# Patient Record
Sex: Male | Born: 1975 | Race: White | Hispanic: No | Smoking: Never smoker
Health system: Southern US, Community
[De-identification: ages and names within clinical notes are randomized; demographics above are authoritative.]

## PROBLEM LIST (undated history)

## (undated) DIAGNOSIS — K760 Fatty (change of) liver, not elsewhere classified: Secondary | ICD-10-CM

## (undated) DIAGNOSIS — G4730 Sleep apnea, unspecified: Secondary | ICD-10-CM

## (undated) DIAGNOSIS — F419 Anxiety disorder, unspecified: Secondary | ICD-10-CM

## (undated) DIAGNOSIS — G43909 Migraine, unspecified, not intractable, without status migrainosus: Secondary | ICD-10-CM

## (undated) DIAGNOSIS — J329 Chronic sinusitis, unspecified: Secondary | ICD-10-CM

## (undated) DIAGNOSIS — K219 Gastro-esophageal reflux disease without esophagitis: Secondary | ICD-10-CM

## (undated) DIAGNOSIS — M19019 Primary osteoarthritis, unspecified shoulder: Secondary | ICD-10-CM

## (undated) DIAGNOSIS — F329 Major depressive disorder, single episode, unspecified: Secondary | ICD-10-CM

## (undated) DIAGNOSIS — I10 Essential (primary) hypertension: Secondary | ICD-10-CM

## (undated) DIAGNOSIS — M7542 Impingement syndrome of left shoulder: Secondary | ICD-10-CM

## (undated) DIAGNOSIS — B019 Varicella without complication: Secondary | ICD-10-CM

## (undated) DIAGNOSIS — F32A Depression, unspecified: Secondary | ICD-10-CM

## (undated) HISTORY — DX: Varicella without complication: B01.9

## (undated) HISTORY — PX: APPENDECTOMY: SHX54

## (undated) HISTORY — PX: HYPOSPADIAS CORRECTION: SHX483

## (undated) HISTORY — DX: Gastro-esophageal reflux disease without esophagitis: K21.9

## (undated) HISTORY — DX: Migraine, unspecified, not intractable, without status migrainosus: G43.909

## (undated) HISTORY — PX: KNEE SURGERY: SHX244

## (undated) HISTORY — DX: Fatty (change of) liver, not elsewhere classified: K76.0

## (undated) HISTORY — PX: CERVICAL LAMINECTOMY: SHX94

## (undated) HISTORY — PX: CLAVICLE EXCISION: SHX597

---

## 2002-01-06 ENCOUNTER — Encounter: Payer: Self-pay | Admitting: Geriatric Medicine

## 2002-01-06 ENCOUNTER — Encounter: Admission: RE | Admit: 2002-01-06 | Discharge: 2002-01-06 | Payer: Self-pay | Admitting: Geriatric Medicine

## 2003-07-19 ENCOUNTER — Observation Stay (HOSPITAL_COMMUNITY): Admission: EM | Admit: 2003-07-19 | Discharge: 2003-07-20 | Payer: Self-pay | Admitting: Emergency Medicine

## 2003-07-19 ENCOUNTER — Encounter (INDEPENDENT_AMBULATORY_CARE_PROVIDER_SITE_OTHER): Payer: Self-pay | Admitting: Specialist

## 2005-10-16 ENCOUNTER — Emergency Department (HOSPITAL_COMMUNITY): Admission: EM | Admit: 2005-10-16 | Discharge: 2005-10-17 | Payer: Self-pay | Admitting: Emergency Medicine

## 2007-01-05 ENCOUNTER — Ambulatory Visit (HOSPITAL_BASED_OUTPATIENT_CLINIC_OR_DEPARTMENT_OTHER): Admission: RE | Admit: 2007-01-05 | Discharge: 2007-01-05 | Payer: Self-pay | Admitting: Orthopedic Surgery

## 2007-03-02 ENCOUNTER — Encounter: Admission: RE | Admit: 2007-03-02 | Discharge: 2007-03-02 | Payer: Self-pay | Admitting: Internal Medicine

## 2008-03-26 ENCOUNTER — Emergency Department (HOSPITAL_COMMUNITY): Admission: EM | Admit: 2008-03-26 | Discharge: 2008-03-26 | Payer: Self-pay | Admitting: Emergency Medicine

## 2009-08-21 ENCOUNTER — Inpatient Hospital Stay (HOSPITAL_COMMUNITY): Admission: EM | Admit: 2009-08-21 | Discharge: 2009-08-23 | Payer: Self-pay | Admitting: Emergency Medicine

## 2010-12-02 LAB — URINE MICROSCOPIC-ADD ON

## 2010-12-02 LAB — URINALYSIS, ROUTINE W REFLEX MICROSCOPIC
Glucose, UA: NEGATIVE mg/dL
Nitrite: NEGATIVE
Specific Gravity, Urine: 1.007 (ref 1.005–1.030)
pH: 6.5 (ref 5.0–8.0)

## 2010-12-02 LAB — CBC
HCT: 35.2 % — ABNORMAL LOW (ref 39.0–52.0)
Hemoglobin: 11.4 g/dL — ABNORMAL LOW (ref 13.0–17.0)
Hemoglobin: 12.1 g/dL — ABNORMAL LOW (ref 13.0–17.0)
Hemoglobin: 13.2 g/dL (ref 13.0–17.0)
MCHC: 33.8 g/dL (ref 30.0–36.0)
MCHC: 34 g/dL (ref 30.0–36.0)
MCHC: 34.8 g/dL (ref 30.0–36.0)
MCV: 88 fL (ref 78.0–100.0)
MCV: 89.1 fL (ref 78.0–100.0)
RBC: 3.77 MIL/uL — ABNORMAL LOW (ref 4.22–5.81)
RBC: 3.99 MIL/uL — ABNORMAL LOW (ref 4.22–5.81)
RBC: 4.43 MIL/uL (ref 4.22–5.81)
RDW: 12.9 % (ref 11.5–15.5)
WBC: 15.7 10*3/uL — ABNORMAL HIGH (ref 4.0–10.5)
WBC: 19.2 10*3/uL — ABNORMAL HIGH (ref 4.0–10.5)

## 2010-12-02 LAB — URINE CULTURE

## 2010-12-02 LAB — COMPREHENSIVE METABOLIC PANEL
ALT: 48 U/L (ref 0–53)
ALT: 55 U/L — ABNORMAL HIGH (ref 0–53)
AST: 26 U/L (ref 0–37)
Alkaline Phosphatase: 55 U/L (ref 39–117)
CO2: 23 mEq/L (ref 19–32)
CO2: 26 mEq/L (ref 19–32)
Calcium: 8.9 mg/dL (ref 8.4–10.5)
Chloride: 101 mEq/L (ref 96–112)
Chloride: 101 mEq/L (ref 96–112)
Creatinine, Ser: 0.78 mg/dL (ref 0.4–1.5)
GFR calc Af Amer: >60 mL/min (ref >60)
GFR calc non Af Amer: >60 mL/min (ref >60)
GFR calc non Af Amer: >60 mL/min (ref >60)
Glucose, Bld: 104 mg/dL — ABNORMAL HIGH (ref 70–99)
Glucose, Bld: 107 mg/dL — ABNORMAL HIGH (ref 70–99)
Potassium: 4 mEq/L (ref 3.5–5.1)
Sodium: 133 mEq/L — ABNORMAL LOW (ref 135–145)
Sodium: 136 mEq/L (ref 135–145)
Total Bilirubin: 0.9 mg/dL (ref 0.3–1.2)
Total Bilirubin: 0.9 mg/dL (ref 0.3–1.2)
Total Protein: 6.5 g/dL (ref 6.0–8.3)

## 2010-12-02 LAB — BASIC METABOLIC PANEL
CO2: 26 mEq/L (ref 19–32)
CO2: 27 mEq/L (ref 19–32)
Calcium: 8.7 mg/dL (ref 8.4–10.5)
Chloride: 102 mEq/L (ref 96–112)
Creatinine, Ser: 0.77 mg/dL (ref 0.4–1.5)
Creatinine, Ser: 0.86 mg/dL (ref 0.4–1.5)
GFR calc Af Amer: >60 mL/min (ref >60)
GFR calc Af Amer: >60 mL/min (ref >60)
Glucose, Bld: 114 mg/dL — ABNORMAL HIGH (ref 70–99)
Glucose, Bld: 97 mg/dL (ref 70–99)

## 2010-12-02 LAB — DIFFERENTIAL
Basophils Absolute: 0 10*3/uL (ref 0.0–0.1)
Basophils Absolute: 0 10*3/uL (ref 0.0–0.1)
Basophils Relative: 0 % (ref 0–1)
Eosinophils Absolute: 0 10*3/uL (ref 0.0–0.7)
Lymphs Abs: 1.7 10*3/uL (ref 0.7–4.0)
Neutro Abs: 16.7 10*3/uL — ABNORMAL HIGH (ref 1.7–7.7)
Neutro Abs: 3.6 10*3/uL (ref 1.7–7.7)
Neutrophils Relative %: 59 % (ref 43–77)

## 2010-12-02 LAB — CULTURE, BLOOD (ROUTINE X 2): Culture: NO GROWTH

## 2010-12-02 LAB — HEMOGLOBIN A1C: Hgb A1c MFr Bld: 5.5 % (ref 4.6–6.1)

## 2011-01-17 NOTE — Op Note (Signed)
NAME:  Joshua Parrish, Joshua Parrish                              ACCOUNT NO.:  [id number]   MEDICAL RECORD NO.:  [id number]                   PATIENT TYPE:  INP   LOCATION:  0105                                 FACILITY:  [facility]   PHYSICIAN:  Currie Paris, M.D.           DATE OF BIRTH:  1975-09-16   DATE OF PROCEDURE:  07/19/2003  DATE OF DISCHARGE:                                 OPERATIVE REPORT   PREOPERATIVE DIAGNOSIS:  Acute appendicitis.   POSTOPERATIVE DIAGNOSIS:  Acute appendicitis.   OPERATION:  Laparoscopic appendectomy.   SURGEON:  Currie Paris, M.D.   ANESTHESIA:  General.   CLINICAL HISTORY:  This patient is Parrish 35 year old with Parrish fairly typical  history for acute appendicitis as well as typical physical findings.   DESCRIPTION OF PROCEDURE:  The patient was brought to the operating room and  after satisfactory general anesthesia had been obtained, the abdomen was  prepped and draped.  An initial attempt at passing Parrish Foley catheter was  unsuccessful because of Parrish little bit of stricture in the distal urethra and  since he had had hypospadias repair, this issue was not pressed any further  and we did not further instrument the urinary tract.  I then, after prepping  and draping, the incisions were anesthetized with 0.25% plain Marcaine.  The  skin was opened at the umbilicus, the fascia identified and opened, and the  peritoneal cavity entered unde direct vision.  Parrish purse-string was placed,  the Hasson introduced, and the abdomen was insufflated to 15.  Parrish 5 mm trocar  was placed in the right upper quadrant and Parrish 10/11 in the left lower  quadrant, both under direct vision.   With the camera in the left lower quadrant, some instruments were used and I  was able to identify the appendix  which was inflamed and there was Parrish little  bit of, perhaps 5-10 mL, of purulent fluid just adjacent to it which was  suctioned out.  The appendix was opened up so the mesentery was on Parrish bit  of  Parrish stretch and the mesentery was divided with the Harmonic scalpel down to  the base of the appendix.  The base of the appendix was divided with the  endo-GIA and placed in Parrish bag.  The appendix was brought out through the  umbilical port.  The umbilical port was placed.  Final irrigation, check for  hemostasis was made and, again, everything appeared to be dry.  The 5 mm  port was removed first and there was no bleeding, the 10/11 in the left  lower quadrant next, and again, no bleeding.  The abdomen was desufflated  through the umbilical port and the purse-string was tied down.  The skin was  closed with 4-0 Monocryl subcuticular and Dermabond.  The patient tolerated  the procedure well.  There were no operative complications.  All counts were  correct.                                               Currie Paris, M.D.   CJS/MEDQ  D:  07/19/2003  T:  07/20/2003  Job:  [id number]   cc:   Jodene Nam C. Charma IgoP.

## 2011-01-17 NOTE — Op Note (Signed)
NAMETAVEON, ENYEART                    ACCOUNT NO.:  [id number]   MEDICAL RECORD NO.:  [id number]          PATIENT TYPE:  AMB   LOCATION:  DSC                          FACILITY:  [facility]   PHYSICIAN:  Katy Fitch. Sypher, M.D. DATE OF BIRTH:  December 28, 1975   DATE OF PROCEDURE:  01/05/2007  DATE OF DISCHARGE:                               OPERATIVE REPORT   PREOPERATIVE DIAGNOSIS:  Chronic pain, right shoulder, with a history of  overhead the lifting and prior history of weight training, with evidence  of advanced acromioclavicular joint arthropathy, with hypertrophic  osteophyte formation on plain x-ray, signs of stage II impingement  clinically, and an MRI documenting inflammation of the acromioclavicular  joint, unfavorable acromioclavicular joint anatomy and reactive edema in  the humeral head, all consistent with stage II impingement.   POSTOPERATIVE DIAGNOSIS:  Chronic pain, right shoulder, with a history  of overhead the lifting and prior history of weight training, with  evidence of advanced acromioclavicular joint arthropathy, with  hypertrophic osteophyte formation on plain x-ray, signs of stage II  impingement clinically, and an MRI documenting inflammation of the  acromioclavicular joint, unfavorable acromioclavicular joint anatomy and  reactive edema in the humeral head, all consistent with stage II  impingement.  Identification of significant arthropathy of the right  acromioclavicular joint with hypertrophic osteophyte formation.   OPERATION:  1. Examination of right shoulder under anesthesia.  2. Diagnostic arthroscopy, right glenohumeral joint, confirming an      intact labrum, capsule ligamentous structures, biceps and articular      surface rotator cuff.  3. Arthroscopic subacromial decompression with bursectomy,      coracoacromial ligament relaxation, anterior and medial      acromioplasty with extensive bursectomy.  4. Open resection of distal clavicle and  acromioclavicular joint      degenerative meniscus.   OPERATING SURGEON:  Katy Fitch. Sypher, MD.  No assistant.   ANESTHESIA:  General endotracheal supplemented by a right interscalene  block, supervising anesthesiologist is Dr. Sampson Goon.   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   He had a history of extensive weight training in the past as well as  other vigorous contact sports.  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   On Parrish examination in the office he was noted to have signs of AC  arthropathy and impingement.  He had weakness of abduction that may have  been pain-induced.  Plain x-rays of the shoulder documented very  unfavorable hypertrophic osteoarthritis of the [facility] joint with inferior  projecting osteophytes at the medial acromion and distal clavicle.  An  MRI of the shoulder was obtained, which revealed tendinosis of the  supraspinatus rotator cuff tendon, no sign of significant labral  pathology, and AC edema.  There was also some edema in the humeral head  consistent with an impingement stress phenomenon.  Due to a failure to respond to rest, activity modification and therapy,  Joshua Parrish was advised to proceed with diagnostic arthroscopy at this time  in order to fully assess his glenohumeral joint, rotator cuff, and to  proceed with subacromial decompression.   Given his overhead work and size, I recommended open distal clavicle  resection so that we may properly repair the trapezius and deltoid  muscles.   After informed consent, he is brought to the operating room at this  time.   Preoperatively he was advised of the potential risks and benefits of  surgery.  He understands the risks include infection, failure to  relieve  all his pain, and a failure to restore his ability to handle highly  physical contact sports.  His benefits anticipated would include pain  relief at the [facility] joint, relief of impingement and improved sleep.   Questions were invited and answered both preoperatively in the office  and in the holding area with his wife and mother present.   PROCEDURE:  Joshua Parrish was brought to the operating room and placed in  supine position on the operating table.   Following an anesthesia consult with Dr. Sampson Goon, an interscalene  block was recommended and placed without complication.  Excellent  anesthesia of the forequarter was obtained.   Joshua Parrish was transferred to room #1, placed in supine position on the  operating table, and under Dr. Jarrett Ables direct supervision, general  endotracheal anesthesia induced.   He was carefully positioned in the beach-chair position with the aid of  a torso and holder with passive compression devices on his calves.   Examination of the shoulder under anesthesia revealed that he had no  sign of instability of the glenohumeral joint to anterior, posterior,  inferior or traction stress.  He had crepitation in the sub-AC region  consistent with chronic bursitis and impingement.  The right arm and  forequarter were then prepped with DuraPrep and he was draped with  impervious arthroscopy drapes.  The shoulder was instrumented through a  standard posterior viewing portal with a blunt trocar.  Diagnostic  arthroscopy revealed intact hyaline articular cartilage surfaces on the  glenoid and humeral head.  The anterior capsule had mild adhesive  capsulitis granulations.  The subscapularis, supraspinatus,  infraspinatus and teres minor tendons had a normal articular side  insertion.  The labrum was intact.  The biceps origin was intact and the  biceps tendon was normal through the rotator interval.  The scope was removed from the glenohumeral joint and  placed in the subacromial space.  There was a florid bursitis noted.  After bursectomy, the anatomy of the  coracoacromial arch was studied.  The coracoacromial ligament was quite  thickened and there was a hypertrophic bursitis noted both on the tendon  and acromial side.  After thorough bursectomy, the coracoacromial  ligament was relaxed with cutting cautery followed by leveling the  acromion to a type 1 morphology with a suction bur.  Hemostasis was  achieved with the bipolar electrocautery device.  The medial acromion  was quite prominent, particularly posteriorly.  This was leveled to a  type 1 morphology with a suction bur.  The capsule of the [facility] joint was  released with cutting cautery posteriorly, inferiorly and anteriorly.  Thereafter, we proceeded to an open distal clavicle resection.  A 2.5 cm  incision was fashioned directly over the distal clavicle.  The  subcutaneous tissues were carefully divided taking care to identify the  capsule of the [facility] joint.  The distal 2 cm of clavicle was exposed  subperiosteally, followed by use of an oscillating saw to remove the  distal clavicle.  There was a substantial posterior inferior osteophyte  that remained that was removed with the oscillating saw, a curette and  rongeur.   After completion of the [facility] resection, the meniscus remnants at the [facility]  joint were removed as well.   The dead space created by the distal clavicle resection was then closed  with a pair of figure-of-eight mattress sutures of #2 FiberWire with  knots buried deep.  The skin wound was then repaired with subdermal  sutures of 0 Vicryl and intradermal 3-0 Prolene with Steri-Strips.  The  scope was replaced in the subacromial space and clot and debris removed.  Care was taken to assure that all capsular remnants were debrided and  hemostasis achieved.  A photographic image of the final decompression  was obtained.   Mr. Burress's wounds were then repaired with mattress  sutures of 3-0  Prolene, followed by Steri-Strips.  A voluminous gauze dressing with ABD  pads and paper tape was applied.   Mr. Chandley was awakened from general anesthesia and transferred to the  recovery room with stable vital signs.   He will be discharged home care of his family with intention to return  to our office for follow-up in 24 hours for x-ray, dressing change, and  initiation of his active assisted range of motion exercise program.    Note:  For aftercare he is provided a prescription for Dilaudid 2 mg one  or two tablets p.o. a.4-6h. p.r.n. pain, 40 tablets without refill, also  Motrin 600 mg one p.o. q.6h. p.r.n. pain, 40 tablets with two refills,  and Keflex 500 mg one p.o. q.8h. x3 days as a prophylactic antibiotic.      Katy Fitch Sypher, M.D.  Electronically Signed     RVS/MEDQ  D:  01/05/2007  T:  01/05/2007  Job:  [id number]

## 2011-01-17 NOTE — H&P (Signed)
NAME:  Joshua Parrish, Joshua Parrish                              ACCOUNT NO.:  [id number]   MEDICAL RECORD NO.:  [id number]                   PATIENT TYPE:  INP   LOCATION:  0451                                 FACILITY:  [facility]   PHYSICIAN:  Currie Paris, M.D.           DATE OF BIRTH:  May 06, 1976   DATE OF ADMISSION:  07/19/2003  DATE OF DISCHARGE:                                HISTORY & PHYSICAL   CHIEF COMPLAINT:  Abdomina pain.   HISTORY OF PRESENT ILLNESS:  Joshua Parrish is a 35 year old, previously healthy  gentleman who presents today with a one day history of abdominal pain.  He  had a little diarrhea last night which he did not think much of, and then  this morning woke up about six with mid abdominal pain which he felt was  indigestion, but it got significantly worse over the day and moved into his  right lower quadrant and has persisted there.  Pain is increased by moving  around, made better by laying still.  He has been nauseated, vomited a  couple times today, but not nauseated currently, but is not hungry either.  He is fairly uncomfortable from the pain.  He has never had any pain similar  to this before.  He has had no other recent GI complaints, nor has he had  any other GI or GU symptoms currently.  He does note that he apparently had  some inflammation of his pancreas about a year ago but did not require  hospitalization.   MEDICATIONS:  None.   ALLERGIES:  1. NEMBUTAL and CHLORAL HYDRATE cause him to act crazy.  2. CODEINE gives him nausea.   PAST SURGICAL HISTORY:  1. He has had knee surgery as a child.  2. Hypospadias repair.   SOCIAL HISTORY:  He does not smoke but does dip, drinks rare alcohol.  He  works as a Contractor.   FAMILY HISTORY:  He has sporadic incidents of cancer and heart disease in  the family.   REVIEW OF SYSTEMS:  HEENT:  Negative.  CHEST:  No cough, shortness of  breath.  HEART:  No history of murmurs or hypertension.  ABDOMEN:   Negative  except for as per HPI.  GENITOURINARY:  Negative except for HPI.  He has no  voiding symptoms following his hypospadias repair.  EXTREMITIES:  Has had  surgeries there but otherwise doing well.   PHYSICAL EXAMINATION:  GENERAL:  The patient is generally alert,  uncomfortable, but not toxic-appearing.  He is overweight.  VITAL SIGNS:  Blood pressure 145/91, pulse 71, respirations 18, temperature  98.7 here.  It was 99.5 in Dr. Newell Coral office.  HEENT:  Head is normocephalic.  Eyes nonicteric.  Pupils equal, round, and  regular. EOMs intact.  Pharynx:  A little bit dry mucous membranes.  NECK:  Supple.  There are no masses or thyromegaly.  LUNGS:  Normal respirations.  Clear to auscultation.  HEART:  Regular rhythm.  No murmurs, rubs, or gallops are heard.  Intact  carotid femoral pulses.  ABDOMEN:  A little distended, markedly tender with guarding in the right  lower quadrant.  Some referred rebound from the left side to the right side.  Bowel sounds are present but hypoactive.  No masses are noted.  No  tenderness or organomegaly are noted.  EXTREMITIES:  Show no cyanosis or edema.  He has good range of motion.  No  significant varicosities are noted.   LABORATORY DATA:  His white count is 10,600 with a marked left shift of 90%  neutrophils.  CMET is basically negative.  Amylase, lipase are normal.   IMPRESSION:  Acute appendicitis.   PLAN:  Discussed the plans with the patient and his family.  I would like to  go ahead with laparoscopy and planned laparoscopic appendectomy this  afternoon or early evening when the operating room is available.  I have  gone over risks and complications and have also talked about the fact that  the diagnosis could be something else and that we may need to do him as an  open procedure.  I think all questions have been answered and will go ahead  and start him on some antibiotics and some pain medications, but he has  already been getting  intravenous fluids.                                               Currie Paris, M.D.    CJS/MEDQ  D:  07/19/2003  T:  07/19/2003  Job:  [id number]

## 2013-04-01 ENCOUNTER — Emergency Department (HOSPITAL_COMMUNITY): Payer: No Typology Code available for payment source

## 2013-04-01 ENCOUNTER — Encounter (HOSPITAL_COMMUNITY): Payer: Self-pay | Admitting: Emergency Medicine

## 2013-04-01 ENCOUNTER — Emergency Department (HOSPITAL_COMMUNITY)
Admission: EM | Admit: 2013-04-01 | Discharge: 2013-04-01 | Disposition: A | Discharge status: Home / Self Care | Payer: No Typology Code available for payment source | Attending: Emergency Medicine | Admitting: Emergency Medicine

## 2013-04-01 DIAGNOSIS — R110 Nausea: Secondary | ICD-10-CM | POA: Insufficient documentation

## 2013-04-01 DIAGNOSIS — T148XXA Other injury of unspecified body region, initial encounter: Secondary | ICD-10-CM

## 2013-04-01 DIAGNOSIS — S139XXA Sprain of joints and ligaments of unspecified parts of neck, initial encounter: Secondary | ICD-10-CM | POA: Insufficient documentation

## 2013-04-01 DIAGNOSIS — S060X0A Concussion without loss of consciousness, initial encounter: Secondary | ICD-10-CM | POA: Insufficient documentation

## 2013-04-01 DIAGNOSIS — Y92410 Unspecified street and highway as the place of occurrence of the external cause: Secondary | ICD-10-CM | POA: Insufficient documentation

## 2013-04-01 DIAGNOSIS — Y9389 Activity, other specified: Secondary | ICD-10-CM | POA: Insufficient documentation

## 2013-04-01 MED ORDER — HYDROCODONE-ACETAMINOPHEN 5-325 MG PO TABS: 1.0000 | ORAL_TABLET | Freq: Four times a day (QID) | ORAL | Status: DC | PRN

## 2013-04-01 MED ORDER — ONDANSETRON 4 MG PO TBDP
4.0000 mg | ORAL_TABLET | Freq: Once | ORAL | Status: AC
  Administered 2013-04-01: 4 mg via ORAL
  Filled 2013-04-01: qty 1

## 2013-04-01 MED ORDER — CYCLOBENZAPRINE HCL 10 MG PO TABS: 10.0000 mg | ORAL_TABLET | Freq: Two times a day (BID) | ORAL | Status: DC | PRN
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
## 2013-04-01 NOTE — ED Notes (Signed)
MD at bedside. 

## 2013-04-01 NOTE — Progress Notes (Signed)
Patient cofirms he does not have a pcp.  Patient also reports he has Advertising copywriter for [employer]  Instructed patient to call the number of the back of his insurance card or go under insurance company website to find a pcp who is within network.  Patient verbalized understanding.

## 2013-04-01 NOTE — ED Notes (Signed)
Patient transported to CT 
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
## 2013-08-18 ENCOUNTER — Other Ambulatory Visit: Payer: Self-pay | Admitting: Orthopedic Surgery

## 2013-08-18 DIAGNOSIS — M25512 Pain in left shoulder: Secondary | ICD-10-CM

## 2013-08-31 ENCOUNTER — Other Ambulatory Visit: Payer: Self-pay

## 2014-12-18 ENCOUNTER — Emergency Department (HOSPITAL_COMMUNITY)
Admission: EM | Admit: 2014-12-18 | Discharge: 2014-12-18 | Disposition: A | Discharge status: Home / Self Care | Payer: 59 | Attending: Emergency Medicine | Admitting: Emergency Medicine

## 2014-12-18 ENCOUNTER — Encounter (HOSPITAL_COMMUNITY): Payer: Self-pay | Admitting: Emergency Medicine

## 2014-12-18 DIAGNOSIS — M5412 Radiculopathy, cervical region: Secondary | ICD-10-CM | POA: Diagnosis not present

## 2014-12-18 DIAGNOSIS — M62838 Other muscle spasm: Secondary | ICD-10-CM | POA: Insufficient documentation

## 2014-12-18 DIAGNOSIS — M436 Torticollis: Secondary | ICD-10-CM | POA: Diagnosis not present

## 2014-12-18 DIAGNOSIS — M79602 Pain in left arm: Secondary | ICD-10-CM | POA: Diagnosis present

## 2014-12-18 DIAGNOSIS — Z79899 Other long term (current) drug therapy: Secondary | ICD-10-CM | POA: Insufficient documentation

## 2014-12-18 MED ORDER — HYDROMORPHONE HCL 1 MG/ML IJ SOLN
1.0000 mg | Freq: Once | INTRAMUSCULAR | Status: AC
  Administered 2014-12-18: 1 mg via INTRAMUSCULAR
  Filled 2014-12-18: qty 1

## 2014-12-18 MED ORDER — KETOROLAC TROMETHAMINE 60 MG/2ML IM SOLN
30.0000 mg | Freq: Once | INTRAMUSCULAR | Status: AC
  Administered 2014-12-18: 30 mg via INTRAMUSCULAR
  Filled 2014-12-18: qty 2

## 2014-12-18 MED ORDER — DIAZEPAM 5 MG PO TABS: 5.0000 mg | ORAL_TABLET | Freq: Four times a day (QID) | ORAL | Status: DC | PRN

## 2014-12-18 NOTE — ED Provider Notes (Addendum)
CSN: [id number]     Arrival date & time 12/18/14  0048 History   First MD Initiated Contact with Patient 12/18/14 0100     Chief Complaint  Patient presents with  . Arm Pain     (Consider location/radiation/quality/duration/timing/severity/associated sxs/prior Treatment) Patient is a 39 y.o. male presenting with neck injury.  Neck Injury This is a new problem. The current episode started less than 1 hour ago. The problem occurs constantly. The problem has been gradually worsening. Pertinent negatives include no chest pain, no abdominal pain, no headaches and no shortness of breath. Exacerbated by: movement of neck and palpation. Nothing relieves the symptoms. He has tried nothing for the symptoms.    History reviewed. No pertinent past medical history. Past Surgical History  Procedure Laterality Date  . Clavicle excision    . Appendectomy    . Knee surgery     Family History  Problem Relation Age of Onset  . Cancer Other   . Hypertension Other   . Diabetes Other    History  Substance Use Topics  . Smoking status: Never Smoker   . Smokeless tobacco: Current User    Types: Chew  . Alcohol Use: Yes     Comment: occasional    Review of Systems  Respiratory: Negative for shortness of breath.   Cardiovascular: Negative for chest pain.  Gastrointestinal: Negative for abdominal pain.  Neurological: Negative for headaches.  All other systems reviewed and are negative.     Allergies  Review of patient's allergies indicates no known allergies.  Home Medications   Prior to Admission medications   Medication Sig Start Date End Date Taking? Authorizing Provider  ibuprofen (ADVIL,MOTRIN) 200 MG tablet Take 800 mg by mouth every 6 (six) hours as needed for moderate pain.   Yes Historical Provider, MD  cyclobenzaprine (FLEXERIL) 10 MG tablet Take 1 tablet (10 mg total) by mouth 2 (two) times daily as needed for muscle spasms. Patient not taking: Reported on 12/18/2014 04/01/13    Blanchie Dessert, MD  diazepam (VALIUM) 5 MG tablet Take 1 tablet (5 mg total) by mouth every 6 (six) hours as needed (spasms). 12/18/14   Debby Freiberg, MD  HYDROcodone-acetaminophen (NORCO/VICODIN) 5-325 MG per tablet Take 1 tablet by mouth every 6 (six) hours as needed for pain. Patient not taking: Reported on 12/18/2014 04/01/13   Blanchie Dessert, MD   BP 162/94 mmHg  Pulse 90  Temp(Src) 97.8 F (36.6 C) (Oral)  Resp 18  SpO2 99% Physical Exam  Constitutional: He is oriented to person, place, and time. He appears well-developed and well-nourished.  HENT:  Head: Normocephalic and atraumatic.  Eyes: Conjunctivae and EOM are normal.  Neck: Normal range of motion. Neck supple.  Cardiovascular: Normal rate, regular rhythm and normal heart sounds.   Pulmonary/Chest: Effort normal and breath sounds normal. No respiratory distress.  Abdominal: He exhibits no distension. There is no tenderness. There is no rebound and no guarding.  Musculoskeletal:       Cervical back: He exhibits decreased range of motion (2/2 pain), tenderness and spasm (L). He exhibits no bony tenderness.  Neurological: He is alert and oriented to person, place, and time.  Strength 5/5 bil ue, sensation subj diminished, objectively intact  Skin: Skin is warm and dry.  Vitals reviewed.   ED Course  Procedures (including critical care time) Labs Review Labs Reviewed - No data to display  Imaging Review No results found.   EKG Interpretation None      MDM  Final diagnoses:  Cervical radiculopathy  Torticollis    39 y.o. male with pertinent PMH of 2 months of L sided neck pain and progressive weakness on that side presents with acute on chronic L neck pain radiating to L arm after lifting boxes.  No objective neuro deficit, symptoms and exam consistent with torticollis and cervical radiculopathy.  No indication for imaging.  DC home in stable condition to fu with previously scheduled orthopedist appointment  (also does spine).    I have reviewed all laboratory and imaging studies if ordered as above  1. Cervical radiculopathy   2. Torticollis          Debby Freiberg, MD 12/18/14 [phone]

## 2014-12-18 NOTE — Discharge Instructions (Signed)

## 2014-12-18 NOTE — ED Notes (Signed)
Pt is c/o left arm pain  Pt states the pain is in the left side of his neck, shoulder, and arm  Pt states it hurts to raise his arm

## 2015-04-02 ENCOUNTER — Other Ambulatory Visit: Payer: Self-pay | Admitting: Orthopedic Surgery

## 2015-04-02 DIAGNOSIS — M25511 Pain in right shoulder: Secondary | ICD-10-CM

## 2015-04-18 ENCOUNTER — Ambulatory Visit
Admission: RE | Admit: 2015-04-18 | Discharge: 2015-04-18 | Disposition: A | Discharge status: Home / Self Care | Payer: 59 | Source: Ambulatory Visit | Attending: Orthopedic Surgery | Admitting: Orthopedic Surgery

## 2015-04-18 ENCOUNTER — Other Ambulatory Visit: Payer: Self-pay | Admitting: Orthopedic Surgery

## 2015-04-18 DIAGNOSIS — M25512 Pain in left shoulder: Secondary | ICD-10-CM

## 2015-04-18 DIAGNOSIS — M25511 Pain in right shoulder: Secondary | ICD-10-CM

## 2015-04-18 MED ORDER — IOHEXOL 180 MG/ML  SOLN
13.0000 mL | Freq: Once | INTRAMUSCULAR | Status: DC | PRN
  Administered 2015-04-18: 13 mL via INTRAVENOUS

## 2015-06-19 ENCOUNTER — Encounter (HOSPITAL_BASED_OUTPATIENT_CLINIC_OR_DEPARTMENT_OTHER): Payer: Self-pay | Admitting: Physician Assistant

## 2015-06-19 DIAGNOSIS — M19019 Primary osteoarthritis, unspecified shoulder: Secondary | ICD-10-CM | POA: Diagnosis present

## 2015-06-19 DIAGNOSIS — M7542 Impingement syndrome of left shoulder: Secondary | ICD-10-CM

## 2015-06-19 HISTORY — DX: Impingement syndrome of left shoulder: M75.42

## 2015-06-19 NOTE — H&P (Signed)
Joshua Parrish is an 39 y.o. male.   Chief Complaint: LEFT SHOULDER PAIN HPI: Joshua Parrish is a 39 year old who is seen for a follow-up evaluation for his persistent left shoulder pain. This has been bothering him for the past year. He underwent an MRI arthrogram for the left shoulder that showed rotator cuff tendinitis with impingement. He has significant AC joint spurring noted on X-rays.  We injected his shoulder with minimal relief and placed him on a Sterapred Dosepak with minimal relief. He has been to physical therapy with minimal relief.   No past medical history on file.  Past Surgical History  Procedure Laterality Date  . Clavicle excision    . Appendectomy    . Knee surgery      Family History  Problem Relation Age of Onset  . Cancer Other   . Hypertension Other   . Diabetes Other    Social History:  reports that he has never smoked. His smokeless tobacco use includes Chew. He reports that he drinks alcohol. He reports that he does not use illicit drugs.  Allergies: No Known Allergies  No current facility-administered medications for this encounter.  Current outpatient prescriptions:  .  cyclobenzaprine (FLEXERIL) 10 MG tablet, Take 1 tablet (10 mg total) by mouth 2 (two) times daily as needed for muscle spasms. (Patient not taking: Reported on 12/18/2014), Disp: 20 tablet, Rfl: 0 .  diazepam (VALIUM) 5 MG tablet, Take 1 tablet (5 mg total) by mouth every 6 (six) hours as needed (spasms)., Disp: 20 tablet, Rfl: 0 .  HYDROcodone-acetaminophen (NORCO/VICODIN) 5-325 MG per tablet, Take 1 tablet by mouth every 6 (six) hours as needed for pain. (Patient not taking: Reported on 12/18/2014), Disp: 10 tablet, Rfl: 0 .  ibuprofen (ADVIL,MOTRIN) 200 MG tablet, Take 800 mg by mouth every 6 (six) hours as needed for moderate pain., Disp: , Rfl:    Review of Systems  Constitutional: Negative.   HENT: Negative.   Eyes: Negative.   Respiratory: Negative.   Cardiovascular: Negative.    Gastrointestinal: Negative.   Genitourinary: Negative.   Musculoskeletal: Positive for joint pain.       LEFT SHOULDER PAIN  Skin: Negative.   Neurological: Negative.   Endo/Heme/Allergies: Negative.   Psychiatric/Behavioral: Negative.     There were no vitals taken for this visit. Physical Exam  Constitutional: He is oriented to person, place, and time. He appears well-developed and well-nourished.  HENT:  Head: Normocephalic and atraumatic.  Mouth/Throat: Oropharynx is clear and moist.  Eyes: Conjunctivae are normal. Pupils are equal, round, and reactive to light.  Neck: Neck supple.  Cardiovascular: Normal rate.   Respiratory: Effort normal.  GI: Soft.  Genitourinary:  Not pertinent to current symptomatology therefore not examined.  Musculoskeletal:  Examination of his left shoulder reveals a full range of motion with pain. There is a positive impingement. There is pain on rotator cuff stressing with minimal weakness. Positive O'Brien's test.  Examination of his right shoulder reveals a full range of motion without pain, swelling, weakness or instability. Vascular exam: pulses   Neurological: He is alert and oriented to person, place, and time.  Skin: Skin is warm and dry.  Psychiatric: He has a normal mood and affect. His behavior is normal.     Assessment Principal Problem:   Rotator cuff impingement syndrome of left shoulder Active Problems:   Acromioclavicular joint arthritis   Plan At this point due to his failed conservative care, we would recommend we proceed  with a left shoulder arthroscopy with a subacromial decompression and distal clavicle excision. The risks, complications and benefits of the surgery have been described to him in detail and he understands this completely.   Sully Manzi J 06/19/2015, 9:33 AM

## 2015-06-20 ENCOUNTER — Encounter (HOSPITAL_BASED_OUTPATIENT_CLINIC_OR_DEPARTMENT_OTHER): Payer: Self-pay | Admitting: *Deleted

## 2015-06-26 ENCOUNTER — Ambulatory Visit (HOSPITAL_BASED_OUTPATIENT_CLINIC_OR_DEPARTMENT_OTHER): Payer: 59 | Admitting: Certified Registered"

## 2015-06-26 ENCOUNTER — Encounter (HOSPITAL_BASED_OUTPATIENT_CLINIC_OR_DEPARTMENT_OTHER)
Admission: RE | Disposition: A | Discharge status: Home / Self Care | Payer: Self-pay | Source: Ambulatory Visit | Attending: Orthopedic Surgery

## 2015-06-26 ENCOUNTER — Ambulatory Visit (HOSPITAL_BASED_OUTPATIENT_CLINIC_OR_DEPARTMENT_OTHER)
Admission: RE | Admit: 2015-06-26 | Discharge: 2015-06-26 | Disposition: A | Discharge status: Home / Self Care | Payer: 59 | Source: Ambulatory Visit | Attending: Orthopedic Surgery | Admitting: Orthopedic Surgery

## 2015-06-26 ENCOUNTER — Encounter (HOSPITAL_BASED_OUTPATIENT_CLINIC_OR_DEPARTMENT_OTHER): Payer: Self-pay

## 2015-06-26 DIAGNOSIS — M75112 Incomplete rotator cuff tear or rupture of left shoulder, not specified as traumatic: Secondary | ICD-10-CM | POA: Diagnosis not present

## 2015-06-26 DIAGNOSIS — M19012 Primary osteoarthritis, left shoulder: Secondary | ICD-10-CM | POA: Diagnosis not present

## 2015-06-26 DIAGNOSIS — M19019 Primary osteoarthritis, unspecified shoulder: Secondary | ICD-10-CM | POA: Diagnosis present

## 2015-06-26 DIAGNOSIS — Z720 Tobacco use: Secondary | ICD-10-CM | POA: Insufficient documentation

## 2015-06-26 DIAGNOSIS — M7542 Impingement syndrome of left shoulder: Secondary | ICD-10-CM | POA: Diagnosis present

## 2015-06-26 HISTORY — DX: Depression, unspecified: F32.A

## 2015-06-26 HISTORY — DX: Major depressive disorder, single episode, unspecified: F32.9

## 2015-06-26 HISTORY — DX: Primary osteoarthritis, unspecified shoulder: M19.019

## 2015-06-26 HISTORY — DX: Impingement syndrome of left shoulder: M75.42

## 2015-06-26 HISTORY — PX: SHOULDER ARTHROSCOPY WITH ROTATOR CUFF REPAIR: SHX5685

## 2015-06-26 SURGERY — SHOULDER ARTHROSCOPY WITH SUBACROMIAL DECOMPRESSION AND DISTAL CLAVICLE EXCISION
Anesthesia: General | Site: Shoulder | Laterality: Left

## 2015-06-26 MED ORDER — OXYCODONE HCL 5 MG PO TABS: ORAL_TABLET | ORAL | Status: DC

## 2015-06-26 MED ORDER — EPINEPHRINE HCL 1 MG/ML IJ SOLN
INTRAMUSCULAR | Status: AC
  Filled 2015-06-26: qty 1

## 2015-06-26 MED ORDER — BUPIVACAINE-EPINEPHRINE (PF) 0.25% -1:200000 IJ SOLN
INTRAMUSCULAR | Status: AC
  Filled 2015-06-26: qty 30

## 2015-06-26 MED ORDER — MIDAZOLAM HCL 2 MG/2ML IJ SOLN
INTRAMUSCULAR | Status: AC
  Filled 2015-06-26: qty 2

## 2015-06-26 MED ORDER — SCOPOLAMINE 1 MG/3DAYS TD PT72: 1.0000 | MEDICATED_PATCH | Freq: Once | TRANSDERMAL | Status: DC | PRN

## 2015-06-26 MED ORDER — LIDOCAINE HCL (CARDIAC) 20 MG/ML IV SOLN
INTRAVENOUS | Status: AC
  Filled 2015-06-26: qty 5

## 2015-06-26 MED ORDER — SODIUM CHLORIDE 0.9 % IR SOLN
Status: DC | PRN
  Administered 2015-06-26: 11000 mL

## 2015-06-26 MED ORDER — ARTIFICIAL TEARS OP OINT
TOPICAL_OINTMENT | OPHTHALMIC | Status: AC
  Filled 2015-06-26: qty 3.5

## 2015-06-26 MED ORDER — PROPOFOL 500 MG/50ML IV EMUL
INTRAVENOUS | Status: AC
  Filled 2015-06-26: qty 50

## 2015-06-26 MED ORDER — SUCCINYLCHOLINE CHLORIDE 20 MG/ML IJ SOLN
INTRAMUSCULAR | Status: AC
  Filled 2015-06-26: qty 1

## 2015-06-26 MED ORDER — ONDANSETRON HCL 4 MG/2ML IJ SOLN
INTRAMUSCULAR | Status: DC | PRN
  Administered 2015-06-26: 4 mg via INTRAVENOUS

## 2015-06-26 MED ORDER — SUCCINYLCHOLINE CHLORIDE 20 MG/ML IJ SOLN
INTRAMUSCULAR | Status: DC | PRN
  Administered 2015-06-26: 140 mg via INTRAVENOUS

## 2015-06-26 MED ORDER — ONDANSETRON HCL 4 MG/2ML IJ SOLN: 4.0000 mg | Freq: Once | INTRAMUSCULAR | Status: DC | PRN

## 2015-06-26 MED ORDER — LACTATED RINGERS IV SOLN
INTRAVENOUS | Status: DC
  Administered 2015-06-26 (×2): via INTRAVENOUS

## 2015-06-26 MED ORDER — MEPERIDINE HCL 25 MG/ML IJ SOLN: 6.2500 mg | INTRAMUSCULAR | Status: DC | PRN

## 2015-06-26 MED ORDER — MIDAZOLAM HCL 2 MG/2ML IJ SOLN
1.0000 mg | INTRAMUSCULAR | Status: DC | PRN
  Administered 2015-06-26: 2 mg via INTRAVENOUS

## 2015-06-26 MED ORDER — GLYCOPYRROLATE 0.2 MG/ML IJ SOLN: 0.2000 mg | Freq: Once | INTRAMUSCULAR | Status: DC | PRN

## 2015-06-26 MED ORDER — LIDOCAINE HCL 4 % MT SOLN
OROMUCOSAL | Status: DC | PRN
  Administered 2015-06-26: 2 mL via TOPICAL

## 2015-06-26 MED ORDER — FENTANYL CITRATE (PF) 100 MCG/2ML IJ SOLN
50.0000 ug | INTRAMUSCULAR | Status: DC | PRN
  Administered 2015-06-26: 100 ug via INTRAVENOUS

## 2015-06-26 MED ORDER — BUPIVACAINE-EPINEPHRINE (PF) 0.5% -1:200000 IJ SOLN
INTRAMUSCULAR | Status: DC | PRN
  Administered 2015-06-26: 30 mL via PERINEURAL

## 2015-06-26 MED ORDER — HYDROMORPHONE HCL 1 MG/ML IJ SOLN: 0.2500 mg | INTRAMUSCULAR | Status: DC | PRN

## 2015-06-26 MED ORDER — FENTANYL CITRATE (PF) 100 MCG/2ML IJ SOLN
INTRAMUSCULAR | Status: AC
  Filled 2015-06-26: qty 2

## 2015-06-26 MED ORDER — PROPOFOL 10 MG/ML IV BOLUS
INTRAVENOUS | Status: DC | PRN
  Administered 2015-06-26: 200 mg via INTRAVENOUS

## 2015-06-26 MED ORDER — DEXAMETHASONE SODIUM PHOSPHATE 4 MG/ML IJ SOLN
INTRAMUSCULAR | Status: DC | PRN
  Administered 2015-06-26: 10 mg via INTRAVENOUS

## 2015-06-26 MED ORDER — DIAZEPAM 5 MG PO TABS: 5.0000 mg | ORAL_TABLET | Freq: Four times a day (QID) | ORAL | Status: DC | PRN

## 2015-06-26 MED ORDER — LIDOCAINE HCL (CARDIAC) 20 MG/ML IV SOLN
INTRAVENOUS | Status: DC | PRN
  Administered 2015-06-26: 100 mg via INTRAVENOUS

## 2015-06-26 MED ORDER — CHLORHEXIDINE GLUCONATE 4 % EX LIQD: 60.0000 mL | Freq: Once | CUTANEOUS | Status: DC

## 2015-06-26 MED ORDER — DEXTROSE 5 % IV SOLN
3.0000 g | INTRAVENOUS | Status: AC
  Administered 2015-06-26: 2 g via INTRAVENOUS

## 2015-06-26 MED ORDER — ONDANSETRON HCL 4 MG/2ML IJ SOLN
INTRAMUSCULAR | Status: AC
  Filled 2015-06-26: qty 2

## 2015-06-26 SURGICAL SUPPLY — 77 items
ANCH SUT SWLK 19.1X4.75 (Anchor) ×1 IMPLANT
ANCHOR SUT BIO SW 4.75X19.1 (Anchor) ×1 IMPLANT
APL SKNCLS STERI-STRIP NONHPOA (GAUZE/BANDAGES/DRESSINGS)
BENZOIN TINCTURE PRP APPL 2/3 (GAUZE/BANDAGES/DRESSINGS) IMPLANT
BLADE CUDA 5.5 (BLADE) IMPLANT
BLADE CUTTER GATOR 3.5 (BLADE) ×2 IMPLANT
BLADE GREAT WHITE 4.2 (BLADE) IMPLANT
BLADE SURG 11 STRL SS (BLADE) ×1 IMPLANT
BNDG COHESIVE 4X5 TAN STRL (GAUZE/BANDAGES/DRESSINGS) ×1 IMPLANT
BNDG COHESIVE 6X5 TAN STRL LF (GAUZE/BANDAGES/DRESSINGS) ×1 IMPLANT
BUR OVAL 6.0 (BURR) ×2 IMPLANT
CANNULA TWIST IN 8.25X7CM (CANNULA) ×2 IMPLANT
DECANTER SPIKE VIAL GLASS SM (MISCELLANEOUS) IMPLANT
DRAPE SHOULDER BEACH CHAIR (DRAPES) ×2 IMPLANT
DRAPE SURG 17X23 STRL (DRAPES) ×1 IMPLANT
DRAPE U-SHAPE 47X51 STRL (DRAPES) ×4 IMPLANT
DRSG PAD ABDOMINAL 8X10 ST (GAUZE/BANDAGES/DRESSINGS) ×2 IMPLANT
DURAPREP 26ML APPLICATOR (WOUND CARE) ×2 IMPLANT
ELECT REM PT RETURN 9FT ADLT (ELECTROSURGICAL)
ELECTRODE REM PT RTRN 9FT ADLT (ELECTROSURGICAL) ×1 IMPLANT
GAUZE SPONGE 4X4 12PLY STRL (GAUZE/BANDAGES/DRESSINGS) ×2 IMPLANT
GAUZE XEROFORM 1X8 LF (GAUZE/BANDAGES/DRESSINGS) ×2 IMPLANT
GLOVE BIO SURGEON STRL SZ7 (GLOVE) ×1 IMPLANT
GLOVE BIOGEL PI IND STRL 7.0 (GLOVE) IMPLANT
GLOVE BIOGEL PI IND STRL 7.5 (GLOVE) ×1 IMPLANT
GLOVE BIOGEL PI INDICATOR 7.0 (GLOVE) ×3
GLOVE BIOGEL PI INDICATOR 7.5 (GLOVE) ×1
GLOVE ECLIPSE 6.5 STRL STRAW (GLOVE) ×1 IMPLANT
GLOVE SS BIOGEL STRL SZ 7.5 (GLOVE) ×1 IMPLANT
GLOVE SUPERSENSE BIOGEL SZ 7.5 (GLOVE) ×1
GOWN STRL REUS W/ TWL LRG LVL3 (GOWN DISPOSABLE) ×3 IMPLANT
GOWN STRL REUS W/TWL LRG LVL3 (GOWN DISPOSABLE) ×6
IV NS IRRIG 3000ML ARTHROMATIC (IV SOLUTION) ×4 IMPLANT
LOOP 2 FIBERLINK CLOSED (SUTURE) ×1 IMPLANT
MANIFOLD NEPTUNE II (INSTRUMENTS) ×2 IMPLANT
NDL 1/2 CIR CATGUT .05X1.09 (NEEDLE) IMPLANT
NDL SAFETY ECLIPSE 18X1.5 (NEEDLE) ×1 IMPLANT
NDL SCORPION MULTI FIRE (NEEDLE) IMPLANT
NDL SUT 6 .5 CRC .975X.05 MAYO (NEEDLE) IMPLANT
NEEDLE 1/2 CIR CATGUT .05X1.09 (NEEDLE) IMPLANT
NEEDLE HYPO 18GX1.5 SHARP (NEEDLE) ×2
NEEDLE MAYO TAPER (NEEDLE)
NEEDLE SCORPION MULTI FIRE (NEEDLE) ×2 IMPLANT
PACK ARTHROSCOPY DSU (CUSTOM PROCEDURE TRAY) ×2 IMPLANT
PACK BASIN DAY SURGERY FS (CUSTOM PROCEDURE TRAY) ×2 IMPLANT
PAD ALCOHOL SWAB (MISCELLANEOUS) ×3 IMPLANT
PENCIL BUTTON HOLSTER BLD 10FT (ELECTRODE) IMPLANT
SET ARTHROSCOPY TUBING (MISCELLANEOUS) ×2
SET ARTHROSCOPY TUBING LN (MISCELLANEOUS) ×1 IMPLANT
SHEET MEDIUM DRAPE 40X70 STRL (DRAPES) IMPLANT
SLEEVE SCD COMPRESS KNEE MED (MISCELLANEOUS) ×1 IMPLANT
SLING ARM FOAM STRAP LRG (SOFTGOODS) IMPLANT
SLING ARM IMMOBILIZER MED (SOFTGOODS) IMPLANT
SLING ARM MED ADULT FOAM STRAP (SOFTGOODS) IMPLANT
SLING ARM XL FOAM STRAP (SOFTGOODS) ×1 IMPLANT
SLING ULTRA III MED (ORTHOPEDIC SUPPLIES) IMPLANT
SPONGE LAP 4X18 X RAY DECT (DISPOSABLE) IMPLANT
STRIP CLOSURE SKIN 1/2X4 (GAUZE/BANDAGES/DRESSINGS) IMPLANT
SUT ETHILON 3 0 PS 1 (SUTURE) ×2 IMPLANT
SUT FIBERWIRE #2 38 T-5 BLUE (SUTURE)
SUT PDS AB 2-0 CT2 27 (SUTURE) IMPLANT
SUT PROLENE 3 0 PS 2 (SUTURE) IMPLANT
SUT TIGER TAPE 7 IN WHITE (SUTURE) ×1 IMPLANT
SUT VIC AB 0 SH 27 (SUTURE) IMPLANT
SUT VIC AB 2-0 PS2 27 (SUTURE) IMPLANT
SUT VIC AB 2-0 SH 27 (SUTURE)
SUT VIC AB 2-0 SH 27XBRD (SUTURE) IMPLANT
SUTURE FIBERWR #2 38 T-5 BLUE (SUTURE) IMPLANT
SYR 5ML LL (SYRINGE) ×2 IMPLANT
SYR BULB 3OZ (MISCELLANEOUS) IMPLANT
TAPE FIBER 2MM 7IN #2 BLUE (SUTURE) IMPLANT
TAPE HYPAFIX 6X30 (GAUZE/BANDAGES/DRESSINGS) IMPLANT
TAPE STRIPS DRAPE STRL (GAUZE/BANDAGES/DRESSINGS) ×2 IMPLANT
TOWEL OR 17X24 6PK STRL BLUE (TOWEL DISPOSABLE) ×2 IMPLANT
TUBE CONNECTING 20X1/4 (TUBING) ×1 IMPLANT
WAND STAR VAC 90 (SURGICAL WAND) ×2 IMPLANT
WATER STERILE IRR 1000ML POUR (IV SOLUTION) ×2 IMPLANT

## 2015-06-26 NOTE — Discharge Instructions (Signed)
°  Post Anesthesia Home Care Instructions ° °Activity: °Get plenty of rest for the remainder of the day. A responsible adult should stay with you for 24 hours following the procedure.  °For the next 24 hours, DO NOT: °-Drive a car °-Operate machinery °-Drink alcoholic beverages °-Take any medication unless instructed by your physician °-Make any legal decisions or sign important papers. ° °Meals: °Start with liquid foods such as gelatin or soup. Progress to regular foods as tolerated. Avoid greasy, spicy, heavy foods. If nausea and/or vomiting occur, drink only clear liquids until the nausea and/or vomiting subsides. Call your physician if vomiting continues. ° °Special Instructions/Symptoms: °Your throat may feel dry or sore from the anesthesia or the breathing tube placed in your throat during surgery. If this causes discomfort, gargle with warm salt water. The discomfort should disappear within 24 hours. ° °If you had a scopolamine patch placed behind your ear for the management of post- operative nausea and/or vomiting: ° °1. The medication in the patch is effective for 72 hours, after which it should be removed.  Wrap patch in a tissue and discard in the trash. Wash hands thoroughly with soap and water. °2. You may remove the patch earlier than 72 hours if you experience unpleasant side effects which may include dry mouth, dizziness or visual disturbances. °3. Avoid touching the patch. Wash your hands with soap and water after contact with the patch. °  °Regional Anesthesia Blocks ° °1. Numbness or the inability to move the "blocked" extremity may last from 3-48 hours after placement. The length of time depends on the medication injected and your individual response to the medication. If the numbness is not going away after 48 hours, call your surgeon. ° °2. The extremity that is blocked will need to be protected until the numbness is gone and the  Strength has returned. Because you cannot feel it, you will need  to take extra care to avoid injury. Because it may be weak, you may have difficulty moving it or using it. You may not know what position it is in without looking at it while the block is in effect. ° °3. For blocks in the legs and feet, returning to weight bearing and walking needs to be done carefully. You will need to wait until the numbness is entirely gone and the strength has returned. You should be able to move your leg and foot normally before you try and bear weight or walk. You will need someone to be with you when you first try to ensure you do not fall and possibly risk injury. ° °4. Bruising and tenderness at the needle site are common side effects and will resolve in a few days. ° °5. Persistent numbness or new problems with movement should be communicated to the surgeon or the [facility] ([phone])/ [facility] ([phone]). °

## 2015-06-26 NOTE — Interval H&P Note (Signed)
History and Physical Interval Note:  06/26/2015 7:55 AM  Joshua Parrish  has presented today for surgery, with the diagnosis of LEFT SHOULDER,PRIMARY OSTEOARTHRITIS,LEFT SHOULDER,BURSITIS OF RIGHT SHOULDER  The various methods of treatment have been discussed with the patient and family. After consideration of risks, benefits and other options for treatment, the patient has consented to  Procedure(s): LEFT SHOULDER ARTHROSCOPY DEBRIDEMENT, ACROMIOPLASY, DISTAL CLAVICLE EXCISION (Left) as a surgical intervention .  The patient's history has been reviewed, patient examined, no change in status, stable for surgery.  I have reviewed the patient's chart and labs.  Questions were answered to the patient's satisfaction.     Elsie Saas A

## 2015-06-26 NOTE — Transfer of Care (Signed)
Immediate Anesthesia Transfer of Care Note  Patient: Joshua Parrish  Procedure(s) Performed: Procedure(s): LEFT SHOULDER ARTHROSCOPY DEBRIDEMENT, ACROMIOPLASY, DISTAL CLAVICLE EXCISION, ARTHROSCOPIC ROTATOR CUFF REPAIR (Left) SHOULDER ARTHROSCOPY WITH ROTATOR CUFF REPAIR (Left)  Patient Location: PACU  Anesthesia Type:GA combined with regional for post-op pain  Level of Consciousness: awake, alert  and oriented  Airway & Oxygen Therapy: Patient Spontanous Breathing and Patient connected to face mask oxygen  Post-op Assessment: Report given to RN, Post -op Vital signs reviewed and stable and Patient moving all extremities  Post vital signs: Reviewed and stable  Last Vitals:  Filed Vitals:   06/26/15 1330  BP:   Pulse:   Temp: 36.6 C  Resp:     Complications: No apparent anesthesia complications

## 2015-06-26 NOTE — Anesthesia Postprocedure Evaluation (Signed)
Anesthesia Post Note  Patient: Joshua Parrish  Procedure(s) Performed: Procedure(s) (LRB): LEFT SHOULDER ARTHROSCOPY DEBRIDEMENT, ACROMIOPLASY, DISTAL CLAVICLE EXCISION, ARTHROSCOPIC ROTATOR CUFF REPAIR (Left) SHOULDER ARTHROSCOPY WITH ROTATOR CUFF REPAIR (Left)  Anesthesia type: general  Patient location: PACU  Post pain: Pain level controlled  Post assessment: Patient's Cardiovascular Status Stable  Last Vitals:  Filed Vitals:   06/26/15 1400  BP: 127/81  Pulse: 76  Temp:   Resp: 15    Post vital signs: Reviewed and stable  Level of consciousness: sedated  Complications: No apparent anesthesia complications

## 2015-06-26 NOTE — Progress Notes (Signed)
Assisted Dr. Ossey with left, ultrasound guided, supraclavicular block. Side rails up, monitors on throughout procedure. See vital signs in flow sheet. Tolerated Procedure well. 

## 2015-06-26 NOTE — Anesthesia Procedure Notes (Addendum)
Anesthesia Regional Block:  Interscalene brachial plexus block  Pre-Anesthetic Checklist: ,, timeout performed, Correct Patient, Correct Site, Correct Laterality, Correct Procedure, Correct Position, site marked, Risks and benefits discussed,  Surgical consent,  Pre-op evaluation,  At surgeon's request and post-op pain management  Laterality: Left  Prep: chloraprep       Needles:  Injection technique: Single-shot  Needle Type: Echogenic Stimulator Needle     Needle Length: 9cm 9 cm Needle Gauge: 21 and 21 G    Additional Needles:  Procedures: ultrasound guided (picture in chart) and nerve stimulator Interscalene brachial plexus block  Nerve Stimulator or Paresthesia:  Response: 0.4 mA,   Additional Responses:   Narrative:  Start time: 06/26/2015 11:05 AM End time: 06/26/2015 11:15 AM Anesthesiologist: Lillia Abed  Additional Notes: Monitors applied. Patient sedated. Sterile prep and drape,hand hygiene and sterile gloves were used. Relevant anatomy identified.Needle position confirmed.Local anesthetic injected incrementally after negative aspiration. Local anesthetic spread visualized around nerve(s). Vascular puncture avoided. No complications. Image printed for medical record.The patient tolerated the procedure well.        Procedure Name: Intubation Date/Time: 06/26/2015 12:04 PM Performed by: Baxter Flattery Pre-anesthesia Checklist: Patient identified, Emergency Drugs available, Suction available and Patient being monitored Patient Re-evaluated:Patient Re-evaluated prior to inductionOxygen Delivery Method: Circle System Utilized Preoxygenation: Pre-oxygenation with 100% oxygen Intubation Type: IV induction Ventilation: Mask ventilation without difficulty Grade View: Grade I Tube type: Oral Tube size: 7.0 mm Number of attempts: 1 Airway Equipment and Method: Stylet and LTA kit utilized Placement Confirmation: ETT inserted through vocal cords under direct  vision,  positive ETCO2 and breath sounds checked- equal and bilateral Secured at: 23 cm Tube secured with: Tape Dental Injury: Teeth and Oropharynx as per pre-operative assessment

## 2015-06-26 NOTE — Anesthesia Preprocedure Evaluation (Signed)
Anesthesia Evaluation  Patient identified by MRN, date of birth, ID band Patient awake    Reviewed: Allergy & Precautions, NPO status , Patient's Chart, lab work & pertinent test results  Airway Mallampati: I  TM Distance: >3 FB Neck ROM: Full    Dental   Pulmonary    Pulmonary exam normal        Cardiovascular Normal cardiovascular exam     Neuro/Psych Depression    GI/Hepatic   Endo/Other    Renal/GU      Musculoskeletal   Abdominal   Peds  Hematology   Anesthesia Other Findings   Reproductive/Obstetrics                             Anesthesia Physical Anesthesia Plan  ASA: II  Anesthesia Plan: General   Post-op Pain Management: GA combined w/ Regional for post-op pain   Induction: Intravenous  Airway Management Planned: Oral ETT  Additional Equipment:   Intra-op Plan:   Post-operative Plan: Extubation in OR  Informed Consent: I have reviewed the patients History and Physical, chart, labs and discussed the procedure including the risks, benefits and alternatives for the proposed anesthesia with the patient or authorized representative who has indicated his/her understanding and acceptance.     Plan Discussed with: CRNA and Surgeon  Anesthesia Plan Comments:         Anesthesia Quick Evaluation

## 2015-06-27 ENCOUNTER — Encounter (HOSPITAL_BASED_OUTPATIENT_CLINIC_OR_DEPARTMENT_OTHER): Payer: Self-pay | Admitting: Orthopedic Surgery

## 2015-06-27 NOTE — Op Note (Signed)
Joshua Parrish, Joshua Parrish                    ACCOUNT NO.:  [id number]  MEDICAL RECORD NO.:  [id number]  LOCATION:                               FACILITY:  [facility]  PHYSICIAN:  Anaisabel Pederson A. Noemi Chapel, M.D. DATE OF BIRTH:  February 04, 1976  DATE OF PROCEDURE:  06/26/2015 DATE OF DISCHARGE:  06/26/2015                              OPERATIVE REPORT   PREOPERATIVE DIAGNOSES: 1. Left shoulder chronic non-traumatic rotator cuff tear. 2. Left shoulder chronic non-traumatic partial labrum tear. 3. Left shoulder chronic non-traumatic impingement. 4. Left shoulder acromioclavicular (AC) joint, primary localized     osteoarthritis.  POSTOPERATIVE DIAGNOSES: 1. Left shoulder chronic non-traumatic rotator cuff tear. 2. Left shoulder chronic non-traumatic partial labrum tear. 3. Left shoulder chronic non-traumatic impingement. 4. Left shoulder acromioclavicular (AC) joint, primary localized     osteoarthritis.  PROCEDURE: 1. Left shoulder EUA followed by arthroscopically assisted rotator     cuff repair, using Arthrex, Swivel lock anchor and fiber tape x1. 2. Left shoulder partial labrum tear debridement. 3. Left shoulder subacromial decompression. 4. Left shoulder distal clavicle excision.  SURGEON:  Audree Camel. Noemi Chapel, M.D.  ASSISTANT:  Matthew Saras, PA.  ANESTHESIA:  General.  OPERATIVE TIME:  1 hour.  COMPLICATIONS:  None.  INDICATION FOR PROCEDURE:  Joshua Parrish is a 39 year old gentleman, who has had over a year left shoulder pain with exam and MRI documenting rotator cuff tendonitis versus a partial tear with partial labrum tear with impingement and AC joint osteoarthritis.  He has failed conservative care and is now to undergo arthroscopy.  DESCRIPTION OF PROCEDURE:  Joshua Parrish was brought to the operating room on June 26, 2015, after an interscalene block was placed in the holding room by Anesthesia.  He was placed on the operative table in supine position.  He received antibiotics preoperatively  for prophylaxis. After being placed under general anesthesia, his left shoulder was examined.  Range of motion was 90% of normal.  His shoulder was stable. He was then placed in beach chair position and his shoulder and arm was prepped using sterile DuraPrep and draped using sterile technique.  Time- out procedure was called and the correct left shoulder identified. Initially, through a posterior arthroscopic portal, the arthroscope with a pump attached was placed into an anterior portal.  An arthroscopic probe was placed.  On initial inspection, the articular cartilage in the glenohumeral joint was intact.  He had partial tearing of the posterior labrum, 25% of which was debrided.  The anterior labrum and anterior- inferior glenohumeral ligament complex was intact.  Biceps tendon anchor and biceps tendon was intact.  Rotator cuff showed a partial tear of the subscapularis 20%, which was debrided.  The articular surface of the rest of the rotator cuff was intact, but on the bursal surface, there was a large flap of a tear of the supraspinatus, which was unstable. The inferior capsular recess free of pathology.  Subacromial space was entered and a lateral arthroscopic portal was made.  Large amount of bursitis was resected.  Impingement was noted with the acromion digging into the rotator cuff and the subacromial decompression was carried out removing 6-8 mm of the undersurface  of the anterior, anterolateral, anteromedial acromion, and CA ligament release carried out as well.  The [facility] joint showed significant spurring and degenerative changes and synovitis and the distal 8-10 mm of clavicle was resected with a 6 mm bur.  At this point, the rotator cuff tear was repaired primarily with 1 Arthrex swivel lock anchor and fiber tape and a fiber link.  The 12 o'clock anchor was deployed in the lateral aspect of the greater tuberosity with excellent fixation.  This completely repaired the  tear back down to its anatomic position.  Shoulder could be brought through full range of motion with no impingement on the repair.  At this point, it was felt that all pathology had been satisfactorily addressed.  The instruments were removed.  Portals closed with 3-0 nylon suture. Sterile dressings and a sling applied and the patient awakened and taken to recovery room in stable condition.  FOLLOWUP CARE:  Joshua Parrish will be followed as an outpatient on oxycodone and Valium with early physical therapy.  He will be seen back in the office in a week for sutures out and followup.     Auria Mckinlay A. Noemi Chapel, M.D.     RAW/MEDQ  D:  06/26/2015  T:  06/27/2015  Job:  [id number]

## 2015-07-23 ENCOUNTER — Encounter (HOSPITAL_COMMUNITY): Payer: Self-pay | Admitting: *Deleted

## 2015-07-23 ENCOUNTER — Emergency Department (HOSPITAL_COMMUNITY): Payer: Commercial Managed Care - HMO

## 2015-07-23 ENCOUNTER — Emergency Department (HOSPITAL_COMMUNITY)
Admission: EM | Admit: 2015-07-23 | Discharge: 2015-07-23 | Disposition: A | Discharge status: Home / Self Care | Payer: Commercial Managed Care - HMO | Attending: Emergency Medicine | Admitting: Emergency Medicine

## 2015-07-23 DIAGNOSIS — Z79891 Long term (current) use of opiate analgesic: Secondary | ICD-10-CM | POA: Insufficient documentation

## 2015-07-23 DIAGNOSIS — Z8659 Personal history of other mental and behavioral disorders: Secondary | ICD-10-CM | POA: Diagnosis not present

## 2015-07-23 DIAGNOSIS — Z79899 Other long term (current) drug therapy: Secondary | ICD-10-CM | POA: Insufficient documentation

## 2015-07-23 DIAGNOSIS — M19012 Primary osteoarthritis, left shoulder: Secondary | ICD-10-CM | POA: Insufficient documentation

## 2015-07-23 DIAGNOSIS — R1084 Generalized abdominal pain: Secondary | ICD-10-CM | POA: Insufficient documentation

## 2015-07-23 DIAGNOSIS — R110 Nausea: Secondary | ICD-10-CM | POA: Diagnosis not present

## 2015-07-23 DIAGNOSIS — R1010 Upper abdominal pain, unspecified: Secondary | ICD-10-CM | POA: Diagnosis present

## 2015-07-23 LAB — I-STAT CG4 LACTIC ACID, ED: Lactic Acid, Venous: 2.26 mmol/L (ref 0.5–2.0)

## 2015-07-23 LAB — BASIC METABOLIC PANEL
Anion gap: 9 (ref 5–15)
BUN: 10 mg/dL (ref 6–20)
CHLORIDE: 104 mmol/L (ref 101–111)
CO2: 24 mmol/L (ref 22–32)
Calcium: 9.5 mg/dL (ref 8.9–10.3)
Creatinine, Ser: 0.91 mg/dL (ref 0.61–1.24)
GFR calc non Af Amer: >60 mL/min (ref >60)
Glucose, Bld: 106 mg/dL — ABNORMAL HIGH (ref 65–99)
POTASSIUM: 3.8 mmol/L (ref 3.5–5.1)
SODIUM: 137 mmol/L (ref 135–145)

## 2015-07-23 LAB — CBC
HEMATOCRIT: 41.7 % (ref 39.0–52.0)
HEMOGLOBIN: 14.4 g/dL (ref 13.0–17.0)
MCH: 30.7 pg (ref 26.0–34.0)
MCHC: 34.5 g/dL (ref 30.0–36.0)
MCV: 88.9 fL (ref 78.0–100.0)
PLATELETS: 268 10*3/uL (ref 150–400)
RBC: 4.69 MIL/uL (ref 4.22–5.81)
RDW: 12.4 % (ref 11.5–15.5)
WBC: 8 10*3/uL (ref 4.0–10.5)

## 2015-07-23 LAB — HEPATIC FUNCTION PANEL
ALT: 27 U/L (ref 17–63)
AST: 21 U/L (ref 15–41)
Albumin: 4.1 g/dL (ref 3.5–5.0)
Alkaline Phosphatase: 58 U/L (ref 38–126)
Bilirubin, Direct: 0.1 mg/dL (ref 0.1–0.5)
Indirect Bilirubin: 0.3 mg/dL (ref 0.3–0.9)
Total Bilirubin: 0.4 mg/dL (ref 0.3–1.2)
Total Protein: 6.9 g/dL (ref 6.5–8.1)

## 2015-07-23 LAB — URINALYSIS, ROUTINE W REFLEX MICROSCOPIC
Bilirubin Urine: NEGATIVE
Glucose, UA: NEGATIVE mg/dL
Ketones, ur: NEGATIVE mg/dL
Leukocytes, UA: NEGATIVE
Nitrite: NEGATIVE
Protein, ur: NEGATIVE mg/dL
Specific Gravity, Urine: 1.015 (ref 1.005–1.030)
pH: 6 (ref 5.0–8.0)

## 2015-07-23 LAB — URINE MICROSCOPIC-ADD ON: RBC / HPF: NONE SEEN RBC/hpf (ref 0–5)

## 2015-07-23 LAB — I-STAT TROPONIN, ED: Troponin i, poc: 0 ng/mL (ref 0.00–0.08)

## 2015-07-23 LAB — D-DIMER, QUANTITATIVE (NOT AT ARMC): D-Dimer, Quant: 0.6 ug/mL-FEU — ABNORMAL HIGH (ref 0.00–0.50)

## 2015-07-23 LAB — LIPASE, BLOOD: Lipase: 43 U/L (ref 11–51)

## 2015-07-23 MED ORDER — ONDANSETRON HCL 4 MG/2ML IJ SOLN
4.0000 mg | Freq: Once | INTRAMUSCULAR | Status: AC
  Administered 2015-07-23: 4 mg via INTRAVENOUS
  Filled 2015-07-23: qty 2

## 2015-07-23 MED ORDER — HYDROMORPHONE HCL 1 MG/ML IJ SOLN
1.0000 mg | Freq: Once | INTRAMUSCULAR | Status: AC
  Administered 2015-07-23: 1 mg via INTRAVENOUS
  Filled 2015-07-23: qty 1

## 2015-07-23 MED ORDER — HYDROMORPHONE HCL 1 MG/ML IJ SOLN
1.0000 mg | Freq: Once | INTRAMUSCULAR | Status: AC
Start: 2015-07-23 — End: 2015-07-23
  Administered 2015-07-23: 1 mg via INTRAVENOUS
  Filled 2015-07-23: qty 1

## 2015-07-23 MED ORDER — IOHEXOL 350 MG/ML SOLN
100.0000 mL | Freq: Once | INTRAVENOUS | Status: AC | PRN
  Administered 2015-07-23: 100 mL via INTRAVENOUS

## 2015-07-23 MED ORDER — SODIUM CHLORIDE 0.9 % IV BOLUS (SEPSIS)
1000.0000 mL | Freq: Once | INTRAVENOUS | Status: AC
  Administered 2015-07-23: 1000 mL via INTRAVENOUS

## 2015-07-23 MED ORDER — DIAZEPAM 5 MG PO TABS: 5.0000 mg | ORAL_TABLET | Freq: Three times a day (TID) | ORAL | Status: DC | PRN

## 2015-07-23 MED ORDER — BARIUM SULFATE 2.1 % PO SUSP
ORAL | Status: AC
  Filled 2015-07-23: qty 1

## 2015-07-23 NOTE — ED Provider Notes (Signed)
CSN: [id number]     Arrival date & time 07/23/15  [id number] History   First MD Initiated Contact with Patient 07/23/15 [phone]     Chief Complaint  Patient presents with  . Chest Pain     (Consider location/radiation/quality/duration/timing/severity/associated sxs/prior Treatment) HPI Patient presents to the emergency department with waves of abdominal pain that radiated to his chest.  The patient states that these started around 2 AM he has bilateral upper abdominal pain and then this radiates to the left chest.  He states that he has some mild nausea with burping.  He denies shortness of breath, headache, blurred vision, weakness, dizziness, back pain, neck pain, fever, cough, dysuria, incontinence, lightheadedness, near syncope or syncope.  The patient states that the source of pain seems to be more his abdomen rather than chest.  He states that when the waves.  The pain comes in the pain is very intense and he feels like it is hard to take a deep breath during these episodes.  The patient has had recent surgery on his rotator cuff and before that he had cervical spine surgery.  Patient states he has not had any calf tenderness or leg swelling Past Medical History  Diagnosis Date  . Rotator cuff impingement syndrome of left shoulder 06/19/2015  . Depression   . Osteoarthritis of shoulder     left   Past Surgical History  Procedure Laterality Date  . Clavicle excision    . Appendectomy    . Knee surgery    . Cervical laminectomy    . Hypospadias correction      as child  . Shoulder arthroscopy with rotator cuff repair Left 06/26/2015    Procedure: SHOULDER ARTHROSCOPY WITH ROTATOR CUFF REPAIR;  Surgeon: Elsie Saas, MD;  Location: [facility];  Service: Orthopedics;  Laterality: Left;   Family History  Problem Relation Age of Onset  . Cancer Other   . Hypertension Other   . Diabetes Other    Social History  Substance Use Topics  . Smoking status: Never Smoker   .  Smokeless tobacco: Current User    Types: Chew  . Alcohol Use: Yes     Comment: occasional    Review of Systems All other systems negative except as documented in the HPI. All pertinent positives and negatives as reviewed in the HPI.  Allergies  Review of patient's allergies indicates no known allergies.  Home Medications   Prior to Admission medications   Medication Sig Start Date End Date Taking? Authorizing Provider  Amino Acids (AMINO ACID PO) Take 1 scoop by mouth daily.   Yes Historical Provider, MD  diazepam (VALIUM) 5 MG tablet Take 1 tablet (5 mg total) by mouth every 6 (six) hours as needed (spasms). 06/26/15  Yes [name], PA-C  oxyCODONE (ROXICODONE) 5 MG immediate release tablet 1-2 tablets every 4-6 hrs as needed for pain 06/26/15  Yes [name], PA-C   BP 142/99 mmHg  Pulse 70  Temp(Src) 98.4 F (36.9 C) (Oral)  Resp 18  Ht 5\' 8"  (1.727 m)  Wt 105.235 kg  BMI 35.28 kg/m2  SpO2 100% Physical Exam  Constitutional: He is oriented to person, place, and time. He appears well-developed and well-nourished. No distress.  HENT:  Head: Normocephalic and atraumatic.  Mouth/Throat: Oropharynx is clear and moist.  Eyes: Pupils are equal, round, and reactive to light.  Neck: Normal range of motion. Neck supple.  Cardiovascular: Normal rate, regular rhythm and normal heart sounds.  Exam  reveals no gallop and no friction rub.   No murmur heard. Pulmonary/Chest: Effort normal and breath sounds normal. No respiratory distress. He has no wheezes.  Abdominal: Soft. Bowel sounds are normal. He exhibits no distension. There is tenderness. There is guarding. There is no rebound.  Neurological: He is alert and oriented to person, place, and time. He exhibits normal muscle tone. Coordination normal.  Skin: Skin is warm and dry. No rash noted. No erythema.  Psychiatric: He has a normal mood and affect. His behavior is normal.  Nursing note and vitals  reviewed.   ED Course  Procedures (including critical care time) Labs Review Labs Reviewed  BASIC METABOLIC PANEL - Abnormal; Notable for the following:    Glucose, Bld 106 (*)    All other components within normal limits  URINALYSIS, ROUTINE W REFLEX MICROSCOPIC (NOT AT [facility]) - Abnormal; Notable for the following:    Hgb urine dipstick TRACE (*)    All other components within normal limits  D-DIMER, QUANTITATIVE (NOT AT [facility]) - Abnormal; Notable for the following:    D-Dimer, Quant 0.60 (*)    All other components within normal limits  URINE MICROSCOPIC-ADD ON - Abnormal; Notable for the following:    Squamous Epithelial / LPF 0-5 (*)    Bacteria, UA RARE (*)    All other components within normal limits  I-STAT CG4 LACTIC ACID, ED - Abnormal; Notable for the following:    Lactic Acid, Venous 2.26 (*)    All other components within normal limits  CBC  LIPASE, BLOOD  HEPATIC FUNCTION PANEL  I-STAT TROPOININ, ED  Randolm Idol, ED    Imaging Review Dg Chest 2 View  07/23/2015  CLINICAL DATA:  Chest pain and shortness of breath since this morning. EXAM: CHEST  2 VIEW COMPARISON:  08/21/2009 FINDINGS: Both lungs are clear without airspace disease or pulmonary edema. Surgical plate in the lower cervical spine. Heart and mediastinum are within normal limits. The trachea is midline. Chronic shortening of the lateral right clavicle may represent postsurgical changes. IMPRESSION: No active cardiopulmonary disease. Electronically Signed   By: Markus Daft M.D.   On: 07/23/2015 08:27   Ct Angio Chest Pe W/cm &/or Wo Cm  07/23/2015  CLINICAL DATA:  39 year old with epigastric pain that radiates into the chest. EXAM: CT ANGIOGRAPHY CHEST WITH CONTRAST TECHNIQUE: Multidetector CT imaging of the chest was performed using the standard protocol during bolus administration of intravenous contrast. Multiplanar CT image reconstructions and MIPs were obtained to evaluate the vascular anatomy.  CONTRAST:  120mL OMNIPAQUE IOHEXOL 350 MG/ML SOLN COMPARISON:  Chest radiograph 07/23/2015 FINDINGS: Mediastinum/Lymph Nodes: Opacification of the pulmonary arteries is not optimal but no evidence to suggest a large or central pulmonary embolism. No significant pericardial fluid. No evidence for chest lymphadenopathy. Lungs/Pleura: Trachea and mainstem bronchi are patent. Few scattered ground-glass densities are nonspecific and may represent atelectasis. There is no significant airspace disease or consolidation. Upper abdomen: No acute findings. Musculoskeletal: Anterior surgical plate in the lower cervical spine. No acute bone abnormality. Review of the MIP images confirms the above findings. IMPRESSION: No evidence for a large or central pulmonary embolism as described. No acute chest findings. Electronically Signed   By: Markus Daft M.D.   On: 07/23/2015 13:18   Ct Abdomen Pelvis W Contrast  07/23/2015  CLINICAL DATA:  39 year old with epigastric pain that radiates into the chest. EXAM: CT ANGIOGRAPHY CHEST WITH CONTRAST TECHNIQUE: Multidetector CT imaging of the chest was performed using the standard  protocol during bolus administration of intravenous contrast. Multiplanar CT image reconstructions and MIPs were obtained to evaluate the vascular anatomy. CONTRAST:  118mL OMNIPAQUE IOHEXOL 350 MG/ML SOLN COMPARISON:  Chest radiograph 07/23/2015 FINDINGS: Mediastinum/Lymph Nodes: Opacification of the pulmonary arteries is not optimal but no evidence to suggest a large or central pulmonary embolism. No significant pericardial fluid. No evidence for chest lymphadenopathy. Lungs/Pleura: Trachea and mainstem bronchi are patent. Few scattered ground-glass densities are nonspecific and may represent atelectasis. There is no significant airspace disease or consolidation. Upper abdomen: No acute findings. Musculoskeletal: Anterior surgical plate in the lower cervical spine. No acute bone abnormality. Review of the MIP  images confirms the above findings. IMPRESSION: No evidence for a large or central pulmonary embolism as described. No acute chest findings. Electronically Signed   By: Markus Daft M.D.   On: 07/23/2015 13:18   I have personally reviewed and evaluated these images and lab results as part of my medical decision-making.   EKG Interpretation None        Concern was the patient was having some intra-abdominal pathology that was causing his pain, but also had recent surgery and there was concern for PE as well.  Scan was obtained of his chest and abdomen that did not show any significant abnormalities.  Patient is feeling better following pain medications.  He is advised to follow-up with his primary care doctor is advised to return here as needed.  Patient was given the results and all questions were answered.  He agrees to the plan  Dalia Heading, PA-C 07/24/15 [place], DO 07/24/15 [phone]

## 2015-07-23 NOTE — ED Notes (Signed)
PT was awoken from sleep at 2 am with sharp bil flank pain that radiated to L chest.  Pt denies sob but c/o mild nausea and burping.

## 2015-07-23 NOTE — Discharge Instructions (Signed)
Return here as needed. Follow up with your doctor. INcrease your fluid intake °

## 2015-07-23 NOTE — ED Notes (Signed)
Pt states arm was bumped in CT and requesting pain meds.

## 2015-07-26 ENCOUNTER — Emergency Department (HOSPITAL_COMMUNITY)
Admission: EM | Admit: 2015-07-26 | Discharge: 2015-07-26 | Disposition: A | Discharge status: Home / Self Care | Payer: Commercial Managed Care - HMO | Source: Home / Self Care | Attending: Emergency Medicine | Admitting: Emergency Medicine

## 2015-07-26 ENCOUNTER — Encounter (HOSPITAL_COMMUNITY): Payer: Self-pay

## 2015-07-26 ENCOUNTER — Emergency Department (HOSPITAL_COMMUNITY): Payer: Commercial Managed Care - HMO

## 2015-07-26 ENCOUNTER — Emergency Department (HOSPITAL_COMMUNITY)
Admission: EM | Admit: 2015-07-26 | Discharge: 2015-07-26 | Disposition: A | Discharge status: Home / Self Care | Payer: Commercial Managed Care - HMO | Attending: Emergency Medicine | Admitting: Emergency Medicine

## 2015-07-26 ENCOUNTER — Encounter (HOSPITAL_COMMUNITY): Payer: Self-pay | Admitting: Emergency Medicine

## 2015-07-26 DIAGNOSIS — Z79899 Other long term (current) drug therapy: Secondary | ICD-10-CM

## 2015-07-26 DIAGNOSIS — R079 Chest pain, unspecified: Secondary | ICD-10-CM | POA: Insufficient documentation

## 2015-07-26 DIAGNOSIS — Z8739 Personal history of other diseases of the musculoskeletal system and connective tissue: Secondary | ICD-10-CM | POA: Insufficient documentation

## 2015-07-26 DIAGNOSIS — M19019 Primary osteoarthritis, unspecified shoulder: Secondary | ICD-10-CM | POA: Insufficient documentation

## 2015-07-26 DIAGNOSIS — F329 Major depressive disorder, single episode, unspecified: Secondary | ICD-10-CM

## 2015-07-26 DIAGNOSIS — R1011 Right upper quadrant pain: Secondary | ICD-10-CM | POA: Insufficient documentation

## 2015-07-26 DIAGNOSIS — Z9049 Acquired absence of other specified parts of digestive tract: Secondary | ICD-10-CM | POA: Diagnosis not present

## 2015-07-26 DIAGNOSIS — Z8659 Personal history of other mental and behavioral disorders: Secondary | ICD-10-CM | POA: Diagnosis not present

## 2015-07-26 DIAGNOSIS — R1013 Epigastric pain: Secondary | ICD-10-CM

## 2015-07-26 LAB — BASIC METABOLIC PANEL
Anion gap: 7 (ref 5–15)
BUN: 11 mg/dL (ref 6–20)
CALCIUM: 9.5 mg/dL (ref 8.9–10.3)
CO2: 25 mmol/L (ref 22–32)
Chloride: 104 mmol/L (ref 101–111)
Creatinine, Ser: 0.98 mg/dL (ref 0.61–1.24)
GFR calc Af Amer: >60 mL/min (ref >60)
GLUCOSE: 97 mg/dL (ref 65–99)
Potassium: 4.4 mmol/L (ref 3.5–5.1)
Sodium: 136 mmol/L (ref 135–145)

## 2015-07-26 LAB — HEPATIC FUNCTION PANEL
ALK PHOS: 73 U/L (ref 38–126)
ALT: 52 U/L (ref 17–63)
AST: 62 U/L — ABNORMAL HIGH (ref 15–41)
Albumin: 4.1 g/dL (ref 3.5–5.0)
BILIRUBIN INDIRECT: 0.7 mg/dL (ref 0.3–0.9)
BILIRUBIN TOTAL: 1 mg/dL (ref 0.3–1.2)
Bilirubin, Direct: 0.3 mg/dL (ref 0.1–0.5)
TOTAL PROTEIN: 6.8 g/dL (ref 6.5–8.1)

## 2015-07-26 LAB — CBC
HCT: 44.5 % (ref 39.0–52.0)
Hemoglobin: 15.3 g/dL (ref 13.0–17.0)
MCH: 30.7 pg (ref 26.0–34.0)
MCHC: 34.4 g/dL (ref 30.0–36.0)
MCV: 89.4 fL (ref 78.0–100.0)
PLATELETS: 283 10*3/uL (ref 150–400)
RBC: 4.98 MIL/uL (ref 4.22–5.81)
RDW: 12.4 % (ref 11.5–15.5)
WBC: 6.3 10*3/uL (ref 4.0–10.5)

## 2015-07-26 LAB — I-STAT TROPONIN, ED
TROPONIN I, POC: 0 ng/mL (ref 0.00–0.08)
Troponin i, poc: 0.02 ng/mL (ref 0.00–0.08)

## 2015-07-26 LAB — LIPASE, BLOOD: Lipase: 42 U/L (ref 11–51)

## 2015-07-26 MED ORDER — FAMOTIDINE 20 MG PO TABS: 20.0000 mg | ORAL_TABLET | Freq: Every day | ORAL | Status: DC

## 2015-07-26 MED ORDER — FAMOTIDINE 20 MG PO TABS
20.0000 mg | ORAL_TABLET | Freq: Once | ORAL | Status: AC
  Administered 2015-07-26: 20 mg via ORAL
  Filled 2015-07-26: qty 1

## 2015-07-26 MED ORDER — HYDROCODONE-ACETAMINOPHEN 5-325 MG PO TABS
1.0000 | ORAL_TABLET | Freq: Four times a day (QID) | ORAL | Status: DC | PRN
Start: 2015-07-26 — End: 2015-09-05

## 2015-07-26 MED ORDER — PANTOPRAZOLE SODIUM 40 MG PO TBEC
40.0000 mg | DELAYED_RELEASE_TABLET | Freq: Once | ORAL | Status: AC
  Administered 2015-07-26: 40 mg via ORAL
  Filled 2015-07-26: qty 1

## 2015-07-26 MED ORDER — GI COCKTAIL ~~LOC~~
30.0000 mL | Freq: Once | ORAL | Status: AC
  Administered 2015-07-26: 30 mL via ORAL
  Filled 2015-07-26: qty 30

## 2015-07-26 MED ORDER — PANTOPRAZOLE SODIUM 40 MG PO TBEC: 40.0000 mg | DELAYED_RELEASE_TABLET | Freq: Two times a day (BID) | ORAL | Status: DC

## 2015-07-26 MED ORDER — KETOROLAC TROMETHAMINE 30 MG/ML IJ SOLN
30.0000 mg | Freq: Once | INTRAMUSCULAR | Status: AC
  Administered 2015-07-26: 30 mg via INTRAVENOUS
  Filled 2015-07-26: qty 1

## 2015-07-26 MED ORDER — OMEPRAZOLE 20 MG PO CPDR: 20.0000 mg | DELAYED_RELEASE_CAPSULE | Freq: Every day | ORAL | Status: DC

## 2015-07-26 MED ORDER — FENTANYL CITRATE (PF) 100 MCG/2ML IJ SOLN
100.0000 ug | Freq: Once | INTRAMUSCULAR | Status: AC
  Administered 2015-07-26: 100 ug via INTRAVENOUS
  Filled 2015-07-26: qty 2

## 2015-07-26 NOTE — ED Provider Notes (Signed)
CSN: [id number]     Arrival date & time 07/26/15  1647 History   First MD Initiated Contact with Patient 07/26/15 1818     Chief Complaint  Patient presents with  . Chest Pain  . Shortness of Breath     (Consider location/radiation/quality/duration/timing/severity/associated sxs/prior Treatment) HPI   39 year old male with lower sternal/epigastric pain. Recurrent. Pain is sharp. Sometimes associated with shortness of breath, but not consistently. Patient has been evaluated in the emergency room several times the same complaint and told that "it's not my heart." Patient was started on a PPI but is likely taking this daily insulin been on it for a few days. Pain is not any worse postprandial, but he does report early satiety. Denies significant NSAID use. Occasional alcohol.  Past Medical History  Diagnosis Date  . Rotator cuff impingement syndrome of left shoulder 06/19/2015  . Depression   . Osteoarthritis of shoulder     left   Past Surgical History  Procedure Laterality Date  . Clavicle excision    . Appendectomy    . Knee surgery    . Cervical laminectomy    . Hypospadias correction      as child  . Shoulder arthroscopy with rotator cuff repair Left 06/26/2015    Procedure: SHOULDER ARTHROSCOPY WITH ROTATOR CUFF REPAIR;  Surgeon: Elsie Saas, MD;  Location: [facility];  Service: Orthopedics;  Laterality: Left;   Family History  Problem Relation Age of Onset  . Cancer Other   . Hypertension Other   . Diabetes Other    Social History  Substance Use Topics  . Smoking status: Never Smoker   . Smokeless tobacco: Current User    Types: Chew  . Alcohol Use: Yes     Comment: occasional    Review of Systems  All systems reviewed and negative, other than as noted in HPI.   Allergies  Review of patient's allergies indicates no known allergies.  Home Medications   Prior to Admission medications   Medication Sig Start Date End Date Taking?  Authorizing Provider  Amino Acids (AMINO ACID PO) Take 1 scoop by mouth daily.   Yes Historical Provider, MD  diazepam (VALIUM) 5 MG tablet Take 1 tablet (5 mg total) by mouth every 8 (eight) hours as needed for anxiety. 07/23/15  Yes Christopher Lawyer, PA-C  gabapentin (NEURONTIN) 300 MG capsule Take 300 mg by mouth 3 (three) times daily as needed. Nerve pain 05/28/15  Yes Historical Provider, MD  HYDROcodone-acetaminophen (NORCO/VICODIN) 5-325 MG tablet Take 1 tablet by mouth every 6 (six) hours as needed. Patient taking differently: Take 1 tablet by mouth every 6 (six) hours as needed for moderate pain.  07/26/15  Yes [name], MD  omeprazole (PRILOSEC) 20 MG capsule Take 1 capsule (20 mg total) by mouth daily. 07/26/15  Yes [name], MD  oxyCODONE (ROXICODONE) 5 MG immediate release tablet 1-2 tablets every 4-6 hrs as needed for pain 06/26/15  Yes [name], PA-C   BP 161/91 mmHg  Pulse 89  Temp(Src) 97.7 F (36.5 C) (Oral)  Resp 18  SpO2 99% Physical Exam  Constitutional: He is oriented to person, place, and time. He appears well-developed and well-nourished. No distress.  HENT:  Head: Normocephalic and atraumatic.  Eyes: Conjunctivae are normal. Right eye exhibits no discharge. Left eye exhibits no discharge.  Neck: Neck supple.  Cardiovascular: Normal rate, regular rhythm and normal heart sounds.  Exam reveals no gallop and no friction rub.   No murmur  heard. Pulmonary/Chest: Effort normal and breath sounds normal. No respiratory distress.  Abdominal: Soft. He exhibits no distension. There is no tenderness.  Musculoskeletal: He exhibits no edema or tenderness.  Lower extremities symmetric as compared to each other. No calf tenderness. Negative Homan's. No palpable cords.   Neurological: He is alert and oriented to person, place, and time.  Skin: Skin is warm and dry.  Psychiatric: He has a normal mood and affect. His behavior is normal. Thought content  normal.  Nursing note and vitals reviewed.   ED Course  Procedures (including critical care time) Labs Review Labs Reviewed  Randolm Idol, ED    Imaging Review Dg Chest 2 View  07/26/2015  CLINICAL DATA:  Acute onset of generalized chest pain and shortness of breath. Initial encounter. EXAM: CHEST  2 VIEW COMPARISON:  Chest radiograph and CTA of the chest performed 07/23/2015 FINDINGS: The lungs are well-aerated. Minimal bibasilar atelectasis is noted. There is no evidence of pleural effusion or pneumothorax. The heart is normal in size; the mediastinal contour is within normal limits. No acute osseous abnormalities are seen. Cervical spinal fusion hardware is noted. IMPRESSION: Minimal bibasilar atelectasis noted.  Lungs otherwise clear Electronically Signed   By: [name] M.D.   On: 07/26/2015 03:03   US Abdomen Limited  07/26/2015  CLINICAL DATA:  Acute onset of right upper quadrant and epigastric abdominal pain. Initial encounter. EXAM: US ABDOMEN LIMITED - RIGHT UPPER QUADRANT COMPARISON:  None. FINDINGS: Gallbladder: No gallstones or wall thickening visualized. No sonographic Murphy sign noted. Common bile duct: Diameter: 0.4 cm, within normal limits in caliber. Liver: No focal lesion identified. Diffusely increased parenchymal echogenicity is compatible with fatty infiltration. There is mild focal fatty sparing adjacent to the gallbladder fossa. IMPRESSION: 1. No acute abnormality seen at the right upper quadrant. 2. Diffuse fatty infiltration within the liver. Electronically Signed   By: [name] M.D.   On: 07/26/2015 05:09   I have personally reviewed and evaluated these images and lab results as part of my medical decision-making.   EKG Interpretation None      MDM   Final diagnoses:  Epigastric pain    35 male with upper abdominal pain. Multiple recent ER evaluations for the same. Recent CT abdomen and pelvis which is unremarkable. CT angiography of the  chest which showed no evidence of dissection, pulmonary embolism or other acute pathology. Abdominal ultrasound was unremarkable as well. Troponins have consistently been normal. EKG does seem a little to change today with 2 inversions in 1 and aVL. Symptoms generally strike me as atypical for ACS. Patient has been having this pain for several months. Continuous throughout today. He had a troponin drawn at 3:00 this morning. Another one now is still normal. Given the duration of his symptoms I feel it may be beneficial for him to retain stress testing. His only been on a PPI for sure. At time. We'll have him increase this to twice a day.  Virgel Manifold, MD 08/10/15 [phone]

## 2015-07-26 NOTE — Discharge Instructions (Signed)
See the GI doctor for further evaluation. See your primary doctor if the GI appointment is late for optimal management.   Abdominal Pain, Adult Many things can cause abdominal pain. Usually, abdominal pain is not caused by a disease and will improve without treatment. It can often be observed and treated at home. Your health care provider will do a physical exam and possibly order blood tests and X-rays to help determine the seriousness of your pain. However, in many cases, more time must pass before a clear cause of the pain can be found. Before that point, your health care provider may not know if you need more testing or further treatment. HOME CARE INSTRUCTIONS Monitor your abdominal pain for any changes. The following actions may help to alleviate any discomfort you are experiencing:  Only take over-the-counter or prescription medicines as directed by your health care provider.  Do not take laxatives unless directed to do so by your health care provider.  Try a clear liquid diet (broth, tea, or water) as directed by your health care provider. Slowly move to a bland diet as tolerated. SEEK MEDICAL CARE IF:  You have unexplained abdominal pain.  You have abdominal pain associated with nausea or diarrhea.  You have pain when you urinate or have a bowel movement.  You experience abdominal pain that wakes you in the night.  You have abdominal pain that is worsened or improved by eating food.  You have abdominal pain that is worsened with eating fatty foods.  You have a fever. SEEK IMMEDIATE MEDICAL CARE IF:  Your pain does not go away within 2 hours.  You keep throwing up (vomiting).  Your pain is felt only in portions of the abdomen, such as the right side or the left lower portion of the abdomen.  You pass bloody or black tarry stools. MAKE SURE YOU:  Understand these instructions.  Will watch your condition.  Will get help right away if you are not doing well or get  worse.   This information is not intended to replace advice given to you by your health care provider. Make sure you discuss any questions you have with your health care provider.   Document Released: 05/28/2005 Document Revised: 05/09/2015 Document Reviewed: 04/27/2013 Elsevier Interactive Patient Education 2016 [employer] Heartburn Heartburn is a type of pain or discomfort that can happen in the throat or chest. It is often described as a burning pain. It may also cause a bad taste in the mouth. Heartburn may feel worse when you lie down or bend over, and it is often worse at night. Heartburn may be caused by stomach contents that move back up into the esophagus (reflux). HOME CARE INSTRUCTIONS Take these actions to decrease your discomfort and to help avoid complications. Diet  Follow a diet as recommended by your health care provider. This may involve avoiding foods and drinks such as:  Coffee and tea (with or without caffeine).  Drinks that contain alcohol.  Energy drinks and sports drinks.  Carbonated drinks or sodas.  Chocolate and cocoa.  Peppermint and mint flavorings.  Garlic and onions.  Horseradish.  Spicy and acidic foods, including peppers, chili powder, curry powder, vinegar, hot sauces, and barbecue sauce.  Citrus fruit juices and citrus fruits, such as oranges, lemons, and limes.  Tomato-based foods, such as red sauce, chili, salsa, and pizza with red sauce.  Fried and fatty foods, such as donuts, french fries, potato chips, and high-fat dressings.  High-fat meats, such as  hot dogs and fatty cuts of red and white meats, such as rib eye steak, sausage, ham, and bacon.  High-fat dairy items, such as whole milk, butter, and cream cheese.  Eat small, frequent meals instead of large meals.  Avoid drinking large amounts of liquid with your meals.  Avoid eating meals during the 2-3 hours before bedtime.  Avoid lying down right after you eat.  Do not  exercise right after you eat. General Instructions  Pay attention to any changes in your symptoms.  Take over-the-counter and prescription medicines only as told by your health care provider. Do not take aspirin, ibuprofen, or other NSAIDs unless your health care provider told you to do so.  Do not use any tobacco products, including cigarettes, chewing tobacco, and e-cigarettes. If you need help quitting, ask your health care provider.  Wear loose-fitting clothing. Do not wear anything tight around your waist that causes pressure on your abdomen.  Raise (elevate) the head of your bed about 6 inches (15 cm).  Try to reduce your stress, such as with yoga or meditation. If you need help reducing stress, ask your health care provider.  If you are overweight, reduce your weight to an amount that is healthy for you. Ask your health care provider for guidance about a safe weight loss goal.  Keep all follow-up visits as told by your health care provider. This is important. SEEK MEDICAL CARE IF:  You have new symptoms.  You have unexplained weight loss.  You have difficulty swallowing, or it hurts to swallow.  You have wheezing or a persistent cough.  Your symptoms do not improve with treatment.  You have frequent heartburn for more than two weeks. SEEK IMMEDIATE MEDICAL CARE IF:  You have pain in your arms, neck, jaw, teeth, or back.  You feel sweaty, dizzy, or light-headed.  You have chest pain or shortness of breath.  You vomit and your vomit looks like blood or coffee grounds.  Your stool is bloody or black.   This information is not intended to replace advice given to you by your health care provider. Make sure you discuss any questions you have with your health care provider.   Document Released: 01/04/2009 Document Revised: 05/09/2015 Document Reviewed: 12/13/2014 Elsevier Interactive Patient Education 2016 Else[employer] Inc[employer] Gastritis, Adult Gastritis is soreness and  swelling (inflammation) of the lining of the stomach. Gastritis can develop as a sudden onset (acute) or long-term (chronic) condition. If gastritis is not treated, it can lead to stomach bleeding and ulcers. CAUSES  Gastritis occurs when the stomach lining is weak or damaged. Digestive juices from the stomach then inflame the weakened stomach lining. The stomach lining may be weak or damaged due to viral or bacterial infections. One common bacterial infection is the Helicobacter pylori infection. Gastritis can also result from excessive alcohol consumption, taking certain medicines, or having too much acid in the stomach.  SYMPTOMS  In some cases, there are no symptoms. When symptoms are present, they may include:  Pain or a burning sensation in the upper abdomen.  Nausea.  Vomiting.  An uncomfortable feeling of fullness after eating. DIAGNOSIS  Your caregiver may suspect you have gastritis based on your symptoms and a physical exam. To determine the cause of your gastritis, your caregiver may perform the following:  Blood or stool tests to check for the H pylori bacterium.  Gastroscopy. A thin, flexible tube (endoscope) is passed down the esophagus and into the stomach. The endoscope has a light  and camera on the end. Your caregiver uses the endoscope to view the inside of the stomach.  Taking a tissue sample (biopsy) from the stomach to examine under a microscope. TREATMENT  Depending on the cause of your gastritis, medicines may be prescribed. If you have a bacterial infection, such as an H pylori infection, antibiotics may be given. If your gastritis is caused by too much acid in the stomach, H2 blockers or antacids may be given. Your caregiver may recommend that you stop taking aspirin, ibuprofen, or other nonsteroidal anti-inflammatory drugs (NSAIDs). HOME CARE INSTRUCTIONS  Only take over-the-counter or prescription medicines as directed by your caregiver.  If you were given  antibiotic medicines, take them as directed. Finish them even if you start to feel better.  Drink enough fluids to keep your urine clear or pale yellow.  Avoid foods and drinks that make your symptoms worse, such as:  Caffeine or alcoholic drinks.  Chocolate.  Peppermint or mint flavorings.  Garlic and onions.  Spicy foods.  Citrus fruits, such as oranges, lemons, or limes.  Tomato-based foods such as sauce, chili, salsa, and pizza.  Fried and fatty foods.  Eat small, frequent meals instead of large meals. SEEK IMMEDIATE MEDICAL CARE IF:   You have black or dark red stools.  You vomit blood or material that looks like coffee grounds.  You are unable to keep fluids down.  Your abdominal pain gets worse.  You have a fever.  You do not feel better after 1 week.  You have any other questions or concerns. MAKE SURE YOU:  Understand these instructions.  Will watch your condition.  Will get help right away if you are not doing well or get worse.   This information is not intended to replace advice given to you by your health care provider. Make sure you discuss any questions you have with your health care provider.   Document Released: 08/12/2001 Document Revised: 02/17/2012 Document Reviewed: 10/01/2011 Elsevier Interactive Patient Education [employer].

## 2015-07-26 NOTE — ED Notes (Signed)
Pt. Left with all belongings and refused wheelchair. Discharge instructions were reviewed and all questions were answered.  

## 2015-07-26 NOTE — ED Notes (Signed)
Pt reports mid sternal cp that started 1 hour ago. Seen for same on Monday and told it was muscle spasms. Pt having difficulty taking full breath

## 2015-07-26 NOTE — ED Notes (Signed)
Pt here for recurrent stabbing substernal chest pain worse with inspiration associated with SOB. He states he has been seen here 3 times this week for exact same complaint and has been told there is "nothing wrong and I was told it is an ulcer and need to follow up with GI doctor." He states the meds he was prescribed home with are not working and are just making him feel worse.

## 2015-07-26 NOTE — ED Notes (Signed)
Physician made aware of patient request for more pain meds.

## 2015-07-26 NOTE — ED Notes (Signed)
Spoke with Dr Stark Jock regarding pt, received order for istat troponin and EKG. Xray and lab workup done this morning.

## 2015-07-26 NOTE — ED Provider Notes (Signed)
CSN: [id number]     Arrival date & time 07/26/15  0235 History   By signing my name below, I, Joshua Parrish, attest that this documentation has been prepared under the direction and in the presence of [name], MD . Electronically Signed: [name], Scribe. 07/26/2015. 3:59 AM.  Chief Complaint  Patient presents with  . Abdominal Pain   The history is provided by the patient. No language interpreter was used.   HPI Comments:  Joshua Parrish is a 39 y.o. male who presents to the Emergency Department complaining of sharp epigastric pain which began ~0100 this am. Pain is worse when seated and when supine. His pain is also exacerbated with deep breathe.  He has been experiencing the same pain for ~ 3 months; states pain was initially occuring once a week but the episodes have been occurring daily for the last few days. He was recently seen in the ED for same pain and had negative chest and abdominal XR. He reports associated bilateral flank pain. Pt denies smoking and drinking. No alleviating factors noted.  Past Medical History  Diagnosis Date  . Rotator cuff impingement syndrome of left shoulder 06/19/2015  . Depression   . Osteoarthritis of shoulder     left   Past Surgical History  Procedure Laterality Date  . Clavicle excision    . Appendectomy    . Knee surgery    . Cervical laminectomy    . Hypospadias correction      as child  . Shoulder arthroscopy with rotator cuff repair Left 06/26/2015    Procedure: SHOULDER ARTHROSCOPY WITH ROTATOR CUFF REPAIR;  Surgeon: Elsie Saas, MD;  Location: [facility];  Service: Orthopedics;  Laterality: Left;   Family History  Problem Relation Age of Onset  . Cancer Other   . Hypertension Other   . Diabetes Other    Social History  Substance Use Topics  . Smoking status: Never Smoker   . Smokeless tobacco: Current User    Types: Chew  . Alcohol Use: Yes     Comment: occasional    Review of Systems  10 systems  reviewed and all are negative for acute change except as noted in the HPI.   Allergies  Review of patient's allergies indicates no known allergies.  Home Medications   Prior to Admission medications   Medication Sig Start Date End Date Taking? Authorizing Provider  Amino Acids (AMINO ACID PO) Take 1 scoop by mouth daily.    Historical Provider, MD  diazepam (VALIUM) 5 MG tablet Take 1 tablet (5 mg total) by mouth every 8 (eight) hours as needed for anxiety. 07/23/15   Dalia Heading, PA-C  HYDROcodone-acetaminophen (NORCO/VICODIN) 5-325 MG tablet Take 1 tablet by mouth every 6 (six) hours as needed. 07/26/15   [name], MD  omeprazole (PRILOSEC) 20 MG capsule Take 1 capsule (20 mg total) by mouth daily. 07/26/15   [name], MD  oxyCODONE (ROXICODONE) 5 MG immediate release tablet 1-2 tablets every 4-6 hrs as needed for pain 06/26/15   [name], PA-C   BP 123/80 mmHg  Pulse 78  Temp(Src) 98.7 F (37.1 C)  Resp 18  Ht 5\' 8"  (1.727 m)  Wt 240 lb (108.863 kg)  BMI 36.50 kg/m2  SpO2 93% Physical Exam  Constitutional: He is oriented to person, place, and time. He appears well-developed and well-nourished. No distress.  HENT:  Head: Normocephalic and atraumatic.  Eyes: Conjunctivae are normal.  Cardiovascular: Normal rate, regular rhythm  and normal heart sounds.   Pulmonary/Chest: Effort normal. He exhibits tenderness (bilaterally).  Abdominal: Soft. He exhibits no distension. There is tenderness. There is guarding.  Epigastric and RUQ tenderness  Neurological: He is alert and oriented to person, place, and time.  Skin: Skin is warm and dry.  Psychiatric: He has a normal mood and affect.  Nursing note and vitals reviewed.   ED Course  Procedures  DIAGNOSTIC STUDIES:  Oxygen Saturation is 98% on RA, normal by my interpretation.    COORDINATION OF CARE:  3:45 AM Discussed treatment plan with pt at bedside and pt agreed to plan.  Labs Review Labs  Reviewed  HEPATIC FUNCTION PANEL - Abnormal; Notable for the following:    AST 62 (*)    All other components within normal limits  BASIC METABOLIC PANEL  CBC  LIPASE, BLOOD  I-STAT TROPOININ, ED    Imaging Review Dg Chest 2 View  07/26/2015  CLINICAL DATA:  Acute onset of generalized chest pain and shortness of breath. Initial encounter. EXAM: CHEST  2 VIEW COMPARISON:  Chest radiograph and CTA of the chest performed 07/23/2015 FINDINGS: The lungs are well-aerated. Minimal bibasilar atelectasis is noted. There is no evidence of pleural effusion or pneumothorax. The heart is normal in size; the mediastinal contour is within normal limits. No acute osseous abnormalities are seen. Cervical spinal fusion hardware is noted. IMPRESSION: Minimal bibasilar atelectasis noted.  Lungs otherwise clear Electronically Signed   By: [name] M.D.   On: 07/26/2015 03:03   US Abdomen Limited  07/26/2015  CLINICAL DATA:  Acute onset of right upper quadrant and epigastric abdominal pain. Initial encounter. EXAM: US ABDOMEN LIMITED - RIGHT UPPER QUADRANT COMPARISON:  None. FINDINGS: Gallbladder: No gallstones or wall thickening visualized. No sonographic Murphy sign noted. Common bile duct: Diameter: 0.4 cm, within normal limits in caliber. Liver: No focal lesion identified. Diffusely increased parenchymal echogenicity is compatible with fatty infiltration. There is mild focal fatty sparing adjacent to the gallbladder fossa. IMPRESSION: 1. No acute abnormality seen at the right upper quadrant. 2. Diffuse fatty infiltration within the liver. Electronically Signed   By: [name] M.D.   On: 07/26/2015 05:09   I have personally reviewed and evaluated these images and lab results as part of my medical decision-making.   EKG Interpretation   Date/Time:  Thursday July 26 2015 02:42:19 EST Ventricular Rate:  113 PR Interval:  138 QRS Duration: 82 QT Interval:  336 QTC Calculation: 460 R Axis:    73 Text Interpretation:  Sinus tachycardia Otherwise normal ECG No acute  changes No significant change since last tracing Confirmed by Kathrynn Humble,  MD, Thelma Comp [phone]) on 07/26/2015 3:36:06 AM      MDM   Final diagnoses:  Abdominal discomfort, epigastric    I personally performed the services described in this documentation, which was scribed in my presence. The recorded information has been reviewed and is accurate.  DDx includes: Pancreatitis Hepatobiliary pathology including cholecystitis Gastritis/PUD SBO ACS syndrome Aortic Dissection  Pt with severe epigastric abd pain and RUQ pain. CT abd and CT angio recently neg, with neg GI labs. Labs again reassuring, and so [place] RUQ. No clinical risk factors for dissection - specifically, no cocaine use, uncontrolled BP, family hx of MS disorder, heavy smoking. Vascular exam is normal. EKG shows tachycardia, but HR normalized overtime on it's own.  Advised GI f/u and PCP f/u. ? PUD, or need for HIDA scan.   [name], MD 07/26/15 [phone]

## 2015-07-30 ENCOUNTER — Encounter: Payer: Self-pay | Admitting: Gastroenterology

## 2015-08-06 ENCOUNTER — Ambulatory Visit: Payer: Self-pay | Admitting: Gastroenterology

## 2015-08-07 ENCOUNTER — Encounter: Payer: Self-pay | Admitting: Physician Assistant

## 2015-08-07 ENCOUNTER — Ambulatory Visit (INDEPENDENT_AMBULATORY_CARE_PROVIDER_SITE_OTHER): Payer: Commercial Managed Care - HMO | Admitting: Physician Assistant

## 2015-08-07 VITALS — BP 128/72 | HR 88 | Ht 68.0 in | Wt 245.0 lb

## 2015-08-07 DIAGNOSIS — K760 Fatty (change of) liver, not elsewhere classified: Secondary | ICD-10-CM | POA: Diagnosis not present

## 2015-08-07 DIAGNOSIS — R1013 Epigastric pain: Secondary | ICD-10-CM | POA: Diagnosis not present

## 2015-08-07 DIAGNOSIS — K219 Gastro-esophageal reflux disease without esophagitis: Secondary | ICD-10-CM | POA: Diagnosis not present

## 2015-08-07 MED ORDER — FAMOTIDINE 20 MG PO TABS: 20.0000 mg | ORAL_TABLET | Freq: Every day | ORAL | Status: DC

## 2015-08-07 MED ORDER — PANTOPRAZOLE SODIUM 40 MG PO TBEC: 40.0000 mg | DELAYED_RELEASE_TABLET | Freq: Two times a day (BID) | ORAL | Status: DC

## 2015-08-07 NOTE — Progress Notes (Addendum)
Patient ID: Joshua Parrish, male   DOB: 07-20-1976, 39 y.o.   MRN: [id number]    HPI:  Joshua Parrish is a 39 y.o.   male referred by Virgel Manifold, MD for evaluation of epigastric pain. Raziel has a past medical history of depression and osteoarthritis. He has had a cervical laminectomy earlier this year and also in October of this year had an arthroscopy of his left shoulder with rotator cuff repair and acromioplasty.  Endo reports that he has been to the emergency room 3 times in the past month for epigastric pain. He reports that he has been getting frequent heartburn for which she has been using toms on an almost daily basis without relief. He reports that several times when he was in the ER he was experiencing chest pain. Cardiac testing has been negative. He states each time he was given a GI cocktail and felt better. He was recently started on twice a day proton next and feels that his epigastric pain has diminished with Protonix. He is also using omeprazole intermittently as well as famotidine. He reports that his epigastric pain comes and goes and is not affected by food. He has frequent nausea that is worse on an empty stomach, and temporary fully alleviated with ingestion of food. He admits to early satiety but has not had any weight loss. Recent ultrasound in the emergency room revealed mild fatty liver. He admits to rare use of alcohol. He denies any bright red blood per rectum or melena. He has no dysphasia. He reports that he frequently feels as if he has a lump in his throat. He also reports that over the past several months since his surgery he has been skipping 2-3 days between bowel movements and then passes a hard stool. Had no bright red blood per rectum or melena. He denies a family history of colon cancer, colon polyps, or inflammatory bowel disease. He denies a family history of stomach or esophageal cancer. He states his maternal grandmother had breast and bone cancer.   Past Medical History    Diagnosis Date  . Rotator cuff impingement syndrome of left shoulder 06/19/2015  . Depression   . Osteoarthritis of shoulder     left    Past Surgical History  Procedure Laterality Date  . Clavicle excision    . Appendectomy    . Knee surgery    . Cervical laminectomy    . Hypospadias correction      as child  . Shoulder arthroscopy with rotator cuff repair Left 06/26/2015    Procedure: SHOULDER ARTHROSCOPY WITH ROTATOR CUFF REPAIR;  Surgeon: Elsie Saas, MD;  Location: [facility];  Service: Orthopedics;  Laterality: Left;   Family History  Problem Relation Age of Onset  . Cancer Other   . Hypertension Other   . Diabetes Other    Social History  Substance Use Topics  . Smoking status: Never Smoker   . Smokeless tobacco: Current User    Types: Chew  . Alcohol Use: Yes     Comment: occasional   Current Outpatient Prescriptions  Medication Sig Dispense Refill  . Amino Acids (AMINO ACID PO) Take 1 scoop by mouth daily.    . diazepam (VALIUM) 5 MG tablet Take 1 tablet (5 mg total) by mouth every 8 (eight) hours as needed for anxiety. 12 tablet 0  . famotidine (PEPCID) 20 MG tablet Take 1 tablet (20 mg total) by mouth at bedtime. 30 tablet 0  . gabapentin (  NEURONTIN) 300 MG capsule Take 300 mg by mouth 3 (three) times daily as needed. Nerve pain  0  . HYDROcodone-acetaminophen (NORCO/VICODIN) 5-325 MG tablet Take 1 tablet by mouth every 6 (six) hours as needed. (Patient taking differently: Take 1 tablet by mouth every 6 (six) hours as needed for moderate pain. ) 6 tablet 0  . omeprazole (PRILOSEC) 20 MG capsule Take 1 capsule (20 mg total) by mouth daily. 60 capsule 0  . oxyCODONE (ROXICODONE) 5 MG immediate release tablet 1-2 tablets every 4-6 hrs as needed for pain 40 tablet 0  . pantoprazole (PROTONIX) 40 MG tablet Take 1 tablet (40 mg total) by mouth 2 (two) times daily. 180 tablet 3  . famotidine (PEPCID) 20 MG tablet Take 1 tablet (20 mg total) by mouth at  bedtime. 30 tablet 3   No current facility-administered medications for this visit.   No Known Allergies   Review of Systems: Gen: Denies any fever, chills, sweats, anorexia, fatigue, weakness, malaise, weight loss, and sleep disorder CV: Denies chest pain, angina, palpitations, syncope, orthopnea, PND, peripheral edema, and claudication. Resp: Denies dyspnea at rest, dyspnea with exercise, cough, sputum, wheezing, coughing up blood, and pleurisy. GI: Denies vomiting blood, jaundice, and fecal incontinence.   Denies dysphagia or odynophagia. GU : Denies urinary burning, blood in urine, urinary frequency, urinary hesitancy, nocturnal urination, and urinary incontinence. MS: Has left shoulder pain since his recent surgery Derm: Denies rash, itching, dry skin, hives, moles, warts, or unhealing ulcers.  Psych: Denies depression, anxiety, memory loss, suicidal ideation, hallucinations, paranoia, and confusion. Heme: Denies bruising, bleeding, and enlarged lymph nodes. Neuro:  Denies any headaches, dizziness, paresthesias. Endo:  Denies any problems with DM, thyroid, adrenal function  Studies: Dg Chest 2 View  07/26/2015  CLINICAL DATA:  Acute onset of generalized chest pain and shortness of breath. Initial encounter. EXAM: CHEST  2 VIEW COMPARISON:  Chest radiograph and CTA of the chest performed 07/23/2015 FINDINGS: The lungs are well-aerated. Minimal bibasilar atelectasis is noted. There is no evidence of pleural effusion or pneumothorax. The heart is normal in size; the mediastinal contour is within normal limits. No acute osseous abnormalities are seen. Cervical spinal fusion hardware is noted. IMPRESSION: Minimal bibasilar atelectasis noted.  Lungs otherwise clear Electronically Signed   By: [name] M.D.   On: 07/26/2015 03:03   Dg Chest 2 View  07/23/2015  CLINICAL DATA:  Chest pain and shortness of breath since this morning. EXAM: CHEST  2 VIEW COMPARISON:  08/21/2009 FINDINGS:  Both lungs are clear without airspace disease or pulmonary edema. Surgical plate in the lower cervical spine. Heart and mediastinum are within normal limits. The trachea is midline. Chronic shortening of the lateral right clavicle may represent postsurgical changes. IMPRESSION: No active cardiopulmonary disease. Electronically Signed   By: Markus Daft M.D.   On: 07/23/2015 08:27   Ct Angio Chest Pe W/cm &/or Wo Cm  07/23/2015  CLINICAL DATA:  39 year old with epigastric pain that radiates into the chest. EXAM: CT ANGIOGRAPHY CHEST WITH CONTRAST TECHNIQUE: Multidetector CT imaging of the chest was performed using the standard protocol during bolus administration of intravenous contrast. Multiplanar CT image reconstructions and MIPs were obtained to evaluate the vascular anatomy. CONTRAST:  181mL OMNIPAQUE IOHEXOL 350 MG/ML SOLN COMPARISON:  Chest radiograph 07/23/2015 FINDINGS: Mediastinum/Lymph Nodes: Opacification of the pulmonary arteries is not optimal but no evidence to suggest a large or central pulmonary embolism. No significant pericardial fluid. No evidence for chest lymphadenopathy. Lungs/Pleura: Trachea and  mainstem bronchi are patent. Few scattered ground-glass densities are nonspecific and may represent atelectasis. There is no significant airspace disease or consolidation. Upper abdomen: No acute findings. Musculoskeletal: Anterior surgical plate in the lower cervical spine. No acute bone abnormality. Review of the MIP images confirms the above findings. IMPRESSION: No evidence for a large or central pulmonary embolism as described. No acute chest findings. Electronically Signed   By: Markus Daft M.D.   On: 07/23/2015 13:18   Ct Abdomen Pelvis W Contrast  07/23/2015  CLINICAL DATA:  39 year old with epigastric pain that radiates into the chest. EXAM: CT ANGIOGRAPHY CHEST WITH CONTRAST TECHNIQUE: Multidetector CT imaging of the chest was performed using the standard protocol during bolus  administration of intravenous contrast. Multiplanar CT image reconstructions and MIPs were obtained to evaluate the vascular anatomy. CONTRAST:  128mL OMNIPAQUE IOHEXOL 350 MG/ML SOLN COMPARISON:  Chest radiograph 07/23/2015 FINDINGS: Mediastinum/Lymph Nodes: Opacification of the pulmonary arteries is not optimal but no evidence to suggest a large or central pulmonary embolism. No significant pericardial fluid. No evidence for chest lymphadenopathy. Lungs/Pleura: Trachea and mainstem bronchi are patent. Few scattered ground-glass densities are nonspecific and may represent atelectasis. There is no significant airspace disease or consolidation. Upper abdomen: No acute findings. Musculoskeletal: Anterior surgical plate in the lower cervical spine. No acute bone abnormality. Review of the MIP images confirms the above findings. IMPRESSION: No evidence for a large or central pulmonary embolism as described. No acute chest findings. Electronically Signed   By: Markus Daft M.D.   On: 07/23/2015 13:18   US Abdomen Limited  07/26/2015  CLINICAL DATA:  Acute onset of right upper quadrant and epigastric abdominal pain. Initial encounter. EXAM: US ABDOMEN LIMITED - RIGHT UPPER QUADRANT COMPARISON:  None. FINDINGS: Gallbladder: No gallstones or wall thickening visualized. No sonographic Murphy sign noted. Common bile duct: Diameter: 0.4 cm, within normal limits in caliber. Liver: No focal lesion identified. Diffusely increased parenchymal echogenicity is compatible with fatty infiltration. There is mild focal fatty sparing adjacent to the gallbladder fossa. IMPRESSION: 1. No acute abnormality seen at the right upper quadrant. 2. Diffuse fatty infiltration within the liver. Electronically Signed   By: [name] M.D.   On: 07/26/2015 05:09    LAB RESULTS: Blood work 07/26/2015 revealed a CBC with WBC 6.3, hemoglobin 15.5, hematocrit 44.5, platelets 283,000. Hepatic function panel AST 62, ALT 52, alkaline phosphatase  73, total bili 1.0, direct bili 0.3. Indirect bili 0.7. Lipase 42.    Physical Exam: BP 128/72 mmHg  Pulse 88  Ht 5\' 8"  (1.727 m)  Wt 245 lb (111.131 kg)  BMI 37.26 kg/m2 Constitutional: Pleasant,well-developed, male in no acute distress. HEENT: Normocephalic and atraumatic. Conjunctivae are normal. No scleral icterus. Neck supple. No JVD Cardiovascular: Normal rate, regular rhythm.  Pulmonary/chest: Effort normal and breath sounds normal. No wheezing, rales or rhonchi. Abdominal: Soft, nondistended, mild epigastric tenderness to palpation with no rebound or guarding Bowel sounds active throughout. There are no masses palpable. No hepatomegaly. Extremities: no edema Lymphadenopathy: No cervical adenopathy noted. Neurological: Alert and oriented to person place and time. Skin: Skin is warm and dry. No rashes noted. Psychiatric: Normal mood and affect. Behavior is normal.  ASSESSMENT AND PLAN: #1. Epigastric pain. This is likely due to some poorly controlled reflux. An antireflux regimen has been reviewed with the patient he readily admits that he has an Affinity for spicy foods, citrus products, caffeine, and he admits to frequently eating late at night. He will continue pantoprazole 40  mg 1 by mouth twice a day along with famotidine 20 mg by mouth daily at bedtime when necessary. He will discontinue omeprazole. He will be scheduled for an EGD to evaluate for esophagitis, gastritis, ulcer, duodenitis, etc.The risks, benefits, and alternatives to endoscopy with possible biopsy and possible dilation were discussed with the patient and they consent to proceed.  The procedure will be scheduled with Dr. Hilarie Fredrickson. Further recommendations will be made pending the findings of the above. It is also been reviewed with the patient that his ultrasound revealed fatty liver, and he was advised to try to lose weight, exercise, and minimize alcohol consumption.  Rylea Selway, Deloris Ping 08/07/2015, 9:25  PM  CC: Virgel Manifold, MD   Addendum: Reviewed and agree with initial management. Jerene Bears, MD

## 2015-08-07 NOTE — Patient Instructions (Addendum)
Please STOP taking Omeprazole.  We have sent the following medications to your pharmacy for you to pick up at your convenience: Pantoprazole 40mg  one by mouth twice a day. Famotidine 20mg  one by mouth at bedtime.   You have been scheduled for an endoscopy. Please follow written instructions given to you at your visit today. If you use inhalers (even only as needed), please bring them with you on the day of your procedure. Your physician has requested that you go to www.startemmi.com and enter the access code given to you at your visit today. This web site gives a general overview about your procedure. However, you should still follow specific instructions given to you by our office regarding your preparation for the procedure.   Gastroesophageal Reflux Disease, Adult Normally, food travels down the esophagus and stays in the stomach to be digested. However, when a person has gastroesophageal reflux disease (GERD), food and stomach acid move back up into the esophagus. When this happens, the esophagus becomes sore and inflamed. Over time, GERD can create small holes (ulcers) in the lining of the esophagus.  CAUSES This condition is caused by a problem with the muscle between the esophagus and the stomach (lower esophageal sphincter, or LES). Normally, the LES muscle closes after food passes through the esophagus to the stomach. When the LES is weakened or abnormal, it does not close properly, and that allows food and stomach acid to go back up into the esophagus. The LES can be weakened by certain dietary substances, medicines, and medical conditions, including:  Tobacco use.  Pregnancy.  Having a hiatal hernia.  Heavy alcohol use.  Certain foods and beverages, such as coffee, chocolate, onions, and peppermint. RISK FACTORS This condition is more likely to develop in:  People who have an increased body weight.  People who have connective tissue disorders.  People who use NSAID  medicines. SYMPTOMS Symptoms of this condition include:  Heartburn.  Difficult or painful swallowing.  The feeling of having a lump in the throat.  Abitter taste in the mouth.  Bad breath.  Having a large amount of saliva.  Having an upset or bloated stomach.  Belching.  Chest pain.  Shortness of breath or wheezing.  Ongoing (chronic) cough or a night-time cough.  Wearing away of tooth enamel.  Weight loss. Different conditions can cause chest pain. Make sure to see your health care provider if you experience chest pain. DIAGNOSIS Your health care provider will take a medical history and perform a physical exam. To determine if you have mild or severe GERD, your health care provider may also monitor how you respond to treatment. You may also have other tests, including:  An endoscopy toexamine your stomach and esophagus with a small camera.  A test thatmeasures the acidity level in your esophagus.  A test thatmeasures how much pressure is on your esophagus.  A barium swallow or modified barium swallow to show the shape, size, and functioning of your esophagus. TREATMENT The goal of treatment is to help relieve your symptoms and to prevent complications. Treatment for this condition may vary depending on how severe your symptoms are. Your health care provider may recommend:  Changes to your diet.  Medicine.  Surgery. HOME CARE INSTRUCTIONS Diet  Follow a diet as recommended by your health care provider. This may involve avoiding foods and drinks such as:  Coffee and tea (with or without caffeine).  Drinks that containalcohol.  Energy drinks and sports drinks.  Carbonated drinks or sodas.  Chocolate and cocoa.  Peppermint and mint flavorings.  Garlic and onions.  Horseradish.  Spicy and acidic foods, including peppers, chili powder, curry powder, vinegar, hot sauces, and barbecue sauce.  Citrus fruit juices and citrus fruits, such as oranges,  lemons, and limes.  Tomato-based foods, such as red sauce, chili, salsa, and pizza with red sauce.  Fried and fatty foods, such as donuts, french fries, potato chips, and high-fat dressings.  High-fat meats, such as hot dogs and fatty cuts of red and white meats, such as rib eye steak, sausage, ham, and bacon.  High-fat dairy items, such as whole milk, butter, and cream cheese.  Eat small, frequent meals instead of large meals.  Avoid drinking large amounts of liquid with your meals.  Avoid eating meals during the 2-3 hours before bedtime.  Avoid lying down right after you eat.  Do not exercise right after you eat. General Instructions  Pay attention to any changes in your symptoms.  Take over-the-counter and prescription medicines only as told by your health care provider. Do not take aspirin, ibuprofen, or other NSAIDs unless your health care provider told you to do so.  Do not use any tobacco products, including cigarettes, chewing tobacco, and e-cigarettes. If you need help quitting, ask your health care provider.  Wear loose-fitting clothing. Do not wear anything tight around your waist that causes pressure on your abdomen.  Raise (elevate) the head of your bed 6 inches (15cm).  Try to reduce your stress, such as with yoga or meditation. If you need help reducing stress, ask your health care provider.  If you are overweight, reduce your weight to an amount that is healthy for you. Ask your health care provider for guidance about a safe weight loss goal.  Keep all follow-up visits as told by your health care provider. This is important. SEEK MEDICAL CARE IF:  You have new symptoms.  You have unexplained weight loss.  You have difficulty swallowing, or it hurts to swallow.  You have wheezing or a persistent cough.  Your symptoms do not improve with treatment.  You have a hoarse voice. SEEK IMMEDIATE MEDICAL CARE IF:  You have pain in your arms, neck, jaw,  teeth, or back.  You feel sweaty, dizzy, or light-headed.  You have chest pain or shortness of breath.  You vomit and your vomit looks like blood or coffee grounds.  You faint.  Your stool is bloody or black.  You cannot swallow, drink, or eat.   This information is not intended to replace advice given to you by your health care provider. Make sure you discuss any questions you have with your health care provider.   Document Released: 05/28/2005 Document Revised: 05/09/2015 Document Reviewed: 12/13/2014 Elsevier Interactive Patient Education 2016 [place] for Gastroesophageal Reflux Disease, Adult When you have gastroesophageal reflux disease (GERD), the foods you eat and your eating habits are very important. Choosing the right foods can help ease the discomfort of GERD. WHAT GENERAL GUIDELINES DO I NEED TO FOLLOW?  Choose fruits, vegetables, whole grains, low-fat dairy products, and low-fat meat, fish, and poultry.  Limit fats such as oils, salad dressings, butter, nuts, and avocado.  Keep a food diary to identify foods that cause symptoms.  Avoid foods that cause reflux. These may be different for different people.  Eat frequent small meals instead of three large meals each day.  Eat your meals slowly, in a relaxed setting.  Limit fried foods.  Joshua Parrish  foods using methods other than frying.  Avoid drinking alcohol.  Avoid drinking large amounts of liquids with your meals.  Avoid bending over or lying down until 2-3 hours after eating. WHAT FOODS ARE NOT RECOMMENDED? The following are some foods and drinks that may worsen your symptoms: Vegetables Tomatoes. Tomato juice. Tomato and spaghetti sauce. Chili peppers. Onion and garlic. Horseradish. Fruits Oranges, grapefruit, and lemon (fruit and juice). Meats High-fat meats, fish, and poultry. This includes hot dogs, ribs, ham, sausage, salami, and bacon. Dairy Whole milk and chocolate milk. Sour  cream. Cream. Butter. Ice cream. Cream cheese.  Beverages Coffee and tea, with or without caffeine. Carbonated beverages or energy drinks. Condiments Hot sauce. Barbecue sauce.  Sweets/Desserts Chocolate and cocoa. Donuts. Peppermint and spearmint. Fats and Oils High-fat foods, including [place] fries and potato chips. Other Vinegar. Strong spices, such as black pepper, white pepper, red pepper, cayenne, curry powder, cloves, ginger, and chili powder. The items listed above may not be a complete list of foods and beverages to avoid. Contact your dietitian for more information.   This information is not intended to replace advice given to you by your health care provider. Make sure you discuss any questions you have with your health care provider.   Document Released: 08/18/2005 Document Revised: 09/08/2014 Document Reviewed: 06/22/2013 Elsevier Interactive Patient Education [employer].

## 2015-08-21 ENCOUNTER — Encounter: Payer: Self-pay | Admitting: Internal Medicine

## 2015-08-21 ENCOUNTER — Ambulatory Visit (AMBULATORY_SURGERY_CENTER): Payer: Commercial Managed Care - HMO | Admitting: Internal Medicine

## 2015-08-21 VITALS — BP 136/93 | HR 70 | Temp 95.6°F | Resp 18 | Ht 68.0 in | Wt 245.0 lb

## 2015-08-21 DIAGNOSIS — R12 Heartburn: Secondary | ICD-10-CM

## 2015-08-21 DIAGNOSIS — R6881 Early satiety: Secondary | ICD-10-CM | POA: Diagnosis not present

## 2015-08-21 DIAGNOSIS — K208 Other esophagitis: Secondary | ICD-10-CM | POA: Diagnosis not present

## 2015-08-21 DIAGNOSIS — R1013 Epigastric pain: Secondary | ICD-10-CM | POA: Diagnosis not present

## 2015-08-21 DIAGNOSIS — K3189 Other diseases of stomach and duodenum: Secondary | ICD-10-CM | POA: Diagnosis not present

## 2015-08-21 MED ORDER — SODIUM CHLORIDE 0.9 % IV SOLN: 500.0000 mL | INTRAVENOUS | Status: DC

## 2015-08-21 NOTE — Patient Instructions (Signed)
Discharge instructions given. Biopsies taken. Gastroparesis diet given in recovery. Avoid narcotics when possible. Resume previous medications. YOU HAD AN ENDOSCOPIC PROCEDURE TODAY AT [facility]:   Refer to the procedure report that was given to you for any specific questions about what was found during the examination.  If the procedure report does not answer your questions, please call your gastroenterologist to clarify.  If you requested that your care partner not be given the details of your procedure findings, then the procedure report has been included in a sealed envelope for you to review at your convenience later.  YOU SHOULD EXPECT: Some feelings of bloating in the abdomen. Passage of more gas than usual.  Walking can help get rid of the air that was put into your GI tract during the procedure and reduce the bloating. If you had a lower endoscopy (such as a colonoscopy or flexible sigmoidoscopy) you may notice spotting of blood in your stool or on the toilet paper. If you underwent a bowel prep for your procedure, you may not have a normal bowel movement for a few days.  Please Note:  You might notice some irritation and congestion in your nose or some drainage.  This is from the oxygen used during your procedure.  There is no need for concern and it should clear up in a day or so.  SYMPTOMS TO REPORT IMMEDIATELY:     Following upper endoscopy (EGD)  Vomiting of blood or coffee ground material  New chest pain or pain under the shoulder blades  Painful or persistently difficult swallowing  New shortness of breath  Fever of 100F or higher  Black, tarry-looking stools  For urgent or emergent issues, a gastroenterologist can be reached at any hour by calling [phone].   DIET: Your first meal following the procedure should be a small meal and then it is ok to progress to your normal diet. Heavy or fried foods are harder to digest and may make you feel  nauseous or bloated.  Likewise, meals heavy in dairy and vegetables can increase bloating.  Drink plenty of fluids but you should avoid alcoholic beverages for 24 hours.  ACTIVITY:  You should plan to take it easy for the rest of today and you should NOT DRIVE or use heavy machinery until tomorrow (because of the sedation medicines used during the test).    FOLLOW UP: Our staff will call the number listed on your records the next business day following your procedure to check on you and address any questions or concerns that you may have regarding the information given to you following your procedure. If we do not reach you, we will leave a message.  However, if you are feeling well and you are not experiencing any problems, there is no need to return our call.  We will assume that you have returned to your regular daily activities without incident.  If any biopsies were taken you will be contacted by phone or by letter within the next 1-3 weeks.  Please call us at [phone] if you have not heard about the biopsies in 3 weeks.    SIGNATURES/CONFIDENTIALITY: You and/or your care partner have signed paperwork which will be entered into your electronic medical record.  These signatures attest to the fact that that the information above on your After Visit Summary has been reviewed and is understood.  Full responsibility of the confidentiality of this discharge information lies with you and/or your care-partner.

## 2015-08-21 NOTE — Progress Notes (Signed)
Called to room to assist during endoscopic procedure.  Patient ID and intended procedure confirmed with present staff. Received instructions for my participation in the procedure from the performing physician.  

## 2015-08-21 NOTE — Progress Notes (Signed)
Dental advisory given to patient 

## 2015-08-21 NOTE — Progress Notes (Signed)
A/ox3 pleased with MAC, report to Celia RN 

## 2015-08-21 NOTE — Op Note (Signed)
[facility]  [address] [place], [id number]   ENDOSCOPY PROCEDURE REPORT  PATIENT: Joshua Parrish, Joshua Parrish  MR#: [id number] BIRTHDATE: 01-31-1976 , 39  yrs. old GENDER: male ENDOSCOPIST: Jerene Bears, MD REFERRED BY:  Dr. Virgel Manifold PROCEDURE DATE:  08/21/2015 PROCEDURE:  EGD, diagnostic and EGD w/ biopsy ASA CLASS:     Class II INDICATIONS:  epigastric pain, history of GERD, and early satiety. MEDICATIONS: Monitored anesthesia care and Propofol 250 mg IV TOPICAL ANESTHETIC: none  DESCRIPTION OF PROCEDURE: After the risks benefits and alternatives of the procedure were thoroughly explained, informed consent was obtained.  The LB [id number] endoscope was introduced through the mouth and advanced to the second portion of the duodenum , Without limitations.  The instrument was slowly withdrawn as the mucosa was fully examined.  ESOPHAGUS: The mucosa of the esophagus appeared normal.   The z-line was located 40cm from the incisors.  The z line appeared slightly irregular with 2 very short tongues of possible Barrett's change; biopsies were taken.  STOMACH: There was a large amount of residual food seen in the gastric body and gastric antrum.  Based on this, I suspect the patient has some level of gastroparesis.  Due to the residual food, complete mucosal examination could not be performed.  Cold forcep biopsies were taken at the gastric body, antrum and angularis to evaluate for h.  pylori.   No obstructing lesion or stricture seen.  DUODENUM: The duodenal mucosa showed no abnormalities in the bulb and 2nd part of the duodenum.  Retroflexed views revealed a small hiatal hernia.     The scope was then withdrawn from the patient and the procedure completed.  COMPLICATIONS: There were no immediate complications.     ENDOSCOPIC IMPRESSION: 1.   The mucosa of the esophagus appeared normal 2.   The z-line was located 40cm from the incisors with  slight irregularity; biopsies performed 3.   Food residue in the gastric body and gastric antrum suggesting gastroparesis; biopsies to exclude H. pylori 4.   The duodenal mucosa showed no abnormalities in the bulb and 2nd part of the duodenum  RECOMMENDATIONS: 1.  Await biopsy results 2.  Gastroparesis diet (Stage III) 3.  Avoid narcotics when possible 4.  If epigastric pain and nausea continue or worsen, could consider trial of metoclopramide therapy.  Office visit for follow-up  eSigned:  Jerene Bears, MD 08/21/2015 9:36 AM    CC: the patient, PCP  PATIENT NAME:  Joshua Parrish, Joshua Parrish MR#: [id number]

## 2015-08-22 ENCOUNTER — Telehealth: Payer: Self-pay | Admitting: *Deleted

## 2015-08-22 NOTE — Telephone Encounter (Signed)
No answer, left message to call if questions or concerns. 

## 2015-08-30 ENCOUNTER — Encounter: Payer: Self-pay | Admitting: Internal Medicine

## 2015-09-01 ENCOUNTER — Telehealth: Payer: Self-pay | Admitting: Gastroenterology

## 2015-09-01 ENCOUNTER — Encounter (HOSPITAL_COMMUNITY): Payer: Self-pay

## 2015-09-01 ENCOUNTER — Emergency Department (HOSPITAL_COMMUNITY): Payer: Commercial Managed Care - HMO

## 2015-09-01 ENCOUNTER — Emergency Department (HOSPITAL_COMMUNITY)
Admission: EM | Admit: 2015-09-01 | Discharge: 2015-09-02 | Disposition: A | Discharge status: Home / Self Care | Payer: Commercial Managed Care - HMO | Attending: Emergency Medicine | Admitting: Emergency Medicine

## 2015-09-01 DIAGNOSIS — Z9049 Acquired absence of other specified parts of digestive tract: Secondary | ICD-10-CM | POA: Insufficient documentation

## 2015-09-01 DIAGNOSIS — Z8739 Personal history of other diseases of the musculoskeletal system and connective tissue: Secondary | ICD-10-CM | POA: Insufficient documentation

## 2015-09-01 DIAGNOSIS — F329 Major depressive disorder, single episode, unspecified: Secondary | ICD-10-CM | POA: Insufficient documentation

## 2015-09-01 DIAGNOSIS — R110 Nausea: Secondary | ICD-10-CM

## 2015-09-01 DIAGNOSIS — K3184 Gastroparesis: Secondary | ICD-10-CM | POA: Diagnosis not present

## 2015-09-01 DIAGNOSIS — R1011 Right upper quadrant pain: Secondary | ICD-10-CM | POA: Diagnosis present

## 2015-09-01 DIAGNOSIS — R109 Unspecified abdominal pain: Secondary | ICD-10-CM

## 2015-09-01 DIAGNOSIS — Z79899 Other long term (current) drug therapy: Secondary | ICD-10-CM | POA: Diagnosis not present

## 2015-09-01 MED ORDER — METOCLOPRAMIDE HCL 5 MG/ML IJ SOLN
10.0000 mg | Freq: Once | INTRAMUSCULAR | Status: AC
  Administered 2015-09-01: 10 mg via INTRAVENOUS
  Filled 2015-09-01: qty 2

## 2015-09-01 MED ORDER — SODIUM CHLORIDE 0.9 % IV BOLUS (SEPSIS)
1000.0000 mL | Freq: Once | INTRAVENOUS | Status: AC
  Administered 2015-09-01: 1000 mL via INTRAVENOUS

## 2015-09-01 MED ORDER — GI COCKTAIL ~~LOC~~
30.0000 mL | Freq: Once | ORAL | Status: AC
  Administered 2015-09-01: 30 mL via ORAL
  Filled 2015-09-01: qty 30

## 2015-09-01 MED ORDER — HYDROMORPHONE HCL 1 MG/ML IJ SOLN
1.0000 mg | Freq: Once | INTRAMUSCULAR | Status: AC
  Administered 2015-09-01: 1 mg via INTRAVENOUS
  Filled 2015-09-01: qty 1

## 2015-09-01 NOTE — ED Provider Notes (Signed)
CSN: [id number]     Arrival date & time 09/01/15  2301 History   First MD Initiated Contact with Patient 09/01/15 2312     Chief Complaint  Patient presents with  . Abdominal Pain     (Consider location/radiation/quality/duration/timing/severity/associated sxs/prior Treatment) The history is provided by the patient and the spouse.     Pt p/w intermittent epigastric and RUQ pain that has occurred monthly for the past 9 months.  Associated nausea.  States the pain began today around 4pm, worsened at 7 or 8pm.  Had one loose stool this morning.  Has had this monthly, has been seen in ED x 3, has had upper endoscopy with GI which showed gastroparesis.  Denies fevers, CP, SOB, cough, urinary symptoms.  Denies vomiting.   Takes PPIs, H2 blockers, not 100% compliant but states he does fairly well.  Denies marijuana use.   Past Medical History  Diagnosis Date  . Rotator cuff impingement syndrome of left shoulder 06/19/2015  . Depression   . Osteoarthritis of shoulder     left   Past Surgical History  Procedure Laterality Date  . Clavicle excision    . Appendectomy    . Knee surgery    . Cervical laminectomy    . Hypospadias correction      as child  . Shoulder arthroscopy with rotator cuff repair Left 06/26/2015    Procedure: SHOULDER ARTHROSCOPY WITH ROTATOR CUFF REPAIR;  Surgeon: Elsie Saas, MD;  Location: [facility];  Service: Orthopedics;  Laterality: Left;   Family History  Problem Relation Age of Onset  . Cancer Other   . Hypertension Other   . Diabetes Other    Social History  Substance Use Topics  . Smoking status: Never Smoker   . Smokeless tobacco: Current User    Types: Chew  . Alcohol Use: Yes     Comment: occasional    Review of Systems  All other systems reviewed and are negative.     Allergies  Review of patient's allergies indicates no known allergies.  Home Medications   Prior to Admission medications   Medication Sig Start Date  End Date Taking? Authorizing Provider  Amino Acids (AMINO ACID PO) Take 1 scoop by mouth daily.    Historical Provider, MD  diazepam (VALIUM) 5 MG tablet Take 1 tablet (5 mg total) by mouth every 8 (eight) hours as needed for anxiety. 07/23/15   Dalia Heading, PA-C  famotidine (PEPCID) 20 MG tablet Take 1 tablet (20 mg total) by mouth at bedtime. 08/07/15   Lori P Hvozdovic, PA-C  gabapentin (NEURONTIN) 300 MG capsule Take 300 mg by mouth 3 (three) times daily as needed. Nerve pain 05/28/15   Historical Provider, MD  HYDROcodone-acetaminophen (NORCO/VICODIN) 5-325 MG tablet Take 1 tablet by mouth every 6 (six) hours as needed. Patient taking differently: Take 1 tablet by mouth every 6 (six) hours as needed for moderate pain.  07/26/15   [name], MD  omeprazole (PRILOSEC) 20 MG capsule Take 1 capsule (20 mg total) by mouth daily. Patient not taking: Reported on 08/21/2015 07/26/15   [name], MD  oxyCODONE (ROXICODONE) 5 MG immediate release tablet 1-2 tablets every 4-6 hrs as needed for pain 06/26/15   [name], PA-C  pantoprazole (PROTONIX) 40 MG tablet Take 1 tablet (40 mg total) by mouth 2 (two) times daily. 08/07/15   Lori P Hvozdovic, PA-C   BP 177/165 mmHg  Pulse 105  Temp(Src) 98.1 F (36.7 C) (Oral)  Resp  22  Ht 5\' 8"  (1.727 m)  Wt 108.863 kg  BMI 36.50 kg/m2  SpO2 100% Physical Exam  Constitutional: He appears well-developed and well-nourished. No distress.  HENT:  Head: Normocephalic and atraumatic.  Neck: Neck supple.  Cardiovascular: Normal rate and regular rhythm.   Pulmonary/Chest: Effort normal and breath sounds normal. No respiratory distress. He has no wheezes. He has no rales.  Abdominal: Soft. He exhibits no distension and no mass. There is tenderness in the right upper quadrant and epigastric area. There is no rebound and no guarding.  Neurological: He is alert. He exhibits normal muscle tone.  Skin: He is not diaphoretic.  Nursing note and  vitals reviewed.   ED Course  Procedures (including critical care time) Labs Review Labs Reviewed  COMPREHENSIVE METABOLIC PANEL - Abnormal; Notable for the following:    AST 44 (*)    All other components within normal limits  LIPASE, BLOOD  CBC WITH DIFFERENTIAL/PLATELET    Imaging Review Dg Abd Acute W/chest  09/02/2015  CLINICAL DATA:  Acute onset of mid abdominal pain, radiating to the mid chest, with nausea and shortness of breath. Initial encounter. EXAM: DG ABDOMEN ACUTE W/ 1V CHEST COMPARISON:  Chest radiograph performed 07/26/2015, and CT of the chest, abdomen and pelvis performed 07/23/2015 FINDINGS: The lungs are well-aerated. Mild vascular congestion is noted. There is no evidence of focal opacification, pleural effusion or pneumothorax. The cardiomediastinal silhouette is within normal limits. The visualized bowel gas pattern is unremarkable. Scattered stool and air are seen within the colon; there is no evidence of small bowel dilatation to suggest obstruction. No free intra-abdominal air is identified on the provided upright view. No acute osseous abnormalities are seen; the sacroiliac joints are unremarkable in appearance. Cervical spinal fusion hardware is noted. IMPRESSION: 1. Unremarkable bowel gas pattern; no free intra-abdominal air seen. Small to moderate amount of stool noted in the colon. 2. Mild vascular congestion noted.  Lungs remain grossly clear. Electronically Signed   By: [name] M.D.   On: 09/02/2015 01:02      EKG Interpretation None       1:42 AM Reports great improvement.  Still has residual muscle soreness over abdomen.  Great relief with GI cocktail.   MDM   Final diagnoses:  Recurrent abdominal pain  Nausea  Gastroparesis    Afebrile, nontoxic patient with recurrent upper abdominal pain with nausea.  Improved with meds in ED, particularly with GI cocktail.  Pt is followed by Dr Hilarie Fredrickson, GI.  Has been diagnosed with gastroparesis.  Labs,  xray unremarkable.  D/C home with bentyl, reglan, continued GI follow up.  Discussed result, findings, treatment, and follow up  with patient.  Pt given return precautions.  Pt verbalizes understanding and agrees with plan.         Clayton Bibles, PA-C 09/02/15 [id number]  April Palumbo, MD 09/02/15 [phone]

## 2015-09-01 NOTE — Telephone Encounter (Signed)
Patient's wife called this evening on behalf of the patient. Dr. Hilarie Fredrickson patient. She reports he is having severe abdominal pain for the past 6-7 hours. No vomiting. She reports he is writhing in severe pain and cannot get comfortable, located in the upper abdomen. Pain did not seem associated with eating. He may be having a fever, they are not sure. I reviewed EGD which showed some retained food in the stomach but otherwise biopsies unremarkable for H pylori and no focal ulcerations. She asked if gastroparesis is causing his symptoms. While possible, it seems to me his symptoms of constant severe pain for the past 6 hours without nausea/vomiting are not typical for gastroparesis, although we can evaluate him for this with a gastric emptying study in the future. If he is in that much pain and discomfort, recommend they go to the ER for evaluation and labs, and pain control. He has had some intermittent mild elevations in transaminases previously but US showed a normal GB and prior lipases and CT normal. Symptoms don't sound c/w gastroparesis or biliary colic but it is hard to evaluate this over the phone. He could have abdominal wall pain. She agreed and will go to the ER. Recommend once his acute pain is controlled he follow up in our clinic for reassessment in the near future.   Regina, please see above. Can you see if this patient can get follow up with Dr. Hilarie Fredrickson or one of the PAs in the near future? Thanks

## 2015-09-01 NOTE — ED Notes (Signed)
Pt complains of severe abdominal pain that started about 4pm this afternoon, no vomiting or diarrhea

## 2015-09-01 NOTE — ED Notes (Signed)
Nurse drawing labs. 

## 2015-09-02 LAB — COMPREHENSIVE METABOLIC PANEL
ALBUMIN: 4.6 g/dL (ref 3.5–5.0)
ALT: 50 U/L (ref 17–63)
ANION GAP: 9 (ref 5–15)
AST: 44 U/L — ABNORMAL HIGH (ref 15–41)
Alkaline Phosphatase: 64 U/L (ref 38–126)
BILIRUBIN TOTAL: 0.7 mg/dL (ref 0.3–1.2)
BUN: 14 mg/dL (ref 6–20)
CO2: 24 mmol/L (ref 22–32)
Calcium: 9.1 mg/dL (ref 8.9–10.3)
Chloride: 104 mmol/L (ref 101–111)
Creatinine, Ser: 1.02 mg/dL (ref 0.61–1.24)
GFR calc Af Amer: >60 mL/min (ref >60)
GFR calc non Af Amer: >60 mL/min (ref >60)
GLUCOSE: 98 mg/dL (ref 65–99)
POTASSIUM: 4.2 mmol/L (ref 3.5–5.1)
SODIUM: 137 mmol/L (ref 135–145)
TOTAL PROTEIN: 7.4 g/dL (ref 6.5–8.1)

## 2015-09-02 LAB — CBC WITH DIFFERENTIAL/PLATELET
BASOS PCT: 0 %
Basophils Absolute: 0 10*3/uL (ref 0.0–0.1)
EOS ABS: 0.1 10*3/uL (ref 0.0–0.7)
EOS PCT: 1 %
HCT: 45 % (ref 39.0–52.0)
HEMOGLOBIN: 15.1 g/dL (ref 13.0–17.0)
LYMPHS ABS: 3.5 10*3/uL (ref 0.7–4.0)
Lymphocytes Relative: 46 %
MCH: 30.3 pg (ref 26.0–34.0)
MCHC: 33.6 g/dL (ref 30.0–36.0)
MCV: 90.2 fL (ref 78.0–100.0)
MONOS PCT: 6 %
Monocytes Absolute: 0.5 10*3/uL (ref 0.1–1.0)
NEUTROS ABS: 3.6 10*3/uL (ref 1.7–7.7)
NEUTROS PCT: 47 %
Platelets: 295 10*3/uL (ref 150–400)
RBC: 4.99 MIL/uL (ref 4.22–5.81)
RDW: 12.4 % (ref 11.5–15.5)
WBC: 7.7 10*3/uL (ref 4.0–10.5)

## 2015-09-02 LAB — LIPASE, BLOOD: Lipase: 50 U/L (ref 11–51)

## 2015-09-02 MED ORDER — METOCLOPRAMIDE HCL 10 MG PO TABS: 10.0000 mg | ORAL_TABLET | Freq: Three times a day (TID) | ORAL | Status: DC | PRN

## 2015-09-02 MED ORDER — HYDROMORPHONE HCL 1 MG/ML IJ SOLN
1.0000 mg | Freq: Once | INTRAMUSCULAR | Status: AC
  Administered 2015-09-02: 1 mg via INTRAVENOUS
  Filled 2015-09-02: qty 1

## 2015-09-02 MED ORDER — DICYCLOMINE HCL 20 MG PO TABS: 20.0000 mg | ORAL_TABLET | Freq: Two times a day (BID) | ORAL | Status: DC | PRN

## 2015-09-02 MED ORDER — DIAZEPAM 5 MG PO TABS
5.0000 mg | ORAL_TABLET | Freq: Once | ORAL | Status: AC
  Administered 2015-09-02: 5 mg via ORAL
  Filled 2015-09-02: qty 1

## 2015-09-02 NOTE — Discharge Instructions (Signed)
Read the information below.  Use the prescribed medication as directed.  Please discuss all new medications with your pharmacist.  You may return to the Emergency Department at any time for worsening condition or any new symptoms that concern you.    If you develop high fevers, worsening abdominal pain, uncontrolled vomiting, or are unable to tolerate fluids by mouth, return to the ER for a recheck.     Abdominal Pain, Adult Many things can cause abdominal pain. Usually, abdominal pain is not caused by a disease and will improve without treatment. It can often be observed and treated at home. Your health care provider will do a physical exam and possibly order blood tests and X-rays to help determine the seriousness of your pain. However, in many cases, more time must pass before a clear cause of the pain can be found. Before that point, your health care provider may not know if you need more testing or further treatment. HOME CARE INSTRUCTIONS Monitor your abdominal pain for any changes. The following actions may help to alleviate any discomfort you are experiencing:  Only take over-the-counter or prescription medicines as directed by your health care provider.  Do not take laxatives unless directed to do so by your health care provider.  Try a clear liquid diet (broth, tea, or water) as directed by your health care provider. Slowly move to a bland diet as tolerated. SEEK MEDICAL CARE IF:  You have unexplained abdominal pain.  You have abdominal pain associated with nausea or diarrhea.  You have pain when you urinate or have a bowel movement.  You experience abdominal pain that wakes you in the night.  You have abdominal pain that is worsened or improved by eating food.  You have abdominal pain that is worsened with eating fatty foods.  You have a fever. SEEK IMMEDIATE MEDICAL CARE IF:  Your pain does not go away within 2 hours.  You keep throwing up (vomiting).  Your pain is felt  only in portions of the abdomen, such as the right side or the left lower portion of the abdomen.  You pass bloody or black tarry stools. MAKE SURE YOU:  Understand these instructions.  Will watch your condition.  Will get help right away if you are not doing well or get worse.   This information is not intended to replace advice given to you by your health care provider. Make sure you discuss any questions you have with your health care provider.   Document Released: 05/28/2005 Document Revised: 05/09/2015 Document Reviewed: 04/27/2013 Elsevier Interactive Patient Education 2016 Elsevier Inc[employer]  Gastroparesis Gastroparesis, also called delayed gastric emptying, is a condition in which food takes longer than normal to empty from the stomach. The condition is usually long-lasting (chronic). CAUSES This condition may be caused by:  An endocrine disorder, such as hypothyroidism or diabetes. Diabetes is the most common cause of this condition.  A nervous system disease, such as Parkinson disease or multiple sclerosis.  Cancer, infection, or surgery of the stomach or vagus nerve.  A connective tissue disorder, such as scleroderma.  Certain medicines. In most cases, the cause is not known. RISK FACTORS This condition is more likely to develop in:  People with certain disorders, including endocrine disorders, eating disorders, amyloidosis, and scleroderma.  People with certain diseases, including Parkinson disease or multiple sclerosis.  People with cancer or infection of the stomach or vagus nerve.  People who have had surgery on the stomach or vagus nerve.  People  who take certain medicines.  Women. SYMPTOMS Symptoms of this condition include:  An early feeling of fullness when eating.  Nausea.  Weight loss.  Vomiting.  Heartburn.  Abdominal bloating.  Inconsistent blood glucose levels.  Lack of appetite.  Acid from the stomach coming up into the esophagus  (gastroesophageal reflux).  Spasms of the stomach. Symptoms may come and go. DIAGNOSIS This condition is diagnosed with tests, such as:  Tests that check how long it takes food to move through the stomach and intestines. These tests include:  Upper gastrointestinal (GI) series. In this test, X-rays of the intestines are taken after you drink a liquid. The liquid makes the intestines show up better on the X-rays.  Gastric emptying scintigraphy. In this test, scans are taken after you eat food that contains a small amount of radioactive material.  Wireless capsule GI monitoring system. This test involves swallowing a capsule that records information about movement through the stomach.  Gastric manometry. This test measures electrical and muscular activity in the stomach. It is done with a thin tube that is passed down the throat and into the stomach.  Endoscopy. This test checks for abnormalities in the lining of the stomach. It is done with a long, thin tube that is passed down the throat and into the stomach.  An ultrasound. This test can help rule out gallbladder disease or pancreatitis as a cause of your symptoms. It uses sound waves to take pictures of the inside of your body. TREATMENT There is no cure for gastroparesis. This condition may be managed with:  Treatment of the underlying condition causing the gastroparesis.  Lifestyle changes, including exercise and dietary changes. Dietary changes can include:  Changes in what and when you eat.  Eating smaller meals more often.  Eating low-fat foods.  Eating low-fiber forms of high-fiber foods, such as cooked vegetables instead of raw vegetables.  Having liquid foods in place of solid foods. Liquid foods are easier to digest.  Medicines. These may be given to control nausea and vomiting and to stimulate stomach muscles.  Getting food through a feeding tube. This may be done in severe cases.  A gastric neurostimulator. This  is a device that is inserted into the body with surgery. It helps improve stomach emptying and control nausea and vomiting. HOME CARE INSTRUCTIONS  Follow your health care provider's instructions about exercise and diet.  Take medicines only as directed by your health care provider. SEEK MEDICAL CARE IF:  Your symptoms do not improve with treatment.  You have new symptoms. SEEK IMMEDIATE MEDICAL CARE IF:  You have severe abdominal pain that does not improve with treatment.  You have nausea that does not go away.  You cannot keep fluids down.   This information is not intended to replace advice given to you by your health care provider. Make sure you discuss any questions you have with your health care provider.   Document Released: 08/18/2005 Document Revised: 01/02/2015 Document Reviewed: 08/14/2014 Elsevier Interactive Patient Education [employer].

## 2015-09-04 ENCOUNTER — Telehealth: Payer: Self-pay | Admitting: Internal Medicine

## 2015-09-04 NOTE — Telephone Encounter (Signed)
Pt scheduled to see Nicoletta Ba PA tomorrow at 10am, pt aware of appt.

## 2015-09-04 NOTE — Telephone Encounter (Signed)
Left a message for patient to call back. 

## 2015-09-05 ENCOUNTER — Ambulatory Visit (INDEPENDENT_AMBULATORY_CARE_PROVIDER_SITE_OTHER)
Admission: RE | Admit: 2015-09-05 | Discharge: 2015-09-05 | Disposition: A | Discharge status: Home / Self Care | Payer: Commercial Managed Care - HMO | Source: Ambulatory Visit | Attending: Internal Medicine | Admitting: Internal Medicine

## 2015-09-05 ENCOUNTER — Telehealth: Payer: Self-pay

## 2015-09-05 ENCOUNTER — Ambulatory Visit (INDEPENDENT_AMBULATORY_CARE_PROVIDER_SITE_OTHER): Payer: Commercial Managed Care - HMO | Admitting: Physician Assistant

## 2015-09-05 ENCOUNTER — Encounter: Payer: Self-pay | Admitting: Physician Assistant

## 2015-09-05 VITALS — BP 140/90 | HR 73 | Ht 68.0 in | Wt 253.0 lb

## 2015-09-05 DIAGNOSIS — R1012 Left upper quadrant pain: Secondary | ICD-10-CM | POA: Diagnosis not present

## 2015-09-05 DIAGNOSIS — R1010 Upper abdominal pain, unspecified: Secondary | ICD-10-CM

## 2015-09-05 DIAGNOSIS — R110 Nausea: Secondary | ICD-10-CM | POA: Diagnosis not present

## 2015-09-05 DIAGNOSIS — R1011 Right upper quadrant pain: Secondary | ICD-10-CM | POA: Diagnosis not present

## 2015-09-05 MED ORDER — IOHEXOL 300 MG/ML  SOLN
100.0000 mL | Freq: Once | INTRAMUSCULAR | Status: AC | PRN
  Administered 2015-09-05: 100 mL via INTRAVENOUS

## 2015-09-05 MED ORDER — ONDANSETRON HCL 4 MG PO TABS: 4.0000 mg | ORAL_TABLET | Freq: Four times a day (QID) | ORAL | Status: DC | PRN

## 2015-09-05 NOTE — Telephone Encounter (Signed)
Ct of abdomen/pelvis and HIDA scan ordered under Dr. Blanch Media name by mistake.  They should be under Amy Esterwoods name

## 2015-09-05 NOTE — Patient Instructions (Addendum)
We have sent the following medications to your pharmacy for you to pick up at your convenience:  Zofran    You have been scheduled for a CT scan of the abdomen and pelvis at [facility] ([address]---this is in the same building as [facility]).   You are scheduled on 09/05/2015 at 1:45pm. You should arrive 15 minutes prior to your appointment time for registration. Please follow the written instructions below on the day of your exam:  WARNING: IF YOU ARE ALLERGIC TO IODINE/X-RAY DYE, PLEASE NOTIFY RADIOLOGY IMMEDIATELY AT [phone]! YOU WILL BE GIVEN A 13 HOUR PREMEDICATION PREP.  1) Do not eat or drink anything after:  Starting now (4 hours prior to your test) 2) You have been given 2 bottles of oral contrast to drink. The solution may taste  better if refrigerated, but do NOT add ice or any other liquid to this solution. Shake  well before drinking.    Drink 1 bottle of contrast @ 11:45am (2 hours prior to your exam)  Drink 1 bottle of contrast @ 12:45pm (1 hour prior to your exam)  You may take any medications as prescribed with a small amount of water except for the following: Metformin, Glucophage, Glucovance, Avandamet, Riomet, Fortamet, Actoplus Met, Janumet, Glumetza or Metaglip. The above medications must be held the day of the exam AND 48 hours after the exam.  The purpose of you drinking the oral contrast is to aid in the visualization of your intestinal tract. The contrast solution may cause some diarrhea. Before your exam is started, you will be given a small amount of fluid to drink. Depending on your individual set of symptoms, you may also receive an intravenous injection of x-ray contrast/dye. Plan on being at [facility] for 30 minutes or longer, depending on the type of exam you are having performed.    This test typically takes 30-45 minutes to complete.  If you have any questions regarding your exam or if you need to reschedule, you may  call the CT department at [phone] between the hours of 8:00 am and 5:00 pm, Monday-Friday.  ________________________________________________________________________  Joshua Parrish have been scheduled for a HIDA scan at [facility] (1st floor) on 09/10/2015. Please arrive 15 minutes prior to your scheduled appointment at 6:21HY. Make certain not to have anything to eat or drink at least 6 hours prior to your test. Should this appointment date or time not work well for you, please call radiology scheduling at [phone] _____________________________________________________________________ hepatobiliary (HIDA) scan is an imaging procedure used to diagnose problems in the liver, gallbladder and bile ducts. In the HIDA scan, a radioactive chemical or tracer is injected into a vein in your arm. The tracer is handled by the liver like bile. Bile is a fluid produced and excreted by your liver that helps your digestive system break down fats in the foods you eat. Bile is stored in your gallbladder and the gallbladder releases the bile when you eat a meal. A special nuclear medicine scanner (gamma camera) tracks the flow of the tracer from your liver into your gallbladder and small intestine.  During your HIDA scan  You'll be asked to change into a hospital gown before your HIDA scan begins. Your health care team will position you on a table, usually on your back. The radioactive tracer is then injected into a vein in your arm.The tracer travels through your bloodstream to your liver, where it's taken up by the bile-producing cells. The  radioactive tracer travels with the bile from your liver into your gallbladder and through your bile ducts to your small intestine.You may feel some pressure while the radioactive tracer is injected into your vein. As you lie on the table, a special gamma camera is positioned over your abdomen taking pictures of the tracer as it moves through your body. The gamma camera takes  pictures continually for about an hour. You'll need to keep still during the HIDA scan. This can become uncomfortable, but you may find that you can lessen the discomfort by taking deep breaths and thinking about other things. Tell your health care team if you're uncomfortable. The radiologist will watch on a computer the progress of the radioactive tracer through your body. The HIDA scan may be stopped when the radioactive tracer is seen in the gallbladder and enters your small intestine. This typically takes about an hour. In some cases extra imaging will be performed if original images aren't satisfactory, if morphine is given to help visualize the gallbladder or if the medication CCK is given to look at the contraction of the gallbladder. This test typically takes 2 hours to complete. ________________________________________________________________________

## 2015-09-05 NOTE — Progress Notes (Addendum)
Patient ID: Joshua Parrish, male   DOB: 01-Jul-1976, 40 y.o.   MRN: [id number]   Subjective:    Patient ID: Joshua Parrish, male    DOB: [birth year], 40 y.o.   MRN: [id number]  HPI  Joshua Parrish is a 40 year old white male recently established with Dr.Pyrtle. He was initially seen in the office on 08/07/2015 by Joshua Rubin Hvozdovic  PA-C with complaints of epigastric pain. He had had upper abdominal ultrasound done in November 2016 which was negative with the exception of fatty liver. Spell 2 may be having poorly controlled reflux. He was started on a twice a day PPI and scheduled for upper endoscopy. He had EGD with Dr.Pyrtle showing retained food in the stomach concerning for gastroparesis but no other abnormalities. He was placed on a gastroparesis diet asked to avoid narcotics. Biopsies were done showing a reactive gastropathy no H. pylori. He had another ER visit on December 31 with a severe "attack of upper abdominal pain and nausea. At that time pain medication and a GI cocktail helped his symptoms. Patient had CT angio of the chest done November 2016 also during an ER visit with similar complaints and this was negative. Visualized portion of the upper abdomen was read as negative. Patient says his initial symptoms started in June or July with discrete "attacks" of upper abdominal pain. He says during these attacks of pain is severe and he is unable to do anything until the pain subsides. These are associated with nausea sometimes sweating no vomiting. They will last usually for at least an hour and sometimes several hours with pain radiating across bilaterally in the upper abdomen around to the back. In November 2016 these episodes became closer together and more constant. He says it starts as a "stomachache" feeling then a knotted up at pain in his abdomen and bloating distention nausea and no vomiting. He had another attack on December 31 for which she went to the emergency room as outlined above. Since that time he had  smaller attacks on January 1 in January 2 in the middle of the night he was awakened with a hard pain that lasted from 2 AM until 8 AM and then gradually subsided. Today he feels another attack imminent. He is hurting rather diffusely and afraid to eat. He says she's been having some loose stools no fever or chills but did have some sweats this morning with the pain.  Review of Systems Pertinent positive and negative review of systems were noted in the above HPI section.  All other review of systems was otherwise negative.  Outpatient Encounter Prescriptions as of 09/05/2015  Medication Sig  . Amino Acids (AMINO ACID PO) Take 1 scoop by mouth daily.  . diazepam (VALIUM) 5 MG tablet Take 1 tablet (5 mg total) by mouth every 8 (eight) hours as needed for anxiety.  . dicyclomine (BENTYL) 20 MG tablet Take 1 tablet (20 mg total) by mouth 2 (two) times daily as needed for spasms (abdominal pain).  Marland Kitchen gabapentin (NEURONTIN) 300 MG capsule Take 300 mg by mouth 3 (three) times daily as needed. Nerve pain  . metoCLOPramide (REGLAN) 10 MG tablet Take 1 tablet (10 mg total) by mouth every 8 (eight) hours as needed for nausea or vomiting.  . pantoprazole (PROTONIX) 40 MG tablet Take 1 tablet (40 mg total) by mouth 2 (two) times daily.  . Simethicone (MYLANTA GAS PO) Take 1 Dose by mouth as needed.  . [DISCONTINUED] omeprazole (PRILOSEC) 20 MG capsule Take 1 capsule (  20 mg total) by mouth daily.  . ondansetron (ZOFRAN) 4 MG tablet Take 1 tablet (4 mg total) by mouth every 6 (six) hours as needed for nausea or vomiting.  . [DISCONTINUED] famotidine (PEPCID) 20 MG tablet Take 1 tablet (20 mg total) by mouth at bedtime.  . [DISCONTINUED] HYDROcodone-acetaminophen (NORCO/VICODIN) 5-325 MG tablet Take 1 tablet by mouth every 6 (six) hours as needed. (Patient taking differently: Take 1 tablet by mouth every 6 (six) hours as needed for moderate pain. )  . [DISCONTINUED] oxyCODONE (ROXICODONE) 5 MG immediate release tablet  1-2 tablets every 4-6 hrs as needed for pain   No facility-administered encounter medications on file as of 09/05/2015.   No Known Allergies Patient Active Problem List   Diagnosis Date Noted  . Rotator cuff impingement syndrome of left shoulder 06/19/2015  . Acromioclavicular joint arthritis 06/19/2015   Social History   Social History  . Marital Status: Divorced    Spouse Name: N/A  . Number of Children: N/A  . Years of Education: N/A   Occupational History  . Not on file.   Social History Main Topics  . Smoking status: Never Smoker   . Smokeless tobacco: Current User    Types: Chew  . Alcohol Use: 0.0 oz/week    0 Standard drinks or equivalent per week     Comment: occasional  . Drug Use: No  . Sexual Activity: Not on file   Other Topics Concern  . Not on file   Social History Narrative    [name] family history includes Cancer in his other; Diabetes in his other; Hypertension in his other.      Objective:    Filed Vitals:   09/05/15 0957  BP: 140/90  Pulse: 73    Physical Exam  well-developed white male in no acute distress accompanied by his wife, uncomfortable-appearing blood pressure 140/90 pulse 73 height 5 foot 8 weight 253. HEENT; nontraumatic normocephalic EOMI PERRLA sclera anicteric, Cardiovascular ;regular rate and rhythm with S1-S2 no murmur or gallop, Pulmonary; clear bilaterally, Abdomen; soft bowel sounds are present he has diffuse mild tenderness and more marked tenderness across the hypogastrium with some guarding no rebound no palpable mass or hepatosplenomegaly, Rectal; exam not done, Ext; no clubbing cyanosis or edema skin warm and dry, Neuropsych; mood and affect appropriate       Assessment & Plan:   #1 40 yo male with episodic severe upper abdominal pain radiating around into the back associated with nausea and intermittent sweats. Patient has had intermittent "attacks" over the past 6 months definitely worse over the past 1 month  increasing in frequency and severity. He had an episode 09/01/2015 that took him to the emergency room. He has had an episode daily since and more intense episode onset this morning Etiology of his pain is not clear, am concerned he may  be having intermittent partial obstruction, intussusception, question acalculous cholecystitis or biliary dyskinesia  Plan; labs were done 09/01/2015 will not repeat today-unrevealing We'll call in Zofran 4 mg every 6 hours when necessary for nausea Continue Protonix 40 mg by mouth twice a day Obtain CT scan today abdomen and pelvis with contrast while he is having an acute episode. If this is negative will pursue CCK HIDA scan.   Dorthie Santini S Huel Centola PA-C 09/05/2015   Cc: No ref. provider found  Addendum: Reviewed and agree with ongoing management. Jerene Bears, MD

## 2015-09-05 NOTE — Telephone Encounter (Signed)
Patient scheduled with Nicoletta Ba, PA per Rosanne Sack, RN.

## 2015-09-10 ENCOUNTER — Other Ambulatory Visit: Payer: Self-pay

## 2015-09-10 ENCOUNTER — Ambulatory Visit (HOSPITAL_COMMUNITY)
Admission: RE | Admit: 2015-09-10 | Discharge: 2015-09-10 | Disposition: A | Discharge status: Home / Self Care | Payer: Commercial Managed Care - HMO | Source: Ambulatory Visit | Attending: Internal Medicine | Admitting: Internal Medicine

## 2015-09-10 DIAGNOSIS — R110 Nausea: Secondary | ICD-10-CM | POA: Diagnosis not present

## 2015-09-10 DIAGNOSIS — R1084 Generalized abdominal pain: Secondary | ICD-10-CM

## 2015-09-10 DIAGNOSIS — R1010 Upper abdominal pain, unspecified: Secondary | ICD-10-CM | POA: Diagnosis present

## 2015-09-10 MED ORDER — SINCALIDE 5 MCG IJ SOLR: 2.0000 ug | Freq: Once | INTRAMUSCULAR | Status: DC

## 2015-09-10 MED ORDER — TECHNETIUM TC 99M MEBROFENIN IV KIT
5.4000 | PACK | Freq: Once | INTRAVENOUS | Status: AC | PRN
  Administered 2015-09-10: 5 via INTRAVENOUS

## 2015-09-18 ENCOUNTER — Encounter (HOSPITAL_COMMUNITY)
Admission: RE | Admit: 2015-09-18 | Discharge: 2015-09-18 | Disposition: A | Discharge status: Home / Self Care | Payer: Commercial Managed Care - HMO | Source: Ambulatory Visit | Attending: Internal Medicine | Admitting: Internal Medicine

## 2015-09-18 DIAGNOSIS — R1084 Generalized abdominal pain: Secondary | ICD-10-CM | POA: Diagnosis present

## 2015-09-18 MED ORDER — TECHNETIUM TC 99M SULFUR COLLOID
2.1000 | Freq: Once | INTRAVENOUS | Status: AC | PRN
  Administered 2015-09-18: 2.1 via ORAL

## 2015-10-05 ENCOUNTER — Encounter: Payer: Self-pay | Admitting: *Deleted

## 2015-10-15 ENCOUNTER — Ambulatory Visit (INDEPENDENT_AMBULATORY_CARE_PROVIDER_SITE_OTHER): Payer: Commercial Managed Care - HMO | Admitting: Internal Medicine

## 2015-10-15 ENCOUNTER — Other Ambulatory Visit (INDEPENDENT_AMBULATORY_CARE_PROVIDER_SITE_OTHER): Payer: Commercial Managed Care - HMO

## 2015-10-15 ENCOUNTER — Encounter: Payer: Self-pay | Admitting: Internal Medicine

## 2015-10-15 VITALS — BP 146/84 | HR 88 | Ht 68.0 in | Wt 262.0 lb

## 2015-10-15 DIAGNOSIS — R1013 Epigastric pain: Secondary | ICD-10-CM

## 2015-10-15 DIAGNOSIS — R194 Change in bowel habit: Secondary | ICD-10-CM

## 2015-10-15 DIAGNOSIS — R140 Abdominal distension (gaseous): Secondary | ICD-10-CM

## 2015-10-15 DIAGNOSIS — K589 Irritable bowel syndrome without diarrhea: Secondary | ICD-10-CM | POA: Diagnosis not present

## 2015-10-15 LAB — COMPREHENSIVE METABOLIC PANEL
ALBUMIN: 4.9 g/dL (ref 3.5–5.2)
ALT: 42 U/L (ref 0–53)
AST: 22 U/L (ref 0–37)
Alkaline Phosphatase: 62 U/L (ref 39–117)
BUN: 13 mg/dL (ref 6–23)
CHLORIDE: 103 meq/L (ref 96–112)
CO2: 29 meq/L (ref 19–32)
Calcium: 10 mg/dL (ref 8.4–10.5)
Creatinine, Ser: 0.93 mg/dL (ref 0.40–1.50)
GFR: 95.42 mL/min (ref >60.00)
GLUCOSE: 101 mg/dL — AB (ref 70–99)
POTASSIUM: 4 meq/L (ref 3.5–5.1)
Sodium: 139 mEq/L (ref 135–145)
Total Bilirubin: 0.3 mg/dL (ref 0.2–1.2)
Total Protein: 7.8 g/dL (ref 6.0–8.3)

## 2015-10-15 LAB — CBC WITH DIFFERENTIAL/PLATELET
BASOS PCT: 0.4 % (ref 0.0–3.0)
Basophils Absolute: 0 10*3/uL (ref 0.0–0.1)
EOS PCT: 1.5 % (ref 0.0–5.0)
Eosinophils Absolute: 0.1 10*3/uL (ref 0.0–0.7)
HCT: 44.8 % (ref 39.0–52.0)
Hemoglobin: 15.3 g/dL (ref 13.0–17.0)
LYMPHS ABS: 3.1 10*3/uL (ref 0.7–4.0)
Lymphocytes Relative: 36.3 % (ref 12.0–46.0)
MCHC: 34.3 g/dL (ref 30.0–36.0)
MCV: 87.5 fl (ref 78.0–100.0)
MONO ABS: 0.5 10*3/uL (ref 0.1–1.0)
Monocytes Relative: 6.3 % (ref 3.0–12.0)
NEUTROS ABS: 4.7 10*3/uL (ref 1.4–7.7)
NEUTROS PCT: 55.5 % (ref 43.0–77.0)
PLATELETS: 273 10*3/uL (ref 150.0–400.0)
RBC: 5.12 Mil/uL (ref 4.22–5.81)
RDW: 12.7 % (ref 11.5–15.5)
WBC: 8.5 10*3/uL (ref 4.0–10.5)

## 2015-10-15 LAB — IGA: IgA: 145 mg/dL (ref 68–378)

## 2015-10-15 MED ORDER — RIFAXIMIN 550 MG PO TABS: 550.0000 mg | ORAL_TABLET | Freq: Three times a day (TID) | ORAL | Status: DC

## 2015-10-15 MED ORDER — NA SULFATE-K SULFATE-MG SULF 17.5-3.13-1.6 GM/177ML PO SOLN: ORAL | Status: DC

## 2015-10-15 NOTE — Patient Instructions (Signed)
Your physician has requested that you go to the basement for the following lab work before leaving today: Celiac, cbc, cmp  You have been scheduled for a colonoscopy. Please follow written instructions given to you at your visit today.  Please pick up your prep supplies at the pharmacy within the next 1-3 days. If you use inhalers (even only as needed), please bring them with you on the day of your procedure. Your physician has requested that you go to www.startemmi.com and enter the access code given to you at your visit today. This web site gives a general overview about your procedure. However, you should still follow specific instructions given to you by our office regarding your preparation for the procedure.  We have sent your demographic information and a prescription for Xifaxan to [facility]. This pharmacy is able to get medication approved through insurance and get you the lowest copay possible. If you have not heard from them within 1 week, please call our office at [phone] to let us know.

## 2015-10-15 NOTE — Progress Notes (Signed)
Subjective:    Patient ID: Joshua Parrish, male    DOB: 03/17/1976, 40 y.o.   MRN: [id number]  HPI Joshua Parrish is a 40 year old male initially seen in December to evaluate epigastric abdominal pain who is here for follow-up. His workup to this point has been extensive but he continues to have issues with upper abdominal pain. This comes and "attacks" and feels "spastic" to him. Symptoms can last several hours and are associated with an upper abdominal pressure and pain. Last episode was last night at 3 AM and lasted 3 hours. He took pantoprazole, Mylanta and Tums and symptoms slowly resolved. He does report feeling fullness across the abdomen and particularly bloated. This can be uncomfortable. He also has had some early satiety and feels full quickly. He has had a change in bowel habit and also increased gas. He reports previously he was regular going once a day with formed brown stool most recently he's having "explosive" stool which is loose and can occur several times throughout the day but then no stool for 2 or 3 days. He denies rectal bleeding or melena.  Of note since January 2016 he's lost about 100 pounds which she relates to cervical disc surgery. After the surgery he had a hard time swallowing and so he dramatically reduced his diet and also started working out.  Several weeks ago he stopped all medications to see what symptoms he was having. Of note he had no response with trial of Reglan after EGD suggested gastroparesis. Subsequent gastric empty study off Reglan was normal  Workup has been as follows: --Abdominal ultrasound showed fatty liver otherwise negative --Gastric emptying study normal --HIDA scan with CCK normal --CT scan abdomen and pelvis with contrast tiny nonobstructing calculus in the lower pole of the left kidney. Several small prostatic colliculi. Otherwise normal --CTA chest -- normal --EGD slightly irregular Z line, retained food suggesting gastroparesis, normal duodenum.  Biopsies reactive gastropathy negative for H. pylori, benign squamous glandular mucosa with chronic inflammation, no metaplasia  Review of Systems As per history of present illness, otherwise negative  Current Medications, Allergies, Past Medical History, Past Surgical History, Family History and Social History were reviewed in [employer] record.     Objective:   Physical Exam BP 146/84 mmHg  Pulse 88  Ht 5\' 8"  (1.727 m)  Wt 262 lb (118.842 kg)  BMI 39.85 kg/m2 Constitutional: Well-developed and well-nourished. No distress. HEENT: Normocephalic and atraumatic. Oropharynx is clear and moist. No oropharyngeal exudate. Conjunctivae are normal.  No scleral icterus. Neck: Neck supple. Trachea midline. Cardiovascular: Normal rate, regular rhythm and intact distal pulses. No M/R/G Pulmonary/chest: Effort normal and breath sounds normal. No wheezing, rales or rhonchi. Abdominal: Soft, diffuse tenderness throughout the abdomen without rebound or guarding, obese nondistended. Bowel sounds active throughout. There are no masses palpable.  Extremities: no clubbing, cyanosis, or edema Neurological: Alert and oriented to person place and time. Skin: Skin is warm and dry. Psychiatric: Normal mood and affect. Behavior is normal.  CBC    Component Value Date/Time   WBC 8.5 10/15/2015 1458   RBC 5.12 10/15/2015 1458   HGB 15.3 10/15/2015 1458   HCT 44.8 10/15/2015 1458   PLT 273.0 10/15/2015 1458   MCV 87.5 10/15/2015 1458   MCH 30.3 09/01/2015 2356   MCHC 34.3 10/15/2015 1458   RDW 12.7 10/15/2015 1458   LYMPHSABS 3.1 10/15/2015 1458   MONOABS 0.5 10/15/2015 1458   EOSABS 0.1 10/15/2015 1458   BASOSABS  0.0 10/15/2015 1458    CMP     Component Value Date/Time   NA 139 10/15/2015 1458   K 4.0 10/15/2015 1458   CL 103 10/15/2015 1458   CO2 29 10/15/2015 1458   GLUCOSE 101* 10/15/2015 1458   BUN 13 10/15/2015 1458   CREATININE 0.93 10/15/2015 1458   CALCIUM  10.0 10/15/2015 1458   PROT 7.8 10/15/2015 1458   ALBUMIN 4.9 10/15/2015 1458   AST 22 10/15/2015 1458   ALT 42 10/15/2015 1458   ALKPHOS 62 10/15/2015 1458   BILITOT 0.3 10/15/2015 1458   GFRNONAA >60 09/01/2015 2356   GFRAA >60 09/01/2015 2356    Lipase     Component Value Date/Time   LIPASE 50 09/01/2015 2356      Assessment & Plan:  40 year old male initially seen in December to evaluate epigastric abdominal pain who is here for follow-up.   1. Epigastric abd pain/change in bowel habits/bloating -- colonoscopy recommended given change in bowel habits. Exclude IBD. We discussed the risks, benefits and alternatives and he is agreeable to proceed. Check celiac panel today. If colonoscopy unrevealing I recommend rifaximin 550 mg 3 times a day 14 days for IBS-D which would also treat SIBO. If remaining workup is unrevealing, I would recommend surgical referral for consideration of cholecystectomy despite normal ultrasound and HIDA scan given pain is described above. Significant weight loss also puts him at risk for gallbladder sludge/stones. Check TSH

## 2015-10-16 ENCOUNTER — Encounter: Payer: Self-pay | Admitting: Internal Medicine

## 2015-10-16 ENCOUNTER — Telehealth: Payer: Self-pay | Admitting: Internal Medicine

## 2015-10-16 ENCOUNTER — Ambulatory Visit (AMBULATORY_SURGERY_CENTER): Payer: Commercial Managed Care - HMO | Admitting: Internal Medicine

## 2015-10-16 ENCOUNTER — Other Ambulatory Visit (INDEPENDENT_AMBULATORY_CARE_PROVIDER_SITE_OTHER): Payer: Commercial Managed Care - HMO

## 2015-10-16 VITALS — BP 146/97 | HR 70 | Temp 98.4°F | Resp 20 | Ht 68.0 in | Wt 262.0 lb

## 2015-10-16 DIAGNOSIS — D124 Benign neoplasm of descending colon: Secondary | ICD-10-CM

## 2015-10-16 DIAGNOSIS — R140 Abdominal distension (gaseous): Secondary | ICD-10-CM | POA: Diagnosis not present

## 2015-10-16 DIAGNOSIS — R1013 Epigastric pain: Secondary | ICD-10-CM | POA: Diagnosis not present

## 2015-10-16 DIAGNOSIS — R194 Change in bowel habit: Secondary | ICD-10-CM | POA: Diagnosis not present

## 2015-10-16 DIAGNOSIS — D123 Benign neoplasm of transverse colon: Secondary | ICD-10-CM

## 2015-10-16 DIAGNOSIS — K589 Irritable bowel syndrome without diarrhea: Secondary | ICD-10-CM

## 2015-10-16 LAB — TISSUE TRANSGLUTAMINASE, IGA: TISSUE TRANSGLUTAMINASE AB, IGA: 1 U/mL (ref <4)

## 2015-10-16 LAB — TSH: TSH: 2.94 u[IU]/mL (ref 0.35–4.50)

## 2015-10-16 MED ORDER — SODIUM CHLORIDE 0.9 % IV SOLN: 500.0000 mL | INTRAVENOUS | Status: DC

## 2015-10-16 NOTE — Telephone Encounter (Signed)
Noted  

## 2015-10-16 NOTE — Patient Instructions (Signed)
YOU HAD AN ENDOSCOPIC PROCEDURE TODAY AT THE [facility]:   Refer to the procedure report that was given to you for any specific questions about what was found during the examination.  If the procedure report does not answer your questions, please call your gastroenterologist to clarify.  If you requested that your care partner not be given the details of your procedure findings, then the procedure report has been included in a sealed envelope for you to review at your convenience later.  YOU SHOULD EXPECT: Some feelings of bloating in the abdomen. Passage of more gas than usual.  Walking can help get rid of the air that was put into your GI tract during the procedure and reduce the bloating. If you had a lower endoscopy (such as a colonoscopy or flexible sigmoidoscopy) you may notice spotting of blood in your stool or on the toilet paper. If you underwent a bowel prep for your procedure, you may not have a normal bowel movement for a few days.  Please Note:  You might notice some irritation and congestion in your nose or some drainage.  This is from the oxygen used during your procedure.  There is no need for concern and it should clear up in a day or so.  SYMPTOMS TO REPORT IMMEDIATELY:   Following lower endoscopy (colonoscopy or flexible sigmoidoscopy):  Excessive amounts of blood in the stool  Significant tenderness or worsening of abdominal pains  Swelling of the abdomen that is new, acute  Fever of 100F or higher   For urgent or emergent issues, a gastroenterologist can be reached at any hour by calling [phone].   DIET: Your first meal following the procedure should be a small meal and then it is ok to progress to your normal diet. Heavy or fried foods are harder to digest and may make you feel nauseous or bloated.  Likewise, meals heavy in dairy and vegetables can increase bloating.  Drink plenty of fluids but you should avoid alcoholic beverages for 24  hours.  ACTIVITY:  You should plan to take it easy for the rest of today and you should NOT DRIVE or use heavy machinery until tomorrow (because of the sedation medicines used during the test).    FOLLOW UP: Our staff will call the number listed on your records the next business day following your procedure to check on you and address any questions or concerns that you may have regarding the information given to you following your procedure. If we do not reach you, we will leave a message.  However, if you are feeling well and you are not experiencing any problems, there is no need to return our call.  We will assume that you have returned to your regular daily activities without incident.  If any biopsies were taken you will be contacted by phone or by letter within the next 1-3 weeks.  Please call us at [phone] if you have not heard about the biopsies in 3 weeks.    SIGNATURES/CONFIDENTIALITY: You and/or your care partner have signed paperwork which will be entered into your electronic medical record.  These signatures attest to the fact that that the information above on your After Visit Summary has been reviewed and is understood.  Full responsibility of the confidentiality of this discharge information lies with you and/or your care-partner.   Resume medications. Information given on polyps,hemorrhoids and high fiber diet. 

## 2015-10-16 NOTE — Progress Notes (Signed)
Called to room to assist during endoscopic procedure.  Patient ID and intended procedure confirmed with present staff. Received instructions for my participation in the procedure from the performing physician.  

## 2015-10-16 NOTE — Progress Notes (Signed)
Report to PACU, RN, vss, BBS= Clear.  

## 2015-10-16 NOTE — Addendum Note (Signed)
Addended by: Larina Bras on: 10/16/2015 08:27 AM   Modules accepted: Orders

## 2015-10-16 NOTE — Op Note (Signed)
[facility]  [address] [place], [address]   COLONOSCOPY PROCEDURE REPORT  PATIENT: Joshua Parrish, Joshua Parrish  MR#: [id number] BIRTHDATE: May 19, 1976 , 39  yrs. old GENDER: male ENDOSCOPIST: Jerene Bears, MD PROCEDURE DATE:  10/16/2015 PROCEDURE:   Colonoscopy, diagnostic and Colonoscopy with snare polypectomy First Screening Colonoscopy - Avg.  risk and is 50 yrs.  old or older - No.  Prior Negative Screening - Now for repeat screening. N/A  History of Adenoma - Now for follow-up colonoscopy & has been > or = to 3 yrs.  N/A  Polyps removed today? Yes ASA CLASS:   Class II INDICATIONS:change in bowel habits and upper abdominal pain, weight loss. MEDICATIONS: Monitored anesthesia care and Propofol 350 mg IV  DESCRIPTION OF PROCEDURE:   After the risks benefits and alternatives of the procedure were thoroughly explained, informed consent was obtained.  The digital rectal exam revealed no rectal mass.   The LB [id number]  endoscope was introduced through the anus and advanced to the terminal ileum which was intubated for a short distance. No adverse events experienced.   The quality of the prep was good.  (Suprep was used)  The instrument was then slowly withdrawn as the colon was fully examined. Estimated blood loss is zero unless otherwise noted in this procedure report.      COLON FINDINGS: The examined terminal ileum appeared to be normal. Three sessile polyps ranging between 3-42mm in size were found in the transverse colon (1)and descending colon (2).  Polypectomies were performed with a cold snare.  The resection was complete, the polyp tissue was completely retrieved and sent to histology.   The examination was otherwise normal.  Retroflexed views revealed small internal hemorrhoids. The time to cecum = 2.3 Withdrawal time = 12.6   The scope was withdrawn and the procedure completed.  COMPLICATIONS: There were no immediate complications.  ENDOSCOPIC  IMPRESSION: 1.   The examined terminal ileum appeared to be normal 2.   Three sessile polyps ranging between 3-70mm in size were found in the transverse colon and descending colon; polypectomies were performed with a cold snare 3.   The examination was otherwise normal  RECOMMENDATIONS: 1.  Await pathology results 2.  Proceed with rifaximin 550 mg three times daily x 14 days.  if no improvement thereafter, I recommend surgical referral for consideration of cholecystectomy despite previously unremarkable gallbladder studies  eSigned:  Jerene Bears, MD 10/16/2015 3:25 PM   cc: the patient, Fredna Dow   PATIENT NAME:  Joshua Parrish, Joshua Parrish MR#: [id number]

## 2015-10-17 ENCOUNTER — Telehealth: Payer: Self-pay | Admitting: *Deleted

## 2015-10-17 NOTE — Telephone Encounter (Signed)
  Follow up Call-  Call back number 10/16/2015 08/21/2015  Post procedure Call Back phone  # [phone]  Permission to leave phone message Yes Yes     Patient questions:  Do you have a fever, pain , or abdominal swelling? No. Pain Score  0 *  Have you tolerated food without any problems? Yes.    Have you been able to return to your normal activities? Yes.    Do you have any questions about your discharge instructions: Diet   No. Medications  No. Follow up visit  No.  Do you have questions or concerns about your Care? No.  Actions: * If pain score is 4 or above: No action needed, pain <4.

## 2015-10-17 NOTE — Telephone Encounter (Signed)
error 

## 2015-10-18 ENCOUNTER — Telehealth: Payer: Self-pay | Admitting: Internal Medicine

## 2015-10-18 NOTE — Telephone Encounter (Signed)
Dr Pyrtle-insurance denied xifaxan. I have already requested samples of Xifaxan for another patient so I am unsure I will be able to get more samples anytime very soon just for the fact that the reps dont get a ton. Do you have any other suggestions?

## 2015-10-23 MED ORDER — AMOXICILLIN-POT CLAVULANATE 875-125 MG PO TABS: 1.0000 | ORAL_TABLET | Freq: Two times a day (BID) | ORAL | Status: DC

## 2015-10-23 NOTE — Telephone Encounter (Signed)
Patient returned phone call. °

## 2015-10-23 NOTE — Telephone Encounter (Signed)
Given rifaximin was not covered by insurance Prescribe Augmentin 875 twice a day 10 days Have him call with update after therapy

## 2015-10-23 NOTE — Telephone Encounter (Signed)
Left message for patient to call back  

## 2015-10-23 NOTE — Telephone Encounter (Signed)
Patient advised of Dr Vena Rua recommendations. He will get Augmentin and call us back after course to let us know how he is feeling.

## 2015-10-25 ENCOUNTER — Encounter: Payer: Self-pay | Admitting: Internal Medicine

## 2015-10-29 ENCOUNTER — Encounter: Payer: Self-pay | Admitting: Internal Medicine

## 2015-11-13 ENCOUNTER — Telehealth: Payer: Self-pay | Admitting: Internal Medicine

## 2015-11-13 NOTE — Telephone Encounter (Signed)
Pt states the antibiotics he took did not help. States he is now having problems with bad acid reflux and burning in his chest. Pt wants to know what he should do. States he can just eat a piece of bread and have heartburn.

## 2015-11-14 NOTE — Telephone Encounter (Signed)
He had stopped PPI previously and given reflux now would resume pantoprazole 40 mg twice daily before meals 2 weeks then reduce to once daily before breakfast Is he still having issues with the upper abdominal pain? If so would refer to general surgery for further discussion and consideration of possible cholecystectomy negative GI evaluation

## 2015-11-14 NOTE — Telephone Encounter (Signed)
Spoke with pt and he is aware. If pain continues pt will call back for referral to [facility].

## 2015-11-14 NOTE — Telephone Encounter (Signed)
Left message for pt to call back  °

## 2015-11-27 ENCOUNTER — Encounter: Payer: Self-pay | Admitting: Family

## 2015-11-27 ENCOUNTER — Ambulatory Visit (INDEPENDENT_AMBULATORY_CARE_PROVIDER_SITE_OTHER): Payer: Commercial Managed Care - HMO | Admitting: Family

## 2015-11-27 ENCOUNTER — Telehealth: Payer: Self-pay | Admitting: Internal Medicine

## 2015-11-27 VITALS — BP 152/98 | HR 94 | Temp 98.5°F | Resp 16 | Ht 68.0 in | Wt 259.0 lb

## 2015-11-27 DIAGNOSIS — R1011 Right upper quadrant pain: Secondary | ICD-10-CM | POA: Diagnosis not present

## 2015-11-27 DIAGNOSIS — R221 Localized swelling, mass and lump, neck: Secondary | ICD-10-CM | POA: Insufficient documentation

## 2015-11-27 MED ORDER — SUCRALFATE 1 G PO TABS: 1.0000 g | ORAL_TABLET | Freq: Three times a day (TID) | ORAL | Status: DC

## 2015-11-27 NOTE — Assessment & Plan Note (Signed)
Mass located left side of lower neck next to trachea with concern for thyroid or lymphadenopathy. Obtain ultrasound. Follow up pending ultrasound results for further evaluation and treatment if needed.

## 2015-11-27 NOTE — Telephone Encounter (Signed)
Okay to refer to [facility] for possible symptomatic gallbladder disease despite negative GI workup

## 2015-11-27 NOTE — Assessment & Plan Note (Signed)
Continues to experience RUQ pain with a negative work up to date. Continue current dosage of pantoprazole and start sucralfate in attempts to help with symptoms. Recommend follow up with gastroenterology with the recommend next step a general surgical consult for potential cholecystectomy. Follow up with primary care as needed.

## 2015-11-27 NOTE — Patient Instructions (Signed)
Thank you for choosing [facility].  Summary/Instructions:  Please continue to take the pantoprazole. Start carafate to see if it helps with your symptoms.  Follow up with Dr. Hilarie Fredrickson.  They will call to schedule your ultrasound of your neck.   Your prescription(s) have been submitted to your pharmacy or been printed and provided for you. Please take as directed and contact our office if you believe you are having problem(s) with the medication(s) or have any questions.  If your symptoms worsen or fail to improve, please contact our office for further instruction, or in case of emergency go directly to the emergency room at the closest medical facility.

## 2015-11-27 NOTE — Telephone Encounter (Signed)
Pt calling for referral to [facility] for surgery for possible gallbladder surgery.  Please advise.

## 2015-11-27 NOTE — Progress Notes (Signed)
Subjective:    Patient ID: Joshua Parrish, male    DOB: 1975-09-03, 40 y.o.   MRN: [id number]  Chief Complaint  Patient presents with  . Establish Care    has a sore throat and has stomach pain and also has pain in sides    HPI:  Joshua Parrish is a 40 y.o. male who  has a past medical history of Depression; Osteoarthritis of shoulder; Fatty liver; Chicken pox; GERD (gastroesophageal reflux disease); Rotator cuff impingement syndrome of left shoulder (06/19/2015); and Migraines. and presents today for an office visit to establish care.  1.) Sore throat. - Associated symptom of pain located in his throat located in the front of his throat has been going on for about 1 month. He has had previous ACDF surgery. Pain is described as fairly constant and aggravated by swallowing. Indicates that he can drink cold water feels like fire in his throat.   2.)  Stomach -  Associated symptom of pain located in his RUQ that has been going on for about 5 months and described as burning and is exacerbated with eating. Modifying factors include pantoprazole and Augmentin neither which have helped very much with his symptoms. He has been working with Gastroenterology with a workup that has included an abdominal ultrasound showing fatty liver, normal gastric emptying study; HIDA scan with CCK normal; CT scan of the abdomen and pelvis with contrast showing tiny non-obstructing calculus in the lower pole of the left kidney; normal CTA of the chest; and EGD with slightly irrecular Z line, retained food suggesting gastropareisis with a normal duodenum with biopsies being negative for H. Pylori and benign squamous glandular mucosa with chronic inflammation with no metaplasia. It was also suggested to try Xifaxin, however it was not covered by insurance. He has also had changes in his bowel movement patterns with loose stools sometimes described as "explosive". Describes that he also has anal leakage which he describes as yellow  and smells like emesis.   No Known Allergies   Outpatient Prescriptions Prior to Visit  Medication Sig Dispense Refill  . pantoprazole (PROTONIX) 40 MG tablet Reported on 10/16/2015  3  . Amino Acids (AMINO ACID PO) Take 1 scoop by mouth daily.    Marland Kitchen amoxicillin-clavulanate (AUGMENTIN) 875-125 MG tablet Take 1 tablet by mouth 2 (two) times daily. 20 tablet 0  . cyclobenzaprine (FLEXERIL) 10 MG tablet Take by mouth. Reported on 10/16/2015    . diazepam (VALIUM) 5 MG tablet Reported on 10/16/2015  0  . dicyclomine (BENTYL) 20 MG tablet Reported on 10/16/2015  0  . famotidine (PEPCID) 20 MG tablet Reported on 10/16/2015  3  . HYDROcodone-acetaminophen (NORCO/VICODIN) 5-325 MG tablet Reported on 10/16/2015    . meloxicam (MOBIC) 15 MG tablet Reported on 10/16/2015  3  . metoCLOPramide (REGLAN) 10 MG tablet Reported on 10/16/2015  0  . omeprazole (PRILOSEC) 20 MG capsule Reported on 10/16/2015  0  . ondansetron (ZOFRAN) 4 MG tablet Reported on 10/16/2015  0  . PENNSAID 2 % SOLN Reported on 10/16/2015  2  . rifaximin (XIFAXAN) 550 MG TABS tablet Take 1 tablet (550 mg total) by mouth 3 (three) times daily. (Patient not taking: Reported on 10/16/2015) 42 tablet 0   No facility-administered medications prior to visit.     Past Medical History  Diagnosis Date  . Depression   . Osteoarthritis of shoulder     left  . Fatty liver   . Chicken pox   .  GERD (gastroesophageal reflux disease)   . Rotator cuff impingement syndrome of left shoulder 06/19/2015  . Migraines      Past Surgical History  Procedure Laterality Date  . Clavicle excision    . Appendectomy    . Knee surgery    . Cervical laminectomy    . Hypospadias correction      as child  . Shoulder arthroscopy with rotator cuff repair Left 06/26/2015    Procedure: SHOULDER ARTHROSCOPY WITH ROTATOR CUFF REPAIR;  Surgeon: Elsie Saas, MD;  Location: [facility];  Service: Orthopedics;  Laterality: Left;     Family History    Problem Relation Age of Onset  . Cancer Other   . Hypertension Other   . Diabetes Other   . Colon cancer Neg Hx   . Arthritis Mother   . Cancer Mother   . Alcohol abuse Father   . Cancer Maternal Grandmother      Social History   Social History  . Marital Status: Married    Spouse Name: N/A  . Number of Children: 1  . Years of Education: 12   Occupational History  . Garage Interior and spatial designer     Social History Main Topics  . Smoking status: Never Smoker   . Smokeless tobacco: Current User    Types: Chew  . Alcohol Use: 0.0 oz/week    0 Standard drinks or equivalent per week     Comment: occasional  . Drug Use: No  . Sexual Activity: Not on file   Other Topics Concern  . Not on file   Social History Narrative   Fun: Lift weights, hike    Review of Systems  Constitutional: Negative for fever and chills.  Respiratory: Negative for cough, chest tightness and shortness of breath.   Cardiovascular: Negative for chest pain, palpitations and leg swelling.  Gastrointestinal: Positive for abdominal pain and diarrhea. Negative for nausea, vomiting and constipation.  Psychiatric/Behavioral: The patient is hyperactive.       Objective:    BP 152/98 mmHg  Pulse 94  Temp(Src) 98.5 F (36.9 C) (Oral)  Resp 16  Ht 5\' 8"  (1.727 m)  Wt 259 lb (117.482 kg)  BMI 39.39 kg/m2  SpO2 98% Nursing note and vital signs reviewed.  Physical Exam  Constitutional: He is oriented to person, place, and time. He appears well-developed and well-nourished. No distress.  Neck:  Enlarged mass located to the left of his trachea around his thyroid that is mildly tender. Old well healed scars from previous surgery noted.   Cardiovascular: Normal rate, regular rhythm, normal heart sounds and intact distal pulses.   Pulmonary/Chest: Effort normal and breath sounds normal.  Abdominal: He exhibits no distension and no mass. There is tenderness. There is no rebound and no guarding.  Neurological:  He is alert and oriented to person, place, and time.  Skin: Skin is warm and dry.  Psychiatric: He has a normal mood and affect. His behavior is normal. Judgment and thought content normal.       Assessment & Plan:   Problem List Items Addressed This Visit      Other   Mass in neck - Primary    Mass located left side of lower neck next to trachea with concern for thyroid or lymphadenopathy. Obtain ultrasound. Follow up pending ultrasound results for further evaluation and treatment if needed.       Relevant Orders   US Soft Tissue Head/Neck   RUQ abdominal pain  Continues to experience RUQ pain with a negative work up to date. Continue current dosage of pantoprazole and start sucralfate in attempts to help with symptoms. Recommend follow up with gastroenterology with the recommend next step a general surgical consult for potential cholecystectomy. Follow up with primary care as needed.       Relevant Medications   sucralfate (CARAFATE) 1 g tablet

## 2015-11-27 NOTE — Progress Notes (Signed)
Pre visit review using our clinic review tool, if applicable. No additional management support is needed unless otherwise documented below in the visit note. 

## 2015-11-28 ENCOUNTER — Other Ambulatory Visit: Payer: Self-pay | Admitting: Family

## 2015-11-28 DIAGNOSIS — R221 Localized swelling, mass and lump, neck: Secondary | ICD-10-CM

## 2015-11-28 MED ORDER — HYDROCORTISONE ACETATE 25 MG RE SUPP: 25.0000 mg | Freq: Two times a day (BID) | RECTAL | Status: DC

## 2015-11-28 NOTE — Telephone Encounter (Signed)
Pt scheduled to see Dr. Redmond Pulling with [facility] 12/04/15, pt to arrive there at 2:10pm. Pt wanted to also let Dr. Hilarie Fredrickson know that when he goes to the bathroom he sees bright red blood on the tissue and at times he can be walking and feels it leak out. Pt does have history of internal hemorrhoids. Dr. Hilarie Fredrickson notified.

## 2015-11-28 NOTE — Telephone Encounter (Signed)
Patient can be scheduled for hemorrhoidal banding if interested for bleeding internal hemorrhoids Also could try Anusol HC suppository 25mg   twice a day 5-7 days

## 2015-11-28 NOTE — Telephone Encounter (Signed)
Pt aware and script sent to pharmacy. Pt will call back if he wants to schedule banding.

## 2015-11-29 ENCOUNTER — Other Ambulatory Visit (INDEPENDENT_AMBULATORY_CARE_PROVIDER_SITE_OTHER): Payer: Commercial Managed Care - HMO

## 2015-11-29 ENCOUNTER — Ambulatory Visit (INDEPENDENT_AMBULATORY_CARE_PROVIDER_SITE_OTHER): Payer: Commercial Managed Care - HMO | Admitting: Gastroenterology

## 2015-11-29 ENCOUNTER — Telehealth: Payer: Self-pay | Admitting: Internal Medicine

## 2015-11-29 ENCOUNTER — Encounter: Payer: Self-pay | Admitting: Gastroenterology

## 2015-11-29 VITALS — BP 130/92 | HR 84 | Temp 98.3°F | Ht 68.0 in | Wt 256.8 lb

## 2015-11-29 DIAGNOSIS — R110 Nausea: Secondary | ICD-10-CM

## 2015-11-29 DIAGNOSIS — R197 Diarrhea, unspecified: Secondary | ICD-10-CM | POA: Insufficient documentation

## 2015-11-29 DIAGNOSIS — K6289 Other specified diseases of anus and rectum: Secondary | ICD-10-CM

## 2015-11-29 DIAGNOSIS — R109 Unspecified abdominal pain: Secondary | ICD-10-CM | POA: Diagnosis not present

## 2015-11-29 LAB — URINALYSIS
BILIRUBIN URINE: NEGATIVE
Ketones, ur: NEGATIVE
Leukocytes, UA: NEGATIVE
Nitrite: NEGATIVE
Specific Gravity, Urine: 1.015 (ref 1.000–1.030)
TOTAL PROTEIN, URINE-UPE24: NEGATIVE
URINE GLUCOSE: NEGATIVE
Urobilinogen, UA: 0.2 (ref 0.0–1.0)
pH: 6 (ref 5.0–8.0)

## 2015-11-29 LAB — CBC WITH DIFFERENTIAL/PLATELET
BASOS ABS: 0 10*3/uL (ref 0.0–0.1)
Basophils Relative: 0.3 % (ref 0.0–3.0)
EOS PCT: 0.9 % (ref 0.0–5.0)
Eosinophils Absolute: 0.1 10*3/uL (ref 0.0–0.7)
HCT: 41.5 % (ref 39.0–52.0)
Hemoglobin: 14.4 g/dL (ref 13.0–17.0)
LYMPHS ABS: 2.7 10*3/uL (ref 0.7–4.0)
Lymphocytes Relative: 33.5 % (ref 12.0–46.0)
MCHC: 34.6 g/dL (ref 30.0–36.0)
MCV: 86.6 fl (ref 78.0–100.0)
MONO ABS: 0.5 10*3/uL (ref 0.1–1.0)
MONOS PCT: 5.9 % (ref 3.0–12.0)
NEUTROS ABS: 4.7 10*3/uL (ref 1.4–7.7)
NEUTROS PCT: 59.4 % (ref 43.0–77.0)
PLATELETS: 316 10*3/uL (ref 150.0–400.0)
RBC: 4.79 Mil/uL (ref 4.22–5.81)
RDW: 12.9 % (ref 11.5–15.5)
WBC: 7.9 10*3/uL (ref 4.0–10.5)

## 2015-11-29 LAB — HEPATIC FUNCTION PANEL
ALBUMIN: 4.7 g/dL (ref 3.5–5.2)
ALT: 35 U/L (ref 0–53)
AST: 21 U/L (ref 0–37)
Alkaline Phosphatase: 58 U/L (ref 39–117)
Bilirubin, Direct: 0.1 mg/dL (ref 0.0–0.3)
TOTAL PROTEIN: 7.4 g/dL (ref 6.0–8.3)
Total Bilirubin: 0.5 mg/dL (ref 0.2–1.2)

## 2015-11-29 LAB — BASIC METABOLIC PANEL
BUN: 12 mg/dL (ref 6–23)
CO2: 28 mEq/L (ref 19–32)
Calcium: 9.7 mg/dL (ref 8.4–10.5)
Chloride: 101 mEq/L (ref 96–112)
Creatinine, Ser: 0.97 mg/dL (ref 0.40–1.50)
GFR: 90.84 mL/min (ref >60.00)
GLUCOSE: 85 mg/dL (ref 70–99)
Potassium: 4 mEq/L (ref 3.5–5.1)
SODIUM: 136 meq/L (ref 135–145)

## 2015-11-29 MED ORDER — DILTIAZEM GEL 2 %: 1.0000 "application " | Freq: Three times a day (TID) | CUTANEOUS | Status: DC

## 2015-11-29 NOTE — Patient Instructions (Addendum)
You have been given a separate informational sheet regarding your tobacco use, the importance of quitting and local resources to help you quit.  Your physician has requested that you go to the basement for lab work before leaving today.  Please discontinue hydrocortisone suppositories.  We have sent a prescription for diltiazem gel to [facility]. You should apply a pea size amount to your rectum three times daily x 6-8 weeks.  [facility]'s information is below: Address: [address], [place], [place] [address]  [phone]  Start taking your dicyclomine twice daily again.  If you are age 41 or older, your body mass index should be between 23-30. Your Body mass index is 39.06 kg/(m^2). If this is out of the aforementioned range listed, please consider follow up with your Primary Care Provider.  If you are age 28 or younger, your body mass index should be between 19-25. Your Body mass index is 39.06 kg/(m^2). If this is out of the aformentioned range listed, please consider follow up with your Primary Care Provider.   Today your blood pressure was elevated. Please follow up with your Primary Care Provider for blood pressure management.

## 2015-11-29 NOTE — Telephone Encounter (Signed)
Caller name: Message on hours log   Call back number: [phone]   Reason for call:  Pt left message stating that rectum has stopped bleeding, but now having dark liquid coming out of bottom that stinks of vomit.

## 2015-11-29 NOTE — Telephone Encounter (Signed)
APP appt already scheduled Will need to determine if bleeding was hemorrhoidal or further eval necessary .Marland Kitchen VCE, meckel's scan???

## 2015-11-29 NOTE — Telephone Encounter (Signed)
Pt scheduled for appt today at 1:30pm with Alonza Bogus PA.

## 2015-11-29 NOTE — Progress Notes (Addendum)
11/29/2015 Joshua Parrish [id number] 08/31/1976   History of Present Illness:  This is a 40 year old male who is known to Dr. Elmo Putt for complaints of ongoing abdominal pains and loose stools. Most recently he underwent a colonoscopy on 10/16/2015 at which time he was found to have 3 polyps which were removed and showed fragments of tubular adenomas.  He was treated with a course of Xifaxan and since that has failed to improve his symptoms he is being referred to surgeons to consider cholecystectomy. He has an appointment on April 4. He has had extensive other GI evaluation recently as well including 2 CT scans, EGD, abdominal ultrasound, HIDA scan, gastric emptying scan.  Celiac labs were negative.  He was put on for an appointment today as an urgent add-on for complaints of rectal bleeding and a clear/yellowish drainage that has a foul odor coming from his rectum. He says that this just began yesterday and started with some red blood. He has not seen any further blood, just the yellow drainage. He also complains of anal discomfort when wiping and using the suppositories. Was given hydrocortisone suppositories to use, but obviously we haven't had time to see if they made any difference.  While he is here he continues to complain of abdominal pain saying that he still has the same pains that he complained about previously but also has more diffuse pains and tells me that "even my kidneys hurt". He tells me that he feels feverish, but has not had a fever at home. Complains of nausea. Tells me that he's been drinking a gallon of water but only has been urinating twice a day. All of these symptoms just began yesterday. He tells me "I feel like my body is shutting down ". He says that he told his mother "I feel like there is a gremlin living inside me and nobody has found it".  He also reports that yesterday he tried to eat some crackers and he felt like he was going to have a heart attack.   Current  Medications, Allergies, Past Medical History, Past Surgical History, Family History and Social History were reviewed in [employer] record.   Physical Exam: BP 160/100 mmHg  Pulse 84  Ht 5\' 8"  (1.727 m)  Wt 256 lb 12.8 oz (116.484 kg)  BMI 39.06 kg/m2 General: Well developed white male in no acute distress Head: Normocephalic and atraumatic Eyes:  Sclerae anicteric, conjunctiva pink  Ears: Normal auditory acuity Lungs: Clear throughout to auscultation Heart: Regular rate and rhythm Abdomen: Soft, non-distended.  Normal bowel sounds.  Diffuse TTP. Rectal:  No external hemorrhoids noted.  DRE was painful and not completely thorough due to discomfort.  Tender posteriorly ? Fissure. Musculoskeletal: Symmetrical with no gross deformities  Extremities: No edema  Neurological: Alert oriented x 4, grossly non-focal Psychological:  Alert and cooperative. Normal mood and affect  Assessment and Recommendations: -Rectal bleeding and pain:  Will treat empirically for anal fissure.  We will discontinue the use of hydrocortisone suppositories and will begin using diltiazem gel 3 times daily. -Diarrhea:  Worsened since his colonoscopy. Now having 6 or 7 bowel movements a day, which are soft and mushy. We'll check stool culture and Cdiff PCR. -Abdominal pain:  Still has the same abdominal pains that he's been complaining about for several months, but also additional nonspecific, diffuse pains as well. We will check a CBC, BMP, hepatic function panel, and urinalysis. He has Bentyl at home, which he will  begin taking twice daily. He has an appointment in 4 days to see the surgeons for evaluation of possible cholecystectomy.  I am hesitant to order any further abdominal imaging as he's already undergone 2 CT scans since 07/2015.  **I think that a lot of symptoms are functional and that he has health related anxiety.  Addendum: Reviewed and agree with ongoing management. Upper  symptoms difficult to figure out. Surgical referral for potential cholecystectomy though I have previously discussed with him that this may not provide clinical benefit. Workup to this point negative. Functional pain or IBS remains in differential though not classic presentation. Jerene Bears, MD

## 2015-11-29 NOTE — Telephone Encounter (Signed)
Pt called the after hours number last night. States the rectal bleeding stopped last night and then his stomach started hurting worse. Reports he then started having yellow drainage from his rectum that smells like vomit. States he was having chest pains like he did before. States he is still having a little of the yellow drainage. Please advise.

## 2015-11-30 ENCOUNTER — Ambulatory Visit
Admission: RE | Admit: 2015-11-30 | Discharge: 2015-11-30 | Disposition: A | Discharge status: Home / Self Care | Payer: Commercial Managed Care - HMO | Source: Ambulatory Visit | Attending: Family | Admitting: Family

## 2015-11-30 DIAGNOSIS — R221 Localized swelling, mass and lump, neck: Secondary | ICD-10-CM

## 2015-12-02 ENCOUNTER — Telehealth: Payer: Self-pay | Admitting: Family

## 2015-12-02 NOTE — Telephone Encounter (Signed)
Please inform patient that his thyroid ultrasound is normal with no significant findings. Therefore no further evaluation or follow-up as needed at this time. We will continue to follow up periodically as needed.

## 2015-12-03 NOTE — Telephone Encounter (Signed)
LVM letting pt know.  

## 2015-12-13 ENCOUNTER — Encounter (HOSPITAL_COMMUNITY): Payer: Self-pay | Admitting: *Deleted

## 2015-12-18 ENCOUNTER — Ambulatory Visit: Payer: Self-pay | Admitting: General Surgery

## 2015-12-18 NOTE — H&P (Signed)
Joshua Parrish 12/04/2015 2:11 PM Location: [facility] Patient #: [id number] DOB: [birth year] Married / Language: English / Race: White Male   History of Present Illness [name] M. Traeh Milroy MD; 12/04/2015 2:41 PM) Patient words: Eval. GB.  The patient is a 40 year old male who presents with abdominal pain. He is referred by Dr Hilarie Fredrickson for evaluation of epigastric and right upper abdominal pain. He reports a several month history of epigastric and right upper quadrant pain that will last 1-2 hours. He has had quite an exhaustive workup. The pain generally occurs at night as well as immediately after eating within 10-20 minutes. It last 1-2 hours. It occasionally radiates to his back. It often is associated with nausea but no emesis. He denies any fevers or chills. There is some occasional bloating. He had cervical spine surgery about a year ago and lost 100 pounds because initially bc some difficulty swallowing. However he denies any ongoing significant swallowing issues. Mylanta will help decrease his pain. A trial of reflux medication had no improvement. He also went over trial of rifampin with no change. Abdominal ultrasound just showed fatty liver. He had an upper endoscopy which showed some mild gastro-esophageal reflux disease and some retained food contents. A follow-up gastric emptying study was normal. A nuclear medicine scan with CCK was normal. He states he did have some reproduction of his symptoms with CCK injection. The CT scan was without significant findings other than a nonobstructing left kidney stone. Blood work has also been normal. Celiac panel was negative.   Problem List/Past Medical [name] [name], MD; 12/04/2015 2:42 PM) RIGHT UPPER QUADRANT PAIN (R10.11)  Other Problems [name], MD; 12/04/2015 2:42 PM) Arthritis Gastroesophageal Reflux Disease  Past Surgical History Illene Regulus, CMA; 12/04/2015 2:11 PM) Appendectomy Colon Polyp Removal -  Colonoscopy Knee Surgery Bilateral. Shoulder Surgery Bilateral. Spinal Surgery - Neck  Diagnostic Studies History [name], CMA; 12/04/2015 2:11 PM) Colonoscopy within last year  Allergies [name], CMA; 12/04/2015 2:12 PM) No Known Drug Allergies04/12/2015  Medication History ([name], CMA; 12/04/2015 2:13 PM) Dicyclomine HCl (20MG  Tablet, Oral) Active. Pantoprazole Sodium (40MG  Tablet DR, Oral) Active. Mylanta (200-200-20MG /5ML Suspension, Oral) Active. Medications Reconciled  Social History Illene Regulus, CMA; 12/04/2015 2:11 PM) Alcohol use Occasional alcohol use. Caffeine use Carbonated beverages, Coffee. No drug use Tobacco use Never smoker.  Family History Illene Regulus, [place]; 12/04/2015 2:11 PM) Alcohol Abuse Family Members In General, Father. Anesthetic complications Family Members In General, Mother. Breast Cancer Family Members In General. Colon Polyps Family Members In General, Mother. Hypertension Family Members In General. Migraine Headache Mother. Ovarian Cancer Mother.    Review of Systems [name] CMA; 12/04/2015 2:11 PM) General Present- Weight Gain. Not Present- Appetite Loss, Chills, Fatigue, Fever, Night Sweats and Weight Loss. Skin Present- Dryness. Not Present- Change in Wart/Mole, Hives, Jaundice, New Lesions, Non-Healing Wounds, Rash and Ulcer. HEENT Present- Seasonal Allergies, Sinus Pain and Sore Throat. Not Present- Earache, Hearing Loss, Hoarseness, Nose Bleed, Oral Ulcers, Ringing in the Ears, Visual Disturbances, Wears glasses/contact lenses and Yellow Eyes. Respiratory Present- Snoring. Not Present- Bloody sputum, Chronic Cough, Difficulty Breathing and Wheezing. Breast Not Present- Breast Mass, Breast Pain, Nipple Discharge and Skin Changes. Gastrointestinal Present- Abdominal Pain, Bloating, Change in Bowel Habits, Gets full quickly at meals, Hemorrhoids, Indigestion and Nausea. Not Present- Bloody  Stool, Chronic diarrhea, Constipation, Difficulty Swallowing, Excessive gas, Rectal Pain and Vomiting. Male Genitourinary Not Present- Blood in Urine, Change in Urinary Stream, Frequency, Impotence, Nocturia, Painful  Urination, Urgency and Urine Leakage. Musculoskeletal Present- Back Pain and Joint Stiffness. Not Present- Joint Pain, Muscle Pain, Muscle Weakness and Swelling of Extremities. Neurological Present- Headaches. Not Present- Decreased Memory, Fainting, Numbness, Seizures, Tingling, Tremor, Trouble walking and Weakness. Psychiatric Present- Anxiety and Depression. Not Present- Bipolar, Change in Sleep Pattern, Fearful and Frequent crying. Endocrine Not Present- Cold Intolerance, Excessive Hunger, Hair Changes, Heat Intolerance, Hot flashes and New Diabetes. Hematology Not Present- Easy Bruising, Excessive bleeding, Gland problems, HIV and Persistent Infections.  Vitals ([name] CMA; 12/04/2015 2:12 PM) 12/04/2015 2:12 PM Weight: 258 lb Height: 69in Body Surface Area: 2.3 m Body Mass Index: 38.1 kg/m  Pulse: 90 (Regular)  BP: 124/82 (Sitting, Left Arm, Standard)       Physical Exam [name] M. Clara Herbison MD; 12/04/2015 2:37 PM) General Mental Status-Alert. General Appearance-Consistent with stated age. Hydration-Well hydrated. Voice-Normal. Note: obese   Head and Neck Head-normocephalic, atraumatic with no lesions or palpable masses. Trachea-midline. Thyroid Gland Characteristics - normal size and consistency.  Eye Eyeball - Bilateral-Extraocular movements intact. Sclera/Conjunctiva - Bilateral-No scleral icterus.  Chest and Lung Exam Chest and lung exam reveals -quiet, even and easy respiratory effort with no use of accessory muscles and on auscultation, normal breath sounds, no adventitious sounds and normal vocal resonance. Inspection Chest Wall - Normal. Back - normal.  Breast - Did not examine.  Cardiovascular Cardiovascular  examination reveals -normal heart sounds, regular rate and rhythm with no murmurs and normal pedal pulses bilaterally.  Abdomen Inspection Inspection of the abdomen reveals - No Hernias. Skin - Scar - no surgical scars. Palpation/Percussion Palpation and Percussion of the abdomen reveal - Soft, No Rebound tenderness, No Rigidity (guarding) and No hepatosplenomegaly. Note: mild TTP in RUQ; no rt/guarding/peritonitis. Auscultation Auscultation of the abdomen reveals - Bowel sounds normal.  Peripheral Vascular Upper Extremity Palpation - Pulses bilaterally normal.  Neurologic Neurologic evaluation reveals -alert and oriented x 3 with no impairment of recent or remote memory. Mental Status-Normal.  Neuropsychiatric The patient's mood and affect are described as -normal. Judgment and Insight-insight is appropriate concerning matters relevant to self.  Musculoskeletal Normal Exam - Left-Upper Extremity Strength Normal and Lower Extremity Strength Normal. Normal Exam - Right-Upper Extremity Strength Normal and Lower Extremity Strength Normal.  Lymphatic Head & Neck  General Head & Neck Lymphatics: Bilateral - Description - Normal. Axillary - Did not examine. Femoral & Inguinal - Did not examine.    Assessment & Plan [name] M. Lizzeth Meder MD; 12/04/2015 2:43 PM) RIGHT UPPER QUADRANT PAIN (R10.11) Impression: I believe some if not most of the patient's symptoms are consistent with gallbladder disease. He has had quite an exhaustive workup. He has also had 100 pound weight loss which can induce gallbladder problems as well. Since the rest of his imaging studies ruled out any other significant pathology I believe a cholecystectomy is a reasonable next step  We discussed gallbladder disease. The patient was given educational material.  I discussed laparoscopic cholecystectomy with possible IOC in detail. The patient was given educational material as well as diagrams detailing the  procedure. We discussed the risks and benefits of a laparoscopic cholecystectomy including, but not limited to bleeding, infection, injury to surrounding structures such as the intestine or liver, bile leak, retained gallstones, need to convert to an open procedure, prolonged diarrhea, blood clots such as DVT, common bile duct injury, anesthesia risks, and possible need for additional procedures. We discussed the typical post-operative recovery course. I explained that the likelihood of improvement of their symptoms  is fair to good.  The patient has elected to proceed with surgery. Current Plans You are being scheduled for surgery - Our schedulers will call you.  You should hear from our office's scheduling department within 5 working days about the location, date, and time of surgery. We try to make accommodations for patient's preferences in scheduling surgery, but sometimes the OR schedule or the surgeon's schedule prevents [place] from making those accommodations.  If you have not heard from our office [phone]) in 5 working days, call the office and ask for your surgeon's nurse.  If you have other questions about your diagnosis, plan, or surgery, call the office and ask for your surgeon's nurse.  Pt Education - Pamphlet Given - Laparoscopic Gallbladder Surgery: discussed with patient and provided information.  Leighton Ruff. Redmond Pulling, MD, FACS General, Bariatric, & Minimally Invasive Surgery [facility], [place]

## 2015-12-24 ENCOUNTER — Ambulatory Visit (HOSPITAL_COMMUNITY): Payer: Commercial Managed Care - HMO | Admitting: Anesthesiology

## 2015-12-24 ENCOUNTER — Encounter (HOSPITAL_COMMUNITY)
Admission: RE | Disposition: A | Discharge status: Home / Self Care | Payer: Self-pay | Source: Ambulatory Visit | Attending: General Surgery

## 2015-12-24 ENCOUNTER — Encounter (HOSPITAL_COMMUNITY): Payer: Self-pay

## 2015-12-24 ENCOUNTER — Ambulatory Visit (HOSPITAL_COMMUNITY)
Admission: RE | Admit: 2015-12-24 | Discharge: 2015-12-24 | Disposition: A | Discharge status: Home / Self Care | Payer: Commercial Managed Care - HMO | Source: Ambulatory Visit | Attending: General Surgery | Admitting: General Surgery

## 2015-12-24 DIAGNOSIS — K8010 Calculus of gallbladder with chronic cholecystitis without obstruction: Secondary | ICD-10-CM | POA: Diagnosis not present

## 2015-12-24 DIAGNOSIS — M1990 Unspecified osteoarthritis, unspecified site: Secondary | ICD-10-CM | POA: Insufficient documentation

## 2015-12-24 DIAGNOSIS — R109 Unspecified abdominal pain: Secondary | ICD-10-CM | POA: Diagnosis present

## 2015-12-24 DIAGNOSIS — Z79899 Other long term (current) drug therapy: Secondary | ICD-10-CM | POA: Insufficient documentation

## 2015-12-24 DIAGNOSIS — K219 Gastro-esophageal reflux disease without esophagitis: Secondary | ICD-10-CM | POA: Diagnosis not present

## 2015-12-24 HISTORY — DX: Chronic sinusitis, unspecified: J32.9

## 2015-12-24 HISTORY — PX: CHOLECYSTECTOMY: SHX55

## 2015-12-24 SURGERY — LAPAROSCOPIC CHOLECYSTECTOMY WITH INTRAOPERATIVE CHOLANGIOGRAM
Anesthesia: General | Site: Abdomen

## 2015-12-24 MED ORDER — FENTANYL CITRATE (PF) 250 MCG/5ML IJ SOLN
INTRAMUSCULAR | Status: AC
  Filled 2015-12-24: qty 5

## 2015-12-24 MED ORDER — LABETALOL HCL 5 MG/ML IV SOLN
INTRAVENOUS | Status: AC
  Filled 2015-12-24: qty 4

## 2015-12-24 MED ORDER — ACETAMINOPHEN 325 MG PO TABS: 650.0000 mg | ORAL_TABLET | ORAL | Status: DC | PRN

## 2015-12-24 MED ORDER — BUPIVACAINE-EPINEPHRINE 0.25% -1:200000 IJ SOLN
INTRAMUSCULAR | Status: AC
  Filled 2015-12-24: qty 1

## 2015-12-24 MED ORDER — MIDAZOLAM HCL 5 MG/5ML IJ SOLN
INTRAMUSCULAR | Status: DC | PRN
  Administered 2015-12-24 (×2): 2 mg via INTRAVENOUS

## 2015-12-24 MED ORDER — DEXAMETHASONE SODIUM PHOSPHATE 10 MG/ML IJ SOLN
INTRAMUSCULAR | Status: AC
  Filled 2015-12-24: qty 1

## 2015-12-24 MED ORDER — CEFOTETAN DISODIUM-DEXTROSE 2-2.08 GM-% IV SOLR
2.0000 g | INTRAVENOUS | Status: AC
  Administered 2015-12-24: 2 g via INTRAVENOUS

## 2015-12-24 MED ORDER — ACETAMINOPHEN 650 MG RE SUPP
650.0000 mg | RECTAL | Status: DC | PRN
  Filled 2015-12-24: qty 1

## 2015-12-24 MED ORDER — ROCURONIUM BROMIDE 100 MG/10ML IV SOLN
INTRAVENOUS | Status: AC
Start: 2015-12-24 — End: 2015-12-24
  Filled 2015-12-24: qty 1

## 2015-12-24 MED ORDER — PROPOFOL 10 MG/ML IV BOLUS
INTRAVENOUS | Status: DC | PRN
  Administered 2015-12-24: 200 mg via INTRAVENOUS
  Administered 2015-12-24 (×2): 50 mg via INTRAVENOUS

## 2015-12-24 MED ORDER — ROCURONIUM BROMIDE 100 MG/10ML IV SOLN
INTRAVENOUS | Status: AC
  Filled 2015-12-24: qty 1

## 2015-12-24 MED ORDER — ONDANSETRON HCL 4 MG/2ML IJ SOLN: 4.0000 mg | Freq: Once | INTRAMUSCULAR | Status: DC | PRN

## 2015-12-24 MED ORDER — PROPOFOL 10 MG/ML IV BOLUS
INTRAVENOUS | Status: AC
  Filled 2015-12-24: qty 20

## 2015-12-24 MED ORDER — IOPAMIDOL (ISOVUE-300) INJECTION 61%
INTRAVENOUS | Status: AC
  Filled 2015-12-24: qty 50

## 2015-12-24 MED ORDER — LABETALOL HCL 5 MG/ML IV SOLN
5.0000 mg | INTRAVENOUS | Status: DC | PRN
  Administered 2015-12-24: 5 mg via INTRAVENOUS
  Filled 2015-12-24 (×2): qty 4

## 2015-12-24 MED ORDER — HYDROMORPHONE HCL 1 MG/ML IJ SOLN
INTRAMUSCULAR | Status: AC
  Filled 2015-12-24: qty 1

## 2015-12-24 MED ORDER — CEFOTETAN DISODIUM-DEXTROSE 2-2.08 GM-% IV SOLR
INTRAVENOUS | Status: AC
  Filled 2015-12-24: qty 50

## 2015-12-24 MED ORDER — MEPERIDINE HCL 50 MG/ML IJ SOLN: 6.2500 mg | INTRAMUSCULAR | Status: DC | PRN

## 2015-12-24 MED ORDER — SODIUM CHLORIDE 0.9% FLUSH: 3.0000 mL | Freq: Two times a day (BID) | INTRAVENOUS | Status: DC

## 2015-12-24 MED ORDER — OXYCODONE HCL 5 MG PO TABS: 5.0000 mg | ORAL_TABLET | Freq: Four times a day (QID) | ORAL | Status: DC | PRN

## 2015-12-24 MED ORDER — LACTATED RINGERS IV SOLN
INTRAVENOUS | Status: DC | PRN
  Administered 2015-12-24 (×2): via INTRAVENOUS

## 2015-12-24 MED ORDER — ACETAMINOPHEN 10 MG/ML IV SOLN
1000.0000 mg | Freq: Once | INTRAVENOUS | Status: AC
  Administered 2015-12-24: 1000 mg via INTRAVENOUS

## 2015-12-24 MED ORDER — ONDANSETRON HCL 4 MG/2ML IJ SOLN
INTRAMUSCULAR | Status: AC
  Filled 2015-12-24: qty 2

## 2015-12-24 MED ORDER — LACTATED RINGERS IV SOLN
INTRAVENOUS | Status: DC
  Administered 2015-12-24: 1000 mL via INTRAVENOUS

## 2015-12-24 MED ORDER — LABETALOL HCL 5 MG/ML IV SOLN
INTRAVENOUS | Status: DC | PRN
  Administered 2015-12-24: 5 mg via INTRAVENOUS
  Administered 2015-12-24 (×2): 2.5 mg via INTRAVENOUS
  Administered 2015-12-24: 5 mg via INTRAVENOUS

## 2015-12-24 MED ORDER — ROCURONIUM BROMIDE 100 MG/10ML IV SOLN
INTRAVENOUS | Status: DC | PRN
  Administered 2015-12-24: 50 mg via INTRAVENOUS
  Administered 2015-12-24: 10 mg via INTRAVENOUS

## 2015-12-24 MED ORDER — HYDROMORPHONE HCL 1 MG/ML IJ SOLN
0.2500 mg | INTRAMUSCULAR | Status: DC | PRN
  Administered 2015-12-24 (×4): 0.5 mg via INTRAVENOUS

## 2015-12-24 MED ORDER — LIDOCAINE HCL (CARDIAC) 20 MG/ML IV SOLN
INTRAVENOUS | Status: DC | PRN
  Administered 2015-12-24: 100 mg via INTRATRACHEAL

## 2015-12-24 MED ORDER — SODIUM CHLORIDE 0.9 % IV SOLN: 250.0000 mL | INTRAVENOUS | Status: DC | PRN

## 2015-12-24 MED ORDER — FENTANYL CITRATE (PF) 250 MCG/5ML IJ SOLN
INTRAMUSCULAR | Status: DC | PRN
  Administered 2015-12-24: 100 ug via INTRAVENOUS
  Administered 2015-12-24 (×5): 50 ug via INTRAVENOUS
  Administered 2015-12-24: 100 ug via INTRAVENOUS
  Administered 2015-12-24: 50 ug via INTRAVENOUS

## 2015-12-24 MED ORDER — ONDANSETRON HCL 4 MG/2ML IJ SOLN
INTRAMUSCULAR | Status: DC | PRN
  Administered 2015-12-24: 4 mg via INTRAVENOUS

## 2015-12-24 MED ORDER — MIDAZOLAM HCL 2 MG/2ML IJ SOLN
INTRAMUSCULAR | Status: AC
  Filled 2015-12-24: qty 2

## 2015-12-24 MED ORDER — KETOROLAC TROMETHAMINE 30 MG/ML IJ SOLN
INTRAMUSCULAR | Status: DC | PRN
  Administered 2015-12-24: 30 mg via INTRAVENOUS

## 2015-12-24 MED ORDER — SUGAMMADEX SODIUM 200 MG/2ML IV SOLN
INTRAVENOUS | Status: AC
  Filled 2015-12-24: qty 2

## 2015-12-24 MED ORDER — LIDOCAINE HCL (CARDIAC) 20 MG/ML IV SOLN
INTRAVENOUS | Status: AC
  Filled 2015-12-24: qty 5

## 2015-12-24 MED ORDER — SODIUM CHLORIDE 0.9% FLUSH: 3.0000 mL | INTRAVENOUS | Status: DC | PRN

## 2015-12-24 MED ORDER — ACETAMINOPHEN 10 MG/ML IV SOLN
INTRAVENOUS | Status: AC
  Filled 2015-12-24: qty 100

## 2015-12-24 MED ORDER — MORPHINE SULFATE (PF) 10 MG/ML IV SOLN: 1.0000 mg | INTRAVENOUS | Status: DC | PRN

## 2015-12-24 MED ORDER — BUPIVACAINE-EPINEPHRINE 0.25% -1:200000 IJ SOLN
INTRAMUSCULAR | Status: DC | PRN
  Administered 2015-12-24: 45 mL

## 2015-12-24 MED ORDER — OXYCODONE HCL 5 MG PO TABS
5.0000 mg | ORAL_TABLET | ORAL | Status: DC | PRN
  Administered 2015-12-24: 5 mg via ORAL
  Filled 2015-12-24: qty 1

## 2015-12-24 MED ORDER — LACTATED RINGERS IR SOLN
Status: DC | PRN
  Administered 2015-12-24: 1000 mL

## 2015-12-24 MED ORDER — SUGAMMADEX SODIUM 200 MG/2ML IV SOLN
INTRAVENOUS | Status: DC | PRN
  Administered 2015-12-24: 200 mg via INTRAVENOUS

## 2015-12-24 MED ORDER — DEXAMETHASONE SODIUM PHOSPHATE 10 MG/ML IJ SOLN
INTRAMUSCULAR | Status: DC | PRN
  Administered 2015-12-24: 10 mg via INTRAVENOUS

## 2015-12-24 SURGICAL SUPPLY — 48 items
ADH SKN CLS APL DERMABOND .7 (GAUZE/BANDAGES/DRESSINGS) ×1
APL SRG 38 LTWT LNG FL B (MISCELLANEOUS)
APPLICATOR ARISTA FLEXITIP XL (MISCELLANEOUS) IMPLANT
APPLIER CLIP 5 13 M/L LIGAMAX5 (MISCELLANEOUS) ×2
APPLIER CLIP ROT 10 11.4 M/L (STAPLE)
APR CLP MED LRG 11.4X10 (STAPLE)
APR CLP MED LRG 5 ANG JAW (MISCELLANEOUS) ×1
BAG SPEC RTRVL 10 TROC 200 (ENDOMECHANICALS) ×1
CABLE HIGH FREQUENCY MONO STRZ (ELECTRODE) ×2 IMPLANT
CHLORAPREP W/TINT 26ML (MISCELLANEOUS) ×2 IMPLANT
CLIP APPLIE 5 13 M/L LIGAMAX5 (MISCELLANEOUS) ×1 IMPLANT
CLIP APPLIE ROT 10 11.4 M/L (STAPLE) IMPLANT
COVER MAYO STAND STRL (DRAPES) IMPLANT
COVER SURGICAL LIGHT HANDLE (MISCELLANEOUS) ×2 IMPLANT
DECANTER SPIKE VIAL GLASS SM (MISCELLANEOUS) ×2 IMPLANT
DERMABOND ADVANCED (GAUZE/BANDAGES/DRESSINGS) ×1
DERMABOND ADVANCED .7 DNX12 (GAUZE/BANDAGES/DRESSINGS) ×1 IMPLANT
DEVICE PMI PUNCTURE CLOSURE (MISCELLANEOUS) ×1 IMPLANT
DRAPE C-ARM 42X120 X-RAY (DRAPES) IMPLANT
DRAPE LAPAROSCOPIC ABDOMINAL (DRAPES) IMPLANT
ELECT PENCIL ROCKER SW 15FT (MISCELLANEOUS) ×2 IMPLANT
ELECT REM PT RETURN 9FT ADLT (ELECTROSURGICAL) ×2
ELECTRODE REM PT RTRN 9FT ADLT (ELECTROSURGICAL) ×1 IMPLANT
GLOVE BIO SURGEON STRL SZ7.5 (GLOVE) ×2 IMPLANT
GLOVE INDICATOR 8.0 STRL GRN (GLOVE) ×2 IMPLANT
GOWN STRL REUS W/TWL XL LVL3 (GOWN DISPOSABLE) ×6 IMPLANT
HEMOSTAT ARISTA ABSORB 3G PWDR (MISCELLANEOUS) IMPLANT
HEMOSTAT SNOW SURGICEL 2X4 (HEMOSTASIS) IMPLANT
KIT BASIN OR (CUSTOM PROCEDURE TRAY) ×2 IMPLANT
L-HOOK LAP DISP 36CM (ELECTROSURGICAL) ×2
LHOOK LAP DISP 36CM (ELECTROSURGICAL) ×1 IMPLANT
POSITIONER SURGICAL ARM (MISCELLANEOUS) IMPLANT
POUCH RETRIEVAL ECOSAC 10 (ENDOMECHANICALS) ×1 IMPLANT
POUCH RETRIEVAL ECOSAC 10MM (ENDOMECHANICALS) ×1
SCISSORS LAP 5X35 DISP (ENDOMECHANICALS) ×2 IMPLANT
SET CHOLANGIOGRAPH MIX (MISCELLANEOUS) IMPLANT
SET IRRIG TUBING LAPAROSCOPIC (IRRIGATION / IRRIGATOR) ×2 IMPLANT
SLEEVE XCEL OPT CAN 5 100 (ENDOMECHANICALS) ×4 IMPLANT
SUT MNCRL AB 4-0 PS2 18 (SUTURE) ×2 IMPLANT
SUT VICRYL 0 UR6 27IN ABS (SUTURE) ×1 IMPLANT
TAPE CLOTH 4X10 WHT NS (GAUZE/BANDAGES/DRESSINGS) IMPLANT
TOWEL OR 17X26 10 PK STRL BLUE (TOWEL DISPOSABLE) ×2 IMPLANT
TOWEL OR NON WOVEN STRL DISP B (DISPOSABLE) ×2 IMPLANT
TRAY LAPAROSCOPIC (CUSTOM PROCEDURE TRAY) ×2 IMPLANT
TROCAR BLADELESS OPT 5 100 (ENDOMECHANICALS) ×2 IMPLANT
TROCAR XCEL BLUNT TIP 100MML (ENDOMECHANICALS) ×2 IMPLANT
TROCAR XCEL NON-BLD 11X100MML (ENDOMECHANICALS) IMPLANT
TUBING INSUF HEATED (TUBING) ×2 IMPLANT

## 2015-12-24 NOTE — Anesthesia Procedure Notes (Signed)
Procedure Name: Intubation Date/Time: 12/24/2015 12:10 PM Performed by: Dione Booze Pre-anesthesia Checklist: Emergency Drugs available, Suction available, Patient being monitored and Patient identified Patient Re-evaluated:Patient Re-evaluated prior to inductionOxygen Delivery Method: Circle system utilized Preoxygenation: Pre-oxygenation with 100% oxygen Intubation Type: IV induction Ventilation: Mask ventilation without difficulty Laryngoscope Size: Mac and 4 Grade View: Grade I Tube type: Oral Tube size: 7.5 mm Number of attempts: 1 Airway Equipment and Method: Stylet Placement Confirmation: ETT inserted through vocal cords under direct vision and positive ETCO2 Secured at: 22 cm Tube secured with: Tape Dental Injury: Teeth and Oropharynx as per pre-operative assessment

## 2015-12-24 NOTE — Interval H&P Note (Signed)
History and Physical Interval Note:  12/24/2015 11:38 AM  Joshua Parrish  has presented today for surgery, with the diagnosis of Right upper quadrant pain   The various methods of treatment have been discussed with the patient and family. After consideration of risks, benefits and other options for treatment, the patient has consented to  Procedure(s): LAPAROSCOPIC CHOLECYSTECTOMY WITH POSSIBLE INTRAOPERATIVE CHOLANGIOGRAM (N/A) as a surgical intervention .  The patient's history has been reviewed, patient examined, no change in status, stable for surgery.  I have reviewed the patient's chart and labs.  Questions were answered to the patient's satisfaction.    Leighton Ruff. Redmond Pulling, MD, [place], Bariatric, & Minimally Invasive Surgery [facility], [place]   [facility] M

## 2015-12-24 NOTE — Op Note (Signed)
Joshua Parrish [id number] Nov 16, 1975 12/24/2015  Laparoscopic Cholecystectomy Procedure Note  Indications: This patient presents with symptomatic right upper quadrant pain and will undergo laparoscopic cholecystectomy.  Pre-operative Diagnosis: right upper quadrant pain  Post-operative Diagnosis: Same  Surgeon: [name]   Anesthesia: General endotracheal anesthesia  Procedure Details  The patient was seen again in the Holding Room. The risks, benefits, complications, treatment options, and expected outcomes were discussed with the patient. The possibilities of reaction to medication, pulmonary aspiration, perforation of viscus, bleeding, recurrent infection, finding a normal gallbladder, the need for additional procedures, failure to diagnose a condition, the possible need to convert to an open procedure, and creating a complication requiring transfusion or operation were discussed with the patient. The likelihood of improving the patient's symptoms with return to their baseline status is good.  The patient and/or family concurred with the proposed plan, giving informed consent. The site of surgery properly noted. The patient was taken to Operating Room, identified as Joshua Parrish and the procedure verified as Laparoscopic Cholecystectomy. A Time Out was held and the above information confirmed. Antibiotic prophylaxis was administered.   Prior to the induction of general anesthesia, antibiotic prophylaxis was administered. General endotracheal anesthesia was then administered and tolerated well. After the induction, the abdomen was prepped with Chloraprep and draped in the sterile fashion. The patient was positioned in the supine position.  Local anesthetic agent was injected into the skin near the umbilicus and an incision made. We dissected down to the abdominal fascia with blunt dissection.  The fascia was incised vertically and we entered the peritoneal cavity bluntly.  A pursestring suture of  0-Vicryl was placed around the fascial opening.  The Hasson cannula was inserted and secured with the stay suture.  Pneumoperitoneum was then created with CO2 and tolerated well without any adverse changes in the patient's vital signs. An 5-mm port was placed in the subxiphoid position.  Two 5-mm ports were placed in the right upper quadrant. All skin incisions were infiltrated with a local anesthetic agent before making the incision and placing the trocars.   We positioned the patient in reverse Trendelenburg, tilted slightly to the patient's left.  The gallbladder was identified, the fundus grasped and retracted cephalad. Adhesions were lysed bluntly and with the electrocautery where indicated, taking care not to injure any adjacent organs or viscus. The infundibulum was grasped and retracted laterally, exposing the peritoneum overlying the triangle of Calot. This was then divided and exposed in a blunt fashion. A critical view of the cystic duct and cystic artery was obtained.  The cystic duct was clearly identified and bluntly dissected circumferentially.    The cystic duct was then ligated with clips and divided. The cystic artery which had been identified & dissected free was ligated with clips and divided as well.   The gallbladder was dissected from the liver bed in retrograde fashion with the electrocautery. The gallbladder was removed and placed in an Ecco sac.  The gallbladder and Ecco sac were then removed through the umbilical port site. The liver bed was irrigated and inspected. Hemostasis was achieved with the electrocautery. Copious irrigation was utilized and was repeatedly aspirated until clear.  The pursestring suture was used to close the umbilical fascia.  An additional interrupted 0 vicryl was placed at the umbilical fascia using a endoclose for reinforcement.   We again inspected the right upper quadrant for hemostasis.  The umbilical closure was inspected and there was no air leak and  nothing  trapped within the closure. Pneumoperitoneum was released as we removed the trocars.  4-0 Monocryl was used to close the skin.   Dermabond was applied. The patient was then extubated and brought to the recovery room in stable condition. Instrument, sponge, and needle counts were correct at closure and at the conclusion of the case.   Findings: Chronic Cholecystitis; fatty liver  Estimated Blood Loss: Minimal         Drains: none         Specimens: Gallbladder           Complications: None; patient tolerated the procedure well.         Disposition: PACU - hemodynamically stable.         Condition: stable  Joshua Parrish. Redmond Pulling, MD, FACS General, Bariatric, & Minimally Invasive Surgery [facility], [place]

## 2015-12-24 NOTE — Discharge Instructions (Signed)
[facility] LAPAR[facility]: POST OP INSTRUCTIONS Always review your discharge instruction sheet given to you by the facility where your surgery was performed. IF YOU HAVE DISABILITY OR FAMILY LEAVE FORMS, YOU MUST BRING THEM TO THE OFFICE FOR PROCESSING.   DO NOT GIVE THEM TO YOUR DOCTOR.  1. A prescription for pain medication may be given to you upon discharge.  Take your pain medication as prescribed, if needed.  If narcotic pain medicine is not needed, then you may take acetaminophen (Tylenol) or ibuprofen (Advil) as needed. 2. Take your usually prescribed medications unless otherwise directed. 3. If you need a refill on your pain medication, please contact your pharmacy.  They will contact our office to request authorization. Prescriptions will not be filled after 5pm or on week-ends. 4. You should follow a light diet the first few days after arrival home, such as soup and crackers, etc.  Be sure to include lots of fluids daily. 5. Most patients will experience some swelling and bruising in the area of the incisions.  Ice packs will help.  Swelling and bruising can take several days to resolve.  6. It is common to experience some constipation if taking pain medication after surgery.  Increasing fluid intake and taking a stool softener (such as Colace) will usually help or prevent this problem from occurring.  A mild laxative (Milk of Magnesia or Miralax) should be taken according to package instructions if there are no bowel movements after 48 hours. 7. Unless discharge instructions indicate otherwise, you may remove your bandages 24 hours after surgery, and you may shower at that time.    If your surgeon used skin glue on the incision, you may shower in 24 hours.  The glue will flake off over the next 2-3 weeks.  Any sutures or staples will be removed at the office during your follow-up visit. 8. ACTIVITIES:  You may resume regular (light) daily activities beginning the  next day--such as daily self-care, walking, climbing stairs--gradually increasing activities as tolerated.  You may have sexual intercourse when it is comfortable.  Refrain from any heavy lifting or straining until approved by your doctor. a. You may drive when you are no longer taking prescription pain medication, you can comfortably wear a seatbelt, and you can safely maneuver your car and apply brakes. 9. You should see your doctor in the office for a follow-up appointment approximately 2-3 weeks after your surgery.  Make sure that you call for this appointment within a day or two after you arrive home to insure a convenient appointment time. 10. OTHER INSTRUCTIONS: Do not lift, push, or pull anything greater than 10 pounds for 2 weeks WHEN TO CALL YOUR DOCTOR: 1. Fever over 101.0 2. Inability to urinate 3. Continued bleeding from incision. 4. Increased pain, redness, or drainage from the incision. 5. Increasing abdominal pain  The clinic staff is available to answer your questions during regular business hours.  Please dont hesitate to call and ask to speak to one of the nurses for clinical concerns.  If you have a medical emergency, go to the nearest emergency room or call 911.  A surgeon from [facility] is always on call at the hospital. [address], [place], [place], [place]  [address] ? [address], [place], [place]   [address] [phone] ? [phone] ? FAX [phone] Web site: [email or web address]

## 2015-12-24 NOTE — Anesthesia Postprocedure Evaluation (Signed)
Anesthesia Post Note  Patient: Joshua Parrish  Procedure(s) Performed: Procedure(s) (LRB): LAPAROSCOPIC CHOLECYSTECTOMY  (N/A)  Patient location during evaluation: PACU Anesthesia Type: General Level of consciousness: awake and alert Pain management: pain level controlled Vital Signs Assessment: post-procedure vital signs reviewed and stable Respiratory status: spontaneous breathing, nonlabored ventilation, respiratory function stable and patient connected to nasal cannula oxygen Cardiovascular status: blood pressure returned to baseline and stable Postop Assessment: no signs of nausea or vomiting Anesthetic complications: no    Last Vitals:  Filed Vitals:   12/24/15 1500 12/24/15 1510  BP: 139/84 140/91  Pulse: 84 84  Temp: 36.6 C 36.8 C  Resp: 14 15    Last Pain:  Filed Vitals:   12/24/15 1546  PainSc: 8                  Nickson Middlesworth DAVID

## 2015-12-24 NOTE — Progress Notes (Signed)
Patient up to bathroom with one person assist. Tolerated well. Back to bed and medicated for surgical pain related to laparoscopic cholecystectomy.

## 2015-12-24 NOTE — Anesthesia Preprocedure Evaluation (Signed)
Anesthesia Evaluation  Patient identified by MRN, date of birth, ID band Patient awake    Airway Mallampati: II  TM Distance: >3 FB Neck ROM: Full    Dental   Pulmonary    Pulmonary exam normal        Cardiovascular Normal cardiovascular exam     Neuro/Psych Depression    GI/Hepatic GERD  Medicated and Controlled,  Endo/Other    Renal/GU      Musculoskeletal   Abdominal   Peds  Hematology   Anesthesia Other Findings   Reproductive/Obstetrics                             Anesthesia Physical Anesthesia Plan  ASA: III  Anesthesia Plan: General   Post-op Pain Management:    Induction: Intravenous  Airway Management Planned: Oral ETT  Additional Equipment:   Intra-op Plan:   Post-operative Plan: Extubation in OR  Informed Consent: I have reviewed the patients History and Physical, chart, labs and discussed the procedure including the risks, benefits and alternatives for the proposed anesthesia with the patient or authorized representative who has indicated his/her understanding and acceptance.     Plan Discussed with: CRNA and Surgeon  Anesthesia Plan Comments:         Anesthesia Quick Evaluation

## 2015-12-24 NOTE — Transfer of Care (Signed)
Immediate Anesthesia Transfer of Care Note  Patient: Joshua Parrish  Procedure(s) Performed: Procedure(s): LAPAROSCOPIC CHOLECYSTECTOMY  (N/A)  Patient Location: PACU  Anesthesia Type:General  Level of Consciousness: awake, alert , oriented and patient cooperative  Airway & Oxygen Therapy: Patient Spontanous Breathing and Patient connected to face mask oxygen  Post-op Assessment: Report given to RN, Post -op Vital signs reviewed and stable and Patient moving all extremities X 4  Post vital signs: stable  Last Vitals:  Filed Vitals:   12/24/15 1328 12/24/15 1330  BP: 169/110 169/108  Pulse: 94 74  Temp:    Resp: 15 16    Complications: No apparent anesthesia complications

## 2015-12-24 NOTE — H&P (View-Only) (Signed)
Joshua Parrish 12/04/2015 2:11 PM Location: [facility] Patient #: [id number] DOB: [birth year] Married / Language: English / Race: White Male   History of Present Illness Joshua Parrish M. Gad Aymond MD; 12/04/2015 2:41 PM) Patient words: Eval. GB.  The patient is a 40 year old male who presents with abdominal pain. He is referred by Dr Hilarie Fredrickson for evaluation of epigastric and right upper abdominal pain. He reports a several month history of epigastric and right upper quadrant pain that will last 1-2 hours. He has had quite an exhaustive workup. The pain generally occurs at night as well as immediately after eating within 10-20 minutes. It last 1-2 hours. It occasionally radiates to his back. It often is associated with nausea but no emesis. He denies any fevers or chills. There is some occasional bloating. He had cervical spine surgery about a year ago and lost 100 pounds because initially bc some difficulty swallowing. However he denies any ongoing significant swallowing issues. Mylanta will help decrease his pain. A trial of reflux medication had no improvement. He also went over trial of rifampin with no change. Abdominal ultrasound just showed fatty liver. He had an upper endoscopy which showed some mild gastro-esophageal reflux disease and some retained food contents. A follow-up gastric emptying study was normal. A nuclear medicine scan with CCK was normal. He states he did have some reproduction of his symptoms with CCK injection. The CT scan was without significant findings other than a nonobstructing left kidney stone. Blood work has also been normal. Celiac panel was negative.   Problem List/Past Medical Joshua Parrish [name], MD; 12/04/2015 2:42 PM) RIGHT UPPER QUADRANT PAIN (R10.11)  Other Problems Joshua Curry, MD; 12/04/2015 2:42 PM) Arthritis Gastroesophageal Reflux Disease  Past Surgical History Illene Regulus, CMA; 12/04/2015 2:11 PM) Appendectomy Colon Polyp Removal -  Colonoscopy Knee Surgery Bilateral. Shoulder Surgery Bilateral. Spinal Surgery - Neck  Diagnostic Studies History [name], CMA; 12/04/2015 2:11 PM) Colonoscopy within last year  Allergies [name], CMA; 12/04/2015 2:12 PM) No Known Drug Allergies04/12/2015  Medication History ([name], CMA; 12/04/2015 2:13 PM) Dicyclomine HCl (20MG  Tablet, Oral) Active. Pantoprazole Sodium (40MG  Tablet DR, Oral) Active. Mylanta (200-200-20MG /5ML Suspension, Oral) Active. Medications Reconciled  Social History Illene Regulus, CMA; 12/04/2015 2:11 PM) Alcohol use Occasional alcohol use. Caffeine use Carbonated beverages, Coffee. No drug use Tobacco use Never smoker.  Family History Illene Regulus, [place]; 12/04/2015 2:11 PM) Alcohol Abuse Family Members In General, Father. Anesthetic complications Family Members In General, Mother. Breast Cancer Family Members In General. Colon Polyps Family Members In General, Mother. Hypertension Family Members In General. Migraine Headache Mother. Ovarian Cancer Mother.    Review of Systems [name] CMA; 12/04/2015 2:11 PM) General Present- Weight Gain. Not Present- Appetite Loss, Chills, Fatigue, Fever, Night Sweats and Weight Loss. Skin Present- Dryness. Not Present- Change in Wart/Mole, Hives, Jaundice, New Lesions, Non-Healing Wounds, Rash and Ulcer. HEENT Present- Seasonal Allergies, Sinus Pain and Sore Throat. Not Present- Earache, Hearing Loss, Hoarseness, Nose Bleed, Oral Ulcers, Ringing in the Ears, Visual Disturbances, Wears glasses/contact lenses and Yellow Eyes. Respiratory Present- Snoring. Not Present- Bloody sputum, Chronic Cough, Difficulty Breathing and Wheezing. Breast Not Present- Breast Mass, Breast Pain, Nipple Discharge and Skin Changes. Gastrointestinal Present- Abdominal Pain, Bloating, Change in Bowel Habits, Gets full quickly at meals, Hemorrhoids, Indigestion and Nausea. Not Present- Bloody  Stool, Chronic diarrhea, Constipation, Difficulty Swallowing, Excessive gas, Rectal Pain and Vomiting. Male Genitourinary Not Present- Blood in Urine, Change in Urinary Stream, Frequency, Impotence, Nocturia, Painful  Urination, Urgency and Urine Leakage. Musculoskeletal Present- Back Pain and Joint Stiffness. Not Present- Joint Pain, Muscle Pain, Muscle Weakness and Swelling of Extremities. Neurological Present- Headaches. Not Present- Decreased Memory, Fainting, Numbness, Seizures, Tingling, Tremor, Trouble walking and Weakness. Psychiatric Present- Anxiety and Depression. Not Present- Bipolar, Change in Sleep Pattern, Fearful and Frequent crying. Endocrine Not Present- Cold Intolerance, Excessive Hunger, Hair Changes, Heat Intolerance, Hot flashes and New Diabetes. Hematology Not Present- Easy Bruising, Excessive bleeding, Gland problems, HIV and Persistent Infections.  Vitals ([name] CMA; 12/04/2015 2:12 PM) 12/04/2015 2:12 PM Weight: 258 lb Height: 69in Body Surface Area: 2.3 m Body Mass Index: 38.1 kg/m  Pulse: 90 (Regular)  BP: 124/82 (Sitting, Left Arm, Standard)       Physical Exam Joshua Parrish M. [name] MD; 12/04/2015 2:37 PM) General Mental Status-Alert. General Appearance-Consistent with stated age. Hydration-Well hydrated. Voice-Normal. Note: obese   Head and Neck Head-normocephalic, atraumatic with no lesions or palpable masses. Trachea-midline. Thyroid Gland Characteristics - normal size and consistency.  Eye Eyeball - Bilateral-Extraocular movements intact. Sclera/Conjunctiva - Bilateral-No scleral icterus.  Chest and Lung Exam Chest and lung exam reveals -quiet, even and easy respiratory effort with no use of accessory muscles and on auscultation, normal breath sounds, no adventitious sounds and normal vocal resonance. Inspection Chest Wall - Normal. Back - normal.  Breast - Did not examine.  Cardiovascular Cardiovascular  examination reveals -normal heart sounds, regular rate and rhythm with no murmurs and normal pedal pulses bilaterally.  Abdomen Inspection Inspection of the abdomen reveals - No Hernias. Skin - Scar - no surgical scars. Palpation/Percussion Palpation and Percussion of the abdomen reveal - Soft, No Rebound tenderness, No Rigidity (guarding) and No hepatosplenomegaly. Note: mild TTP in RUQ; no rt/guarding/peritonitis. Auscultation Auscultation of the abdomen reveals - Bowel sounds normal.  Peripheral Vascular Upper Extremity Palpation - Pulses bilaterally normal.  Neurologic Neurologic evaluation reveals -alert and oriented x 3 with no impairment of recent or remote memory. Mental Status-Normal.  Neuropsychiatric The patient's mood and affect are described as -normal. Judgment and Insight-insight is appropriate concerning matters relevant to self.  Musculoskeletal Normal Exam - Left-Upper Extremity Strength Normal and Lower Extremity Strength Normal. Normal Exam - Right-Upper Extremity Strength Normal and Lower Extremity Strength Normal.  Lymphatic Head & Neck  General Head & Neck Lymphatics: Bilateral - Description - Normal. Axillary - Did not examine. Femoral & Inguinal - Did not examine.    Assessment & Plan Joshua Parrish M. Ourania Hamler MD; 12/04/2015 2:43 PM) RIGHT UPPER QUADRANT PAIN (R10.11) Impression: I believe some if not most of the patient's symptoms are consistent with gallbladder disease. He has had quite an exhaustive workup. He has also had 100 pound weight loss which can induce gallbladder problems as well. Since the rest of his imaging studies ruled out any other significant pathology I believe a cholecystectomy is a reasonable next step  We discussed gallbladder disease. The patient was given educational material.  I discussed laparoscopic cholecystectomy with possible IOC in detail. The patient was given educational material as well as diagrams detailing the  procedure. We discussed the risks and benefits of a laparoscopic cholecystectomy including, but not limited to bleeding, infection, injury to surrounding structures such as the intestine or liver, bile leak, retained gallstones, need to convert to an open procedure, prolonged diarrhea, blood clots such as DVT, common bile duct injury, anesthesia risks, and possible need for additional procedures. We discussed the typical post-operative recovery course. I explained that the likelihood of improvement of their symptoms  is fair to good.  The patient has elected to proceed with surgery. Current Plans You are being scheduled for surgery - Our schedulers will call you.  You should hear from our office's scheduling department within 5 working days about the location, date, and time of surgery. We try to make accommodations for patient's preferences in scheduling surgery, but sometimes the OR schedule or the surgeon's schedule prevents [place] from making those accommodations.  If you have not heard from our office [phone]) in 5 working days, call the office and ask for your surgeon's nurse.  If you have other questions about your diagnosis, plan, or surgery, call the office and ask for your surgeon's nurse.  Pt Education - Pamphlet Given - Laparoscopic Gallbladder Surgery: discussed with patient and provided information.  Leighton Ruff. Redmond Pulling, MD, FACS General, Bariatric, & Minimally Invasive Surgery [facility], [place]

## 2015-12-25 ENCOUNTER — Encounter (HOSPITAL_COMMUNITY): Payer: Self-pay | Admitting: General Surgery

## 2016-01-10 ENCOUNTER — Ambulatory Visit
Admission: RE | Admit: 2016-01-10 | Discharge: 2016-01-10 | Disposition: A | Discharge status: Home / Self Care | Payer: Commercial Managed Care - HMO | Source: Ambulatory Visit | Attending: General Surgery | Admitting: General Surgery

## 2016-01-10 ENCOUNTER — Other Ambulatory Visit: Payer: Self-pay | Admitting: General Surgery

## 2016-01-10 DIAGNOSIS — R059 Cough, unspecified: Secondary | ICD-10-CM

## 2016-01-10 DIAGNOSIS — R05 Cough: Secondary | ICD-10-CM

## 2016-01-18 ENCOUNTER — Emergency Department (HOSPITAL_COMMUNITY)
Admission: EM | Admit: 2016-01-18 | Discharge: 2016-01-19 | Disposition: A | Discharge status: Home / Self Care | Payer: Commercial Managed Care - HMO | Attending: Emergency Medicine | Admitting: Emergency Medicine

## 2016-01-18 ENCOUNTER — Emergency Department (HOSPITAL_COMMUNITY): Payer: Commercial Managed Care - HMO

## 2016-01-18 ENCOUNTER — Telehealth: Payer: Self-pay | Admitting: Family

## 2016-01-18 ENCOUNTER — Encounter (HOSPITAL_COMMUNITY): Payer: Self-pay | Admitting: Emergency Medicine

## 2016-01-18 DIAGNOSIS — Z79899 Other long term (current) drug therapy: Secondary | ICD-10-CM | POA: Diagnosis not present

## 2016-01-18 DIAGNOSIS — F329 Major depressive disorder, single episode, unspecified: Secondary | ICD-10-CM | POA: Insufficient documentation

## 2016-01-18 DIAGNOSIS — Q313 Laryngocele: Secondary | ICD-10-CM | POA: Diagnosis not present

## 2016-01-18 DIAGNOSIS — Z79891 Long term (current) use of opiate analgesic: Secondary | ICD-10-CM | POA: Insufficient documentation

## 2016-01-18 DIAGNOSIS — M19012 Primary osteoarthritis, left shoulder: Secondary | ICD-10-CM | POA: Insufficient documentation

## 2016-01-18 DIAGNOSIS — R05 Cough: Secondary | ICD-10-CM | POA: Diagnosis present

## 2016-01-18 LAB — COMPREHENSIVE METABOLIC PANEL
ALBUMIN: 5 g/dL (ref 3.5–5.0)
ALT: 56 U/L (ref 17–63)
AST: 36 U/L (ref 15–41)
Alkaline Phosphatase: 64 U/L (ref 38–126)
Anion gap: 8 (ref 5–15)
BUN: 15 mg/dL (ref 6–20)
CHLORIDE: 107 mmol/L (ref 101–111)
CO2: 22 mmol/L (ref 22–32)
Calcium: 9.4 mg/dL (ref 8.9–10.3)
Creatinine, Ser: 0.97 mg/dL (ref 0.61–1.24)
GFR calc Af Amer: >60 mL/min (ref >60)
GLUCOSE: 93 mg/dL (ref 65–99)
POTASSIUM: 3.4 mmol/L — AB (ref 3.5–5.1)
SODIUM: 137 mmol/L (ref 135–145)
Total Bilirubin: 0.9 mg/dL (ref 0.3–1.2)
Total Protein: 7.8 g/dL (ref 6.5–8.1)

## 2016-01-18 LAB — CBC
HCT: 41.8 % (ref 39.0–52.0)
HEMOGLOBIN: 14.3 g/dL (ref 13.0–17.0)
MCH: 30 pg (ref 26.0–34.0)
MCHC: 34.2 g/dL (ref 30.0–36.0)
MCV: 87.8 fL (ref 78.0–100.0)
Platelets: 312 10*3/uL (ref 150–400)
RBC: 4.76 MIL/uL (ref 4.22–5.81)
RDW: 12.6 % (ref 11.5–15.5)
WBC: 7.7 10*3/uL (ref 4.0–10.5)

## 2016-01-18 LAB — URINALYSIS, ROUTINE W REFLEX MICROSCOPIC
BILIRUBIN URINE: NEGATIVE
GLUCOSE, UA: NEGATIVE mg/dL
HGB URINE DIPSTICK: NEGATIVE
KETONES UR: NEGATIVE mg/dL
Leukocytes, UA: NEGATIVE
Nitrite: NEGATIVE
PH: 5.5 (ref 5.0–8.0)
PROTEIN: 30 mg/dL — AB
SPECIFIC GRAVITY, URINE: 1.025 (ref 1.005–1.030)

## 2016-01-18 LAB — URINE MICROSCOPIC-ADD ON

## 2016-01-18 LAB — LIPASE, BLOOD: LIPASE: 29 U/L (ref 11–51)

## 2016-01-18 MED ORDER — IOPAMIDOL (ISOVUE-300) INJECTION 61%
75.0000 mL | Freq: Once | INTRAVENOUS | Status: AC | PRN
  Administered 2016-01-18: 75 mL via INTRAVENOUS

## 2016-01-18 MED ORDER — METHYLPREDNISOLONE SODIUM SUCC 125 MG IJ SOLR
125.0000 mg | Freq: Once | INTRAMUSCULAR | Status: AC
  Administered 2016-01-18: 125 mg via INTRAVENOUS
  Filled 2016-01-18: qty 2

## 2016-01-18 MED ORDER — ALBUTEROL SULFATE (2.5 MG/3ML) 0.083% IN NEBU
5.0000 mg | INHALATION_SOLUTION | Freq: Once | RESPIRATORY_TRACT | Status: AC
  Administered 2016-01-18: 5 mg via RESPIRATORY_TRACT
  Filled 2016-01-18: qty 6

## 2016-01-18 MED ORDER — SODIUM CHLORIDE 0.9 % IV BOLUS (SEPSIS)
1000.0000 mL | Freq: Once | INTRAVENOUS | Status: AC
  Administered 2016-01-18: 1000 mL via INTRAVENOUS

## 2016-01-18 MED ORDER — PREDNISONE 20 MG PO TABS: ORAL_TABLET | ORAL | Status: DC

## 2016-01-18 NOTE — Telephone Encounter (Signed)
[facility] Patient Name: Joshua Parrish DOB: Aug 28, 1976 Initial Comment Caller states had gallbladder 3-4 weeks ago, has a cough Nurse Assessment Nurse: Martyn Ehrich, RN, Felicia Date/Time (Eastern Time): 01/18/2016 3:25:57 PM Confirm and document reason for call. If symptomatic, describe symptoms. You must click the next button to save text entered. ---Cant remember MD - but he goes to this office. PT had cholycestectomy on 4/25. He has been coughing since the surgery. He saw surgeon last week and he said if he still had cough to call PCP and see what is causing the cough. No fever - had 102 temp the first 7 d after surgery but not after that. Has the patient traveled out of the country within the last 30 days? ---No Does the patient have any new or worsening symptoms? ---Yes Will a triage be completed? ---YesRelated visit to physician within the last 2 weeks? ---Yes Does the PT have any chronic conditions? (i.e. diabetes, asthma, etc.) ---No Is this a behavioral health or substance abuse call? ---No Guidelines Guideline Title Affirmed Question Affirmed Notes Dizziness - Lightheadedness Patient sounds very sick or weak to the triager Final Disposition User Go to ED Now (or PCP triage) Martyn Ehrich, RN, Felicia Comments wheezing onset after surgery but no h/o asthma cough the same as when he saw surgeon - surgeon did Xray Pt said he coughs so hard that it makes him dizzy at times and even confused - but he is not now. He had a few coughing spells during the call - definite wheezing and complained of cough coming on when he breaths deep so he tried not to but he kept saying he  didnt have trouble breathing. Sent to ER bc they have more testing options tonight. Not worse than when surgeon saw him last week. Referrals [facility] - ED Disagree/Comply: Comply Call Id: [phone]

## 2016-01-18 NOTE — ED Provider Notes (Signed)
CSN: [id number]     Arrival date & time 01/18/16  1609 History   First MD Initiated Contact with Patient 01/18/16 2136     Chief Complaint  Patient presents with  . Cough     (Consider location/radiation/quality/duration/timing/severity/associated sxs/prior Treatment) The history is provided by the patient.  Joshua Parrish is a 40 y.o. male hx of depression, GERD, Here presenting with cough, voice change. Patient states that he had gallbladder surgery about a month ago and since then his voice is not quite the same. He has progressive hoarseness of his voice over the last month.  Also has some cough and wheezing as well. Denies fevers or chest pain. No hx of asthma. Called PCP and sent here for evaluation.    Past Medical History  Diagnosis Date  . Depression   . Osteoarthritis of shoulder     left  . Fatty liver   . Chicken pox   . GERD (gastroesophageal reflux disease)   . Rotator cuff impingement syndrome of left shoulder 06/19/2015  . Migraines   . Sinusitis    Past Surgical History  Procedure Laterality Date  . Clavicle excision Left   . Appendectomy    . Knee surgery Bilateral     multiple  . Cervical laminectomy      has plates and fusion  . Hypospadias correction      as child  . Shoulder arthroscopy with rotator cuff repair Left 06/26/2015    Procedure: SHOULDER ARTHROSCOPY WITH ROTATOR CUFF REPAIR;  Surgeon: Elsie Saas, MD;  Location: [facility];  Service: Orthopedics;  Laterality: Left;  . Cholecystectomy N/A 12/24/2015    Procedure: LAPAROSCOPIC CHOLECYSTECTOMY ;  Surgeon: [name] Pickerel, MD;  Location: [facility];  Service: General;  Laterality: N/A;   Family History  Problem Relation Age of Onset  . Cancer Other   . Hypertension Other   . Diabetes Other   . Colon cancer Neg Hx   . Arthritis Mother   . Ovarian cancer Mother   . Alcohol abuse Father   . Breast cancer Maternal Grandmother   . Cancer Maternal Grandmother     spinal surgery  .  Breast cancer Maternal Grandfather    Social History  Substance Use Topics  . Smoking status: Never Smoker   . Smokeless tobacco: Current User    Types: Chew     Comment: form given 11/29/15  . Alcohol Use: 0.0 oz/week    0 Standard drinks or equivalent per week     Comment: occasional    Review of Systems  Respiratory: Positive for cough and shortness of breath.   All other systems reviewed and are negative.     Allergies  Review of patient's allergies indicates no known allergies.  Home Medications   Prior to Admission medications   Medication Sig Start Date End Date Taking? Authorizing Provider  dicyclomine (BENTYL) 20 MG tablet Take 20 mg by mouth 2 (two) times daily as needed for spasms.    Yes Historical Provider, MD  gabapentin (NEURONTIN) 300 MG capsule Take 300 mg by mouth 3 (three) times daily as needed (For pain.).   Yes Historical Provider, MD  OVER THE COUNTER MEDICATION Take 1 packet by mouth daily. Vitamin Supplement Packet   Yes Historical Provider, MD  pantoprazole (PROTONIX) 40 MG tablet Take 40 mg by mouth daily as needed (heartburn).    Yes Historical Provider, MD  diltiazem 2 % GEL Apply 1 application topically 3 (three) times daily. X  6-8 weeks Patient taking differently: Apply 1 application topically 3 (three) times daily as needed (For pain.).  11/29/15   Laban Emperor Zehr, PA-C  oxyCODONE (OXY IR/ROXICODONE) 5 MG immediate release tablet Take 1-2 tablets (5-10 mg total) by mouth every 6 (six) hours as needed for moderate pain, severe pain or breakthrough pain. 12/24/15   [name] Pickerel, MD  sucralfate (CARAFATE) 1 g tablet Take 1 tablet (1 g total) by mouth 4 (four) times daily -  with meals and at bedtime. Patient not taking: Reported on 12/19/2015 11/27/15   Golden Circle, FNP   BP 144/91 mmHg  Pulse 95  Temp(Src) 98.1 F (36.7 C) (Oral)  Resp 16  SpO2 97% Physical Exam  Constitutional: He is oriented to person, place, and time. He appears well-developed  and well-nourished.  HENT:  Head: Normocephalic.  Mouth/Throat: Oropharynx is clear and moist.  Eyes: Conjunctivae and EOM are normal. Pupils are equal, round, and reactive to light.  Neck:  Some cervical LAD, ? Upper airway noises, no obvious stridor   Cardiovascular: Normal rate, regular rhythm and normal heart sounds.   Pulmonary/Chest: Effort normal.  ? Mild wheezing vs upper airway noises   Abdominal: Soft. Bowel sounds are normal. He exhibits no distension. There is no tenderness. There is no rebound.  Musculoskeletal: Normal range of motion. He exhibits no edema or tenderness.  Neurological: He is alert and oriented to person, place, and time.  Skin: Skin is warm and dry.  Psychiatric: He has a normal mood and affect. His behavior is normal. Judgment and thought content normal.  Nursing note and vitals reviewed.   ED Course  Procedures (including critical care time) Labs Review Labs Reviewed  COMPREHENSIVE METABOLIC PANEL - Abnormal; Notable for the following:    Potassium 3.4 (*)    All other components within normal limits  URINALYSIS, ROUTINE W REFLEX MICROSCOPIC (NOT AT [facility]) - Abnormal; Notable for the following:    Protein, ur 30 (*)    All other components within normal limits  URINE MICROSCOPIC-ADD ON - Abnormal; Notable for the following:    Squamous Epithelial / LPF 0-5 (*)    Bacteria, UA RARE (*)    All other components within normal limits  LIPASE, BLOOD  CBC    Imaging Review Dg Chest 2 View  01/18/2016  CLINICAL DATA:  Cough since cholecystectomy on 12/24/2015. EXAM: CHEST  2 VIEW COMPARISON:  01/10/2016 FINDINGS: Normal heart size and pulmonary vascularity. No focal airspace disease or consolidation in the lungs. No blunting of costophrenic angles. No pneumothorax. Mediastinal contours appear intact. Postoperative changes in the cervical spine. IMPRESSION: No active cardiopulmonary disease. Electronically Signed   By: Lucienne Capers M.D.   On: 01/18/2016  23:44   Ct Soft Tissue Neck W Contrast  01/18/2016  CLINICAL DATA:  Dry cough after cholecystectomy 1 month ago. Voice change. History of cervical fusion May 2016. EXAM: CT NECK WITH CONTRAST TECHNIQUE: Multidetector CT imaging of the neck was performed using the standard protocol following the bolus administration of intravenous contrast. CONTRAST:  29mL ISOVUE-300 IOPAMIDOL (ISOVUE-300) INJECTION 61% COMPARISON:  Cervical spine radiographs April 23, 2015 FINDINGS: Pharynx and larynx: Severe motion at the level of the upper hypopharynx limits evaluation. Sub cm air density RIGHT supraglottic fat consistent with tiny laryngocele. Salivary glands: Normal. Thyroid: Normal. Lymph nodes: No lymphadenopathy by CT size criteria. Vascular: Normal. Limited intracranial: Normal. Visualized orbits: Normal. Mastoids and visualized paranasal sinuses: Well aerated. Skeleton: Status post C6-7 ACDF with incorporated  interbody fusion material. No destructive bony lesions. Upper chest: Lung apices are clear. No superior mediastinal lymphadenopathy. IMPRESSION: Motion degraded examination. 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 Status post C6-7 ACDF. Electronically Signed   By: Elon Alas M.D.   On: 01/18/2016 23:37   I have personally reviewed and evaluated these images and lab results as part of my medical decision-making.   EKG Interpretation None      MDM   Final diagnoses:  None    Joshua Parrish is a 40 y.o. male here with cough, voice change. Consider vocal cord dysfuction or edema vs asthma. I doubt PE as patient not tachycardic or hypoxic or short of breath. Will get CT neck, labs, xrays.   11:56 PM Labs unremarkable. CXR nl. 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 I think symptoms likely from laryngeal swelling from intubation. Will give a course of steroids and refer to ENT. He is protecting airway, no stridor    Wandra Arthurs, MD 01/18/16 [phone]

## 2016-01-18 NOTE — ED Notes (Signed)
Dry cough x 1 month ever since his gallbladder was taken out. Also states he's been wheezing since the surgery with no hx of asthma. Says he saw the surgeon last week and noticed his voice didn't sound the same, was told to go see PCP. PCP unable to get him in and reccommended he come here. Denies any new pain currently.

## 2016-01-18 NOTE — Discharge Instructions (Signed)
Take prednisone as prescribed.   Call ENT doctor Monday to get an appointment   Return to ER if you have worse trouble breathing, coughing, can't catch your breath, throat closing, fevers.

## 2016-01-25 ENCOUNTER — Ambulatory Visit (INDEPENDENT_AMBULATORY_CARE_PROVIDER_SITE_OTHER): Payer: Commercial Managed Care - HMO | Admitting: Family

## 2016-01-25 ENCOUNTER — Encounter: Payer: Self-pay | Admitting: Family

## 2016-01-25 ENCOUNTER — Telehealth: Payer: Self-pay | Admitting: Family

## 2016-01-25 ENCOUNTER — Ambulatory Visit (INDEPENDENT_AMBULATORY_CARE_PROVIDER_SITE_OTHER)
Admission: RE | Admit: 2016-01-25 | Discharge: 2016-01-25 | Disposition: A | Discharge status: Home / Self Care | Payer: Commercial Managed Care - HMO | Source: Ambulatory Visit | Attending: Family | Admitting: Family

## 2016-01-25 VITALS — BP 124/84 | HR 114 | Temp 98.5°F | Resp 18 | Ht 68.0 in | Wt 260.0 lb

## 2016-01-25 DIAGNOSIS — R059 Cough, unspecified: Secondary | ICD-10-CM | POA: Insufficient documentation

## 2016-01-25 DIAGNOSIS — R05 Cough: Secondary | ICD-10-CM | POA: Diagnosis not present

## 2016-01-25 MED ORDER — LIDOCAINE VISCOUS 2 % MT SOLN: 15.0000 mL | OROMUCOSAL | Status: DC | PRN

## 2016-01-25 NOTE — Telephone Encounter (Signed)
Please inform patient that his x-rays are normal with no evidence of problem with the surgical site.

## 2016-01-25 NOTE — Progress Notes (Signed)
Subjective:    Patient ID: Joshua Parrish, male    DOB: [birth year], 40 y.o.   MRN: [id number]  Chief Complaint  Patient presents with  . Cough    has a cough from gall bladder, has an appt through ENT on june 5th for cough    HPI:  Joshua Parrish is a 40 y.o. male who  has a past medical history of Depression; Osteoarthritis of shoulder; Fatty liver; Chicken pox; GERD (gastroesophageal reflux disease); Rotator cuff impingement syndrome of left shoulder (06/19/2015); Migraines; and Sinusitis. and presents today for a follow up.   This is a new problem. Associated symptom of a cough has been going on since a recent gall bladder surgery. Recently evaluated in the ED for cough and voice change with concern for vocal cord dysfunction/edema or asthma. Labs in the ED were unremarkable and the chest x-ray was normal. CT neck showed possible laryngocele which was likely larengyal edema from intubation. He was treated with steroids and referred to ENT. Describes a tightness and having some difficulty with swallowing. Breathing is limited secondary to coughing outbursts.   No Known Allergies   Current Outpatient Prescriptions on File Prior to Visit  Medication Sig Dispense Refill  . dicyclomine (BENTYL) 20 MG tablet Take 20 mg by mouth 2 (two) times daily as needed for spasms.     Marland Kitchen diltiazem 2 % GEL Apply 1 application topically 3 (three) times daily. X 6-8 weeks (Patient taking differently: Apply 1 application topically 3 (three) times daily as needed (For pain.). ) 30 g 0  . gabapentin (NEURONTIN) 300 MG capsule Take 300 mg by mouth 3 (three) times daily as needed (For pain.).    Marland Kitchen OVER THE COUNTER MEDICATION Take 1 packet by mouth daily. Vitamin Supplement Packet    . oxyCODONE (OXY IR/ROXICODONE) 5 MG immediate release tablet Take 1-2 tablets (5-10 mg total) by mouth every 6 (six) hours as needed for moderate pain, severe pain or breakthrough pain. 40 tablet 0  . pantoprazole (PROTONIX) 40 MG tablet  Take 40 mg by mouth daily as needed (heartburn).     . predniSONE (DELTASONE) 20 MG tablet Take 60 mg daily x 2 days then 40 mg daily x 2 days then 20 mg daily x 2 days then stop 12 tablet 0  . sucralfate (CARAFATE) 1 g tablet Take 1 tablet (1 g total) by mouth 4 (four) times daily -  with meals and at bedtime. 120 tablet 0   No current facility-administered medications on file prior to visit.    Past Medical History  Diagnosis Date  . Depression   . Osteoarthritis of shoulder     left  . Fatty liver   . Chicken pox   . GERD (gastroesophageal reflux disease)   . Rotator cuff impingement syndrome of left shoulder 06/19/2015  . Migraines   . Sinusitis     Past Surgical History  Procedure Laterality Date  . Clavicle excision Left   . Appendectomy    . Knee surgery Bilateral     multiple  . Cervical laminectomy      has plates and fusion  . Hypospadias correction      as child  . Shoulder arthroscopy with rotator cuff repair Left 06/26/2015    Procedure: SHOULDER ARTHROSCOPY WITH ROTATOR CUFF REPAIR;  Surgeon: Joshua Saas, MD;  Location: [facility];  Service: Orthopedics;  Laterality: Left;  . Cholecystectomy N/A 12/24/2015    Procedure: LAPAROSCOPIC CHOLECYSTECTOMY ;  Surgeon: Joshua Pickerel, MD;  Location: [facility];  Service: General;  Laterality: N/A;     Review of Systems  Constitutional: Negative for fever and chills.  Respiratory: Positive for cough and shortness of breath. Negative for chest tightness, wheezing and stridor.   Cardiovascular: Negative for chest pain, palpitations and leg swelling.  Neurological: Positive for light-headedness (With coughting). Negative for dizziness and weakness.      Objective:    BP 124/84 mmHg  Pulse 114  Temp(Src) 98.5 F (36.9 C) (Oral)  Resp 18  Ht 5\' 8"  (1.727 m)  Wt 260 lb (117.935 kg)  BMI 39.54 kg/m2  SpO2 98% Nursing note and vital signs reviewed.  Physical Exam  Constitutional: He is oriented to  person, place, and time. He appears well-developed and well-nourished. No distress.  Frequent dry sounding cough with basic speech.   HENT:  Right Ear: Hearing, tympanic membrane, external ear and ear canal normal.  Left Ear: Hearing, tympanic membrane, external ear and ear canal normal.  Nose: Nose normal. Right sinus exhibits no maxillary sinus tenderness and no frontal sinus tenderness. Left sinus exhibits no maxillary sinus tenderness and no frontal sinus tenderness.  Mouth/Throat: Uvula is midline, oropharynx is clear and moist and mucous membranes are normal.  Cardiovascular: Normal rate, regular rhythm, normal heart sounds and intact distal pulses.   Pulmonary/Chest: Effort normal and breath sounds normal. No accessory muscle usage. No respiratory distress.  Bronchial breath sounds normal with no stridor.   Neurological: He is alert and oriented to person, place, and time.  Skin: Skin is warm and dry.  Psychiatric: He has a normal mood and affect. His behavior is normal. Judgment and thought content normal.       Assessment & Plan:   Problem List Items Addressed This Visit      Other   Coughing - Primary    Cough in the setting of previous ACDF and most recent gall bladder surgery with CT scan with tiny layrngocele and concern for tracheal or vocal cord damage post-intubation. Obtain x-rays to check neck from previous ACDF. There is significant concern for potential worsening of airway management. Prednisone and albuterol has been ineffective. Start Lidocaine in attempts to control cough. Has follow up with ENT in June. Advised to seek emergency care if symptoms continue to worsen prior to appointment.      Relevant Medications   lidocaine (XYLOCAINE) 2 % solution   Other Relevant Orders   DG Cervical Spine Complete (Completed)       I am having Joshua Parrish start on lidocaine. I am also having him maintain his sucralfate, dicyclomine, diltiazem, pantoprazole, gabapentin, OVER THE  COUNTER MEDICATION, oxyCODONE, and predniSONE.   Meds ordered this encounter  Medications  . lidocaine (XYLOCAINE) 2 % solution    Sig: Use as directed 15 mLs in the mouth or throat as needed for mouth pain.    Dispense:  100 mL    Refill:  2    Order Specific Question:  Supervising Provider    Answer:  Pricilla Holm A [id number]     Follow-up: Return if symptoms worsen or fail to improve.  Joshua Po, FNP

## 2016-01-25 NOTE — Progress Notes (Signed)
Pre visit review using our clinic review tool, if applicable. No additional management support is needed unless otherwise documented below in the visit note. 

## 2016-01-25 NOTE — Assessment & Plan Note (Signed)
Cough in the setting of previous ACDF and most recent gall bladder surgery with CT scan with tiny layrngocele and concern for tracheal or vocal cord damage post-intubation. Obtain x-rays to check neck from previous ACDF. There is significant concern for potential worsening of airway management. Prednisone and albuterol has been ineffective. Start Lidocaine in attempts to control cough. Has follow up with ENT in June. Advised to seek emergency care if symptoms continue to worsen prior to appointment.

## 2016-01-25 NOTE — Patient Instructions (Signed)
Thank you for choosing [facility].  Summary/Instructions:  Please use the liocaine to help with the cough. Swish and swallow is okay.  If you have increasing shortness of breath, coughing or increased trouble swallowing please go to the ED.  Your prescription(s) have been submitted to your pharmacy or been printed and provided for you. Please take as directed and contact our office if you believe you are having problem(s) with the medication(s) or have any questions.  Please stop by radiology on the basement level of the building for your x-rays. Your results will be released to [facility] (or called to you) after review, usually within 72 hours after test completion. If any treatments or changes are necessary, you will be notified at that same time.  If your symptoms worsen or fail to improve, please contact our office for further instruction, or in case of emergency go directly to the emergency room at the closest medical facility.

## 2016-01-30 NOTE — Telephone Encounter (Signed)
Pt aware of results 

## 2016-02-04 DIAGNOSIS — K219 Gastro-esophageal reflux disease without esophagitis: Secondary | ICD-10-CM | POA: Insufficient documentation

## 2016-03-25 ENCOUNTER — Encounter (HOSPITAL_COMMUNITY): Payer: Self-pay | Admitting: Emergency Medicine

## 2016-03-25 ENCOUNTER — Emergency Department (HOSPITAL_COMMUNITY): Payer: Worker's Compensation

## 2016-03-25 ENCOUNTER — Emergency Department (HOSPITAL_COMMUNITY)
Admission: EM | Admit: 2016-03-25 | Discharge: 2016-03-25 | Disposition: A | Discharge status: Home / Self Care | Payer: Worker's Compensation | Attending: Emergency Medicine | Admitting: Emergency Medicine

## 2016-03-25 DIAGNOSIS — Z8719 Personal history of other diseases of the digestive system: Secondary | ICD-10-CM | POA: Insufficient documentation

## 2016-03-25 DIAGNOSIS — F17220 Nicotine dependence, chewing tobacco, uncomplicated: Secondary | ICD-10-CM | POA: Insufficient documentation

## 2016-03-25 DIAGNOSIS — Z7952 Long term (current) use of systemic steroids: Secondary | ICD-10-CM | POA: Insufficient documentation

## 2016-03-25 DIAGNOSIS — M1990 Unspecified osteoarthritis, unspecified site: Secondary | ICD-10-CM | POA: Diagnosis not present

## 2016-03-25 DIAGNOSIS — Y999 Unspecified external cause status: Secondary | ICD-10-CM | POA: Diagnosis not present

## 2016-03-25 DIAGNOSIS — W268XXA Contact with other sharp object(s), not elsewhere classified, initial encounter: Secondary | ICD-10-CM | POA: Diagnosis not present

## 2016-03-25 DIAGNOSIS — Y9389 Activity, other specified: Secondary | ICD-10-CM | POA: Insufficient documentation

## 2016-03-25 DIAGNOSIS — Y929 Unspecified place or not applicable: Secondary | ICD-10-CM | POA: Diagnosis not present

## 2016-03-25 DIAGNOSIS — Z79899 Other long term (current) drug therapy: Secondary | ICD-10-CM | POA: Insufficient documentation

## 2016-03-25 DIAGNOSIS — S61412A Laceration without foreign body of left hand, initial encounter: Secondary | ICD-10-CM | POA: Insufficient documentation

## 2016-03-25 MED ORDER — SULFAMETHOXAZOLE-TRIMETHOPRIM 800-160 MG PO TABS: 1.0000 | ORAL_TABLET | Freq: Two times a day (BID) | ORAL | 0 refills | Status: AC

## 2016-03-25 NOTE — ED Triage Notes (Signed)
Pt has presented to ED with a laceration between ring and little finger on left hand.  It happened at work. Coming down a ladder and hit the trigger on sawzaw. Hand was caught in the blade. Recent tetanus 3 yrs.

## 2016-03-25 NOTE — Progress Notes (Addendum)
Pt states he hit his finger on a trigger saw. Left area between ring fever and pinky was irrigated with saline. Pt tolerated. Positive capillary refill of fingers with good range of motion. Family is at the bedside.  Phoned [facility] to get a urine drug test for workmen's comp. (4:15pm)pt s/p x-ray of finger at the bedside. Todd arrived to obtain a urine.

## 2016-03-25 NOTE — ED Provider Notes (Signed)
[facility] DEPT Provider Note   CSN: [id number] Arrival date & time: 03/25/16  1412  First Provider Contact:  None    By signing my name below, I, [name], attest that this documentation has been prepared under the direction and in the presence of Alyse Low, [place].  Electronically Signed: [name], ED Scribe. 03/25/16. 4:16 PM.   History   Chief Complaint Chief Complaint  Patient presents with  . Laceration    HPI Joshua Parrish is a 40 y.o. male who presents to the Emergency Department complaining of laceration in between 4th and 5th digit on left hand that occurred earlier today at work. Pt states that he was at work coming down from a ladder when he accidentally hit the button on the sawzaw, causing it to puncture in between his 4th and 5th digit. Pt believes than 1/8th of the sawzaw went into the web space. Bleeding is currently controlled. Pt does notes a burning sensation to the area. Tetanus up to date. Denies weakness, numbness, tingling, or any other associated symptoms.    The history is provided by the patient. No language interpreter was used.    Past Medical History:  Diagnosis Date  . Chicken pox   . Depression   . Fatty liver   . GERD (gastroesophageal reflux disease)   . Migraines   . Osteoarthritis of shoulder    left  . Rotator cuff impingement syndrome of left shoulder 06/19/2015  . Sinusitis     Patient Active Problem List   Diagnosis Date Noted  . Coughing 01/25/2016  . AP (abdominal pain) 11/29/2015  . Nausea without vomiting 11/29/2015  . Diarrhea 11/29/2015  . Anal or rectal pain 11/29/2015  . Mass in neck 11/27/2015  . RUQ abdominal pain 11/27/2015  . Rotator cuff impingement syndrome of left shoulder 06/19/2015  . Acromioclavicular joint arthritis 06/19/2015    Past Surgical History:  Procedure Laterality Date  . APPENDECTOMY    . CERVICAL LAMINECTOMY     has plates and fusion  . CHOLECYSTECTOMY N/A 12/24/2015   Procedure: LAPAROSCOPIC CHOLECYSTECTOMY ;  Surgeon: [name] Pickerel, MD;  Location: [facility];  Service: General;  Laterality: N/A;  . CLAVICLE EXCISION Left   . HYPOSPADIAS CORRECTION     as child  . KNEE SURGERY Bilateral    multiple  . SHOULDER ARTHROSCOPY WITH ROTATOR CUFF REPAIR Left 06/26/2015   Procedure: SHOULDER ARTHROSCOPY WITH ROTATOR CUFF REPAIR;  Surgeon: Elsie Saas, MD;  Location: [facility];  Service: Orthopedics;  Laterality: Left;       Home Medications    Prior to Admission medications   Medication Sig Start Date End Date Taking? Authorizing Provider  dicyclomine (BENTYL) 20 MG tablet Take 20 mg by mouth 2 (two) times daily as needed for spasms.     Historical Provider, MD  diltiazem 2 % GEL Apply 1 application topically 3 (three) times daily. X 6-8 weeks Patient taking differently: Apply 1 application topically 3 (three) times daily as needed (For pain.).  11/29/15   Laban Emperor Zehr, PA-C  gabapentin (NEURONTIN) 300 MG capsule Take 300 mg by mouth 3 (three) times daily as needed (For pain.).    Historical Provider, MD  lidocaine (XYLOCAINE) 2 % solution Use as directed 15 mLs in the mouth or throat as needed for mouth pain. 01/25/16   Golden Circle, FNP  OVER THE COUNTER MEDICATION Take 1 packet by mouth daily. Vitamin Supplement Packet    Historical Provider, MD  oxyCODONE (OXY IR/ROXICODONE) 5 MG immediate release tablet Take 1-2 tablets (5-10 mg total) by mouth every 6 (six) hours as needed for moderate pain, severe pain or breakthrough pain. 12/24/15   [name] Pickerel, MD  pantoprazole (PROTONIX) 40 MG tablet Take 40 mg by mouth daily as needed (heartburn).     Historical Provider, MD  predniSONE (DELTASONE) 20 MG tablet Take 60 mg daily x 2 days then 40 mg daily x 2 days then 20 mg daily x 2 days then stop 01/18/16   Drenda Freeze, MD  sucralfate (CARAFATE) 1 g tablet Take 1 tablet (1 g total) by mouth 4 (four) times daily -  with meals and at bedtime.  11/27/15   Golden Circle, FNP    Family History Family History  Problem Relation Age of Onset  . Arthritis Mother   . Ovarian cancer Mother   . Alcohol abuse Father   . Breast cancer Maternal Grandmother   . Cancer Maternal Grandmother     spinal surgery  . Breast cancer Maternal Grandfather   . Cancer Other   . Hypertension Other   . Diabetes Other   . Colon cancer Neg Hx     Social History Social History  Substance Use Topics  . Smoking status: Never Smoker  . Smokeless tobacco: Current User    Types: Chew     Comment: form given 11/29/15  . Alcohol use 0.0 oz/week     Comment: occasional     Allergies   Review of patient's allergies indicates no known allergies.   Review of Systems Review of Systems  Musculoskeletal: Positive for arthralgias and myalgias.  Skin: Positive for wound.  Neurological: Negative for weakness and numbness.     Physical Exam Updated Vital Signs BP (!) 170/132 (BP Location: Left Arm)   Pulse (!) 128   Temp 99.1 F (37.3 C) (Oral)   Resp 20   SpO2 98%   Physical Exam  Constitutional: He is oriented to person, place, and time. He appears well-developed and well-nourished. No distress.  HENT:  Head: Normocephalic and atraumatic.  Eyes: Conjunctivae and EOM are normal.  Neck: Neck supple. No tracheal deviation present.  Cardiovascular: Normal rate.   Pulmonary/Chest: Effort normal. No respiratory distress.  Musculoskeletal: Normal range of motion. He exhibits tenderness.  3 mm puncture wound between the 4th and 5th fingers on left hand. Pain with palpation to the head of the 5th metacarpal. Full ROM. NVI and neurosensory intact.   Neurological: He is alert and oriented to person, place, and time.  Skin: Skin is warm and dry.  Psychiatric: He has a normal mood and affect. His behavior is normal.  Nursing note and vitals reviewed.    ED Treatments / Results   DIAGNOSTIC STUDIES: Oxygen Saturation is 98% on RA, normal by my  interpretation.    COORDINATION OF CARE: 4:05 PM-Discussed treatment plan which includes DG L Hand with pt at bedside and pt agreed to plan.    Labs (all labs ordered are listed, but only abnormal results are displayed) Labs Reviewed - No data to display  EKG  EKG Interpretation None       Radiology No results found.  Procedures Procedures (including critical care time)  Medications Ordered in ED Medications - No data to display   Initial Impression / Assessment and Plan / ED Course  I have reviewed the triage vital signs and the nursing notes.  Pertinent labs & imaging results that were available during my  care of the patient were reviewed by me and considered in my medical decision making (see chart for details).  Clinical Course  Value Comment By Time  DG Hand Complete Left (Reviewed) Fransico Meadow, PA-C 07/25 1648       Final Clinical Impressions(s) / ED Diagnoses   Final diagnoses:  Laceration of hand, left, initial encounter    New Prescriptions Discharge Medication List as of 03/25/2016  4:53 PM    START taking these medications   Details  sulfamethoxazole-trimethoprim (BACTRIM DS,SEPTRA DS) 800-160 MG tablet Take 1 tablet by mouth 2 (two) times daily., Starting Tue 03/25/2016, Until Tue 04/01/2016, Print       I personally performed the services in this documentation, which was scribed in my presence.  The recorded information has been reviewed and considered.   Ronnald Collum.   Hollace Kinnier [place], PA-C 03/25/16 [place], MD 03/28/16 1228

## 2016-04-01 ENCOUNTER — Other Ambulatory Visit: Payer: Self-pay | Admitting: Orthopedic Surgery

## 2016-04-01 DIAGNOSIS — M25512 Pain in left shoulder: Secondary | ICD-10-CM

## 2016-04-16 ENCOUNTER — Ambulatory Visit
Admission: RE | Admit: 2016-04-16 | Discharge: 2016-04-16 | Disposition: A | Discharge status: Home / Self Care | Payer: Commercial Managed Care - HMO | Source: Ambulatory Visit | Attending: Orthopedic Surgery | Admitting: Orthopedic Surgery

## 2016-04-16 DIAGNOSIS — M25512 Pain in left shoulder: Secondary | ICD-10-CM

## 2016-04-16 MED ORDER — IOPAMIDOL (ISOVUE-M 200) INJECTION 41%: 15.0000 mL | Freq: Once | INTRAMUSCULAR | Status: DC

## 2016-06-09 ENCOUNTER — Other Ambulatory Visit: Payer: Self-pay | Admitting: Neurological Surgery

## 2016-06-09 DIAGNOSIS — M542 Cervicalgia: Secondary | ICD-10-CM

## 2016-06-10 ENCOUNTER — Ambulatory Visit
Admission: RE | Admit: 2016-06-10 | Discharge: 2016-06-10 | Disposition: A | Discharge status: Home / Self Care | Payer: Commercial Managed Care - HMO | Source: Ambulatory Visit | Attending: Neurological Surgery | Admitting: Neurological Surgery

## 2016-06-10 DIAGNOSIS — M542 Cervicalgia: Secondary | ICD-10-CM

## 2016-09-29 ENCOUNTER — Other Ambulatory Visit: Payer: Self-pay | Admitting: Family

## 2016-11-23 IMAGING — MR MR SHOULDER*L* W/ CM
6 series · 40 of 40 positions shown · IV contrast (agent unspecified)
Comparison: None.

CLINICAL DATA: Left shoulder pain and weakness.

EXAM:
MR ARTHROGRAM OF THE LEFT SHOULDER
TECHNIQUE: Multiplanar, multisequence MR imaging of the left shoulder was
performed following the administration of intra-articular contrast.
CONTRAST:  See Injection Documentation.

[Series 3: T1 fat-sat · axial · 4.0mm · 0.23mm/px · z∈[-40,+42]mm · 6 of 20 slices shown (1 of 4)]
[im 1/20]
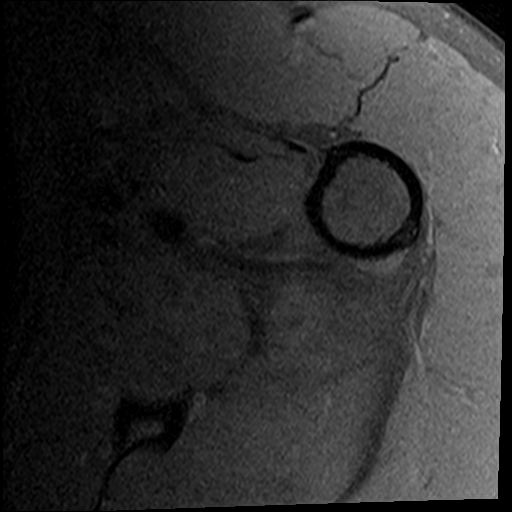
[im 4/20]
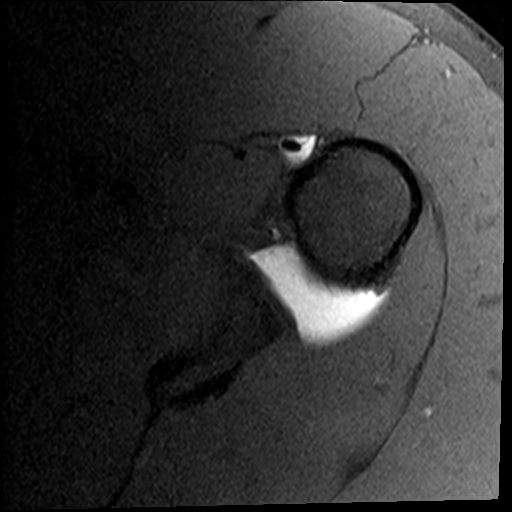
[im 8/20]
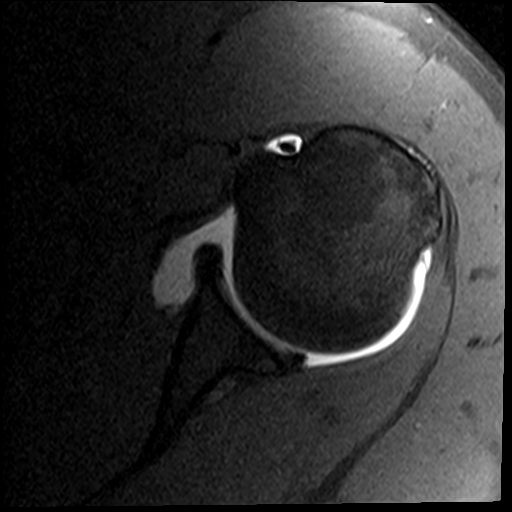
[im 12/20]
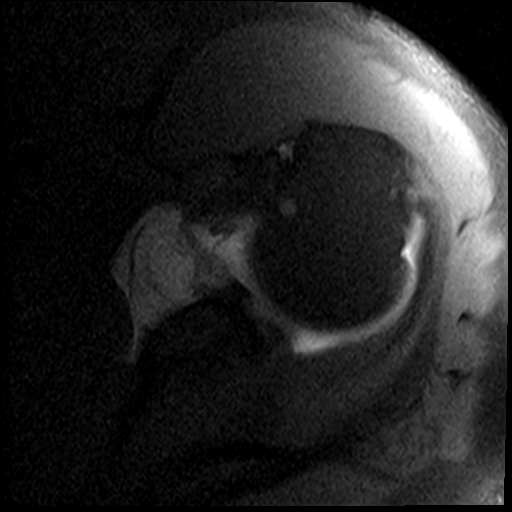
[im 16/20]
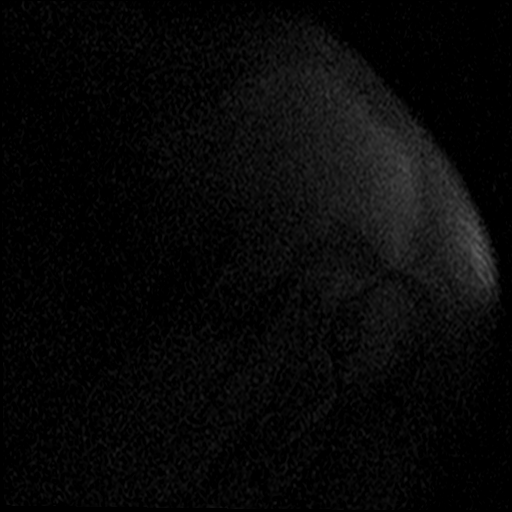
[im 20/20]
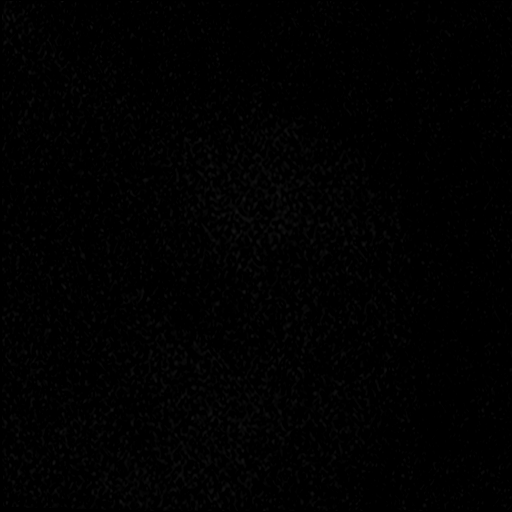

[Series 4: T2 fat-sat · oblique · 4.0mm · 0.55mm/px · 7 of 18 slices shown (1 of 2)]
[im 1/18]
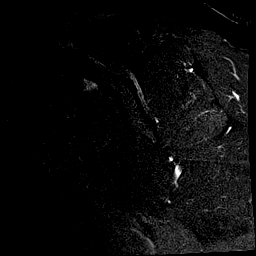
[im 3/18]
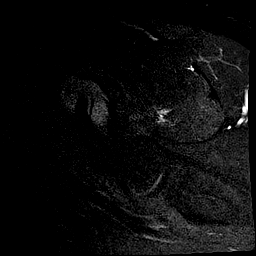
[im 6/18]
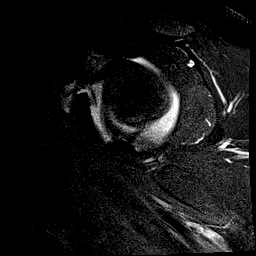
[im 9/18]
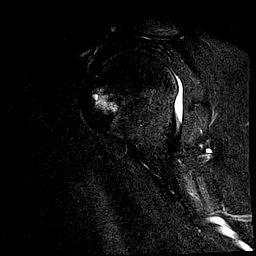
[im 12/18]
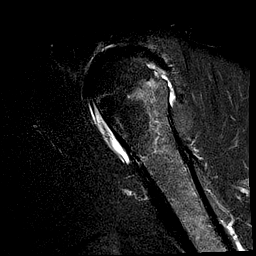
[im 15/18]
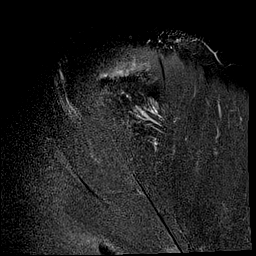
[im 18/18]
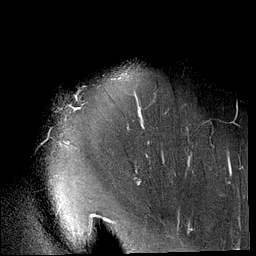

[Series 5: T1 fat-sat · oblique · 4.0mm · 0.44mm/px · 7 of 18 slices shown (2 of 4)]
[im 1/18]
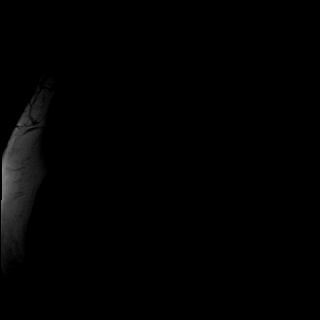
[im 3/18]
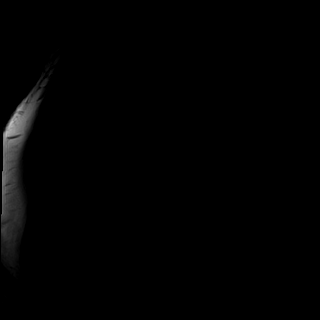
[im 6/18]
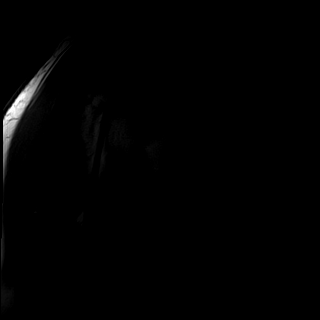
[im 9/18]
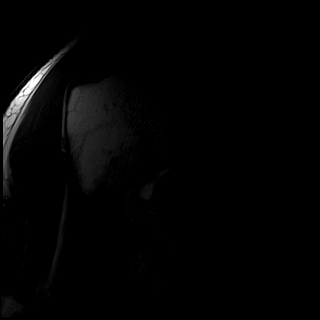
[im 12/18]
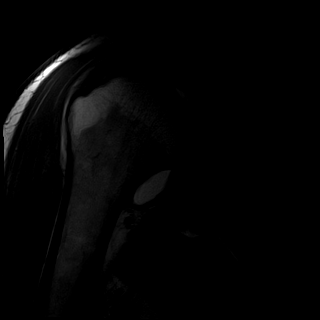
[im 15/18]
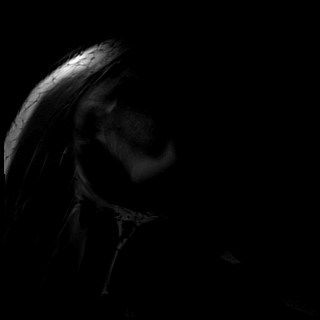
[im 18/18]
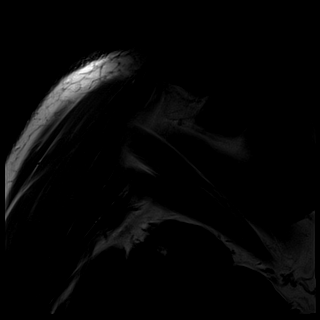

[Series 6: T1 fat-sat · oblique · 4.0mm · 0.55mm/px · 7 of 18 slices shown (3 of 4)]
[im 1/18]
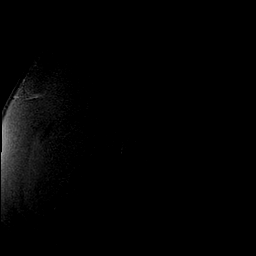
[im 3/18]
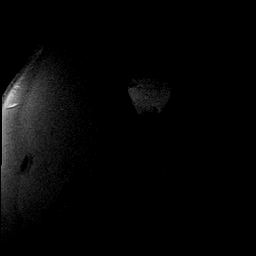
[im 6/18]
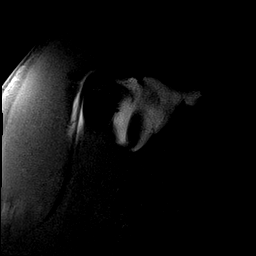
[im 9/18]
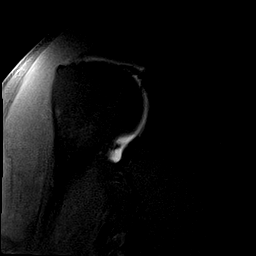
[im 12/18]
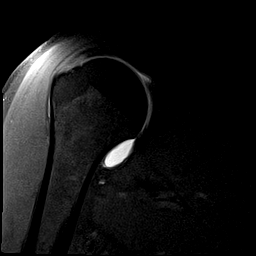
[im 15/18]
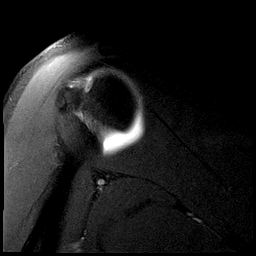
[im 18/18]
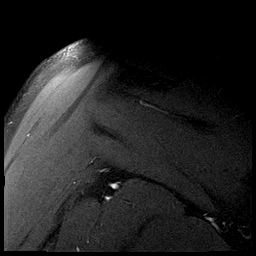

[Series 7: T2 fat-sat · oblique · 4.0mm · 0.55mm/px · 7 of 18 slices shown (2 of 2)]
[im 1/18]
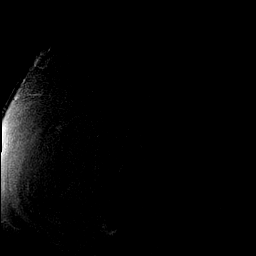
[im 3/18]
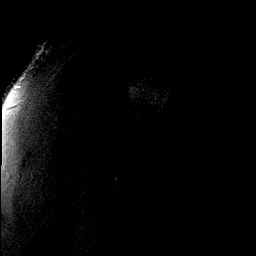
[im 6/18]
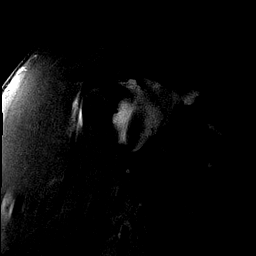
[im 9/18]
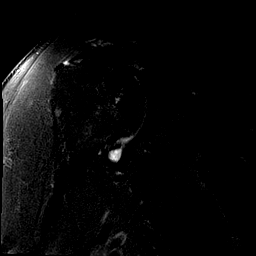
[im 12/18]
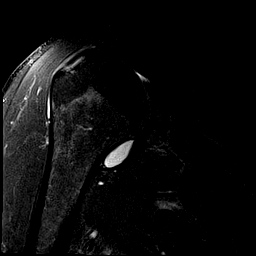
[im 15/18]
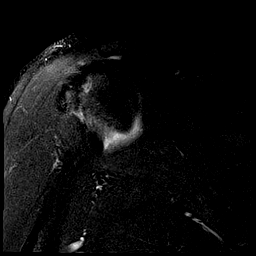
[im 18/18]
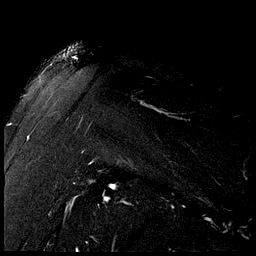

[Series 11: T1 fat-sat · sagittal · 4.0mm · 0.59mm/px · 6 of 16 slices shown (4 of 4)]
[im 1/16]
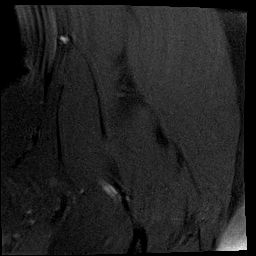
[im 4/16]
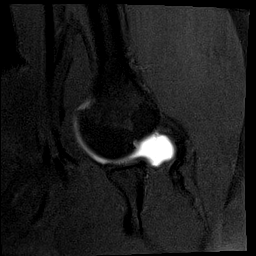
[im 7/16]
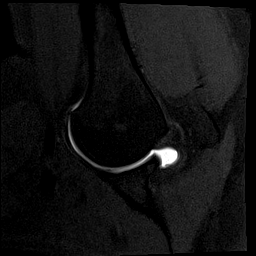
[im 10/16]
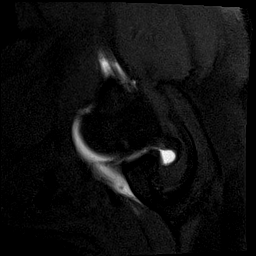
[im 13/16]
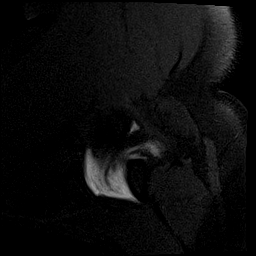
[im 16/16]
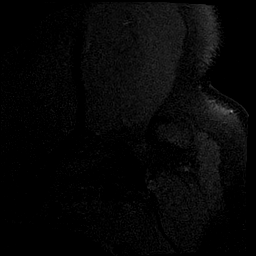

[40 of 40 positions shown; findings below may reference images not displayed]

FINDINGS: Rotator cuff: Intact.

Muscles: No atrophy or edema.

Biceps long head: Properly located and intact.

Acromioclavicular Joint: Normal.

Glenohumeral Joint: Normal.

Labrum: Normal.

Bones: Small cystic degenerative changes in the lesser tuberosity as
well as in the posterior lateral aspect of the humeral head.
IMPRESSION: Minimal cystic degenerative changes in the humeral head. Otherwise,
normal MR arthrogram of the left shoulder. Specifically, the labrum
is normal.

## 2016-12-09 ENCOUNTER — Ambulatory Visit (INDEPENDENT_AMBULATORY_CARE_PROVIDER_SITE_OTHER): Payer: Commercial Managed Care - HMO | Admitting: Family

## 2016-12-09 ENCOUNTER — Encounter: Payer: Self-pay | Admitting: Family

## 2016-12-09 VITALS — BP 144/80 | HR 101 | Temp 98.6°F | Resp 18 | Ht 68.0 in | Wt 287.0 lb

## 2016-12-09 DIAGNOSIS — J0140 Acute pansinusitis, unspecified: Secondary | ICD-10-CM | POA: Diagnosis not present

## 2016-12-09 DIAGNOSIS — J329 Chronic sinusitis, unspecified: Secondary | ICD-10-CM | POA: Insufficient documentation

## 2016-12-09 DIAGNOSIS — M7581 Other shoulder lesions, right shoulder: Secondary | ICD-10-CM | POA: Diagnosis not present

## 2016-12-09 MED ORDER — GABAPENTIN 300 MG PO CAPS: ORAL_CAPSULE | ORAL | 0 refills | Status: DC

## 2016-12-09 MED ORDER — AMOXICILLIN-POT CLAVULANATE 875-125 MG PO TABS: 1.0000 | ORAL_TABLET | Freq: Two times a day (BID) | ORAL | 0 refills | Status: DC

## 2016-12-09 NOTE — Assessment & Plan Note (Signed)
Symptoms and exam consistent with bacterial sinusitis. Start Augmentin. Continue over-the-counter medications as needed for symptom relief and supportive care. Follow-up if symptoms worsen or do not improve.

## 2016-12-09 NOTE — Patient Instructions (Signed)
Thank you for choosing [facility].  SUMMARY AND INSTRUCTIONS:  Ice x 20 minutes every 2 hours and after activity.  Rest your right shoulder for the next 24 hours.   Stretches and exercises daily.  For your sinuses - start Aleve D. If symptoms do not improve, start antibiotic.   Medication:  Your prescription(s) have been submitted to your pharmacy or been printed and provided for you. Please take as directed and contact our office if you believe you are having problem(s) with the medication(s) or have any questions.  Follow up:  If your symptoms worsen or fail to improve, please contact our office for further instruction, or in case of emergency go directly to the emergency room at the closest medical facility.     Rotator Cuff Tear Rehab After Surgery Ask your health care provider which exercises are safe for you. Do exercises exactly as told by your health care provider and adjust them as directed. It is normal to feel mild stretching, pulling, tightness, or discomfort as you do these exercises, but you should stop right away if you feel sudden pain or your pain gets worse. Do not begin these exercises until told by your health care provider. Stretching and range of motion exercises These exercises warm up your muscles and joints and improve the movement and flexibility of your shoulder. These exercises also help to relieve pain, numbness, and tingling. Exercise A: Pendulum   1. Stand near a wall or a surface that you can hold onto for balance. 2. Bend at the waist and let your left / right arm hang straight down. Use your other arm to keep your balance. 3. Relax your arm and shoulder muscles, and move your hips and your trunk so your left / right arm swings freely. Your arm should swing because of the motion of your body, not because you are using your arm or shoulder muscles. 4. Keep moving so your arm swings in the following directions, as told by your health care  provider:  Side to side.  Forward and backward.  In clockwise and counterclockwise circles. Repeat __________ times, or for __________ seconds per direction. Complete this exercise __________ times a day. Exercise B: Flexion, seated   1. Sit in a stable chair so your left / right forearm can rest on a flat surface. Your elbow should rest at a height that keeps your upper arm next to your body. 2. Keeping your shoulder relaxed, lean forward at the waist and let your hand slide forward. Stop when you feel a stretch in your shoulder, or when you reach the angle that is recommended by your health care provider. 3. Hold for __________ seconds. 4. Slowly return to the starting position. Repeat __________ times. Complete this exercise __________ times a day. Exercise C: Flexion, standing   1. Stand and hold a broomstick, a cane, or a similar object. Place your hands a little more than shoulder-width apart on the object. Your left / right hand should be palm-up, and your other hand should be palm-down. 2. Push the stick down with your healthy arm to raise your left / right arm in front of your body, and then over your head. Use your other hand to help move the stick. Stop when you feel a stretch in your shoulder, or when you reach the angle that is recommended by your health care provider.  Avoid shrugging your shoulder while you raise your arm. Keep your shoulder blade tucked down toward your spine.  Keep your left / right shoulder muscles relaxed. 3. Hold for __________ seconds. 4. Slowly return to the starting position. Repeat __________ times. Complete this exercise __________ times a day. Exercise D: Abduction, supine   1. Lie on your back and hold a broomstick, a cane, or a similar object. Place your hands a little more than shoulder-width apart on the object. Your left / right hand should be palm-up, and your other hand should be palm-down. 2. Push the stick to raise your left / right arm  out to your side and then over your head. Use your other hand to help move the stick. Stop when you feel a stretch in your shoulder, or when you reach the angle that is recommended by your health care provider.  Avoid shrugging your shoulder while you raise your arm. Keep your shoulder blade tucked down toward your spine. 3. Hold for __________ seconds. 4. Slowly return to the starting position. Repeat __________ times. Complete this exercise __________ times a day. Exercise E: Shoulder flexion, active-assisted   1. Lie on your back. You may bend your knees for comfort. 2. Hold a broomstick, a cane, or a similar object so your hands are about shoulder-width apart. Your palms should face toward your feet. 3. Raise your left / right arm over your head and behind your head, toward the floor. Use your other hand to help you do this. Stop when you feel a gentle stretch in your shoulder, or when you reach the angle that is recommended by your health care provider. 4. Hold for __________ seconds. 5. Use the broomstick and your other arm to help you return your left / right arm to the starting position. Repeat __________ times. Complete this exercise __________ times a day. Exercise F: External rotation   1. Sit in a stable chair without armrests, or stand. 2. Tuck a soft object, such as a folded towel or a small ball, under your left / right upper arm. 3. Hold a broomstick, a cane, or a similar object so your palms face down, toward the floor. Bend your elbows to an "L" shape (90 degrees), and keep your hands about shoulder-width apart. 4. Straighten your healthy arm and push the broomstick across your body, toward your left / right side. Keep your left / right arm bent. This will rotate your left / right forearm away from your body. 5. Hold for __________ seconds. 6. Slowly return to the starting position. Repeat __________ times. Complete this exercise __________ times a day. Strengthening  exercises These exercises build strength and endurance in your shoulder. Endurance is the ability to use your muscles for a long time, even after they get tired. Exercise G: Shoulder flexion, isometric   1. Stand or sit about 4-6 inches (10-15 cm) away from a wall with your left / right side facing the wall. 2. Gently make a fist and place your left / right hand on the wall so the top of your fist touches the wall. 3. With your left / right elbow straight, gently press the top of your fist into the wall. Gradually increase the pressure until you are pressing as hard as you can without shrugging your shoulder. 4. Hold for __________ seconds. 5. Slowly release the tension and relax your muscles completely before you repeat the exercise. Repeat __________ times. Complete this exercise __________ times a day. Exercise H: Shoulder abduction, isometric   1. Stand or sit about 4-6 inches (10-15 cm) away from a wall with your  right/left side facing the wall. 2. Bend your left / right elbow and gently press your elbow into the wall as if you are trying to move your arm out to your side. Increase the pressure gradually until you are pressing as hard as you can without shrugging your shoulder. 3. Hold for __________ seconds. 4. Slowly release the tension and relax your muscles completely before repeating the exercise. Repeat __________ times. Complete this exercise __________ times a day. Exercise I: Internal rotation, isometric   1. Stand or sit in a doorway, facing the door frame. 2. Bend your left / right elbow and place the palm of your hand against the door frame. Only your palm should be touching the frame. Keep your upper arm at your side. 3. Gently press your hand into the door frame, as if you are trying to push your arm toward your abdomen. Do not let your wrist bend.  Avoid shrugging your shoulder while you press your hand into the door frame. Keep your shoulder blade tucked down toward the  middle of your back. 4. Hold for __________ seconds. 5. Slowly release the tension, and relax your muscles completely before you repeat the exercise. Repeat __________ times. Complete this exercise __________ times a day. Exercise J: External rotation, isometric   1. Stand or sit in a doorway, facing the door frame. 2. Bend your left / right elbow and place the back of your wrist against the door frame. Only the back of your wrist should be touching the frame. Keep your upper arm at your side. 3. Gently press your wrist against the door frame, as if you are trying to push your arm away from your abdomen.  Avoid shrugging your shoulder while you press your wrist into the door frame. Keep your shoulder blade tucked down toward the middle of your back. 4. Hold for __________ seconds. 5. Slowly release the tension, and relax your muscles completely before you repeat the exercise. Repeat __________ times. Complete this exercise __________ times a day. This information is not intended to replace advice given to you by your health care provider. Make sure you discuss any questions you have with your health care provider. Document Released: 08/18/2005 Document Revised: 04/24/2016 Document Reviewed: 09/01/2015 Elsevier Interactive Patient Education  2017 [employer].

## 2016-12-09 NOTE — Assessment & Plan Note (Signed)
Symptoms and exam consistent with tendinopathy of the right shoulder. Corticosteroid injection provided without complication. Continue conservative treatment with ice and home exercise therapy. If symptoms worsen or do not improve consider physical therapy and possible MRI.

## 2016-12-09 NOTE — Progress Notes (Signed)
Subjective:    Patient ID: Joshua Parrish, male    DOB: 07-Dec-1975, 41 y.o.   MRN: [id number]  Chief Complaint  Patient presents with  . Shoulder Pain    having pain in right shoulder would like injection, sinus issues    HPI:  Joshua Parrish is a 41 y.o. male who  has a past medical history of Chicken pox; Depression; Fatty liver; GERD (gastroesophageal reflux disease); Migraines; Osteoarthritis of shoulder; Rotator cuff impingement syndrome of left shoulder (06/19/2015); and Sinusitis. and presents today for an office visit.   1.) Sinus issues - This is a new problem. Associated symptom of sinus pressure and pain that has been going on for about 2 weeks. No fevers. Severity is enough to that it has triggered his migraine headaches. Modifying factors include Zyrtec which has not helped, but has helped with Benedryl with Nyquil. Symptoms are generally worsening. No recent antibiotics.   2.) Shoulder - This is a new problem. Associated symptom of pain located in his right shoulder. Previously diagnosed with tendinitis and was given a cortisone injection which seemed to help. Last injection was November 0of 2017. No trauma or injury. Right hand dominant. There has been some weakness. No shoulder pain or numbness or tingling.    No Known Allergies    Outpatient Medications Prior to Visit  Medication Sig Dispense Refill  . oxyCODONE (OXY IR/ROXICODONE) 5 MG immediate release tablet Take 1-2 tablets (5-10 mg total) by mouth every 6 (six) hours as needed for moderate pain, severe pain or breakthrough pain. 40 tablet 0  . pantoprazole (PROTONIX) 40 MG tablet TAKE 1 TABLET BY MOUTH TWICE DAILY 180 tablet 0  . dicyclomine (BENTYL) 20 MG tablet Take 20 mg by mouth 2 (two) times daily as needed for spasms.     Marland Kitchen diltiazem 2 % GEL Apply 1 application topically 3 (three) times daily. X 6-8 weeks (Patient taking differently: Apply 1 application topically 3 (three) times daily as needed (For pain.). ) 30 g 0    . gabapentin (NEURONTIN) 300 MG capsule Take 300 mg by mouth 3 (three) times daily as needed (For pain.).    Marland Kitchen lidocaine (XYLOCAINE) 2 % solution Use as directed 15 mLs in the mouth or throat as needed for mouth pain. 100 mL 2  . OVER THE COUNTER MEDICATION Take 1 packet by mouth daily. Vitamin Supplement Packet    . predniSONE (DELTASONE) 20 MG tablet Take 60 mg daily x 2 days then 40 mg daily x 2 days then 20 mg daily x 2 days then stop 12 tablet 0  . sucralfate (CARAFATE) 1 g tablet Take 1 tablet (1 g total) by mouth 4 (four) times daily -  with meals and at bedtime. 120 tablet 0   No facility-administered medications prior to visit.       Past Surgical History:  Procedure Laterality Date  . APPENDECTOMY    . CERVICAL LAMINECTOMY     has plates and fusion  . CHOLECYSTECTOMY N/A 12/24/2015   Procedure: LAPAROSCOPIC CHOLECYSTECTOMY ;  Surgeon: [name] Pickerel, MD;  Location: [facility];  Service: General;  Laterality: N/A;  . CLAVICLE EXCISION Left   . HYPOSPADIAS CORRECTION     as child  . KNEE SURGERY Bilateral    multiple  . SHOULDER ARTHROSCOPY WITH ROTATOR CUFF REPAIR Left 06/26/2015   Procedure: SHOULDER ARTHROSCOPY WITH ROTATOR CUFF REPAIR;  Surgeon: Elsie Saas, MD;  Location: [facility];  Service: Orthopedics;  Laterality: Left;  Past Medical History:  Diagnosis Date  . Chicken pox   . Depression   . Fatty liver   . GERD (gastroesophageal reflux disease)   . Migraines   . Osteoarthritis of shoulder    left  . Rotator cuff impingement syndrome of left shoulder 06/19/2015  . Sinusitis       Review of Systems  Constitutional: Negative for chills and fever.  HENT: Positive for congestion, sinus pain and sinus pressure.   Respiratory: Positive for cough.   Cardiovascular: Negative for chest pain and leg swelling.  Musculoskeletal:       Positive for right shoulder pain.  Neurological: Positive for weakness and headaches.      Objective:    BP  (!) 144/80 (BP Location: Left Arm, Patient Position: Sitting, Cuff Size: Large)   Pulse (!) 101   Temp 98.6 F (37 C) (Oral)   Resp 18   Ht 5\' 8"  (1.727 m)   Wt 287 lb (130.2 kg)   SpO2 98%   BMI 43.64 kg/m  Nursing note and vital signs reviewed.  Physical Exam  Constitutional: He is oriented to person, place, and time. He appears well-developed and well-nourished. No distress.  HENT:  Right Ear: Hearing, tympanic membrane, external ear and ear canal normal.  Left Ear: Hearing, tympanic membrane, external ear and ear canal normal.  Nose: Right sinus exhibits maxillary sinus tenderness and frontal sinus tenderness. Left sinus exhibits maxillary sinus tenderness and frontal sinus tenderness.  Mouth/Throat: Uvula is midline, oropharynx is clear and moist and mucous membranes are normal.  Neck: Neck supple.  Cardiovascular: Normal rate, regular rhythm, normal heart sounds and intact distal pulses.   Pulmonary/Chest: Effort normal and breath sounds normal.  Musculoskeletal:  Right shoulder - no obvious deformity, discoloration, or edema. Palpable tenderness of subacromial space and subscapularis tendon. Range of motion within normal limits with discomfort noted greater than 120 of flexion and abduction. Strength is 4+. Distal pulses and sensation are intact and appropriate. Positive Neer's impingement and Michel Bickers. Positive empty can.  Neurological: He is alert and oriented to person, place, and time.  Skin: Skin is warm and dry.  Psychiatric: He has a normal mood and affect. His behavior is normal. Judgment and thought content normal.   Procedure: Corticosteroid injection of the right shoulder  Description: Informed consent was obtained with discussion including risks and benefits of the procedure. Patient verbally wished to continued. Time out was performed. The posterior lateral approach was identified and marked. It was cleansed with betadine using a concentric circular pattern.  Skin anesthesia was applied using cold spray applied for 10 seconds. Injection of 53ml :4 ml of Kenalog (40 mg / ml) to Sensoricaine was injected following aspiration with no return noted. Following the injection there was instant relief confirming appropriate placement. A bandage was applied to the area and post care instructions were provided. The procedure was tolerated well with no complications.       Assessment & Plan:   Problem List Items Addressed This Visit      Respiratory   Sinusitis    Symptoms and exam consistent with bacterial sinusitis. Start Augmentin. Continue over-the-counter medications as needed for symptom relief and supportive care. Follow-up if symptoms worsen or do not improve.      Relevant Medications   amoxicillin-clavulanate (AUGMENTIN) 875-125 MG tablet     Musculoskeletal and Integument   Tendinitis of right rotator cuff - Primary    Symptoms and exam consistent with tendinopathy of the  right shoulder. Corticosteroid injection provided without complication. Continue conservative treatment with ice and home exercise therapy. If symptoms worsen or do not improve consider physical therapy and possible MRI.          I have discontinued Mr. Stencel's sucralfate, dicyclomine, diltiazem, OVER THE COUNTER MEDICATION, predniSONE, and lidocaine. I have also changed his gabapentin. Additionally, I am having him start on amoxicillin-clavulanate. Lastly, I am having him maintain his oxyCODONE and pantoprazole.   Meds ordered this encounter  Medications  . amoxicillin-clavulanate (AUGMENTIN) 875-125 MG tablet    Sig: Take 1 tablet by mouth 2 (two) times daily.    Dispense:  14 tablet    Refill:  0    Order Specific Question:   Supervising Provider    Answer:   Pricilla Holm A [[id number]]  . gabapentin (NEURONTIN) 300 MG capsule    Sig: Take 1 tablet by mouth at night for 1 day, then 1 tablet twice daily for 1 day and then 1 tablet tid.    Dispense:  90 capsule     Refill:  0    Order Specific Question:   Supervising Provider    Answer:   Pricilla Holm A [[id number]]     Follow-up: Return in about 1 month (around 01/08/2017), or if symptoms worsen or fail to improve.  Mauricio Po, FNP

## 2016-12-25 ENCOUNTER — Emergency Department (HOSPITAL_COMMUNITY): Payer: Commercial Managed Care - HMO

## 2016-12-25 ENCOUNTER — Inpatient Hospital Stay (HOSPITAL_COMMUNITY): Payer: Commercial Managed Care - HMO

## 2016-12-25 ENCOUNTER — Encounter (HOSPITAL_COMMUNITY): Payer: Self-pay

## 2016-12-25 ENCOUNTER — Observation Stay (HOSPITAL_COMMUNITY)
Admission: EM | Admit: 2016-12-25 | Discharge: 2016-12-29 | Disposition: A | Discharge status: Home / Self Care | Payer: Commercial Managed Care - HMO | Attending: Family Medicine | Admitting: Family Medicine

## 2016-12-25 DIAGNOSIS — R000 Tachycardia, unspecified: Secondary | ICD-10-CM | POA: Diagnosis not present

## 2016-12-25 DIAGNOSIS — J329 Chronic sinusitis, unspecified: Secondary | ICD-10-CM | POA: Diagnosis not present

## 2016-12-25 DIAGNOSIS — A419 Sepsis, unspecified organism: Principal | ICD-10-CM | POA: Diagnosis present

## 2016-12-25 DIAGNOSIS — K219 Gastro-esophageal reflux disease without esophagitis: Secondary | ICD-10-CM

## 2016-12-25 DIAGNOSIS — N419 Inflammatory disease of prostate, unspecified: Secondary | ICD-10-CM

## 2016-12-25 DIAGNOSIS — K760 Fatty (change of) liver, not elsewhere classified: Secondary | ICD-10-CM | POA: Diagnosis not present

## 2016-12-25 DIAGNOSIS — R51 Headache: Secondary | ICD-10-CM | POA: Diagnosis not present

## 2016-12-25 DIAGNOSIS — N410 Acute prostatitis: Secondary | ICD-10-CM | POA: Insufficient documentation

## 2016-12-25 DIAGNOSIS — F17290 Nicotine dependence, other tobacco product, uncomplicated: Secondary | ICD-10-CM | POA: Insufficient documentation

## 2016-12-25 DIAGNOSIS — R109 Unspecified abdominal pain: Secondary | ICD-10-CM | POA: Diagnosis not present

## 2016-12-25 DIAGNOSIS — R197 Diarrhea, unspecified: Secondary | ICD-10-CM | POA: Diagnosis not present

## 2016-12-25 LAB — PROTIME-INR
INR: 1.06
PROTHROMBIN TIME: 13.8 s (ref 11.4–15.2)

## 2016-12-25 LAB — CBC WITH DIFFERENTIAL/PLATELET
BASOS PCT: 0 %
Basophils Absolute: 0 10*3/uL (ref 0.0–0.1)
EOS ABS: 0 10*3/uL (ref 0.0–0.7)
EOS PCT: 0 %
HCT: 42.1 % (ref 39.0–52.0)
HEMOGLOBIN: 14 g/dL (ref 13.0–17.0)
LYMPHS ABS: 2.2 10*3/uL (ref 0.7–4.0)
Lymphocytes Relative: 13 %
MCH: 29.4 pg (ref 26.0–34.0)
MCHC: 33.3 g/dL (ref 30.0–36.0)
MCV: 88.4 fL (ref 78.0–100.0)
Monocytes Absolute: 0.9 10*3/uL (ref 0.1–1.0)
Monocytes Relative: 5 %
NEUTROS PCT: 82 %
Neutro Abs: 13.6 10*3/uL — ABNORMAL HIGH (ref 1.7–7.7)
PLATELETS: 278 10*3/uL (ref 150–400)
RBC: 4.76 MIL/uL (ref 4.22–5.81)
RDW: 12.5 % (ref 11.5–15.5)
WBC: 16.7 10*3/uL — ABNORMAL HIGH (ref 4.0–10.5)

## 2016-12-25 LAB — LACTIC ACID, PLASMA
LACTIC ACID, VENOUS: 0.9 mmol/L (ref 0.5–1.9)
LACTIC ACID, VENOUS: 0.9 mmol/L (ref 0.5–1.9)

## 2016-12-25 LAB — BASIC METABOLIC PANEL
Anion gap: 6 (ref 5–15)
BUN: 9 mg/dL (ref 6–20)
CO2: 25 mmol/L (ref 22–32)
CREATININE: 0.92 mg/dL (ref 0.61–1.24)
Calcium: 9.6 mg/dL (ref 8.9–10.3)
Chloride: 103 mmol/L (ref 101–111)
GFR calc Af Amer: >60 mL/min (ref >60)
GFR calc non Af Amer: >60 mL/min (ref >60)
GLUCOSE: 110 mg/dL — AB (ref 65–99)
Potassium: 4.2 mmol/L (ref 3.5–5.1)
Sodium: 134 mmol/L — ABNORMAL LOW (ref 135–145)

## 2016-12-25 LAB — URINALYSIS, ROUTINE W REFLEX MICROSCOPIC
BACTERIA UA: NONE SEEN
BILIRUBIN URINE: NEGATIVE
GLUCOSE, UA: NEGATIVE mg/dL
Ketones, ur: NEGATIVE mg/dL
NITRITE: NEGATIVE
PH: 7 (ref 5.0–8.0)
Protein, ur: 100 mg/dL — AB
SPECIFIC GRAVITY, URINE: 1.011 (ref 1.005–1.030)
Squamous Epithelial / LPF: NONE SEEN

## 2016-12-25 LAB — CBC
HCT: 44.6 % (ref 39.0–52.0)
Hemoglobin: 15.1 g/dL (ref 13.0–17.0)
MCH: 29.7 pg (ref 26.0–34.0)
MCHC: 33.9 g/dL (ref 30.0–36.0)
MCV: 87.8 fL (ref 78.0–100.0)
PLATELETS: 329 10*3/uL (ref 150–400)
RBC: 5.08 MIL/uL (ref 4.22–5.81)
RDW: 12.7 % (ref 11.5–15.5)
WBC: 18.6 10*3/uL — ABNORMAL HIGH (ref 4.0–10.5)

## 2016-12-25 LAB — I-STAT CG4 LACTIC ACID, ED
LACTIC ACID, VENOUS: 1.28 mmol/L (ref 0.5–1.9)
LACTIC ACID, VENOUS: 0.86 mmol/L (ref 0.5–1.9)

## 2016-12-25 LAB — APTT: aPTT: 31 seconds (ref 24–36)

## 2016-12-25 LAB — PROCALCITONIN: Procalcitonin: <0.1 ng/mL

## 2016-12-25 MED ORDER — ACETAMINOPHEN 650 MG RE SUPP: 650.0000 mg | Freq: Four times a day (QID) | RECTAL | Status: DC | PRN

## 2016-12-25 MED ORDER — CEFTRIAXONE SODIUM 2 G IJ SOLR
2.0000 g | Freq: Once | INTRAMUSCULAR | Status: AC
  Administered 2016-12-25: 2 g via INTRAVENOUS
  Filled 2016-12-25: qty 2

## 2016-12-25 MED ORDER — ONDANSETRON HCL 4 MG PO TABS: 4.0000 mg | ORAL_TABLET | Freq: Four times a day (QID) | ORAL | Status: DC | PRN

## 2016-12-25 MED ORDER — DEXTROSE 5 % IV SOLN
1.0000 g | INTRAVENOUS | Status: DC
  Administered 2016-12-26 – 2016-12-29 (×5): 1 g via INTRAVENOUS
  Filled 2016-12-25 (×4): qty 10

## 2016-12-25 MED ORDER — HYDROMORPHONE HCL 1 MG/ML IJ SOLN
1.0000 mg | INTRAMUSCULAR | Status: DC | PRN
  Administered 2016-12-25 – 2016-12-27 (×3): 1 mg via INTRAVENOUS
  Filled 2016-12-25 (×3): qty 1

## 2016-12-25 MED ORDER — FENTANYL CITRATE (PF) 100 MCG/2ML IJ SOLN
100.0000 ug | Freq: Once | INTRAMUSCULAR | Status: AC
  Administered 2016-12-25: 100 ug via INTRAVENOUS
  Filled 2016-12-25: qty 2

## 2016-12-25 MED ORDER — ACETAMINOPHEN 325 MG PO TABS
650.0000 mg | ORAL_TABLET | Freq: Four times a day (QID) | ORAL | Status: DC | PRN
  Administered 2016-12-26: 650 mg via ORAL
  Filled 2016-12-25: qty 2

## 2016-12-25 MED ORDER — MORPHINE SULFATE (PF) 4 MG/ML IV SOLN
4.0000 mg | Freq: Once | INTRAVENOUS | Status: AC
  Administered 2016-12-25: 4 mg via INTRAVENOUS
  Filled 2016-12-25: qty 1

## 2016-12-25 MED ORDER — HYDROCODONE-ACETAMINOPHEN 5-325 MG PO TABS
1.0000 | ORAL_TABLET | ORAL | Status: DC | PRN
  Administered 2016-12-25: 1 via ORAL
  Administered 2016-12-26 – 2016-12-27 (×4): 2 via ORAL
  Administered 2016-12-29: 1 via ORAL
  Filled 2016-12-25 (×3): qty 2
  Filled 2016-12-25: qty 1
  Filled 2016-12-25: qty 2
  Filled 2016-12-25: qty 1

## 2016-12-25 MED ORDER — SODIUM CHLORIDE 0.9 % IV BOLUS (SEPSIS)
1000.0000 mL | Freq: Once | INTRAVENOUS | Status: AC
  Administered 2016-12-25: 1000 mL via INTRAVENOUS

## 2016-12-25 MED ORDER — BISACODYL 10 MG RE SUPP: 10.0000 mg | Freq: Every day | RECTAL | Status: DC | PRN

## 2016-12-25 MED ORDER — GABAPENTIN 300 MG PO CAPS
300.0000 mg | ORAL_CAPSULE | Freq: Three times a day (TID) | ORAL | Status: DC
  Administered 2016-12-25 – 2016-12-29 (×12): 300 mg via ORAL
  Filled 2016-12-25 (×12): qty 1

## 2016-12-25 MED ORDER — SENNOSIDES-DOCUSATE SODIUM 8.6-50 MG PO TABS
1.0000 | ORAL_TABLET | Freq: Every evening | ORAL | Status: DC | PRN
  Filled 2016-12-25: qty 1

## 2016-12-25 MED ORDER — MELOXICAM 7.5 MG PO TABS
15.0000 mg | ORAL_TABLET | Freq: Every day | ORAL | Status: DC
  Administered 2016-12-25 – 2016-12-29 (×5): 15 mg via ORAL
  Filled 2016-12-25 (×4): qty 2
  Filled 2016-12-25: qty 1

## 2016-12-25 MED ORDER — HYDROMORPHONE HCL 1 MG/ML IJ SOLN
1.0000 mg | Freq: Once | INTRAMUSCULAR | Status: AC
  Administered 2016-12-25: 1 mg via INTRAVENOUS
  Filled 2016-12-25: qty 1

## 2016-12-25 MED ORDER — IOPAMIDOL (ISOVUE-300) INJECTION 61%
INTRAVENOUS | Status: AC
  Administered 2016-12-25: 100 mL
  Filled 2016-12-25: qty 100

## 2016-12-25 MED ORDER — HYDROMORPHONE HCL 1 MG/ML IJ SOLN: 1.0000 mg | Freq: Once | INTRAMUSCULAR | Status: DC

## 2016-12-25 MED ORDER — ASPIRIN 325 MG PO TABS
650.0000 mg | ORAL_TABLET | Freq: Once | ORAL | Status: AC
  Administered 2016-12-25: 650 mg via ORAL
  Filled 2016-12-25: qty 2

## 2016-12-25 MED ORDER — SODIUM CHLORIDE 0.9 % IV SOLN
INTRAVENOUS | Status: DC
  Administered 2016-12-25 – 2016-12-28 (×9): via INTRAVENOUS
  Administered 2016-12-29: 1 mL via INTRAVENOUS

## 2016-12-25 MED ORDER — ONDANSETRON HCL 4 MG/2ML IJ SOLN: 4.0000 mg | Freq: Four times a day (QID) | INTRAMUSCULAR | Status: DC | PRN

## 2016-12-25 MED ORDER — PANTOPRAZOLE SODIUM 40 MG PO TBEC
40.0000 mg | DELAYED_RELEASE_TABLET | Freq: Two times a day (BID) | ORAL | Status: DC
  Administered 2016-12-25 – 2016-12-29 (×8): 40 mg via ORAL
  Filled 2016-12-25 (×8): qty 1

## 2016-12-25 NOTE — ED Provider Notes (Signed)
[facility] DEPT Provider Note   CSN: [id number] Arrival date & time: 12/25/16  1135     History   Chief Complaint Chief Complaint  Patient presents with  . Flank Pain    HPI NOBEL BRAR is a 41 y.o. male.  HPI Patient presents with fevers and bilateral flank pain. Reportedly has had some dysuria and urgency over the last week. Initial heart rate 150. History of being septic from prostatitis. States he feels as if he has to go and then will just have a small urination. States that he wakes up at night to have to go. No penile discharge. Previous history of hypospadias post repair. No cough. Pain is in his flank and lower abdomen.   Past Medical History:  Diagnosis Date  . Chicken pox   . Depression   . Fatty liver   . GERD (gastroesophageal reflux disease)   . Migraines   . Osteoarthritis of shoulder    left  . Rotator cuff impingement syndrome of left shoulder 06/19/2015  . Sinusitis     Patient Active Problem List   Diagnosis Date Noted  . Sinusitis 12/09/2016  . Tendinitis of right rotator cuff 12/09/2016  . Coughing 01/25/2016  . AP (abdominal pain) 11/29/2015  . Nausea without vomiting 11/29/2015  . Diarrhea 11/29/2015  . Anal or rectal pain 11/29/2015  . Mass in neck 11/27/2015  . RUQ abdominal pain 11/27/2015  . Rotator cuff impingement syndrome of left shoulder 06/19/2015  . Acromioclavicular joint arthritis 06/19/2015    Past Surgical History:  Procedure Laterality Date  . APPENDECTOMY    . CERVICAL LAMINECTOMY     has plates and fusion  . CHOLECYSTECTOMY N/A 12/24/2015   Procedure: LAPAROSCOPIC CHOLECYSTECTOMY ;  Surgeon: [name] Pickerel, MD;  Location: [facility];  Service: General;  Laterality: N/A;  . CLAVICLE EXCISION Left   . HYPOSPADIAS CORRECTION     as child  . KNEE SURGERY Bilateral    multiple  . SHOULDER ARTHROSCOPY WITH ROTATOR CUFF REPAIR Left 06/26/2015   Procedure: SHOULDER ARTHROSCOPY WITH ROTATOR CUFF REPAIR;  Surgeon: Elsie Saas,  MD;  Location: [facility];  Service: Orthopedics;  Laterality: Left;       Home Medications    Prior to Admission medications   Medication Sig Start Date End Date Taking? Authorizing Provider  amoxicillin-clavulanate (AUGMENTIN) 875-125 MG tablet Take 1 tablet by mouth 2 (two) times daily. 12/09/16   Golden Circle, FNP  gabapentin (NEURONTIN) 300 MG capsule Take 1 tablet by mouth at night for 1 day, then 1 tablet twice daily for 1 day and then 1 tablet tid. 12/09/16   Golden Circle, FNP  oxyCODONE (OXY IR/ROXICODONE) 5 MG immediate release tablet Take 1-2 tablets (5-10 mg total) by mouth every 6 (six) hours as needed for moderate pain, severe pain or breakthrough pain. 12/24/15   [name] Pickerel, MD  pantoprazole (PROTONIX) 40 MG tablet TAKE 1 TABLET BY MOUTH TWICE DAILY 09/29/16   Golden Circle, FNP    Family History Family History  Problem Relation Age of Onset  . Arthritis Mother   . Ovarian cancer Mother   . Alcohol abuse Father   . Breast cancer Maternal Grandmother   . Cancer Maternal Grandmother     spinal surgery  . Breast cancer Maternal Grandfather   . Cancer Other   . Hypertension Other   . Diabetes Other   . Colon cancer Neg Hx     Social History Social History  Substance Use Topics  . Smoking status: Never Smoker  . Smokeless tobacco: Current User    Types: Chew     Comment: form given 11/29/15  . Alcohol use 0.0 oz/week     Comment: occasional     Allergies   Patient has no known allergies.   Review of Systems Review of Systems  Constitutional: Positive for appetite change and fever.  HENT: Negative for congestion.   Respiratory: Negative for chest tightness.   Cardiovascular: Negative for chest pain.  Gastrointestinal: Positive for abdominal pain.  Genitourinary: Positive for difficulty urinating, dysuria, flank pain, frequency and urgency. Negative for discharge, genital sores and penile pain.  Musculoskeletal: Negative for back  pain.  Skin: Negative for wound.  Neurological: Negative for tremors.  Hematological: Negative for adenopathy.  Psychiatric/Behavioral: Negative for confusion.     Physical Exam Updated Vital Signs BP (!) 143/89   Pulse (!) 132   Temp 100.2 F (37.9 C) (Oral)   Resp (!) 27   Ht 5\' 9"  (1.753 m)   Wt 280 lb (127 kg)   SpO2 96%   BMI 41.35 kg/m   Physical Exam  Constitutional: He appears well-developed.  HENT:  Head: Normocephalic.  Eyes: EOM are normal.  Cardiovascular:  Tachycardia  Pulmonary/Chest: Effort normal.  Abdominal: Soft.  Mild suprapubic tenderness without rebound or guarding.  Genitourinary:  Genitourinary Comments: Suprapubic and CVA tenderness bilaterally. Previously repaired hypospadias. Tender in peroneal area.  Musculoskeletal: Normal range of motion.  Neurological: He is alert.     ED Treatments / Results  Labs (all labs ordered are listed, but only abnormal results are displayed) Labs Reviewed  URINALYSIS, ROUTINE W REFLEX MICROSCOPIC - Abnormal; Notable for the following:       Result Value   Color, Urine STRAW (*)    Hgb urine dipstick SMALL (*)    Protein, ur 100 (*)    Leukocytes, UA SMALL (*)    All other components within normal limits  CBC - Abnormal; Notable for the following:    WBC 18.6 (*)    All other components within normal limits  BASIC METABOLIC PANEL - Abnormal; Notable for the following:    Sodium 134 (*)    Glucose, Bld 110 (*)    All other components within normal limits  CULTURE, BLOOD (ROUTINE X 2)  CULTURE, BLOOD (ROUTINE X 2)  I-STAT CG4 LACTIC ACID, ED  I-STAT CG4 LACTIC ACID, ED    EKG  EKG Interpretation None       Radiology Ct Abdomen Pelvis W Contrast  Result Date: 12/25/2016 CLINICAL DATA:  Fever, nausea, diarrhea, and abdominal pain for 6 days. EXAM: CT ABDOMEN AND PELVIS WITH CONTRAST TECHNIQUE: Multidetector CT imaging of the abdomen and pelvis was performed using the standard protocol following  bolus administration of intravenous contrast. CONTRAST:  123mL ISOVUE-300 IOPAMIDOL (ISOVUE-300) INJECTION 61% COMPARISON:  09/05/2015 FINDINGS: Lower chest: Minimal dependent atelectasis in the right lung base. No pleural effusion. Hepatobiliary: Hepatic steatosis. Prior cholecystectomy. No biliary dilatation. Pancreas: Unremarkable. Spleen: Unremarkable. Adrenals/Urinary Tract: Unremarkable adrenal glands. No evidence of renal mass, calculi, or hydronephrosis. Unremarkable bladder. Stomach/Bowel: The stomach is within normal limits. No bowel dilatation or wall thickening. Prior appendectomy. Vascular/Lymphatic: No significant vascular findings. No enlarged lymph nodes. Reproductive: Unremarkable prostate. Other: No intraperitoneal free fluid. No abdominal wall mass or hernia. Musculoskeletal: Mild disc degeneration at L2-3 with mild disc space narrowing, vacuum disc, mild disc bulging, and spurring. IMPRESSION: No acute abnormality identified in the abdomen or  pelvis. Electronically Signed   By: Logan Bores M.D.   On: 12/25/2016 15:19    Procedures Procedures (including critical care time)  Medications Ordered in ED Medications  sodium chloride 0.9 % bolus 1,000 mL (1,000 mLs Intravenous New Bag/Given 12/25/16 1444)  sodium chloride 0.9 % bolus 1,000 mL (0 mLs Intravenous Stopped 12/25/16 1429)  sodium chloride 0.9 % bolus 1,000 mL (0 mLs Intravenous Stopped 12/25/16 1530)  fentaNYL (SUBLIMAZE) injection 100 mcg (100 mcg Intravenous Given 12/25/16 1323)  cefTRIAXone (ROCEPHIN) 2 g in dextrose 5 % 50 mL IVPB (0 g Intravenous Stopped 12/25/16 1530)  morphine 4 MG/ML injection 4 mg (4 mg Intravenous Given 12/25/16 1440)  iopamidol (ISOVUE-300) 61 % injection (100 mLs  Contrast Given 12/25/16 1501)     Initial Impression / Assessment and Plan / ED Course  I have reviewed the triage vital signs and the nursing notes.  Pertinent labs & imaging results that were available during my care of the patient were  reviewed by me and considered in my medical decision making (see chart for details).     Patient with dysuria and frequency. Has had fevers and is tachycardic. Normal lactic acid however I think is likely a rather severe prostatitis. He has had it before and has been septic with it. Started on Rocephin. IV fluids given. He has not been hypotensive. Will admit to internal medicine.  Final Clinical Impressions(s) / ED Diagnoses   Final diagnoses:  Acute prostatitis    New Prescriptions New Prescriptions   No medications on file     Davonna Belling, MD 12/25/16 1540

## 2016-12-25 NOTE — H&P (Signed)
History and Physical    Joshua Parrish GLO:[id number] DOB: July 04, 1976 DOA: 12/25/2016   PCP: Mauricio Po, FNP   Patient coming from:  Home    Chief Complaint: Suprapubic pain   HPI: Joshua Parrish is a 41 y.o. male  with prior history of prostatitis in 1884 complicated with sepsis, hypospadias status post surgery,  history of Gerd, migraines, depression, presenting with bilateral flank pain, dysuria and urgency over the last week, which prompted him to be seen at the ED  He denies any penile discharge or scrotal swelling but he reports scrotal pain. Sexually active in monogamous relationship. Denies any shortness of breath, recovering from recent sinusitis still symptomatic with some nasal drainage and upper maxillary tenderness . Denies CP or palpitations  Denies any appetite changes. No nausea or vomiting. No diarrhea but some hemorrhoidal pain. No recent changes in his diet. He denies any significant amount of alcohol. He denies any leg swelling. No recent long distance trips. Denies any fevers, chills or night sweats.The patient is receiving Rocephin  IV, and IV fluids, 2 L total  No other complaints are reported.   ED Course:  BP 107/62   Pulse (!) 120   Temp (!) 101.4 F (38.6 C) (Rectal)   Resp (!) 23   Ht 5\' 9"  (1.753 m)   Wt 127 kg (280 lb)   SpO2 96%   BMI 41.35 kg/m    lactic acid 0.86 urinalysis remarkable for small hemoglobin, small leukocytes WBC 18  blood culture pending CT of the abdomen and pelvis without acute abnormality. No intraperitoneal free fluid or abdominal masses EKG ST   Review of Systems: As per HPI otherwise 10 point review of systems negative.   Past Medical History:  Diagnosis Date  . Chicken pox   . Depression   . Fatty liver   . GERD (gastroesophageal reflux disease)   . Migraines   . Osteoarthritis of shoulder    left  . Rotator cuff impingement syndrome of left shoulder 06/19/2015  . Sinusitis     Past Surgical History:  Procedure  Laterality Date  . APPENDECTOMY    . CERVICAL LAMINECTOMY     has plates and fusion  . CHOLECYSTECTOMY N/A 12/24/2015   Procedure: LAPAROSCOPIC CHOLECYSTECTOMY ;  Surgeon: [name] Pickerel, MD;  Location: [facility];  Service: General;  Laterality: N/A;  . CLAVICLE EXCISION Left   . HYPOSPADIAS CORRECTION     as child  . KNEE SURGERY Bilateral    multiple  . SHOULDER ARTHROSCOPY WITH ROTATOR CUFF REPAIR Left 06/26/2015   Procedure: SHOULDER ARTHROSCOPY WITH ROTATOR CUFF REPAIR;  Surgeon: Elsie Saas, MD;  Location: [facility];  Service: Orthopedics;  Laterality: Left;    Social History Social History   Social History  . Marital status: Married    Spouse name: N/A  . Number of children: 1  . Years of education: 26   Occupational History  . Garage Interior and spatial designer     Social History Main Topics  . Smoking status: Never Smoker  . Smokeless tobacco: Current User    Types: Chew     Comment: form given 11/29/15  . Alcohol use 0.0 oz/week     Comment: occasional  . Drug use: No  . Sexual activity: Yes   Other Topics Concern  . Not on file   Social History Narrative   Fun: Lift weights, hike     No Known Allergies  Family History  Problem Relation Age of Onset  .  Arthritis Mother   . Ovarian cancer Mother   . Alcohol abuse Father   . Breast cancer Maternal Grandmother   . Cancer Maternal Grandmother     spinal surgery  . Breast cancer Maternal Grandfather   . Cancer Other   . Hypertension Other   . Diabetes Other   . Colon cancer Neg Hx     Prior to Admission medications   Medication Sig Start Date End Date Taking? Authorizing Provider  amoxicillin-clavulanate (AUGMENTIN) 875-125 MG tablet Take 1 tablet by mouth 2 (two) times daily. 12/09/16   Golden Circle, FNP  gabapentin (NEURONTIN) 300 MG capsule Take 1 tablet by mouth at night for 1 day, then 1 tablet twice daily for 1 day and then 1 tablet tid. 12/09/16   Golden Circle, FNP  oxyCODONE (OXY  IR/ROXICODONE) 5 MG immediate release tablet Take 1-2 tablets (5-10 mg total) by mouth every 6 (six) hours as needed for moderate pain, severe pain or breakthrough pain. 12/24/15   [name] Pickerel, MD  pantoprazole (PROTONIX) 40 MG tablet TAKE 1 TABLET BY MOUTH TWICE DAILY 09/29/16   Golden Circle, FNP    Physical Exam:  Vitals:   12/25/16 1415 12/25/16 1545 12/25/16 1600 12/25/16 1642  BP: (!) 143/89 112/61 107/62   Pulse: (!) 132 (!) 124 (!) 120   Resp: (!) 27 (!) 25 (!) 23   Temp:    (!) 101.4 F (38.6 C)  TempSrc:    Rectal  SpO2: 96% 96% 96%   Weight:      Height:       Constitutional: NAD, uncomfortable due to pain  Eyes: PERRL, lids and conjunctivae normal ENMT: Mucous membranes are moist, without exudate or lesions . tender to palpation at the upper sinus and maxillary areas  Neck: normal, supple, no masses, no thyromegaly Respiratory: clear to auscultation bilaterally, no wheezing, no crackles. Normal respiratory effort  Cardiovascular:  Tachy  rate and rhythm, no murmurs, rubs or gallops. No extremity edema. 2+ pedal pulses. No carotid bruits.  Abdomen:  Obese, tender to palp in the lower back and suprapubic region No hepatosplenomegaly. Bowel sounds positive.  Musculoskeletal: no clubbing / cyanosis. Moves all extremities Skin: no jaundice, No lesions.  Neurologic: Sensation intact  Strength equal in all extremities Psychiatric:   Alert and oriented x 3 anxious  mood.     Labs on Admission: I have personally reviewed following labs and imaging studies  CBC:  Recent Labs Lab 12/25/16 1141  WBC 18.6*  HGB 15.1  HCT 44.6  MCV 87.8  PLT 741    Basic Metabolic Panel:  Recent Labs Lab 12/25/16 1141  NA 134*  K 4.2  CL 103  CO2 25  GLUCOSE 110*  BUN 9  CREATININE 0.92  CALCIUM 9.6    GFR: Estimated Creatinine Clearance: 139.3 mL/min (by C-G formula based on SCr of 0.92 mg/dL).  Liver Function Tests: No results for input(s): AST, ALT, ALKPHOS, BILITOT,  PROT, ALBUMIN in the last 168 hours. No results for input(s): LIPASE, AMYLASE in the last 168 hours. No results for input(s): AMMONIA in the last 168 hours.  Coagulation Profile: No results for input(s): INR, PROTIME in the last 168 hours.  Cardiac Enzymes: No results for input(s): CKTOTAL, CKMB, CKMBINDEX, TROPONINI in the last 168 hours.  BNP (last 3 results) No results for input(s): PROBNP in the last 8760 hours.  HbA1C: No results for input(s): HGBA1C in the last 72 hours.  CBG: No results for input(s):  GLUCAP in the last 168 hours.  Lipid Profile: No results for input(s): CHOL, HDL, LDLCALC, TRIG, CHOLHDL, LDLDIRECT in the last 72 hours.  Thyroid Function Tests: No results for input(s): TSH, T4TOTAL, FREET4, T3FREE, THYROIDAB in the last 72 hours.  Anemia Panel: No results for input(s): VITAMINB12, FOLATE, FERRITIN, TIBC, IRON, RETICCTPCT in the last 72 hours.  Urine analysis:    Component Value Date/Time   COLORURINE STRAW (A) 12/25/2016 1226   APPEARANCEUR CLEAR 12/25/2016 1226   LABSPEC 1.011 12/25/2016 1226   PHURINE 7.0 12/25/2016 1226   GLUCOSEU NEGATIVE 12/25/2016 1226   GLUCOSEU NEGATIVE 11/29/2015 1430   HGBUR SMALL (A) 12/25/2016 1226   BILIRUBINUR NEGATIVE 12/25/2016 1226   KETONESUR NEGATIVE 12/25/2016 1226   PROTEINUR 100 (A) 12/25/2016 1226   UROBILINOGEN 0.2 11/29/2015 1430   NITRITE NEGATIVE 12/25/2016 1226   LEUKOCYTESUR SMALL (A) 12/25/2016 1226    Sepsis Labs: @LABRCNTIP (procalcitonin:4,lacticidven:4) )No results found for this or any previous visit (from the past 240 hour(s)).   Radiological Exams on Admission: Ct Abdomen Pelvis W Contrast  Result Date: 12/25/2016 CLINICAL DATA:  Fever, nausea, diarrhea, and abdominal pain for 6 days. EXAM: CT ABDOMEN AND PELVIS WITH CONTRAST TECHNIQUE: Multidetector CT imaging of the abdomen and pelvis was performed using the standard protocol following bolus administration of intravenous contrast.  CONTRAST:  12mL ISOVUE-300 IOPAMIDOL (ISOVUE-300) INJECTION 61% COMPARISON:  09/05/2015 FINDINGS: Lower chest: Minimal dependent atelectasis in the right lung base. No pleural effusion. Hepatobiliary: Hepatic steatosis. Prior cholecystectomy. No biliary dilatation. Pancreas: Unremarkable. Spleen: Unremarkable. Adrenals/Urinary Tract: Unremarkable adrenal glands. No evidence of renal mass, calculi, or hydronephrosis. Unremarkable bladder. Stomach/Bowel: The stomach is within normal limits. No bowel dilatation or wall thickening. Prior appendectomy. Vascular/Lymphatic: No significant vascular findings. No enlarged lymph nodes. Reproductive: Unremarkable prostate. Other: No intraperitoneal free fluid. No abdominal wall mass or hernia. Musculoskeletal: Mild disc degeneration at L2-3 with mild disc space narrowing, vacuum disc, mild disc bulging, and spurring. IMPRESSION: No acute abnormality identified in the abdomen or pelvis. Electronically Signed   By: Logan Bores M.D.   On: 12/25/2016 15:19    EKG: Independently reviewed.  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  Antibiotics delivered in the ED with Rocephin IV  And 3 L IVF . UA + nitrites Initial Lactic acid 0.86 . T max 100.2    CT  abdomen and pelvis without acute abnormality. No intraperitoneal free fluid or abdominal masses.  Admit to SDU Sepsis order set  IV antibiotics by pharmacy with IV Rocephin  Follow lactic acid q 6 hrs Follow blood and urine cultures IV fluids at 100 cc/h.  Procalcitonin order set  Follow CBC in am  Pain meds IV and oral for comfort   SInusisits, symptomatic. Recently treated with oral antibiotic (Augmentin)  as OP. History of laryngeal reflux  Check  maxillofacial CT without contrast   GERD, no acute symptoms Continue PPI   DVT prophylaxis: SCD's  In view of risk for hematuria   Code Status:   Full   Family Communication:  Discussed with patient Disposition Plan: Expect patient to be discharged to home after condition improves Consults called:     Admission status: Med surg Obs    Rondel Jumbo, PA-C [facility]  12/25/2016, 5:05 PM

## 2016-12-25 NOTE — ED Notes (Signed)
Patient transported to CT 

## 2016-12-25 NOTE — ED Notes (Signed)
Pt given water per RN 

## 2016-12-25 NOTE — ED Triage Notes (Addendum)
Pt reports for the ed for complaints of right sided flank pain, increase in urinary urgency, fever and feeling light headed since last Friday.  Patients heart rate is 150 in triage.  Pt reports having a history of being septic

## 2016-12-25 NOTE — Progress Notes (Signed)
Pharmacy Antibiotic Note Joshua Parrish is a 41 y.o. male admitted on 12/25/2016 with UTI.  Pharmacy has been consulted for ceftriaxone dosing. Pt received ceftriaxone 2 grams IV in ED earlier today.   Plan: 1. Ceftriaxone 1 gram IV every 24 hours starting on  4/27 2. F/u urine c/s and narrow abx as feasible  Height: 5\' 9"  (175.3 cm) Weight: 280 lb (127 kg) IBW/kg (Calculated) : 70.7  Temp (24hrs), Avg:100.2 F (37.9 C), Min:99 F (37.2 C), Max:101.4 F (38.6 C)   Recent Labs Lab 12/25/16 1141 12/25/16 1203 12/25/16 1507  WBC 18.6*  --   --   CREATININE 0.92  --   --   LATICACIDVEN  --  1.28 0.86    Estimated Creatinine Clearance: 139.3 mL/min (by C-G formula based on SCr of 0.92 mg/dL).    No Known Allergies  Antimicrobials this admission: 4/26 Ceftriaxone >>   Microbiology results: 4/26 UCx: UCx: px   Thank you for allowing pharmacy to be a part of this patient's care.  Vincenza Hews, PharmD, BCPS 12/25/2016, 5:17 PM

## 2016-12-26 DIAGNOSIS — N410 Acute prostatitis: Secondary | ICD-10-CM

## 2016-12-26 LAB — CBC
HCT: 38.3 % — ABNORMAL LOW (ref 39.0–52.0)
HEMOGLOBIN: 12.5 g/dL — AB (ref 13.0–17.0)
MCH: 29.2 pg (ref 26.0–34.0)
MCHC: 32.6 g/dL (ref 30.0–36.0)
MCV: 89.5 fL (ref 78.0–100.0)
PLATELETS: 230 10*3/uL (ref 150–400)
RBC: 4.28 MIL/uL (ref 4.22–5.81)
RDW: 12.9 % (ref 11.5–15.5)
WBC: 10.5 10*3/uL (ref 4.0–10.5)

## 2016-12-26 LAB — COMPREHENSIVE METABOLIC PANEL
ALK PHOS: 65 U/L (ref 38–126)
ALT: 52 U/L (ref 17–63)
ANION GAP: 7 (ref 5–15)
AST: 27 U/L (ref 15–41)
Albumin: 3.4 g/dL — ABNORMAL LOW (ref 3.5–5.0)
BUN: 7 mg/dL (ref 6–20)
CO2: 25 mmol/L (ref 22–32)
CREATININE: 0.87 mg/dL (ref 0.61–1.24)
Calcium: 8.5 mg/dL — ABNORMAL LOW (ref 8.9–10.3)
Chloride: 104 mmol/L (ref 101–111)
Glucose, Bld: 96 mg/dL (ref 65–99)
Potassium: 4 mmol/L (ref 3.5–5.1)
Sodium: 136 mmol/L (ref 135–145)
Total Bilirubin: 0.9 mg/dL (ref 0.3–1.2)
Total Protein: 6 g/dL — ABNORMAL LOW (ref 6.5–8.1)

## 2016-12-26 LAB — PROTIME-INR
INR: 1.15
PROTHROMBIN TIME: 14.8 s (ref 11.4–15.2)

## 2016-12-26 NOTE — ED Notes (Signed)
Pt stated that he is starting to feel burning during urination.

## 2016-12-26 NOTE — Progress Notes (Signed)
PROGRESS NOTE    Joshua Parrish  YQM:[id number] DOB: [birth year] DOA: 12/25/2016 PCP: Mauricio Po, FNP    Brief Narrative:  41 y.o. male with a Past Medical History of significant for migraines, depression, fatty liver who presents with suprapubic pain. Diagnosed with prostatitis   Assessment & Plan:   Sepsis ([place]) - currently resolving on IV antibiotics. Source most likely prostatitis - f/u blood cultures  Active Problems:   Prostatitis - continue IV antibiotics     GERD (gastroesophageal reflux disease) -  Currently on protonix  DVT prophylaxis: SCD's Code Status: Full Family Communication:d/c patient and spouse Disposition Plan: pending improvement in condition and resolution of fevers   Consultants:   None   Procedures: None   Antimicrobials: Ceftriaxone   Subjective: Pt has no new complaints  Objective: Vitals:   12/25/16 2330 12/26/16 0100 12/26/16 0145 12/26/16 0435  BP: 133/85 132/66 113/75 (!) 108/50  Pulse: 94 89 81 77  Resp: 18 15 16 18   Temp:   98.4 F (36.9 C) 97.8 F (36.6 C)  TempSrc:   Oral Oral  SpO2: 94% 98% 96% 95%  Weight:   127.1 kg (280 lb 3.3 oz)   Height:   5\' 9"  (1.753 m)     Intake/Output Summary (Last 24 hours) at 12/26/16 1704 Last data filed at 12/26/16 1400  Gross per 24 hour  Intake             3076 ml  Output             2360 ml  Net              716 ml   Filed Weights   12/25/16 1140 12/26/16 0145  Weight: 127 kg (280 lb) 127.1 kg (280 lb 3.3 oz)    Examination:  General exam: Appears calm and comfortable, in nad. Respiratory system: Clear to auscultation. Respiratory effort normal. Cardiovascular system: S1 & S2 heard, RRR. No JVD, murmurs, rubs, gallops or clicks. No pedal edema. Gastrointestinal system: Abdomen is nondistended, soft and nontender. No organomegaly or masses felt. Normal bowel sounds heard. Central nervous system: Alert and oriented. No focal neurological deficits. Extremities: Symmetric 5  x 5 power. Skin: No rashes, lesions or ulcers Psychiatry: Judgement and insight appear normal. Mood & affect appropriate.  GU: no cellulitis over scrotum  Data Reviewed: I have personally reviewed following labs and imaging studies  CBC:  Recent Labs Lab 12/25/16 1141 12/25/16 1716 12/26/16 0524  WBC 18.6* 16.7* 10.5  NEUTROABS  --  13.6*  --   HGB 15.1 14.0 12.5*  HCT 44.6 42.1 38.3*  MCV 87.8 88.4 89.5  PLT 329 278 528   Basic Metabolic Panel:  Recent Labs Lab 12/25/16 1141 12/26/16 0524  NA 134* 136  K 4.2 4.0  CL 103 104  CO2 25 25  GLUCOSE 110* 96  BUN 9 7  CREATININE 0.92 0.87  CALCIUM 9.6 8.5*   GFR: Estimated Creatinine Clearance: 147.5 mL/min (by C-G formula based on SCr of 0.87 mg/dL). Liver Function Tests:  Recent Labs Lab 12/26/16 0524  AST 27  ALT 52  ALKPHOS 65  BILITOT 0.9  PROT 6.0*  ALBUMIN 3.4*   No results for input(s): LIPASE, AMYLASE in the last 168 hours. No results for input(s): AMMONIA in the last 168 hours. Coagulation Profile:  Recent Labs Lab 12/25/16 1716 12/26/16 0524  INR 1.06 1.15   Cardiac Enzymes: No results for input(s): CKTOTAL, CKMB, CKMBINDEX, TROPONINI in the last 168  hours. BNP (last 3 results) No results for input(s): PROBNP in the last 8760 hours. HbA1C: No results for input(s): HGBA1C in the last 72 hours. CBG: No results for input(s): GLUCAP in the last 168 hours. Lipid Profile: No results for input(s): CHOL, HDL, LDLCALC, TRIG, CHOLHDL, LDLDIRECT in the last 72 hours. Thyroid Function Tests: No results for input(s): TSH, T4TOTAL, FREET4, T3FREE, THYROIDAB in the last 72 hours. Anemia Panel: No results for input(s): VITAMINB12, FOLATE, FERRITIN, TIBC, IRON, RETICCTPCT in the last 72 hours. Sepsis Labs:  Recent Labs Lab 12/25/16 1203 12/25/16 1507 12/25/16 1716 12/25/16 1959  PROCALCITON  --   --  <0.10  --   LATICACIDVEN 1.28 0.86 0.9 0.9    Recent Results (from the past 240 hour(s))    Blood culture (routine x 2)     Status: None (Preliminary result)   Collection Time: 12/25/16 12:28 PM  Result Value Ref Range Status   Specimen Description BLOOD RIGHT ANTECUBITAL  Final   Special Requests   Final    BOTTLES DRAWN AEROBIC AND ANAEROBIC Blood Culture adequate volume   Culture NO GROWTH < 24 HOURS  Final   Report Status PENDING  Incomplete  Blood culture (routine x 2)     Status: None (Preliminary result)   Collection Time: 12/25/16 12:30 PM  Result Value Ref Range Status   Specimen Description BLOOD RIGHT HAND  Final   Special Requests   Final    BOTTLES DRAWN AEROBIC AND ANAEROBIC Blood Culture adequate volume   Culture NO GROWTH < 24 HOURS  Final   Report Status PENDING  Incomplete     Radiology Studies: Ct Abdomen Pelvis W Contrast  Result Date: 12/25/2016 CLINICAL DATA:  Fever, nausea, diarrhea, and abdominal pain for 6 days. EXAM: CT ABDOMEN AND PELVIS WITH CONTRAST TECHNIQUE: Multidetector CT imaging of the abdomen and pelvis was performed using the standard protocol following bolus administration of intravenous contrast. CONTRAST:  162mL ISOVUE-300 IOPAMIDOL (ISOVUE-300) INJECTION 61% COMPARISON:  09/05/2015 FINDINGS: Lower chest: Minimal dependent atelectasis in the right lung base. No pleural effusion. Hepatobiliary: Hepatic steatosis. Prior cholecystectomy. No biliary dilatation. Pancreas: Unremarkable. Spleen: Unremarkable. Adrenals/Urinary Tract: Unremarkable adrenal glands. No evidence of renal mass, calculi, or hydronephrosis. Unremarkable bladder. Stomach/Bowel: The stomach is within normal limits. No bowel dilatation or wall thickening. Prior appendectomy. Vascular/Lymphatic: No significant vascular findings. No enlarged lymph nodes. Reproductive: Unremarkable prostate. Other: No intraperitoneal free fluid. No abdominal wall mass or hernia. Musculoskeletal: Mild disc degeneration at L2-3 with mild disc space narrowing, vacuum disc, mild disc bulging, and  spurring. IMPRESSION: No acute abnormality identified in the abdomen or pelvis. Electronically Signed   By: Logan Bores M.D.   On: 12/25/2016 15:19   Dg Chest Port 1 View  Result Date: 12/25/2016 CLINICAL DATA:  Sepsis. EXAM: PORTABLE CHEST 1 VIEW COMPARISON:  01/18/2016 and prior exams FINDINGS: This is a mildly low volume film. The cardiomediastinal silhouette is unremarkable. There is no evidence of focal airspace disease, pulmonary edema, suspicious pulmonary nodule/mass, pleural effusion, or pneumothorax. No acute bony abnormalities are identified. IMPRESSION: No active disease. Electronically Signed   By: Margarette Canada M.D.   On: 12/25/2016 17:14   Ct Maxillofacial Wo Contrast  Result Date: 12/25/2016 CLINICAL DATA:  Sinus pain and congestion for 1 week. EXAM: CT MAXILLOFACIAL WITHOUT CONTRAST TECHNIQUE: Multidetector CT imaging of the maxillofacial structures was performed. Multiplanar CT image reconstructions were also generated. A small metallic BB was placed on the right temple in order to reliably  differentiate right from left. COMPARISON:  None. FINDINGS: Osseous: No fracture or mandibular dislocation. No destructive process. Orbits: Negative. No traumatic or inflammatory finding. Sinuses: The paranasal sinuses, mastoid air cells and middle/ inner ears are clear. There is no evidence of sinusitis. The ostiomeatal complexes are unremarkable. Nasal septal deviation to the right is present. Soft tissues: Negative. Limited intracranial: No significant or unexpected finding. IMPRESSION: No evidence of sinusitis. No acute or significant abnormality. Electronically Signed   By: Margarette Canada M.D.   On: 12/25/2016 17:52    Scheduled Meds: . gabapentin  300 mg Oral TID  .  HYDROmorphone (DILAUDID) injection  1 mg Intravenous Once  . meloxicam  15 mg Oral Daily  . pantoprazole  40 mg Oral BID   Continuous Infusions: . sodium chloride 100 mL/hr at 12/26/16 1433  . cefTRIAXone (ROCEPHIN)  IV 1 g  (12/26/16 1421)     LOS: 1 day    Time spent: > 35 minutes  Velvet Bathe, MD [facility] Pager [phone]  If 7PM-7AM, please contact night-coverage [email or web address] Password [facility] 12/26/2016, 5:04 PM

## 2016-12-26 NOTE — Plan of Care (Signed)
RN from [facility], paged this NP questioning pt's need to be admitted to SDU. Pt has been holding in ED with a dx of prostatitis. NP reviewed chart. Pt had a fever and soft BP upon arrival to ED. He received appropriate boluses and has not had a fever since 1600 hrs 12/25/16. His BP at present is running 120-130s. He is not tachycardic. His Lactate was normal. Pt is alert and oriented. He is on abx and cultures are cooking. At this time, NP believes pt is stable enough to go to med-surg. H&P by admitting MD/PA stated admit to med-surg in one place and SDU in another. NP does not see any hx cardiac issues, so do not feel tele is needed. Order changed to admit pt to obs on med-surg.  KJKG, NP [facility]

## 2016-12-27 DIAGNOSIS — N410 Acute prostatitis: Secondary | ICD-10-CM | POA: Diagnosis not present

## 2016-12-27 LAB — PROCALCITONIN: PROCALCITONIN: 0.11 ng/mL

## 2016-12-27 NOTE — Progress Notes (Signed)
PROGRESS NOTE    Joshua Parrish  ZOX:[id number] DOB: 08/27/1976 DOA: 12/25/2016 PCP: Mauricio Po, FNP    Brief Narrative:  41 y.o. male with a Past Medical History of significant for migraines, depression, fatty liver who presents with suprapubic pain. Diagnosed with prostatitis   Assessment & Plan:   Sepsis ([place]) - currently resolving on IV antibiotics. Source most likely prostatitis. Patient states that discomfort is still there especially when he urinates. I would favor continuing one more day of IV antibiotics given his persistent symptoms and his reported improvement in condition on current antibiotic therapy. - f/u blood cultures still pending as well as urine culture  Active Problems:   Prostatitis - continue IV antibiotics - Reported improvement but still reports pain with urination    GERD (gastroesophageal reflux disease) -  Currently on protonix  DVT prophylaxis: SCD's Code Status: Full Family Communication:d/c patient and spouse Disposition Plan: Discharge next a.m. with continued improvement in condition   Consultants:   None   Procedures: None   Antimicrobials: Ceftriaxone   Subjective: Patient reports improvement in condition but still having discomfort with urination.  Objective: Vitals:   12/26/16 0145 12/26/16 0435 12/26/16 2128 12/27/16 0438  BP: 113/75 (!) 108/50 (!) 152/91 (!) 144/85  Pulse: 81 77 74 69  Resp: 16 18 18 18   Temp: 98.4 F (36.9 C) 97.8 F (36.6 C) 98.2 F (36.8 C) 98.1 F (36.7 C)  TempSrc: Oral Oral Oral Oral  SpO2: 96% 95% 97% 94%  Weight: 127.1 kg (280 lb 3.3 oz)     Height: 5\' 9"  (1.753 m)       Intake/Output Summary (Last 24 hours) at 12/27/16 1554 Last data filed at 12/27/16 0713  Gross per 24 hour  Intake          1908.33 ml  Output             4625 ml  Net         -2716.67 ml   Filed Weights   12/25/16 1140 12/26/16 0145  Weight: 127 kg (280 lb) 127.1 kg (280 lb 3.3 oz)    Examination:  General  exam: Appears calm and comfortable, in nad. Respiratory system: Clear to auscultation. Respiratory effort normal. Cardiovascular system: S1 & S2 heard, RRR. No JVD, murmurs, rubs, gallops or clicks. No pedal edema. Gastrointestinal system: Abdomen is nondistended, soft and nontender. No organomegaly or masses felt. Normal bowel sounds heard. Central nervous system: Alert and oriented. No focal neurological deficits. Extremities: Symmetric 5 x 5 power. Skin: No rashes, lesions or ulcers Psychiatry: Judgement and insight appear normal. Mood & affect appropriate.  GU: no cellulitis over scrotum  Data Reviewed: I have personally reviewed following labs and imaging studies  CBC:  Recent Labs Lab 12/25/16 1141 12/25/16 1716 12/26/16 0524  WBC 18.6* 16.7* 10.5  NEUTROABS  --  13.6*  --   HGB 15.1 14.0 12.5*  HCT 44.6 42.1 38.3*  MCV 87.8 88.4 89.5  PLT 329 278 811   Basic Metabolic Panel:  Recent Labs Lab 12/25/16 1141 12/26/16 0524  NA 134* 136  K 4.2 4.0  CL 103 104  CO2 25 25  GLUCOSE 110* 96  BUN 9 7  CREATININE 0.92 0.87  CALCIUM 9.6 8.5*   GFR: Estimated Creatinine Clearance: 147.5 mL/min (by C-G formula based on SCr of 0.87 mg/dL). Liver Function Tests:  Recent Labs Lab 12/26/16 0524  AST 27  ALT 52  ALKPHOS 65  BILITOT 0.9  PROT 6.0*  ALBUMIN 3.4*   No results for input(s): LIPASE, AMYLASE in the last 168 hours. No results for input(s): AMMONIA in the last 168 hours. Coagulation Profile:  Recent Labs Lab 12/25/16 1716 12/26/16 0524  INR 1.06 1.15   Cardiac Enzymes: No results for input(s): CKTOTAL, CKMB, CKMBINDEX, TROPONINI in the last 168 hours. BNP (last 3 results) No results for input(s): PROBNP in the last 8760 hours. HbA1C: No results for input(s): HGBA1C in the last 72 hours. CBG: No results for input(s): GLUCAP in the last 168 hours. Lipid Profile: No results for input(s): CHOL, HDL, LDLCALC, TRIG, CHOLHDL, LDLDIRECT in the last 72  hours. Thyroid Function Tests: No results for input(s): TSH, T4TOTAL, FREET4, T3FREE, THYROIDAB in the last 72 hours. Anemia Panel: No results for input(s): VITAMINB12, FOLATE, FERRITIN, TIBC, IRON, RETICCTPCT in the last 72 hours. Sepsis Labs:  Recent Labs Lab 12/25/16 1203 12/25/16 1507 12/25/16 1716 12/25/16 1959 12/27/16 0544  PROCALCITON  --   --  <0.10  --  0.11  LATICACIDVEN 1.28 0.86 0.9 0.9  --     Recent Results (from the past 240 hour(s))  Culture, Urine     Status: Abnormal (Preliminary result)   Collection Time: 12/25/16 12:26 PM  Result Value Ref Range Status   Specimen Description URINE, RANDOM  Final   Special Requests ADDED [id number]  Final   Culture >=100,000 COLONIES/mL ESCHERICHIA COLI (A)  Final   Report Status PENDING  Incomplete  Blood culture (routine x 2)     Status: None (Preliminary result)   Collection Time: 12/25/16 12:28 PM  Result Value Ref Range Status   Specimen Description BLOOD RIGHT ANTECUBITAL  Final   Special Requests   Final    BOTTLES DRAWN AEROBIC AND ANAEROBIC Blood Culture adequate volume   Culture NO GROWTH 2 DAYS  Final   Report Status PENDING  Incomplete  Blood culture (routine x 2)     Status: None (Preliminary result)   Collection Time: 12/25/16 12:30 PM  Result Value Ref Range Status   Specimen Description BLOOD RIGHT HAND  Final   Special Requests   Final    BOTTLES DRAWN AEROBIC AND ANAEROBIC Blood Culture adequate volume   Culture NO GROWTH 2 DAYS  Final   Report Status PENDING  Incomplete  Culture, blood (Routine X 2) w Reflex to ID Panel     Status: None (Preliminary result)   Collection Time: 12/26/16  5:24 AM  Result Value Ref Range Status   Specimen Description BLOOD LEFT ANTECUBITAL  Final   Special Requests   Final    BOTTLES DRAWN AEROBIC AND ANAEROBIC Blood Culture adequate volume   Culture NO GROWTH 1 DAY  Final   Report Status PENDING  Incomplete  Culture, blood (Routine X 2) w Reflex to ID Panel      Status: None (Preliminary result)   Collection Time: 12/26/16  5:28 AM  Result Value Ref Range Status   Specimen Description BLOOD RIGHT HAND  Final   Special Requests IN PEDIATRIC BOTTLE Blood Culture adequate volume  Final   Culture NO GROWTH 1 DAY  Final   Report Status PENDING  Incomplete     Radiology Studies: Dg Chest Port 1 View  Result Date: 12/25/2016 CLINICAL DATA:  Sepsis. EXAM: PORTABLE CHEST 1 VIEW COMPARISON:  01/18/2016 and prior exams FINDINGS: This is a mildly low volume film. The cardiomediastinal silhouette is unremarkable. There is no evidence of focal airspace disease, pulmonary edema, suspicious pulmonary nodule/mass, pleural effusion, or  pneumothorax. No acute bony abnormalities are identified. IMPRESSION: No active disease. Electronically Signed   By: Margarette Canada M.D.   On: 12/25/2016 17:14   Ct Maxillofacial Wo Contrast  Result Date: 12/25/2016 CLINICAL DATA:  Sinus pain and congestion for 1 week. EXAM: CT MAXILLOFACIAL WITHOUT CONTRAST TECHNIQUE: Multidetector CT imaging of the maxillofacial structures was performed. Multiplanar CT image reconstructions were also generated. A small metallic BB was placed on the right temple in order to reliably differentiate right from left. COMPARISON:  None. FINDINGS: Osseous: No fracture or mandibular dislocation. No destructive process. Orbits: Negative. No traumatic or inflammatory finding. Sinuses: The paranasal sinuses, mastoid air cells and middle/ inner ears are clear. There is no evidence of sinusitis. The ostiomeatal complexes are unremarkable. Nasal septal deviation to the right is present. Soft tissues: Negative. Limited intracranial: No significant or unexpected finding. IMPRESSION: No evidence of sinusitis. No acute or significant abnormality. Electronically Signed   By: Margarette Canada M.D.   On: 12/25/2016 17:52    Scheduled Meds: . gabapentin  300 mg Oral TID  .  HYDROmorphone (DILAUDID) injection  1 mg Intravenous Once    . meloxicam  15 mg Oral Daily  . pantoprazole  40 mg Oral BID   Continuous Infusions: . sodium chloride 100 mL/hr at 12/27/16 1028  . cefTRIAXone (ROCEPHIN)  IV Stopped (12/27/16 1355)     LOS: 1 day    Time spent: > 35 minutes  Velvet Bathe, MD [facility] Pager [phone]  If 7PM-7AM, please contact night-coverage [email or web address] Password [facility] 12/27/2016, 3:54 PM

## 2016-12-28 DIAGNOSIS — N410 Acute prostatitis: Secondary | ICD-10-CM | POA: Diagnosis not present

## 2016-12-28 LAB — URINE CULTURE: Culture: >=100000 — AB

## 2016-12-28 NOTE — Progress Notes (Signed)
Pharmacy Antibiotic Note Joshua Parrish is a 41 y.o. male admitted on 12/25/2016 with UTI.  Pharmacy has been consulted for ceftriaxone dosing.   Plan: Continue ceftriaxone 1g IV Q24h Monitor clinical picture F/U abx deescalation / LOT  Consider de-escalating to cephalexin 500mg  PO TID x 4 more days to complete 7 day course  Height: 5\' 9"  (175.3 cm) Weight: 280 lb 3.3 oz (127.1 kg) IBW/kg (Calculated) : 70.7  Temp (24hrs), Avg:97.9 F (36.6 C), Min:97.8 F (36.6 C), Max:98 F (36.7 C)   Recent Labs Lab 12/25/16 1141 12/25/16 1203 12/25/16 1507 12/25/16 1716 12/25/16 1959 12/26/16 0524  WBC 18.6*  --   --  16.7*  --  10.5  CREATININE 0.92  --   --   --   --  0.87  LATICACIDVEN  --  1.28 0.86 0.9 0.9  --     Estimated Creatinine Clearance: 147.5 mL/min (by C-G formula based on SCr of 0.87 mg/dL).    No Known Allergies  Antimicrobials this admission: 4/26 Ceftriaxone >>   Microbiology results: 4/26 UCx: UCx: E. Coli (pan sensitive)   Thank you for allowing pharmacy to be a part of this patient's care.  Elenor Quinones, PharmD, BCPS Clinical Pharmacist Pager [phone] 12/28/2016 7:52 AM

## 2016-12-28 NOTE — Progress Notes (Signed)
PROGRESS NOTE    Joshua Parrish  FBP:[id number] DOB: 10/14/1975 DOA: 12/25/2016 PCP: Mauricio Po, FNP    Brief Narrative:  41 y.o. male with a Past Medical History of significant for migraines, depression, fatty liver who presents with suprapubic pain. Diagnosed with prostatitis   Assessment & Plan:   Sepsis ([place]) - currently resolving on IV antibiotics. Source most likely prostatitis. Patient states that discomfort is still there especially when he urinates. I would favor continuing one more day of IV antibiotics given his persistent symptoms and his reported improvement in condition on current antibiotic therapy. - f/u blood cultures still pending as well as urine culture  Active Problems:   Prostatitis - continue IV antibiotics for one more day. - Reported improvement but still reports pain with urination    GERD (gastroesophageal reflux disease) -  Currently on protonix  DVT prophylaxis: SCD's Code Status: Full Family Communication:d/c patient and spouse Disposition Plan: Discharge next a.m. On oral antibiotic   Consultants:   None   Procedures: None   Antimicrobials: Ceftriaxone   Subjective: Patient reports improvement in condition but still having discomfort with urination.  Objective: Vitals:   12/27/16 1340 12/27/16 2109 12/28/16 0524 12/28/16 1300  BP: 113/75 117/80 129/75 140/73  Pulse: 75 69 62   Resp: 16 17 18    Temp: 97.8 F (36.6 C) 97.9 F (36.6 C) 98 F (36.7 C)   TempSrc: Oral Oral Oral   SpO2: 95% 96% 99%   Weight:      Height:        Intake/Output Summary (Last 24 hours) at 12/28/16 1421 Last data filed at 12/28/16 0600  Gross per 24 hour  Intake             1100 ml  Output                0 ml  Net             1100 ml   Filed Weights   12/25/16 1140 12/26/16 0145  Weight: 127 kg (280 lb) 127.1 kg (280 lb 3.3 oz)    Examination:  General exam: Appears calm and comfortable, in nad. Respiratory system: Clear to auscultation.  Respiratory effort normal. Cardiovascular system: S1 & S2 heard, RRR. No JVD, murmurs, rubs, gallops or clicks. No pedal edema. Gastrointestinal system: Abdomen is nondistended, soft and nontender. No organomegaly or masses felt. Normal bowel sounds heard. Central nervous system: Alert and oriented. No focal neurological deficits. Extremities: Symmetric 5 x 5 power. Skin: No rashes, lesions or ulcers Psychiatry: Judgement and insight appear normal. Mood & affect appropriate.  GU: no cellulitis over scrotum  Data Reviewed: I have personally reviewed following labs and imaging studies  CBC:  Recent Labs Lab 12/25/16 1141 12/25/16 1716 12/26/16 0524  WBC 18.6* 16.7* 10.5  NEUTROABS  --  13.6*  --   HGB 15.1 14.0 12.5*  HCT 44.6 42.1 38.3*  MCV 87.8 88.4 89.5  PLT 329 278 824   Basic Metabolic Panel:  Recent Labs Lab 12/25/16 1141 12/26/16 0524  NA 134* 136  K 4.2 4.0  CL 103 104  CO2 25 25  GLUCOSE 110* 96  BUN 9 7  CREATININE 0.92 0.87  CALCIUM 9.6 8.5*   GFR: Estimated Creatinine Clearance: 147.5 mL/min (by C-G formula based on SCr of 0.87 mg/dL). Liver Function Tests:  Recent Labs Lab 12/26/16 0524  AST 27  ALT 52  ALKPHOS 65  BILITOT 0.9  PROT 6.0*  ALBUMIN  3.4*   No results for input(s): LIPASE, AMYLASE in the last 168 hours. No results for input(s): AMMONIA in the last 168 hours. Coagulation Profile:  Recent Labs Lab 12/25/16 1716 12/26/16 0524  INR 1.06 1.15   Cardiac Enzymes: No results for input(s): CKTOTAL, CKMB, CKMBINDEX, TROPONINI in the last 168 hours. BNP (last 3 results) No results for input(s): PROBNP in the last 8760 hours. HbA1C: No results for input(s): HGBA1C in the last 72 hours. CBG: No results for input(s): GLUCAP in the last 168 hours. Lipid Profile: No results for input(s): CHOL, HDL, LDLCALC, TRIG, CHOLHDL, LDLDIRECT in the last 72 hours. Thyroid Function Tests: No results for input(s): TSH, T4TOTAL, FREET4, T3FREE,  THYROIDAB in the last 72 hours. Anemia Panel: No results for input(s): VITAMINB12, FOLATE, FERRITIN, TIBC, IRON, RETICCTPCT in the last 72 hours. Sepsis Labs:  Recent Labs Lab 12/25/16 1203 12/25/16 1507 12/25/16 1716 12/25/16 1959 12/27/16 0544  PROCALCITON  --   --  <0.10  --  0.11  LATICACIDVEN 1.28 0.86 0.9 0.9  --     Recent Results (from the past 240 hour(s))  Culture, Urine     Status: Abnormal   Collection Time: 12/25/16 12:26 PM  Result Value Ref Range Status   Specimen Description URINE, RANDOM  Final   Special Requests ADDED [id number]  Final   Culture >=100,000 COLONIES/mL ESCHERICHIA COLI (A)  Final   Report Status 12/28/2016 FINAL  Final   Organism ID, Bacteria ESCHERICHIA COLI (A)  Final      Susceptibility   Escherichia coli - MIC*    AMPICILLIN 4 SENSITIVE Sensitive     CEFAZOLIN <=4 SENSITIVE Sensitive     CEFTRIAXONE <=1 SENSITIVE Sensitive     CIPROFLOXACIN <=0.25 SENSITIVE Sensitive     GENTAMICIN <=1 SENSITIVE Sensitive     IMIPENEM <=0.25 SENSITIVE Sensitive     NITROFURANTOIN <=16 SENSITIVE Sensitive     TRIMETH/SULFA <=20 SENSITIVE Sensitive     AMPICILLIN/SULBACTAM <=2 SENSITIVE Sensitive     PIP/TAZO <=4 SENSITIVE Sensitive     Extended ESBL NEGATIVE Sensitive     * >=100,000 COLONIES/mL ESCHERICHIA COLI  Blood culture (routine x 2)     Status: None (Preliminary result)   Collection Time: 12/25/16 12:28 PM  Result Value Ref Range Status   Specimen Description BLOOD RIGHT ANTECUBITAL  Final   Special Requests   Final    BOTTLES DRAWN AEROBIC AND ANAEROBIC Blood Culture adequate volume   Culture NO GROWTH 3 DAYS  Final   Report Status PENDING  Incomplete  Blood culture (routine x 2)     Status: None (Preliminary result)   Collection Time: 12/25/16 12:30 PM  Result Value Ref Range Status   Specimen Description BLOOD RIGHT HAND  Final   Special Requests   Final    BOTTLES DRAWN AEROBIC AND ANAEROBIC Blood Culture adequate volume   Culture  NO GROWTH 3 DAYS  Final   Report Status PENDING  Incomplete  Culture, blood (Routine X 2) w Reflex to ID Panel     Status: None (Preliminary result)   Collection Time: 12/26/16  5:24 AM  Result Value Ref Range Status   Specimen Description BLOOD LEFT ANTECUBITAL  Final   Special Requests   Final    BOTTLES DRAWN AEROBIC AND ANAEROBIC Blood Culture adequate volume   Culture NO GROWTH 2 DAYS  Final   Report Status PENDING  Incomplete  Culture, blood (Routine X 2) w Reflex to ID Panel  Status: None (Preliminary result)   Collection Time: 12/26/16  5:28 AM  Result Value Ref Range Status   Specimen Description BLOOD RIGHT HAND  Final   Special Requests IN PEDIATRIC BOTTLE Blood Culture adequate volume  Final   Culture NO GROWTH 2 DAYS  Final   Report Status PENDING  Incomplete     Radiology Studies: No results found.  Scheduled Meds: . gabapentin  300 mg Oral TID  .  HYDROmorphone (DILAUDID) injection  1 mg Intravenous Once  . meloxicam  15 mg Oral Daily  . pantoprazole  40 mg Oral BID   Continuous Infusions: . sodium chloride 100 mL/hr at 12/28/16 0500  . cefTRIAXone (ROCEPHIN)  IV Stopped (12/27/16 1355)     LOS: 1 day    Time spent: > 35 minutes  Velvet Bathe, MD [facility] Pager [phone]  If 7PM-7AM, please contact night-coverage [email or web address] Password [email or web address] 12/28/2016, 2:21 PM

## 2016-12-29 DIAGNOSIS — R309 Painful micturition, unspecified: Secondary | ICD-10-CM | POA: Diagnosis not present

## 2016-12-29 DIAGNOSIS — A419 Sepsis, unspecified organism: Secondary | ICD-10-CM | POA: Diagnosis not present

## 2016-12-29 DIAGNOSIS — N410 Acute prostatitis: Secondary | ICD-10-CM | POA: Diagnosis not present

## 2016-12-29 LAB — PROCALCITONIN

## 2016-12-29 MED ORDER — CIPROFLOXACIN HCL 500 MG PO TABS: 500.0000 mg | ORAL_TABLET | Freq: Two times a day (BID) | ORAL | 0 refills | Status: DC

## 2016-12-29 MED ORDER — GABAPENTIN 300 MG PO CAPS: 300.0000 mg | ORAL_CAPSULE | Freq: Three times a day (TID) | ORAL | Status: DC

## 2016-12-29 NOTE — Progress Notes (Signed)
Patient discharged to home with instructions and prescription. 

## 2016-12-29 NOTE — Consult Note (Signed)
Urology Consult  Referring physician: Hillary Bow Reason for referral: Prostatitis  Chief Complaint: Prostatitis  History of Present Illness: Treated for prostatitis and sepsis; urine c/s positive; was having urgency and dysuria; voiding hourly last night but pressure much better; this am scrotum a abit sore  Urine stream good but some spray for few months  Describes hypospadias and urethral reconstruction child  Modifying factors: There are no other modifying factors  Associated signs and symptoms: There are no other associated signs and symptoms Aggravating and relieving factors: There are no other aggravating or relieving factors Severity: Moderate Duration: Improved  Past Medical History:  Diagnosis Date  . Chicken pox   . Depression   . Fatty liver   . GERD (gastroesophageal reflux disease)   . Migraines   . Osteoarthritis of shoulder    left  . Rotator cuff impingement syndrome of left shoulder 06/19/2015  . Sinusitis    Past Surgical History:  Procedure Laterality Date  . APPENDECTOMY    . CERVICAL LAMINECTOMY     has plates and fusion  . CHOLECYSTECTOMY N/A 12/24/2015   Procedure: LAPAROSCOPIC CHOLECYSTECTOMY ;  Surgeon: [name] Pickerel, MD;  Location: [facility];  Service: General;  Laterality: N/A;  . CLAVICLE EXCISION Left   . HYPOSPADIAS CORRECTION     as child  . KNEE SURGERY Bilateral    multiple  . SHOULDER ARTHROSCOPY WITH ROTATOR CUFF REPAIR Left 06/26/2015   Procedure: SHOULDER ARTHROSCOPY WITH ROTATOR CUFF REPAIR;  Surgeon: Elsie Saas, MD;  Location: [facility];  Service: Orthopedics;  Laterality: Left;    Medications: I have reviewed the patient's current medications. Allergies: No Known Allergies  Family History  Problem Relation Age of Onset  . Arthritis Mother   . Ovarian cancer Mother   . Alcohol abuse Father   . Breast cancer Maternal Grandmother   . Cancer Maternal Grandmother     spinal surgery  . Breast cancer Maternal  Grandfather   . Cancer Other   . Hypertension Other   . Diabetes Other   . Colon cancer Neg Hx    Social History:  reports that he has never smoked. His smokeless tobacco use includes Chew. He reports that he drinks alcohol. He reports that he does not use drugs.  ROS: All systems are reviewed and negative except as noted. Rest negative  Physical Exam:  Vital signs in last 24 hours: Temp:  [97.7 F (36.5 C)-97.9 F (36.6 C)] 97.8 F (36.6 C) (04/30 0458) Pulse Rate:  [63-69] 63 (04/30 0458) Resp:  [17-18] 18 (04/30 0458) BP: (133-147)/(73-81) 133/74 (04/30 0458) SpO2:  [96 %-98 %] 98 % (04/30 0458)  Cardiovascular: Skin warm; not flushed Respiratory: Breaths quiet; no shortness of breath Abdomen: No masses Neurological: Normal sensation to touch Musculoskeletal: Normal motor function arms and legs Lymphatics: No inguinal adenopathy Skin: No rashes Genitourinary:scrotal exam normal  Laboratory Data:  Results for orders placed or performed during the hospital encounter of 12/25/16 (from the past 72 hour(s))  Procalcitonin     Status: None   Collection Time: 12/27/16  5:44 AM  Result Value Ref Range   Procalcitonin 0.11 ng/mL    Comment:        Interpretation: PCT (Procalcitonin) <= 0.5 ng/mL: Systemic infection (sepsis) is not likely. Local bacterial infection is possible. (NOTE)         ICU PCT Algorithm               Non ICU PCT Algorithm    ----------------------------     ------------------------------  PCT < 0.25 ng/mL                 PCT < 0.1 ng/mL     Stopping of antibiotics            Stopping of antibiotics       strongly encouraged.               strongly encouraged.    ----------------------------     ------------------------------       PCT level decrease by               PCT < 0.25 ng/mL       >= 80% from peak PCT       OR PCT 0.25 - 0.5 ng/mL          Stopping of antibiotics                                             encouraged.     Stopping  of antibiotics           encouraged.    ----------------------------     ------------------------------       PCT level decrease by              PCT >= 0.25 ng/mL       < 80% from peak PCT        AND PCT >= 0.5 ng/mL            Continuin g antibiotics                                              encouraged.       Continuing antibiotics            encouraged.    ----------------------------     ------------------------------     PCT level increase compared          PCT > 0.5 ng/mL         with peak PCT AND          PCT >= 0.5 ng/mL             Escalation of antibiotics                                          strongly encouraged.      Escalation of antibiotics        strongly encouraged.   Procalcitonin     Status: None   Collection Time: 12/29/16  6:09 AM  Result Value Ref Range   Procalcitonin <0.10 ng/mL    Comment:        Interpretation: PCT (Procalcitonin) <= 0.5 ng/mL: Systemic infection (sepsis) is not likely. Local bacterial infection is possible. (NOTE)         ICU PCT Algorithm               Non ICU PCT Algorithm    ----------------------------     ------------------------------         PCT < 0.25 ng/mL                 PCT < 0.1 ng/mL  Stopping of antibiotics            Stopping of antibiotics       strongly encouraged.               strongly encouraged.    ----------------------------     ------------------------------       PCT level decrease by               PCT < 0.25 ng/mL       >= 80% from peak PCT       OR PCT 0.25 - 0.5 ng/mL          Stopping of antibiotics                                             encouraged.     Stopping of antibiotics           encouraged.    ----------------------------     ------------------------------       PCT level decrease by              PCT >= 0.25 ng/mL       < 80% from peak PCT        AND PCT >= 0.5 ng/mL            Continuin g antibiotics                                              encouraged.       Continuing  antibiotics            encouraged.    ----------------------------     ------------------------------     PCT level increase compared          PCT > 0.5 ng/mL         with peak PCT AND          PCT >= 0.5 ng/mL             Escalation of antibiotics                                          strongly encouraged.      Escalation of antibiotics        strongly encouraged.    Recent Results (from the past 240 hour(s))  Culture, Urine     Status: Abnormal   Collection Time: 12/25/16 12:26 PM  Result Value Ref Range Status   Specimen Description URINE, RANDOM  Final   Special Requests ADDED [id number]  Final   Culture >=100,000 COLONIES/mL ESCHERICHIA COLI (A)  Final   Report Status 12/28/2016 FINAL  Final   Organism ID, Bacteria ESCHERICHIA COLI (A)  Final      Susceptibility   Escherichia coli - MIC*    AMPICILLIN 4 SENSITIVE Sensitive     CEFAZOLIN <=4 SENSITIVE Sensitive     CEFTRIAXONE <=1 SENSITIVE Sensitive     CIPROFLOXACIN <=0.25 SENSITIVE Sensitive     GENTAMICIN <=1 SENSITIVE Sensitive     IMIPENEM <=0.25 SENSITIVE Sensitive     NITROFURANTOIN <=16 SENSITIVE Sensitive     TRIMETH/SULFA <=20 SENSITIVE Sensitive  AMPICILLIN/SULBACTAM <=2 SENSITIVE Sensitive     PIP/TAZO <=4 SENSITIVE Sensitive     Extended ESBL NEGATIVE Sensitive     * >=100,000 COLONIES/mL ESCHERICHIA COLI  Blood culture (routine x 2)     Status: None (Preliminary result)   Collection Time: 12/25/16 12:28 PM  Result Value Ref Range Status   Specimen Description BLOOD RIGHT ANTECUBITAL  Final   Special Requests   Final    BOTTLES DRAWN AEROBIC AND ANAEROBIC Blood Culture adequate volume   Culture NO GROWTH 3 DAYS  Final   Report Status PENDING  Incomplete  Blood culture (routine x 2)     Status: None (Preliminary result)   Collection Time: 12/25/16 12:30 PM  Result Value Ref Range Status   Specimen Description BLOOD RIGHT HAND  Final   Special Requests   Final    BOTTLES DRAWN AEROBIC AND ANAEROBIC  Blood Culture adequate volume   Culture NO GROWTH 3 DAYS  Final   Report Status PENDING  Incomplete  Culture, blood (Routine X 2) w Reflex to ID Panel     Status: None (Preliminary result)   Collection Time: 12/26/16  5:24 AM  Result Value Ref Range Status   Specimen Description BLOOD LEFT ANTECUBITAL  Final   Special Requests   Final    BOTTLES DRAWN AEROBIC AND ANAEROBIC Blood Culture adequate volume   Culture NO GROWTH 2 DAYS  Final   Report Status PENDING  Incomplete  Culture, blood (Routine X 2) w Reflex to ID Panel     Status: None (Preliminary result)   Collection Time: 12/26/16  5:28 AM  Result Value Ref Range Status   Specimen Description BLOOD RIGHT HAND  Final   Special Requests IN PEDIATRIC BOTTLE Blood Culture adequate volume  Final   Culture NO GROWTH 2 DAYS  Final   Report Status PENDING  Incomplete   Creatinine:  Recent Labs  12/25/16 1141 12/26/16 0524  CREATININE 0.92 0.87    Xrays: See report/chart Normal Ct scan  Impression/Assessment:  Treat as prostatitis Send home with cipro 500 mg bid for 30 days  Plan:  Gave f/up instructions with me as outpt  Joshua Parrish A 12/29/2016, 12:20 PM

## 2016-12-29 NOTE — Discharge Summary (Signed)
Physician Discharge Summary  Joshua Parrish CHE:[id number] DOB: [birth year] DOA: 12/25/2016  PCP: Mauricio Po, FNP  Admit date: 12/25/2016 Discharge date: 12/29/2016  Time spent: > 35  minutes  Recommendations for Outpatient Follow-up:  1. Ensure patient completes prolonged antibiotic course.   Discharge Diagnoses:  Active Problems:   Prostatitis   Sepsis ([place])   GERD (gastroesophageal reflux disease)   Discharge Condition: stable  Diet recommendation: regular diet  Filed Weights   12/25/16 1140 12/26/16 0145  Weight: 127 kg (280 lb) 127.1 kg (280 lb 3.3 oz)    History of present illness:  41 y.o.malewith a Past Medical History of significant for migraines, depression, fatty liver who presents with suprapubic pain. Diagnosed with prostatitis  Hospital Course:  Sepsis - resolved with IV antibiotics - blood cultures remained negative on day of d/c  Prostatitis - Evaluated by Urology who recommended 30 days of oral cipro  Procedures:  None  Consultations:  Urologist  Discharge Exam: Vitals:   12/29/16 0458 12/29/16 1352  BP: 133/74 (!) 146/92  Pulse: 63 71  Resp: 18 18  Temp: 97.8 F (36.6 C) 98.2 F (36.8 C)    General: Pt in nad, alert and awake Cardiovascular: rrr, no rubs Respiratory: no increased wob, no wheezes  Discharge Instructions   Discharge Instructions    Call MD for:  extreme fatigue    Complete by:  As directed    Call MD for:  temperature >100.4    Complete by:  As directed    Call MD for:  temperature >100.4    Complete by:  As directed    Diet - low sodium heart healthy    Complete by:  As directed    Diet - low sodium heart healthy    Complete by:  As directed    Discharge instructions    Complete by:  As directed    Recommend you follow-up with your urologist or your primary care physician within the next 30 days   Increase activity slowly    Complete by:  As directed    Increase activity slowly    Complete by:  As  directed      Current Discharge Medication List    START taking these medications   Details  ciprofloxacin (CIPRO) 500 MG tablet Take 1 tablet (500 mg total) by mouth 2 (two) times daily. Qty: 60 tablet, Refills: 0      CONTINUE these medications which have CHANGED   Details  gabapentin (NEURONTIN) 300 MG capsule Take 1 capsule (300 mg total) by mouth 3 (three) times daily.      CONTINUE these medications which have NOT CHANGED   Details  meloxicam (MOBIC) 15 MG tablet Take 15 mg by mouth daily as needed for pain. Refills: 1    pantoprazole (PROTONIX) 40 MG tablet TAKE 1 TABLET BY MOUTH TWICE DAILY Qty: 180 tablet, Refills: 0    oxyCODONE (OXY IR/ROXICODONE) 5 MG immediate release tablet Take 1-2 tablets (5-10 mg total) by mouth every 6 (six) hours as needed for moderate pain, severe pain or breakthrough pain. Qty: 40 tablet, Refills: 0      STOP taking these medications     amoxicillin-clavulanate (AUGMENTIN) 875-125 MG tablet        No Known Allergies    The results of significant diagnostics from this hospitalization (including imaging, microbiology, ancillary and laboratory) are listed below for reference.    Significant Diagnostic Studies: Ct Abdomen Pelvis W Contrast  Result Date: 12/25/2016 CLINICAL  DATA:  Fever, nausea, diarrhea, and abdominal pain for 6 days. EXAM: CT ABDOMEN AND PELVIS WITH CONTRAST TECHNIQUE: Multidetector CT imaging of the abdomen and pelvis was performed using the standard protocol following bolus administration of intravenous contrast. CONTRAST:  141mL ISOVUE-300 IOPAMIDOL (ISOVUE-300) INJECTION 61% COMPARISON:  09/05/2015 FINDINGS: Lower chest: Minimal dependent atelectasis in the right lung base. No pleural effusion. Hepatobiliary: Hepatic steatosis. Prior cholecystectomy. No biliary dilatation. Pancreas: Unremarkable. Spleen: Unremarkable. Adrenals/Urinary Tract: Unremarkable adrenal glands. No evidence of renal mass, calculi, or  hydronephrosis. Unremarkable bladder. Stomach/Bowel: The stomach is within normal limits. No bowel dilatation or wall thickening. Prior appendectomy. Vascular/Lymphatic: No significant vascular findings. No enlarged lymph nodes. Reproductive: Unremarkable prostate. Other: No intraperitoneal free fluid. No abdominal wall mass or hernia. Musculoskeletal: Mild disc degeneration at L2-3 with mild disc space narrowing, vacuum disc, mild disc bulging, and spurring. IMPRESSION: No acute abnormality identified in the abdomen or pelvis. Electronically Signed   By: Logan Bores M.D.   On: 12/25/2016 15:19   Dg Chest Port 1 View  Result Date: 12/25/2016 CLINICAL DATA:  Sepsis. EXAM: PORTABLE CHEST 1 VIEW COMPARISON:  01/18/2016 and prior exams FINDINGS: This is a mildly low volume film. The cardiomediastinal silhouette is unremarkable. There is no evidence of focal airspace disease, pulmonary edema, suspicious pulmonary nodule/mass, pleural effusion, or pneumothorax. No acute bony abnormalities are identified. IMPRESSION: No active disease. Electronically Signed   By: Margarette Canada M.D.   On: 12/25/2016 17:14   Ct Maxillofacial Wo Contrast  Result Date: 12/25/2016 CLINICAL DATA:  Sinus pain and congestion for 1 week. EXAM: CT MAXILLOFACIAL WITHOUT CONTRAST TECHNIQUE: Multidetector CT imaging of the maxillofacial structures was performed. Multiplanar CT image reconstructions were also generated. A small metallic BB was placed on the right temple in order to reliably differentiate right from left. COMPARISON:  None. FINDINGS: Osseous: No fracture or mandibular dislocation. No destructive process. Orbits: Negative. No traumatic or inflammatory finding. Sinuses: The paranasal sinuses, mastoid air cells and middle/ inner ears are clear. There is no evidence of sinusitis. The ostiomeatal complexes are unremarkable. Nasal septal deviation to the right is present. Soft tissues: Negative. Limited intracranial: No significant or  unexpected finding. IMPRESSION: No evidence of sinusitis. No acute or significant abnormality. Electronically Signed   By: Margarette Canada M.D.   On: 12/25/2016 17:52    Microbiology: Recent Results (from the past 240 hour(s))  Culture, Urine     Status: Abnormal   Collection Time: 12/25/16 12:26 PM  Result Value Ref Range Status   Specimen Description URINE, RANDOM  Final   Special Requests ADDED [id number]  Final   Culture >=100,000 COLONIES/mL ESCHERICHIA COLI (A)  Final   Report Status 12/28/2016 FINAL  Final   Organism ID, Bacteria ESCHERICHIA COLI (A)  Final      Susceptibility   Escherichia coli - MIC*    AMPICILLIN 4 SENSITIVE Sensitive     CEFAZOLIN <=4 SENSITIVE Sensitive     CEFTRIAXONE <=1 SENSITIVE Sensitive     CIPROFLOXACIN <=0.25 SENSITIVE Sensitive     GENTAMICIN <=1 SENSITIVE Sensitive     IMIPENEM <=0.25 SENSITIVE Sensitive     NITROFURANTOIN <=16 SENSITIVE Sensitive     TRIMETH/SULFA <=20 SENSITIVE Sensitive     AMPICILLIN/SULBACTAM <=2 SENSITIVE Sensitive     PIP/TAZO <=4 SENSITIVE Sensitive     Extended ESBL NEGATIVE Sensitive     * >=100,000 COLONIES/mL ESCHERICHIA COLI  Blood culture (routine x 2)     Status: None (Preliminary result)  Collection Time: 12/25/16 12:28 PM  Result Value Ref Range Status   Specimen Description BLOOD RIGHT ANTECUBITAL  Final   Special Requests   Final    BOTTLES DRAWN AEROBIC AND ANAEROBIC Blood Culture adequate volume   Culture NO GROWTH 3 DAYS  Final   Report Status PENDING  Incomplete  Blood culture (routine x 2)     Status: None (Preliminary result)   Collection Time: 12/25/16 12:30 PM  Result Value Ref Range Status   Specimen Description BLOOD RIGHT HAND  Final   Special Requests   Final    BOTTLES DRAWN AEROBIC AND ANAEROBIC Blood Culture adequate volume   Culture NO GROWTH 3 DAYS  Final   Report Status PENDING  Incomplete  Culture, blood (Routine X 2) w Reflex to ID Panel     Status: None (Preliminary result)    Collection Time: 12/26/16  5:24 AM  Result Value Ref Range Status   Specimen Description BLOOD LEFT ANTECUBITAL  Final   Special Requests   Final    BOTTLES DRAWN AEROBIC AND ANAEROBIC Blood Culture adequate volume   Culture NO GROWTH 2 DAYS  Final   Report Status PENDING  Incomplete  Culture, blood (Routine X 2) w Reflex to ID Panel     Status: None (Preliminary result)   Collection Time: 12/26/16  5:28 AM  Result Value Ref Range Status   Specimen Description BLOOD RIGHT HAND  Final   Special Requests IN PEDIATRIC BOTTLE Blood Culture adequate volume  Final   Culture NO GROWTH 2 DAYS  Final   Report Status PENDING  Incomplete     Labs: Basic Metabolic Panel:  Recent Labs Lab 12/25/16 1141 12/26/16 0524  NA 134* 136  K 4.2 4.0  CL 103 104  CO2 25 25  GLUCOSE 110* 96  BUN 9 7  CREATININE 0.92 0.87  CALCIUM 9.6 8.5*   Liver Function Tests:  Recent Labs Lab 12/26/16 0524  AST 27  ALT 52  ALKPHOS 65  BILITOT 0.9  PROT 6.0*  ALBUMIN 3.4*   No results for input(s): LIPASE, AMYLASE in the last 168 hours. No results for input(s): AMMONIA in the last 168 hours. CBC:  Recent Labs Lab 12/25/16 1141 12/25/16 1716 12/26/16 0524  WBC 18.6* 16.7* 10.5  NEUTROABS  --  13.6*  --   HGB 15.1 14.0 12.5*  HCT 44.6 42.1 38.3*  MCV 87.8 88.4 89.5  PLT 329 278 230   Cardiac Enzymes: No results for input(s): CKTOTAL, CKMB, CKMBINDEX, TROPONINI in the last 168 hours. BNP: BNP (last 3 results) No results for input(s): BNP in the last 8760 hours.  ProBNP (last 3 results) No results for input(s): PROBNP in the last 8760 hours.  CBG: No results for input(s): GLUCAP in the last 168 hours.   Signed:  Velvet Bathe MD.  [facility] 12/29/2016, 2:56 PM

## 2016-12-30 LAB — CULTURE, BLOOD (ROUTINE X 2)
CULTURE: NO GROWTH
CULTURE: NO GROWTH
SPECIAL REQUESTS: ADEQUATE
Special Requests: ADEQUATE

## 2016-12-31 LAB — CULTURE, BLOOD (ROUTINE X 2)
CULTURE: NO GROWTH
Culture: NO GROWTH
SPECIAL REQUESTS: ADEQUATE
Special Requests: ADEQUATE

## 2017-01-02 ENCOUNTER — Encounter: Payer: Self-pay | Admitting: Family

## 2017-01-02 ENCOUNTER — Ambulatory Visit (INDEPENDENT_AMBULATORY_CARE_PROVIDER_SITE_OTHER): Payer: Commercial Managed Care - HMO | Admitting: Family

## 2017-01-02 VITALS — BP 158/100 | HR 83 | Temp 98.1°F | Resp 18 | Ht 69.0 in | Wt 280.8 lb

## 2017-01-02 DIAGNOSIS — N410 Acute prostatitis: Secondary | ICD-10-CM

## 2017-01-02 DIAGNOSIS — G43109 Migraine with aura, not intractable, without status migrainosus: Secondary | ICD-10-CM

## 2017-01-02 MED ORDER — SUMATRIPTAN SUCCINATE 100 MG PO TABS: ORAL_TABLET | ORAL | 0 refills | Status: DC

## 2017-01-02 NOTE — Assessment & Plan Note (Signed)
Symptoms of prostatitis appear improving with no fevers and stable vital signs. Does continue to have issues with urination that are concerning for BPH. Encouraged to increase fluid intake to ensure adequate hydration. Continue current dosage of ciprofloxacin. Consider adding Flomax for BPH. Question possible urinary retention as contributing factor to prostatitis. Continue to monitor.

## 2017-01-02 NOTE — Assessment & Plan Note (Signed)
New problem of migraine headaches not currently maintained on medication. Denies worst headache of life with no symptoms of neck tenderness. Neurological exam is normal. Start Imitrex for severe migraines. Consider addition of propranolol or toparimate. Follow up pending trials of medication or if frequency or intensity increase.

## 2017-01-02 NOTE — Progress Notes (Signed)
Subjective:    Patient ID: Joshua Parrish, male    DOB: Oct 01, 1975, 41 y.o.   MRN: [id number]  Chief Complaint  Patient presents with  . Hospitalization Follow-up    has questions about prostatitis, wants to know how you get it and where do you go from here,     HPI:  Joshua Parrish is a 41 y.o. male who  has a past medical history of Chicken pox; Depression; Fatty liver; GERD (gastroesophageal reflux disease); Migraines; Osteoarthritis of shoulder; Rotator cuff impingement syndrome of left shoulder (06/19/2015); and Sinusitis. and presents today   1.) Prostatitis - Recently evaluated in the emergency department and admitted to the hospital with the chief complaint of fevers and bilateral flank pain starting approximately one week prior to presentation. His heart rate was noted to be 115 tachycardic and family history of being septic from prostatitis. Physical exam with tachycardia noted and mild suprapubic tenderness without rebound or guarding. There is also tenderness to the perineal area. Lab work showed a urinalysis that was significant for leukocytes and negative for nitrates with small hematuria. White blood cell count was noted to be 18.6. Sepsis and prostatitis resolved with IV antibiotics and blood cultures remaining negative. Consulted with urology who recommended 30 days of oral ciprofloxacin. All hospital records, labs, and imaging reviewed in detail.  Continues to experience the associated symptoms of kidney pain on occasion with some difficulty in urination.. Currently maintained on ciprofloxacin and reports taking the medication as prescribed and has some fatigue as a side effect. Denies any current urinary symptoms including dysuria, frequency or urgency. Denies fevers.  2.) Migraine - Continues to experience the associated symptoms of headaches described as throbbing, with associated sensitivity to light and sound with nausea without vomiting. [place] described as seeing floaters prior  to the onset of headache. Modifying factors include Nyquil and sleep which generally seem to help. No triggers that he is aware of. Frequency of the migraine type headaches are 3-4 times per month with some degree of headache on a daily basis.   No Known Allergies    Outpatient Medications Prior to Visit  Medication Sig Dispense Refill  . ciprofloxacin (CIPRO) 500 MG tablet Take 1 tablet (500 mg total) by mouth 2 (two) times daily. 60 tablet 0  . gabapentin (NEURONTIN) 300 MG capsule Take 1 capsule (300 mg total) by mouth 3 (three) times daily.    . meloxicam (MOBIC) 15 MG tablet Take 15 mg by mouth daily as needed for pain.  1  . pantoprazole (PROTONIX) 40 MG tablet TAKE 1 TABLET BY MOUTH TWICE DAILY 180 tablet 0  . oxyCODONE (OXY IR/ROXICODONE) 5 MG immediate release tablet Take 1-2 tablets (5-10 mg total) by mouth every 6 (six) hours as needed for moderate pain, severe pain or breakthrough pain. (Patient not taking: Reported on 12/25/2016) 40 tablet 0   No facility-administered medications prior to visit.       Past Surgical History:  Procedure Laterality Date  . APPENDECTOMY    . CERVICAL LAMINECTOMY     has plates and fusion  . CHOLECYSTECTOMY N/A 12/24/2015   Procedure: LAPAROSCOPIC CHOLECYSTECTOMY ;  Surgeon: [name] Pickerel, MD;  Location: [facility];  Service: General;  Laterality: N/A;  . CLAVICLE EXCISION Left   . HYPOSPADIAS CORRECTION     as child  . KNEE SURGERY Bilateral    multiple  . SHOULDER ARTHROSCOPY WITH ROTATOR CUFF REPAIR Left 06/26/2015   Procedure: SHOULDER ARTHROSCOPY WITH ROTATOR CUFF REPAIR;  Surgeon: Elsie Saas, MD;  Location: [place];  Service: Orthopedics;  Laterality: Left;      Past Medical History:  Diagnosis Date  . Chicken pox   . Depression   . Fatty liver   . GERD (gastroesophageal reflux disease)   . Migraines   . Osteoarthritis of shoulder    left  . Rotator cuff impingement syndrome of left shoulder 06/19/2015  .  Sinusitis       Review of Systems  Constitutional: Negative for chills and fever.  Cardiovascular: Negative for chest pain, palpitations and leg swelling.  Gastrointestinal: Negative for nausea and vomiting.  Genitourinary: Positive for difficulty urinating and flank pain. Negative for discharge, dysuria, frequency, hematuria, testicular pain and urgency.  Neurological: Positive for headaches. Negative for weakness.      Objective:    BP (!) 158/100 (BP Location: Left Arm, Patient Position: Sitting, Cuff Size: Large)   Pulse 83   Temp 98.1 F (36.7 C) (Oral)   Resp 18   Ht 5\' 9"  (1.753 m)   Wt 280 lb 12.8 oz (127.4 kg)   SpO2 98%   BMI 41.47 kg/m  Nursing note and vital signs reviewed.   Physical Exam  Constitutional: He is oriented to person, place, and time. He appears well-developed and well-nourished. No distress.  Eyes: Conjunctivae and EOM are normal. Pupils are equal, round, and reactive to light.  Cardiovascular: Normal rate, regular rhythm, normal heart sounds and intact distal pulses.   Pulmonary/Chest: Effort normal and breath sounds normal.  Abdominal: Normal appearance and bowel sounds are normal. He exhibits no mass. There is no hepatosplenomegaly. There is no tenderness. There is no rigidity, no rebound, no guarding, no CVA tenderness, no tenderness at McBurney's point and negative Murphy's sign.  Neurological: He is alert and oriented to person, place, and time. No cranial nerve deficit.  Skin: Skin is warm and dry.  Psychiatric: He has a normal mood and affect. His behavior is normal. Judgment and thought content normal.       Assessment & Plan:   Problem List Items Addressed This Visit      Cardiovascular and Mediastinum   Migraine with aura and without status migrainosus, not intractable    New problem of migraine headaches not currently maintained on medication. Denies worst headache of life with no symptoms of neck tenderness. Neurological exam is  normal. Start Imitrex for severe migraines. Consider addition of propranolol or toparimate. Follow up pending trials of medication or if frequency or intensity increase.       Relevant Medications   SUMAtriptan (IMITREX) 100 MG tablet     Genitourinary   Prostatitis - Primary    Symptoms of prostatitis appear improving with no fevers and stable vital signs. Does continue to have issues with urination that are concerning for BPH. Encouraged to increase fluid intake to ensure adequate hydration. Continue current dosage of ciprofloxacin. Consider adding Flomax for BPH. Question possible urinary retention as contributing factor to prostatitis. Continue to monitor.           I have discontinued Mr. Mikels's oxyCODONE. I am also having him start on SUMAtriptan. Additionally, I am having him maintain his pantoprazole, meloxicam, gabapentin, and ciprofloxacin.   Meds ordered this encounter  Medications  . SUMAtriptan (IMITREX) 100 MG tablet    Sig: Take 0.5-1 tablet by mouth daily as needed for headache. May repeat in 2 hours if headache persists or recurs.    Dispense:  10 tablet  Refill:  0    Order Specific Question:   Supervising Provider    Answer:   Pricilla Holm A [[id number]]     Follow-up: Return in about 2 weeks (around 01/16/2017), or if symptoms worsen or fail to improve.  Mauricio Po, FNP

## 2017-01-02 NOTE — Patient Instructions (Addendum)
Thank you for choosing [facility].  SUMMARY AND INSTRUCTIONS:  Continue to take the Ciprofloxacin.   Start the imitrex as needed for headaches.   Monitor your blood pressures at home at different times throughout the day and bring with you to your next office visit.   Follow up in 2 weeks.   Medication:  Your prescription(s) have been submitted to your pharmacy or been printed and provided for you. Please take as directed and contact our office if you believe you are having problem(s) with the medication(s) or have any questions.   Follow up:  If your symptoms worsen or fail to improve, please contact our office for further instruction, or in case of emergency go directly to the emergency room at the closest medical facility.     Prostatitis Prostatitis is swelling or inflammation of the prostate gland. The prostate is a walnut-sized gland that is involved in the production of semen. It is located below a man's bladder, in front of the rectum. There are four types of prostatitis:  Chronic nonbacterial prostatitis. This is the most common type of prostatitis. It may be associated with a viral infection or autoimmune disorder.  Acute bacterial prostatitis. This is the least common type of prostatitis. It starts quickly and is usually associated with a bladder infection, high fever, and shaking chills. It can occur at any age.  Chronic bacterial prostatitis. This type usually results from acute bacterial prostatitis that happens repeatedly (is recurrent) or has not been treated properly. It can occur in men of any age but is most common among middle-aged men whose prostate has begun to get larger. The symptoms are not as severe as symptoms caused by acute bacterial prostatitis.  Prostatodynia or chronic pelvic pain syndrome (CPPS). This type is also called pelvic floor disorder. It is associated with increased muscular tone in the pelvis surrounding the prostate. What are the  causes? Bacterial prostatitis is caused by infection from bacteria. Chronic nonbacterial prostatitis may be caused by:  Urinary tract infections (UTIs).  Nerve damage.  A response by the body's disease-fighting system (autoimmune response).  Chemicals in the urine. The causes of the other types of prostatitis are usually not known. What are the signs or symptoms? Symptoms of this condition vary depending upon the type of prostatitis. If you have acute bacterial prostatitis, you may experience:  Urinary symptoms, such as:  Painful urination.  Burning during urination.  Frequent and sudden urges to urinate.  Inability to start urinating.  A weak or interrupted stream of urine.  Vomiting.  Nausea.  Fever.  Chills.  Inability to empty the bladder completely.  Pain in the:  Muscles or joints.  Lower back.  Lower abdomen. If you have any of the other types of prostatitis, you may experience:  Urinary symptoms, such as:  Sudden urges to urinate.  Frequent urination.  Difficulty starting urination.  Weak urine stream.  Dribbling after urination.  Discharge from the urethra. The urethra is a tube that opens at the end of the penis.  Pain in the:  Testicles.  Penis or tip of the penis.  Rectum.  Area in front of the rectum and below the scrotum (perineum).  Problems with sexual function.  Painful ejaculation.  Bloody semen. How is this diagnosed? This condition may be diagnosed based on:  A physical and medical exam.  Your symptoms.  A urine test to check for bacteria.  An exam in which a health care provider uses a finger to feel  the prostate (digital rectal exam).  A test of a sample of semen.  Blood tests.  Ultrasound.  Removal of prostate tissue to be examined under a microscope (biopsy).  Tests to check how your body handles urine (urodynamic tests).  A test to look inside your bladder or urethra (cystoscopy). How is this  treated? Treatment for this condition depends on the type of prostatitis. Treatment may involve:  Medicines to relieve pain or inflammation.  Medicines to help relax your muscles.  Physical therapy.  Heat therapy.  Techniques to help you control certain body functions (biofeedback).  Relaxation exercises.  Antibiotic medicine, if your condition is caused by bacteria.  Warm water baths (sitz baths). Sitz baths help with relaxing your pelvic floor muscles, which helps to relieve pressure on the prostate. Follow these instructions at home:  Take over-the-counter and prescription medicines only as told by your health care provider.  If you were prescribed an antibiotic, take it as told by your health care provider. Do not stop taking the antibiotic even if you start to feel better.  If physical therapy, biofeedback, or relaxation exercises were prescribed, do exercises as instructed.  Take sitz baths as directed by your health care provider. For a sitz bath, sit in warm water that is deep enough to cover your hips and buttocks.  Keep all follow-up visits as told by your health care provider. This is important. Contact a health care provider if:  Your symptoms get worse.  You have a fever. Get help right away if:  You have chills.  You feel nauseous.  You vomit.  You feel light-headed or feel like you are going to faint.  You are unable to urinate.  You have blood or blood clots in your urine. This information is not intended to replace advice given to you by your health care provider. Make sure you discuss any questions you have with your health care provider. Document Released: 08/15/2000 Document Revised: 05/08/2016 Document Reviewed: 05/08/2016 Elsevier Interactive Patient Education  2017 [place].  Topiramate tablets What is this medicine? TOPIRAMATE (toe PYRE a mate) is used to treat seizures in adults or children with epilepsy. It is also used for the  prevention of migraine headaches. This medicine may be used for other purposes; ask your health care provider or pharmacist if you have questions. COMMON BRAND NAME(S): Topamax, Topiragen What should I tell my health care provider before I take this medicine? They need to know if you have any of these conditions: -bleeding disorders -cirrhosis of the liver or liver disease -diarrhea -glaucoma -kidney stones or kidney disease -low blood counts, like low white cell, platelet, or red cell counts -lung disease like asthma, obstructive pulmonary disease, emphysema -metabolic acidosis -on a ketogenic diet -schedule for surgery or a procedure -suicidal thoughts, plans, or attempt; a previous suicide attempt by you or a family member -an unusual or allergic reaction to topiramate, other medicines, foods, dyes, or preservatives -pregnant or trying to get pregnant -breast-feeding How should I use this medicine? Take this medicine by mouth with a glass of water. Follow the directions on the prescription label. Do not crush or chew. You may take this medicine with meals. Take your medicine at regular intervals. Do not take it more often than directed. Talk to your pediatrician regarding the use of this medicine in children. Special care may be needed. While this drug may be prescribed for children as young as 20 years of age for selected conditions, precautions do apply.  Overdosage: If you think you have taken too much of this medicine contact a poison control center or emergency room at once. NOTE: This medicine is only for you. Do not share this medicine with others. What if I miss a dose? If you miss a dose, take it as soon as you can. If your next dose is to be taken in less than 6 hours, then do not take the missed dose. Take the next dose at your regular time. Do not take double or extra doses. What may interact with this medicine? Do not take this medicine with any of the following  medications: -probenecid This medicine may also interact with the following medications: -acetazolamide -alcohol -amitriptyline -aspirin and aspirin-like medicines -birth control pills -certain medicines for depression -certain medicines for seizures -certain medicines that treat or prevent blood clots like warfarin, enoxaparin, dalteparin, apixaban, dabigatran, and rivaroxaban -digoxin -hydrochlorothiazide -lithium -medicines for pain, sleep, or muscle relaxation -metformin -methazolamide -NSAIDS, medicines for pain and inflammation, like ibuprofen or naproxen -pioglitazone -risperidone This list may not describe all possible interactions. Give your health care provider a list of all the medicines, herbs, non-prescription drugs, or dietary supplements you use. Also tell them if you smoke, drink alcohol, or use illegal drugs. Some items may interact with your medicine. What should I watch for while using this medicine? Visit your doctor or health care professional for regular checks on your progress. Do not stop taking this medicine suddenly. This increases the risk of seizures if you are using this medicine to control epilepsy. Wear a medical identification bracelet or chain to say you have epilepsy or seizures, and carry a card that lists all your medicines. This medicine can decrease sweating and increase your body temperature. Watch for signs of deceased sweating or fever, especially in children. Avoid extreme heat, hot baths, and saunas. Be careful about exercising, especially in hot weather. Contact your health care provider right away if you notice a fever or decrease in sweating. You should drink plenty of fluids while taking this medicine. If you have had kidney stones in the past, this will help to reduce your chances of forming kidney stones. If you have stomach pain, with nausea or vomiting and yellowing of your eyes or skin, call your doctor immediately. You may get drowsy,  dizzy, or have blurred vision. Do not drive, use machinery, or do anything that needs mental alertness until you know how this medicine affects you. To reduce dizziness, do not sit or stand up quickly, especially if you are an older patient. Alcohol can increase drowsiness and dizziness. Avoid alcoholic drinks. If you notice blurred vision, eye pain, or other eye problems, seek medical attention at once for an eye exam. The use of this medicine may increase the chance of suicidal thoughts or actions. Pay special attention to how you are responding while on this medicine. Any worsening of mood, or thoughts of suicide or dying should be reported to your health care professional right away. This medicine may increase the chance of developing metabolic acidosis. If left untreated, this can cause kidney stones, bone disease, or slowed growth in children. Symptoms include breathing fast, fatigue, loss of appetite, irregular heartbeat, or loss of consciousness. Call your doctor immediately if you experience any of these side effects. Also, tell your doctor about any surgery you plan on having while taking this medicine since this may increase your risk for metabolic acidosis. Birth control pills may not work properly while you are taking this medicine.  Talk to your doctor about using an extra method of birth control. Women who become pregnant while using this medicine may enroll in the [place] Pregnancy Registry by calling [phone]. This registry collects information about the safety of antiepileptic drug use during pregnancy. What side effects may I notice from receiving this medicine? Side effects that you should report to your doctor or health care professional as soon as possible: -allergic reactions like skin rash, itching or hives, swelling of the face, lips, or tongue -decreased sweating and/or rise in body temperature -depression -difficulty breathing, fast or irregular  breathing patterns -difficulty speaking -difficulty walking or controlling muscle movements -hearing impairment -redness, blistering, peeling or loosening of the skin, including inside the mouth -tingling, pain or numbness in the hands or feet -unusual bleeding or bruising -unusually weak or tired -worsening of mood, thoughts or actions of suicide or dying Side effects that usually do not require medical attention (report to your doctor or health care professional if they continue or are bothersome): -altered taste -back pain, joint or muscle aches and pains -diarrhea, or constipation -headache -loss of appetite -nausea -stomach upset, indigestion -tremors This list may not describe all possible side effects. Call your doctor for medical advice about side effects. You may report side effects to FDA at 1-800-FDA-1088. Where should I keep my medicine? Keep out of the reach of children. Store at room temperature between 15 and 30 degrees C (59 and 86 degrees F) in a tightly closed container. Protect from moisture. Throw away any unused medicine after the expiration date. NOTE: This sheet is a summary. It may not cover all possible information. If you have questions about this medicine, talk to your doctor, pharmacist, or health care provider.  2018 Elsevier/Gold Standard (2013-08-22 23:17:57)  Propranolol extended-release capsules What is this medicine? PROPRANOLOL (proe PRAN oh lole) is a beta-blocker. Beta-blockers reduce the workload on the heart and help it to beat more regularly. This medicine is used to treat high blood pressure, heart muscle disease, and prevent chest pain caused by angina. It is also used to prevent migraine headaches. You should not use this medicine to treat a migraine that has already started. This medicine may be used for other purposes; ask your health care provider or pharmacist if you have questions. COMMON BRAND NAME(S): Inderal LA, Inderal XL, InnoPran  XL What should I tell my health care provider before I take this medicine? They need to know if you have any of these conditions: -circulation problems, or blood vessel disease -diabetes -history of heart attack or heart disease, vasospastic angina -kidney disease -liver disease -lung or breathing disease, like asthma or emphysema -pheochromocytoma -slow heart rate -thyroid disease -an unusual or allergic reaction to propranolol, other beta-blockers, medicines, foods, dyes, or preservatives -pregnant or trying to get pregnant -breast-feeding How should I use this medicine? Take this medicine by mouth with a glass of water. Follow the directions on the prescription label. Do not crush or chew. Take your doses at regular intervals. Do not take your medicine more often than directed. Do not stop taking except on the advice of your doctor or health care professional. Talk to your pediatrician regarding the use of this medicine in children. Special care may be needed. Overdosage: If you think you have taken too much of this medicine contact a poison control center or emergency room at once. NOTE: This medicine is only for you. Do not share this medicine with others. What if I miss a  dose? If you miss a dose, take it as soon as you can. If it is almost time for your next dose, take only that dose. Do not take double or extra doses. What may interact with this medicine? Do not take this medicine with any of the following medications: -feverfew -phenothiazines like chlorpromazine, mesoridazine, prochlorperazine, thioridazine This medicine may also interact with the following medications: -aluminum hydroxide gel -antipyrine -antiviral medicines for HIV or AIDS -barbiturates like phenobarbital -certain medicines for blood pressure, heart disease, irregular heart beat -cimetidine -ciprofloxacin -diazepam -fluconazole -haloperidol -isoniazid -medicines for cholesterol like cholestyramine or  colestipol -medicines for mental depression -medicines for migraine headache like almotriptan, eletriptan, frovatriptan, naratriptan, rizatriptan, sumatriptan, zolmitriptan -NSAIDs, medicines for pain and inflammation, like ibuprofen or naproxen -phenytoin -rifampin -teniposide -theophylline -thyroid medicines -tolbutamide -warfarin -zileuton This list may not describe all possible interactions. Give your health care provider a list of all the medicines, herbs, non-prescription drugs, or dietary supplements you use. Also tell them if you smoke, drink alcohol, or use illegal drugs. Some items may interact with your medicine. What should I watch for while using this medicine? Visit your doctor or health care professional for regular check ups. Contact your doctor right away if your symptoms worsen. Check your blood pressure and pulse rate regularly. Ask your health care professional what your blood pressure and pulse rate should be, and when you should contact them. Do not stop taking this medicine suddenly. This could lead to serious heart-related effects. You may get drowsy or dizzy. Do not drive, use machinery, or do anything that needs mental alertness until you know how this drug affects you. Do not stand or sit up quickly, especially if you are an older patient. This reduces the risk of dizzy or fainting spells. Alcohol can make you more drowsy and dizzy. Avoid alcoholic drinks. This medicine can affect blood sugar levels. If you have diabetes, check with your doctor or health care professional before you change your diet or the dose of your diabetic medicine. Do not treat yourself for coughs, colds, or pain while you are taking this medicine without asking your doctor or health care professional for advice. Some ingredients may increase your blood pressure. What side effects may I notice from receiving this medicine? Side effects that you should report to your doctor or health care  professional as soon as possible: -allergic reactions like skin rash, itching or hives, swelling of the face, lips, or tongue -breathing problems -changes in blood sugar -cold hands or feet -difficulty sleeping, nightmares -dry peeling skin -hallucinations -muscle cramps or weakness -slow heart rate -swelling of the legs and ankles -vomiting Side effects that usually do not require medical attention (report to your doctor or health care professional if they continue or are bothersome): -change in sex drive or performance -diarrhea -dry sore eyes -hair loss -nausea -weak or tired This list may not describe all possible side effects. Call your doctor for medical advice about side effects. You may report side effects to FDA at 1-800-FDA-1088. Where should I keep my medicine? Keep out of the reach of children. Store at room temperature between 15 and 30 degrees C (59 and 86 degrees F). Protect from light, moisture and freezing. Keep container tightly closed. Throw away any unused medicine after the expiration date. NOTE: This sheet is a summary. It may not cover all possible information. If you have questions about this medicine, talk to your doctor, pharmacist, or health care provider.  2018 Elsevier/Gold Standard (2013-04-22 14:58:56)

## 2017-01-07 ENCOUNTER — Telehealth: Payer: Self-pay

## 2017-01-07 NOTE — Telephone Encounter (Signed)
Called wifes number and LVM letting her know that FMLA paperwork that was dropped off at patients visit is ready for pick up. Also called husband and LVM letting him know as well.

## 2017-01-09 DIAGNOSIS — Z0279 Encounter for issue of other medical certificate: Secondary | ICD-10-CM

## 2017-01-09 NOTE — Telephone Encounter (Signed)
FMLA has been sent to scan, picked up and charged for.

## 2017-01-16 ENCOUNTER — Ambulatory Visit (INDEPENDENT_AMBULATORY_CARE_PROVIDER_SITE_OTHER): Payer: Commercial Managed Care - HMO | Admitting: Family

## 2017-01-16 ENCOUNTER — Encounter: Payer: Self-pay | Admitting: Family

## 2017-01-16 ENCOUNTER — Other Ambulatory Visit (INDEPENDENT_AMBULATORY_CARE_PROVIDER_SITE_OTHER): Payer: Commercial Managed Care - HMO

## 2017-01-16 VITALS — BP 132/90 | HR 75 | Temp 98.6°F | Resp 16 | Ht 69.0 in | Wt 285.0 lb

## 2017-01-16 DIAGNOSIS — N410 Acute prostatitis: Secondary | ICD-10-CM

## 2017-01-16 DIAGNOSIS — G43109 Migraine with aura, not intractable, without status migrainosus: Secondary | ICD-10-CM

## 2017-01-16 DIAGNOSIS — I10 Essential (primary) hypertension: Secondary | ICD-10-CM

## 2017-01-16 DIAGNOSIS — R234 Changes in skin texture: Secondary | ICD-10-CM | POA: Diagnosis not present

## 2017-01-16 DIAGNOSIS — R239 Unspecified skin changes: Secondary | ICD-10-CM

## 2017-01-16 LAB — PSA: PSA: 2 ng/mL (ref 0.10–4.00)

## 2017-01-16 MED ORDER — TOPIRAMATE 25 MG PO TABS: 25.0000 mg | ORAL_TABLET | Freq: Every evening | ORAL | 0 refills | Status: DC

## 2017-01-16 MED ORDER — LISINOPRIL-HYDROCHLOROTHIAZIDE 20-12.5 MG PO TABS: 1.0000 | ORAL_TABLET | Freq: Every day | ORAL | 2 refills | Status: DC

## 2017-01-16 NOTE — Patient Instructions (Signed)
Thank you for choosing [facility].  SUMMARY AND INSTRUCTIONS:  Please start taking the topamax in evening.  Start the lisinopril-hctz. Continue to monitor your blood pressure at home.   They will call with referral to dermatology.  Continue to monitor your urinary symptoms.  Medication:  Your prescription(s) have been submitted to your pharmacy or been printed and provided for you. Please take as directed and contact our office if you believe you are having problem(s) with the medication(s) or have any questions.  Follow up:  If your symptoms worsen or fail to improve, please contact our office for further instruction, or in case of emergency go directly to the emergency room at the closest medical facility.

## 2017-01-16 NOTE — Assessment & Plan Note (Signed)
Migraine headaches improved with starting Imitrex and no adverse side effects. Neurological exam normal. Start Topamax for prophylactic treatment. Continue current dosage of sumatriptan. Continue to monitor.

## 2017-01-16 NOTE — Progress Notes (Signed)
Subjective:    Patient ID: Joshua Parrish, male    DOB: [birth year], 41 y.o.   MRN: [id number]  Chief Complaint  Patient presents with  . Follow-up    PSA, Blood pressure    HPI:  Joshua Parrish is a 41 y.o. male who  has a past medical history of Chicken pox; Depression; Fatty liver; GERD (gastroesophageal reflux disease); Migraines; Osteoarthritis of shoulder; Rotator cuff impingement syndrome of left shoulder (06/19/2015); and Sinusitis. and presents today for a follow up office visit.  1.) Prostatitis - Recently evaluated in the office and diagnosed with prostatitis and started on Ciprofloxacin. Reports taking the medication and notes that he has symptoms that feel like he is having chest pain. Has completed several days of the medication and noted some symptom improvement. Does continue to have occasional dysuria that has improved. No fevers or abdominal pain.   2.) Blood pressure - Not currently maintained on medication. Notes home blood pressures have been labile and remained elevated above goal of 140/90. Does continue to have headaches, but denies other symptoms of end organ damage.  BP Readings from Last 3 Encounters:  01/16/17 132/90  01/02/17 (!) 158/100  12/29/16 (!) 146/92   3.) Migraines - Improved with starting Imitrex. Continues to experience the frequency of headaches. Denies worst headache of life or neck pain. Would like to start Toparimate.    No Known Allergies    Outpatient Medications Prior to Visit  Medication Sig Dispense Refill  . gabapentin (NEURONTIN) 300 MG capsule Take 1 capsule (300 mg total) by mouth 3 (three) times daily.    . meloxicam (MOBIC) 15 MG tablet Take 15 mg by mouth daily as needed for pain.  1  . pantoprazole (PROTONIX) 40 MG tablet TAKE 1 TABLET BY MOUTH TWICE DAILY 180 tablet 0  . SUMAtriptan (IMITREX) 100 MG tablet Take 0.5-1 tablet by mouth daily as needed for headache. May repeat in 2 hours if headache persists or recurs. 10 tablet 0  .  ciprofloxacin (CIPRO) 500 MG tablet Take 1 tablet (500 mg total) by mouth 2 (two) times daily. 60 tablet 0   No facility-administered medications prior to visit.       Past Surgical History:  Procedure Laterality Date  . APPENDECTOMY    . CERVICAL LAMINECTOMY     has plates and fusion  . CHOLECYSTECTOMY N/A 12/24/2015   Procedure: LAPAROSCOPIC CHOLECYSTECTOMY ;  Surgeon: [name] Pickerel, MD;  Location: [facility];  Service: General;  Laterality: N/A;  . CLAVICLE EXCISION Left   . HYPOSPADIAS CORRECTION     as child  . KNEE SURGERY Bilateral    multiple  . SHOULDER ARTHROSCOPY WITH ROTATOR CUFF REPAIR Left 06/26/2015   Procedure: SHOULDER ARTHROSCOPY WITH ROTATOR CUFF REPAIR;  Surgeon: Elsie Saas, MD;  Location: [facility];  Service: Orthopedics;  Laterality: Left;      Past Medical History:  Diagnosis Date  . Chicken pox   . Depression   . Fatty liver   . GERD (gastroesophageal reflux disease)   . Migraines   . Osteoarthritis of shoulder    left  . Rotator cuff impingement syndrome of left shoulder 06/19/2015  . Sinusitis       Review of Systems  Constitutional: Negative for chills and fever.  Eyes:       Negative for changes in vision  Respiratory: Negative for cough, chest tightness, shortness of breath and wheezing.   Cardiovascular: Negative for chest pain, palpitations and leg  swelling.  Neurological: Negative for dizziness, weakness and light-headedness.      Objective:    BP 132/90 (BP Location: Left Arm, Patient Position: Sitting, Cuff Size: Large)   Pulse 75   Temp 98.6 F (37 C) (Oral)   Resp 16   Ht 5\' 9"  (1.753 m)   Wt 285 lb (129.3 kg)   SpO2 97%   BMI 42.09 kg/m  Nursing note and vital signs reviewed.  Physical Exam  Constitutional: He is oriented to person, place, and time. He appears well-developed and well-nourished. No distress.  Eyes: Conjunctivae and EOM are normal. Pupils are equal, round, and reactive to light.    Cardiovascular: Normal rate, regular rhythm and intact distal pulses.  Exam reveals no gallop and no friction rub.   No murmur heard. Pulmonary/Chest: Effort normal and breath sounds normal. No respiratory distress. He has no wheezes. He has no rales. He exhibits no tenderness.  Abdominal: There is no CVA tenderness.  Neurological: He is alert and oriented to person, place, and time.  Skin: Skin is warm and dry.  Psychiatric: He has a normal mood and affect. His behavior is normal. Judgment and thought content normal.       Assessment & Plan:   Problem List Items Addressed This Visit      Cardiovascular and Mediastinum   Migraine with aura and without status migrainosus, not intractable - Primary    Migraine headaches improved with starting Imitrex and no adverse side effects. Neurological exam normal. Start Topamax for prophylactic treatment. Continue current dosage of sumatriptan. Continue to monitor.      Relevant Medications   topiramate (TOPAMAX) 25 MG tablet   lisinopril-hydrochlorothiazide (PRINZIDE,ZESTORETIC) 20-12.5 MG tablet   Hypertension    Multiple blood pressure readings above 140/90 with lifestyle management consistent with essential hypertension. Denies worse headache of life with no symptoms of end organ damage noted on physical exam. Start lisinopril-hydrochlorothiazide. Encouraged follow a low-sodium diet. Monitor blood pressure at home and record values to bring to next office visit.      Relevant Medications   lisinopril-hydrochlorothiazide (PRINZIDE,ZESTORETIC) 20-12.5 MG tablet     Genitourinary   Prostatitis    Symptoms appear resolved with several doses of ciprofloxacin and occasional dysuria remains but infrequent. Obtain PSA. No current urinary symptoms. Continue to monitor. If symptoms worsen consider adding Bactrim.      Relevant Orders   PSA (Completed)     Other   Recent skin changes    Has noted several skin changes recently and would like to  be referred for dermatology exam. Referral placed.      Relevant Orders   Ambulatory referral to Dermatology       I have discontinued Mr. Bremer's ciprofloxacin. I am also having him start on topiramate and lisinopril-hydrochlorothiazide. Additionally, I am having him maintain his pantoprazole, meloxicam, gabapentin, and SUMAtriptan.   Meds ordered this encounter  Medications  . topiramate (TOPAMAX) 25 MG tablet    Sig: Take 1 tablet (25 mg total) by mouth every evening.    Dispense:  30 tablet    Refill:  0    Order Specific Question:   Supervising Provider    Answer:   Pricilla Holm A [[id number]]  . lisinopril-hydrochlorothiazide (PRINZIDE,ZESTORETIC) 20-12.5 MG tablet    Sig: Take 1 tablet by mouth daily.    Dispense:  30 tablet    Refill:  2    Order Specific Question:   Supervising Provider    Answer:   CRAWFORD,  ELIZABETH A [[id number]]     Follow-up: Return in about 2 months (around 03/18/2017), or if symptoms worsen or fail to improve.  Mauricio Po, FNP

## 2017-01-16 NOTE — Assessment & Plan Note (Signed)
Multiple blood pressure readings above 140/90 with lifestyle management consistent with essential hypertension. Denies worse headache of life with no symptoms of end organ damage noted on physical exam. Start lisinopril-hydrochlorothiazide. Encouraged follow a low-sodium diet. Monitor blood pressure at home and record values to bring to next office visit.

## 2017-01-16 NOTE — Assessment & Plan Note (Signed)
Symptoms appear resolved with several doses of ciprofloxacin and occasional dysuria remains but infrequent. Obtain PSA. No current urinary symptoms. Continue to monitor. If symptoms worsen consider adding Bactrim.

## 2017-01-16 NOTE — Assessment & Plan Note (Signed)
Has noted several skin changes recently and would like to be referred for dermatology exam. Referral placed.

## 2017-01-30 ENCOUNTER — Telehealth: Payer: Self-pay

## 2017-01-30 DIAGNOSIS — R300 Dysuria: Secondary | ICD-10-CM

## 2017-01-30 NOTE — Telephone Encounter (Signed)
Received VM that pt is having problems with dysuria again. Was told to call if these symptoms reoccurred. Please advise

## 2017-02-02 NOTE — Telephone Encounter (Signed)
LVM letting pt know.  

## 2017-02-02 NOTE — Telephone Encounter (Signed)
UA placed for patient to complete

## 2017-02-05 ENCOUNTER — Other Ambulatory Visit (INDEPENDENT_AMBULATORY_CARE_PROVIDER_SITE_OTHER): Payer: Commercial Managed Care - HMO

## 2017-02-05 DIAGNOSIS — R300 Dysuria: Secondary | ICD-10-CM | POA: Diagnosis not present

## 2017-02-05 LAB — URINALYSIS
BILIRUBIN URINE: NEGATIVE
KETONES UR: NEGATIVE
LEUKOCYTES UA: NEGATIVE
Nitrite: NEGATIVE
Specific Gravity, Urine: >=1.03 — AB (ref 1.000–1.030)
Total Protein, Urine: NEGATIVE
UROBILINOGEN UA: 0.2 (ref 0.0–1.0)
Urine Glucose: NEGATIVE
pH: 5.5 (ref 5.0–8.0)

## 2017-02-06 DIAGNOSIS — B360 Pityriasis versicolor: Secondary | ICD-10-CM | POA: Diagnosis not present

## 2017-02-06 DIAGNOSIS — D225 Melanocytic nevi of trunk: Secondary | ICD-10-CM | POA: Diagnosis not present

## 2017-02-06 DIAGNOSIS — L910 Hypertrophic scar: Secondary | ICD-10-CM | POA: Diagnosis not present

## 2017-02-09 ENCOUNTER — Other Ambulatory Visit: Payer: Self-pay | Admitting: Family

## 2017-02-09 DIAGNOSIS — R350 Frequency of micturition: Secondary | ICD-10-CM | POA: Insufficient documentation

## 2017-02-09 MED ORDER — CIPROFLOXACIN HCL 500 MG PO TABS: 500.0000 mg | ORAL_TABLET | Freq: Two times a day (BID) | ORAL | 0 refills | Status: DC

## 2017-02-09 NOTE — Progress Notes (Signed)
Ciprofloxacin sent to pharmacy

## 2017-05-22 ENCOUNTER — Ambulatory Visit (INDEPENDENT_AMBULATORY_CARE_PROVIDER_SITE_OTHER): Payer: 59 | Admitting: Family

## 2017-05-22 ENCOUNTER — Ambulatory Visit (INDEPENDENT_AMBULATORY_CARE_PROVIDER_SITE_OTHER)
Admission: RE | Admit: 2017-05-22 | Discharge: 2017-05-22 | Disposition: A | Discharge status: Home / Self Care | Payer: 59 | Source: Ambulatory Visit | Attending: Family | Admitting: Family

## 2017-05-22 ENCOUNTER — Encounter: Payer: Self-pay | Admitting: Family

## 2017-05-22 VITALS — BP 180/122 | HR 103 | Temp 99.4°F | Resp 16 | Ht 69.0 in | Wt 287.0 lb

## 2017-05-22 DIAGNOSIS — G8929 Other chronic pain: Secondary | ICD-10-CM

## 2017-05-22 DIAGNOSIS — I10 Essential (primary) hypertension: Secondary | ICD-10-CM

## 2017-05-22 DIAGNOSIS — M25511 Pain in right shoulder: Secondary | ICD-10-CM | POA: Diagnosis not present

## 2017-05-22 DIAGNOSIS — M25562 Pain in left knee: Secondary | ICD-10-CM | POA: Diagnosis not present

## 2017-05-22 DIAGNOSIS — M25512 Pain in left shoulder: Secondary | ICD-10-CM

## 2017-05-22 MED ORDER — LISINOPRIL-HYDROCHLOROTHIAZIDE 20-12.5 MG PO TABS: 1.0000 | ORAL_TABLET | Freq: Every day | ORAL | 1 refills | Status: DC

## 2017-05-22 MED ORDER — MELOXICAM 15 MG PO TABS: 15.0000 mg | ORAL_TABLET | Freq: Every day | ORAL | 0 refills | Status: DC | PRN

## 2017-05-22 MED ORDER — PREDNISONE 10 MG (21) PO TBPK: ORAL_TABLET | ORAL | 0 refills | Status: DC

## 2017-05-22 NOTE — Patient Instructions (Addendum)
Thank you for choosing [facility].  SUMMARY AND INSTRUCTIONS:  Ice x 20 minutes every other hour and after work.   Stretches and exercises daily.  Recommend follow up with Dr. Archie Endo office for further evaluation.  Knee x-ray today.  Medication:  Your prescription(s) have been submitted to your pharmacy or been printed and provided for you. Please take as directed and contact our office if you believe you are having problem(s) with the medication(s) or have any questions.  Follow up:  If your symptoms worsen or fail to improve, please contact our office for further instruction, or in case of emergency go directly to the emergency room at the closest medical facility.    Meniscus Tear, Phase I Rehab After Surgery Ask your health care provider which exercises are safe for you. Do exercises exactly as told by your health care provider and adjust them as directed. It is normal to feel mild stretching, pulling, tightness, or discomfort as you do these exercises, but you should stop right away if you feel sudden pain or your pain gets worse.Do not begin these exercises until told by your health care provider. If told by your health care provider, wear your brace while you do these exercises. Stretching and range of motion exercises These exercises warm up your muscles and joints and improve the movement and flexibility of your knee. These exercises also help to relieve pain and stiffness. Exercise A: Knee flexion, passive (supine)  1. Start this exercise in one of these positions: ? Lying on the floor in front of an open doorway, with your left / right heel and foot lightly touching the wall. ? Lying on a bed with both of your feet on the wall or headboard. 2. Without using any effort, allow gravity to slide your foot down the wall slowly until you feel a gentle stretch in the front of your left / right knee, or until your knee reaches the angle that your health care provider tells  you. 3. Hold this stretch for __________ seconds. 4. Return your leg to the starting position, using your healthy leg to do the work or to help if needed. Repeat __________ times. Complete this exercise __________ times a day. Exercise B: Knee extension, passive (sitting)  1. Sit with your left / right heel propped up on a chair, a coffee table, or a footstool. Do not have anything under your knee to support it. 2. Allow your leg muscles to relax, letting gravity straighten out your knee. You should feel a stretch behind your left / right knee. 3. If told by your health care provider, deepen the stretch by placing a __________ weight on your thigh, just above your kneecap. 4. Hold this position for __________ seconds. Repeat __________ times. Complete this exercise __________ times a day. Exercise C: Gastrocnemius stretch, passive  1. Sit on the floor with your left / right leg extended. 2. Loop a belt or towel around the ball of your left / right foot. The ball of your foot is on the walking surface, right under your toes. 3. Keep your left / right ankle and foot relaxed and keep your knee straight while you use the belt or towel to pull your foot and ankle toward you. You should feel a gentle stretch in your calf or behind your knee. 4. Hold this position for __________ seconds. Repeat __________ times. Complete this exercise __________ times a day. Strengthening exercises These exercises build strength and endurance in your knee. Endurance is  the ability to use your muscles for a long time, even after they get tired. Exercise D: Quadriceps, isometric  1. Lie on your back with your left / right leg extended and your opposite knee bent. 2. Slowly tense the muscles in the front of your left / right thigh. You should see your kneecap slide up toward your hip or see increased dimpling just above the knee. This motion will push the back of the knee toward the ground. 3. Hold the muscle as tight  as you can without increasing your pain for __________ seconds. 4. Relax the muscles slowly and completely. Repeat __________ times. Complete this exercise __________ times a day. Exercise E: Straight leg raises ( quadriceps) 1. Lie on your back with your left / right leg extended and your other knee bent. 2. Tense the muscles in the front of your left / right thigh. 3. Keep these muscles tight as you raise your leg 4-6 inches (10-15 cm) off the floor. Do not let your knee bend. 4. Hold for __________ seconds. 5. Keep these muscles tense as you lower your leg. 6. Relax these muscles slowly and completely. Repeat __________ times. Complete this exercise __________ times a day. Exercise F: Straight leg raises ( hip abductors) 1. Lie on your side with your left / right leg in the top position. Lie so your head, shoulder, knee, and hip line up. You may bend your lower knee to help with your balance. 2. Lift your top leg 4-6 inches (10-15 cm), keeping your toes pointed straight ahead. 3. Hold this position for __________ seconds. 4. Slowly lower your leg to the starting position. 5. Allow your muscles to relax completely. Repeat __________ times. Complete this exercise __________ times a day. This information is not intended to replace advice given to you by your health care provider. Make sure you discuss any questions you have with your health care provider. Document Released: 04/22/2016 Document Revised: 04/24/2016 Elsevier Interactive Patient Education  2017 [employer].

## 2017-05-22 NOTE — Progress Notes (Signed)
Subjective:    Patient ID: Joshua Parrish, male    DOB: 1975/11/17, 41 y.o.   MRN: [id number]  Chief Complaint  Patient presents with  . Knee Pain    left knee pain x3 months, and bilateral shoulder pain     HPI:  Joshua Parrish is a 41 y.o. male who  has a past medical history of Chicken pox; Depression; Fatty liver; GERD (gastroesophageal reflux disease); Migraines; Osteoarthritis of shoulder; Rotator cuff impingement syndrome of left shoulder (06/19/2015); and Sinusitis. and presents today for an acute office visit.  1.) Left knee - This is a new problem. Associated symptom of pain located in his left knee has been going on for about 3 months. No trauma or injury. 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 Aggravated with going up stairs with a sharp pain and otherwise throbbing. Does feel like a lock sensation with a pop and can bend it better. Modifying include icy hot which has not helped. Has previous history of surgery in knee. There was an acute event about 4 years ago where his foot was planted on the ground and twisting which was concerning for ligament tear.   2.) Shoulder -  Associated symptom of pain located in his bilateral shoulders has been going on for about 1.5 weeks in the left and continues to go on the right which he received a cortisone injection which did help. Decreased range motion in both shoulder. Pains are described as sharp and dull. Severity is enough to effect his work. Previous rotator cuff repair in the left shoulder.   3.) Hypertension - Currently prescribed lisinopril-hydrochlorothiazide. Reports that he has been out of the medication for several weeks because he ran out of refills. Not currently checking his blood pressure at home. Denies worst headache of life.   BP Readings from Last 3 Encounters:  05/22/17 (!) 180/122  01/16/17 132/90  01/02/17 (!) 158/100    No Known Allergies    Outpatient Medications Prior to Visit    Medication Sig Dispense Refill  . gabapentin (NEURONTIN) 300 MG capsule Take 1 capsule (300 mg total) by mouth 3 (three) times daily.    . pantoprazole (PROTONIX) 40 MG tablet TAKE 1 TABLET BY MOUTH TWICE DAILY 180 tablet 0  . SUMAtriptan (IMITREX) 100 MG tablet Take 0.5-1 tablet by mouth daily as needed for headache. May repeat in 2 hours if headache persists or recurs. 10 tablet 0  . topiramate (TOPAMAX) 25 MG tablet Take 1 tablet (25 mg total) by mouth every evening. 30 tablet 0  . lisinopril-hydrochlorothiazide (PRINZIDE,ZESTORETIC) 20-12.5 MG tablet Take 1 tablet by mouth daily. 30 tablet 2  . meloxicam (MOBIC) 15 MG tablet Take 15 mg by mouth daily as needed for pain.  1  . ciprofloxacin (CIPRO) 500 MG tablet Take 1 tablet (500 mg total) by mouth 2 (two) times daily. 20 tablet 0   No facility-administered medications prior to visit.       Past Surgical History:  Procedure Laterality Date  . APPENDECTOMY    . CERVICAL LAMINECTOMY     has plates and fusion  . CHOLECYSTECTOMY N/A 12/24/2015   Procedure: LAPAROSCOPIC CHOLECYSTECTOMY ;  Surgeon: [name] Pickerel, MD;  Location: [facility];  Service: General;  Laterality: N/A;  . CLAVICLE EXCISION Left   . HYPOSPADIAS CORRECTION     as child  . KNEE SURGERY Bilateral    multiple  . SHOULDER ARTHROSCOPY WITH ROTATOR CUFF REPAIR  Left 06/26/2015   Procedure: SHOULDER ARTHROSCOPY WITH ROTATOR CUFF REPAIR;  Surgeon: Elsie Saas, MD;  Location: [facility];  Service: Orthopedics;  Laterality: Left;      Past Medical History:  Diagnosis Date  . Chicken pox   . Depression   . Fatty liver   . GERD (gastroesophageal reflux disease)   . Migraines   . Osteoarthritis of shoulder    left  . Rotator cuff impingement syndrome of left shoulder 06/19/2015  . Sinusitis       Review of Systems  Constitutional: Negative for chills, fatigue and fever.  Eyes:       Negative for changes in vision  Respiratory: Negative for cough,  chest tightness, shortness of breath and wheezing.   Cardiovascular: Negative for chest pain, palpitations and leg swelling.  Musculoskeletal:       Positive for bilateral shoulder and left knee pain.   Neurological: Positive for weakness. Negative for dizziness, light-headedness and numbness.      Objective:    BP (!) 180/122 (BP Location: Left Arm, Patient Position: Sitting, Cuff Size: Large)   Pulse (!) 103   Temp 99.4 F (37.4 C) (Oral)   Resp 16   Ht 5\' 9"  (1.753 m)   Wt 287 lb (130.2 kg)   SpO2 98%   BMI 42.38 kg/m  Nursing note and vital signs reviewed.  Physical Exam  Constitutional: He is oriented to person, place, and time. He appears well-developed and well-nourished. No distress.  Cardiovascular: Normal rate, regular rhythm, normal heart sounds and intact distal pulses.   Pulmonary/Chest: Effort normal and breath sounds normal.  Musculoskeletal:  Left knee - no obvious deformity or discoloration with mild edema. Tenderness without crepitus or deformity of the medial joint line. Range of motion limited in full extension and full flexion. Distal pulses and sensation are intact and appropriate. Ligamentous testing is negative. Positive McMurray's test.  Bilateral shoulders - no obvious deformity, discoloration, or edema. Palpable tenderness on the right side of the subacromial space and on the left side biceps tendon and acromioclavicular joint. Range of motion equal bilaterally with limits of approximately 120 of flexion and abduction. Strength is slightly decreased on the right compared to left. Distal pulses and sensation are intact and appropriate. Negative empty can; positive Michel Bickers and negative Neer's impingement.  Neurological: He is alert and oriented to person, place, and time.  Skin: Skin is warm and dry.  Psychiatric: He has a normal mood and affect. His behavior is normal. Judgment and thought content normal.       Assessment & Plan:   Problem List  Items Addressed This Visit      Cardiovascular and Mediastinum   Hypertension    Blood pressure poorly controlled secondary to running out and not currently taking medication. Refill lisinopril-hydrochlorothiazide. Encouraged to monitor blood pressure, follow low-sodium diet. Denies worse headache of life with no symptoms of end organ damage noted in physical exam. Follow-up for blood pressure check in 2 weeks.      Relevant Medications   lisinopril-hydrochlorothiazide (PRINZIDE,ZESTORETIC) 20-12.5 MG tablet     Other   Bilateral shoulder pain    Bilateral shoulder pain likely related to repetitive overhead motions at work in the setting of previous left shoulder rotator cuff repair. This will most likely progressively worsened given continued overhead motion at work. Concern for right partial rotator cuff tear previously responsive to cortisone injection. Left shoulder with concern for osteoarthritis or possible biceps tendinitis. Continue conservative  treatment with ice and meloxicam. Initiate home exercise therapy program. Refer to orthopedics for further assessment and treatment. Start prednisone taper. Follow-up if symptoms worsen or do not improve pending referral to orthopedics.      Left knee pain - Primary    Left knee in the setting of previous surgery an injury at work with concern for meniscal tear given locking and popping sensation. Obtain x-ray to rule out osteoarthritis. Treat conservatively with ice and home exercise therapy. Knee sleeve as needed. Refer to orthopedics for further assessment and treatment.      Relevant Orders   DG Knee Complete 4 Views Left       I have discontinued Mr. Deshazo's ciprofloxacin. I have also changed his meloxicam. Additionally, I am having him start on predniSONE. Lastly, I am having him maintain his pantoprazole, gabapentin, SUMAtriptan, topiramate, and lisinopril-hydrochlorothiazide.   Meds ordered this encounter  Medications  .  lisinopril-hydrochlorothiazide (PRINZIDE,ZESTORETIC) 20-12.5 MG tablet    Sig: Take 1 tablet by mouth daily.    Dispense:  90 tablet    Refill:  1  . meloxicam (MOBIC) 15 MG tablet    Sig: Take 1 tablet (15 mg total) by mouth daily as needed for pain.    Dispense:  90 tablet    Refill:  0    Order Specific Question:   Supervising Provider    Answer:   Pricilla Holm A [[id number]]  . predniSONE (STERAPRED UNI-PAK 21 TAB) 10 MG (21) TBPK tablet    Sig: Take 6 tablets x 1 day, 5 tablets x 1 day, 4 tablets x 1 day, 3 tablets x 1 day, 2 tablets x 1 day, 1 tablet x 1 day    Dispense:  21 tablet    Refill:  0    Order Specific Question:   Supervising Provider    Answer:   Pricilla Holm A [[id number]]     Follow-up: Return in about 2 weeks (around 06/05/2017).  Mauricio Po, FNP

## 2017-05-22 NOTE — Assessment & Plan Note (Signed)
Blood pressure poorly controlled secondary to running out and not currently taking medication. Refill lisinopril-hydrochlorothiazide. Encouraged to monitor blood pressure, follow low-sodium diet. Denies worse headache of life with no symptoms of end organ damage noted in physical exam. Follow-up for blood pressure check in 2 weeks.

## 2017-05-22 NOTE — Assessment & Plan Note (Signed)
Left knee in the setting of previous surgery an injury at work with concern for meniscal tear given locking and popping sensation. Obtain x-ray to rule out osteoarthritis. Treat conservatively with ice and home exercise therapy. Knee sleeve as needed. Refer to orthopedics for further assessment and treatment.

## 2017-05-22 NOTE — Assessment & Plan Note (Addendum)
Bilateral shoulder pain likely related to repetitive overhead motions at work in the setting of previous left shoulder rotator cuff repair. This will most likely progressively worsened given continued overhead motion at work. Concern for right partial rotator cuff tear previously responsive to cortisone injection. Left shoulder with concern for osteoarthritis or possible biceps tendinitis. Continue conservative treatment with ice and meloxicam. Initiate home exercise therapy program. Refer to orthopedics for further assessment and treatment. Start prednisone taper. Follow-up if symptoms worsen or do not improve pending referral to orthopedics.

## 2017-05-26 ENCOUNTER — Emergency Department (HOSPITAL_COMMUNITY)
Admission: EM | Admit: 2017-05-26 | Discharge: 2017-05-26 | Disposition: A | Discharge status: Home / Self Care | Payer: 59 | Attending: Emergency Medicine | Admitting: Emergency Medicine

## 2017-05-26 ENCOUNTER — Encounter (HOSPITAL_COMMUNITY): Payer: Self-pay | Admitting: Emergency Medicine

## 2017-05-26 ENCOUNTER — Telehealth: Payer: Self-pay

## 2017-05-26 DIAGNOSIS — Z5321 Procedure and treatment not carried out due to patient leaving prior to being seen by health care provider: Secondary | ICD-10-CM | POA: Insufficient documentation

## 2017-05-26 DIAGNOSIS — I10 Essential (primary) hypertension: Secondary | ICD-10-CM | POA: Insufficient documentation

## 2017-05-26 DIAGNOSIS — Z79899 Other long term (current) drug therapy: Secondary | ICD-10-CM | POA: Diagnosis not present

## 2017-05-26 DIAGNOSIS — N12 Tubulo-interstitial nephritis, not specified as acute or chronic: Secondary | ICD-10-CM | POA: Diagnosis not present

## 2017-05-26 DIAGNOSIS — F17220 Nicotine dependence, chewing tobacco, uncomplicated: Secondary | ICD-10-CM | POA: Insufficient documentation

## 2017-05-26 DIAGNOSIS — R350 Frequency of micturition: Secondary | ICD-10-CM | POA: Diagnosis present

## 2017-05-26 DIAGNOSIS — N418 Other inflammatory diseases of prostate: Secondary | ICD-10-CM

## 2017-05-26 LAB — COMPREHENSIVE METABOLIC PANEL
ALK PHOS: 58 U/L (ref 38–126)
ALT: 31 U/L (ref 17–63)
AST: 20 U/L (ref 15–41)
Albumin: 4 g/dL (ref 3.5–5.0)
Anion gap: 9 (ref 5–15)
BILIRUBIN TOTAL: 0.7 mg/dL (ref 0.3–1.2)
BUN: 18 mg/dL (ref 6–20)
CALCIUM: 9 mg/dL (ref 8.9–10.3)
CO2: 24 mmol/L (ref 22–32)
CREATININE: 0.94 mg/dL (ref 0.61–1.24)
Chloride: 106 mmol/L (ref 101–111)
GFR calc Af Amer: >60 mL/min (ref >60)
GLUCOSE: 85 mg/dL (ref 65–99)
Potassium: 4.1 mmol/L (ref 3.5–5.1)
Sodium: 139 mmol/L (ref 135–145)
TOTAL PROTEIN: 6.7 g/dL (ref 6.5–8.1)

## 2017-05-26 LAB — URINALYSIS, ROUTINE W REFLEX MICROSCOPIC
Bilirubin Urine: NEGATIVE
Glucose, UA: NEGATIVE mg/dL
KETONES UR: NEGATIVE mg/dL
Nitrite: NEGATIVE
PROTEIN: NEGATIVE mg/dL
SPECIFIC GRAVITY, URINE: 1.021 (ref 1.005–1.030)
pH: 5 (ref 5.0–8.0)

## 2017-05-26 LAB — CBC WITH DIFFERENTIAL/PLATELET
BASOS ABS: 0 10*3/uL (ref 0.0–0.1)
Basophils Relative: 0 %
EOS ABS: 0 10*3/uL (ref 0.0–0.7)
EOS PCT: 0 %
HCT: 40.4 % (ref 39.0–52.0)
Hemoglobin: 13.1 g/dL (ref 13.0–17.0)
Lymphocytes Relative: 19 %
Lymphs Abs: 2.8 10*3/uL (ref 0.7–4.0)
MCH: 29.4 pg (ref 26.0–34.0)
MCHC: 32.4 g/dL (ref 30.0–36.0)
MCV: 90.6 fL (ref 78.0–100.0)
MONO ABS: 1.2 10*3/uL — AB (ref 0.1–1.0)
Monocytes Relative: 8 %
Neutro Abs: 10.8 10*3/uL — ABNORMAL HIGH (ref 1.7–7.7)
Neutrophils Relative %: 73 %
PLATELETS: 275 10*3/uL (ref 150–400)
RBC: 4.46 MIL/uL (ref 4.22–5.81)
RDW: 13.1 % (ref 11.5–15.5)
WBC: 14.8 10*3/uL — AB (ref 4.0–10.5)

## 2017-05-26 LAB — I-STAT CG4 LACTIC ACID, ED: Lactic Acid, Venous: 1.6 mmol/L (ref 0.5–1.9)

## 2017-05-26 MED ORDER — PHENAZOPYRIDINE HCL 100 MG PO TABS: 100.0000 mg | ORAL_TABLET | Freq: Three times a day (TID) | ORAL | 0 refills | Status: DC | PRN

## 2017-05-26 MED ORDER — ONDANSETRON 4 MG PO TBDP: 4.0000 mg | ORAL_TABLET | Freq: Three times a day (TID) | ORAL | 0 refills | Status: AC | PRN

## 2017-05-26 MED ORDER — CIPROFLOXACIN IN D5W 400 MG/200ML IV SOLN
400.0000 mg | Freq: Once | INTRAVENOUS | Status: AC
  Administered 2017-05-26: 400 mg via INTRAVENOUS
  Filled 2017-05-26: qty 200

## 2017-05-26 MED ORDER — KETOROLAC TROMETHAMINE 15 MG/ML IJ SOLN
15.0000 mg | Freq: Once | INTRAMUSCULAR | Status: AC
  Administered 2017-05-26: 15 mg via INTRAVENOUS
  Filled 2017-05-26: qty 1

## 2017-05-26 MED ORDER — CIPROFLOXACIN HCL 500 MG PO TABS: 500.0000 mg | ORAL_TABLET | Freq: Two times a day (BID) | ORAL | 0 refills | Status: AC

## 2017-05-26 NOTE — ED Notes (Signed)
Pt ambulated from room to waiting area. Pt refused wheelchair and had no complaints while ambulating. Assisted into gown and placed on bp and o2 monitor.

## 2017-05-26 NOTE — Telephone Encounter (Signed)
Pyridium sent to pharmacy

## 2017-05-26 NOTE — ED Provider Notes (Signed)
[facility] DEPT Provider Note   CSN: [id number] Arrival date & time: 05/26/17  0620     History   Chief Complaint Chief Complaint  Patient presents with  . Back Pain  . Urinary Frequency    HPI Joshua Parrish is a 41 y.o. male.  The history is provided by the patient.  Back Pain   This is a recurrent problem. Episode onset: 1 month. The problem occurs constantly. The problem has been gradually worsening. Associated with: h/o prostatitis. Pain location: right flank. Associated symptoms include dysuria. Pertinent negatives include no fever, no bowel incontinence, no perianal numbness and no bladder incontinence.   Patient reports symptoms are similar to her recent presentation for prostatitis in April of this year.  No h/o STI. Married but not sexually active for 4 months. No anal intercourse.   Seen by PCP 5 days ago. Scheduled for Urology on 06/16/17.   H/o hypospadia as a child s/p repair at age 56 - 54 yrs of age.   Past Medical History:  Diagnosis Date  . Chicken pox   . Depression   . Fatty liver   . GERD (gastroesophageal reflux disease)   . Migraines   . Osteoarthritis of shoulder    left  . Rotator cuff impingement syndrome of left shoulder 06/19/2015  . Sinusitis     Patient Active Problem List   Diagnosis Date Noted  . Bilateral shoulder pain 05/22/2017  . Left knee pain 05/22/2017  . Urinary frequency 02/09/2017  . Hypertension 01/16/2017  . Recent skin changes 01/16/2017  . Migraine with aura and without status migrainosus, not intractable 01/02/2017  . Prostatitis 12/25/2016  . Sepsis ([place]) 12/25/2016  . GERD (gastroesophageal reflux disease) 12/25/2016  . Sinusitis 12/09/2016  . Tendinitis of right rotator cuff 12/09/2016  . Laryngopharyngeal reflux (LPR) 02/04/2016  . Coughing 01/25/2016  . AP (abdominal pain) 11/29/2015  . Nausea without vomiting 11/29/2015  . Diarrhea 11/29/2015  . Anal or rectal pain 11/29/2015  . Mass in neck 11/27/2015   . RUQ abdominal pain 11/27/2015  . Rotator cuff impingement syndrome of left shoulder 06/19/2015  . Acromioclavicular joint arthritis 06/19/2015    Past Surgical History:  Procedure Laterality Date  . APPENDECTOMY    . CERVICAL LAMINECTOMY     has plates and fusion  . CHOLECYSTECTOMY N/A 12/24/2015   Procedure: LAPAROSCOPIC CHOLECYSTECTOMY ;  Surgeon: [name] Pickerel, MD;  Location: [facility];  Service: General;  Laterality: N/A;  . CLAVICLE EXCISION Left   . HYPOSPADIAS CORRECTION     as child  . KNEE SURGERY Bilateral    multiple  . SHOULDER ARTHROSCOPY WITH ROTATOR CUFF REPAIR Left 06/26/2015   Procedure: SHOULDER ARTHROSCOPY WITH ROTATOR CUFF REPAIR;  Surgeon: Elsie Saas, MD;  Location: [facility];  Service: Orthopedics;  Laterality: Left;       Home Medications    Prior to Admission medications   Medication Sig Start Date End Date Taking? Authorizing Provider  gabapentin (NEURONTIN) 300 MG capsule Take 1 capsule (300 mg total) by mouth 3 (three) times daily. 12/29/16  Yes Velvet Bathe, MD  lisinopril-hydrochlorothiazide (PRINZIDE,ZESTORETIC) 20-12.5 MG tablet Take 1 tablet by mouth daily. 05/22/17  Yes Golden Circle, FNP  meloxicam (MOBIC) 15 MG tablet Take 1 tablet (15 mg total) by mouth daily as needed for pain. 05/22/17  Yes Golden Circle, FNP  OVER THE COUNTER MEDICATION Take 1 capsule by mouth 2 (two) times daily. CELLUCOR HD FATBURNER   Yes [provider]  OVER THE COUNTER MEDICATION Take 1 capsule by mouth 2 (two) times daily. CELLUCOR C4   Yes [provider]  pantoprazole (PROTONIX) 40 MG tablet TAKE 1 TABLET BY MOUTH TWICE DAILY 09/29/16  Yes Golden Circle, FNP  predniSONE (STERAPRED UNI-PAK 21 TAB) 10 MG (21) TBPK tablet Take 6 tablets x 1 day, 5 tablets x 1 day, 4 tablets x 1 day, 3 tablets x 1 day, 2 tablets x 1 day, 1 tablet x 1 day 05/22/17  Yes Golden Circle, FNP  SUMAtriptan (IMITREX) 100 MG tablet Take 0.5-1 tablet  by mouth daily as needed for headache. May repeat in 2 hours if headache persists or recurs. 01/02/17  Yes Golden Circle, FNP  ciprofloxacin (CIPRO) 500 MG tablet Take 1 tablet (500 mg total) by mouth 2 (two) times daily. 05/26/17 06/23/17  Fatima Blank, MD  ondansetron (ZOFRAN ODT) 4 MG disintegrating tablet Take 1 tablet (4 mg total) by mouth every 8 (eight) hours as needed for nausea or vomiting. 05/26/17 05/29/17  Fatima Blank, MD  topiramate (TOPAMAX) 25 MG tablet Take 1 tablet (25 mg total) by mouth every evening. 01/16/17   Golden Circle, FNP    Family History Family History  Problem Relation Age of Onset  . Arthritis Mother   . Ovarian cancer Mother   . Alcohol abuse Father   . Breast cancer Maternal Grandmother   . Cancer Maternal Grandmother        spinal surgery  . Breast cancer Maternal Grandfather   . Cancer Other   . Hypertension Other   . Diabetes Other   . Colon cancer Neg Hx     Social History Social History  Substance Use Topics  . Smoking status: Never Smoker  . Smokeless tobacco: Current User    Types: Chew     Comment: form given 11/29/15  . Alcohol use 0.0 oz/week     Comment: occasional     Allergies   Food   Review of Systems Review of Systems  Constitutional: Negative for fever.  Gastrointestinal: Negative for bowel incontinence.  Genitourinary: Positive for decreased urine volume, dysuria and urgency. Negative for bladder incontinence.  Musculoskeletal: Positive for back pain.   All other systems are reviewed and are negative for acute change except as noted in the HPI   Physical Exam Updated Vital Signs BP 135/88   Pulse 78   Temp 98.1 F (36.7 C) (Oral)   Resp 19   Ht 5\' 8"  (1.727 m)   Wt 127 kg (280 lb)   SpO2 100%   BMI 42.57 kg/m   Physical Exam  Constitutional: He is oriented to person, place, and time. He appears well-developed and well-nourished. No distress.  HENT:  Head: Normocephalic and  atraumatic.  Nose: Nose normal.  Eyes: Pupils are equal, round, and reactive to light. Conjunctivae and EOM are normal. Right eye exhibits no discharge. Left eye exhibits no discharge. No scleral icterus.  Neck: Normal range of motion. Neck supple.  Cardiovascular: Normal rate and regular rhythm.  Exam reveals no gallop and no friction rub.   No murmur heard. Pulmonary/Chest: Effort normal and breath sounds normal. No stridor. No respiratory distress. He has no rales.  Abdominal: Soft. He exhibits no distension. There is tenderness in the suprapubic area. There is CVA tenderness (right). Hernia confirmed negative in the right inguinal area and confirmed negative in the left inguinal area.  Genitourinary: Right testis shows no swelling and no tenderness.  Left testis shows no swelling and no tenderness. Circumcised. No penile erythema or penile tenderness. No discharge found.  Genitourinary Comments: Pain with palpation of the perineum.  Musculoskeletal: He exhibits no edema or tenderness.  Neurological: He is alert and oriented to person, place, and time.  Skin: Skin is warm and dry. No rash noted. He is not diaphoretic. No erythema.  Psychiatric: He has a normal mood and affect.  Vitals reviewed.    ED Treatments / Results  Labs (all labs ordered are listed, but only abnormal results are displayed) Labs Reviewed  URINALYSIS, ROUTINE W REFLEX MICROSCOPIC - Abnormal; Notable for the following:       Result Value   APPearance HAZY (*)    Hgb urine dipstick SMALL (*)    Leukocytes, UA LARGE (*)    Bacteria, UA RARE (*)    Squamous Epithelial / LPF 0-5 (*)    All other components within normal limits  CBC WITH DIFFERENTIAL/PLATELET - Abnormal; Notable for the following:    WBC 14.8 (*)    Neutro Abs 10.8 (*)    Monocytes Absolute 1.2 (*)    All other components within normal limits  CULTURE, BLOOD (ROUTINE X 2)  CULTURE, BLOOD (ROUTINE X 2)  URINE CULTURE  COMPREHENSIVE METABOLIC  PANEL  I-STAT CG4 LACTIC ACID, ED    EKG  EKG Interpretation None       Radiology No results found.  Procedures Procedures (including critical care time)  Medications Ordered in ED Medications  ketorolac (TORADOL) 15 MG/ML injection 15 mg (15 mg Intravenous Given 05/26/17 0937)  ciprofloxacin (CIPRO) IVPB 400 mg (400 mg Intravenous New Bag/Given 05/26/17 1027)     Initial Impression / Assessment and Plan / ED Course  I have reviewed the triage vital signs and the nursing notes.  Pertinent labs & imaging results that were available during my care of the patient were reviewed by me and considered in my medical decision making (see chart for details).     Presentation consistent with prostatitis/pyelonephritis. UA with positive leuk trase. On prior CT scans, no stones identified. Labs with leukocytosis, otherwise grossly reassuring without evidence of severe sepsis.   On review of records, last urinary culture grew out Escherichia coli that was pan sensitive.   Patient able to tolerate by mouth intake.  Given empiric IV Cipro in the emergency department. We'll continue outpatient management. Patient only has follow-up with urology in 2 weeks.  The patient is safe for discharge with strict return precautions.   Final Clinical Impressions(s) / ED Diagnoses   Final diagnoses:  Other prostatic inflammatory diseases  Pyelonephritis   Disposition: Discharge  Condition: Good  I have discussed the results, Dx and Tx plan with the patient who expressed understanding and agree(s) with the plan. Discharge instructions discussed at great length. The patient was given strict return precautions who verbalized understanding of the instructions. No further questions at time of discharge.    New Prescriptions   CIPROFLOXACIN (CIPRO) 500 MG TABLET    Take 1 tablet (500 mg total) by mouth 2 (two) times daily.   ONDANSETRON (ZOFRAN ODT) 4 MG DISINTEGRATING TABLET    Take 1 tablet (4  mg total) by mouth every 8 (eight) hours as needed for nausea or vomiting.    Follow Up: Urology   As scheduled      Fatima Blank, MD 05/26/17 1239

## 2017-05-26 NOTE — ED Triage Notes (Signed)
Patient reports has prostatitis and been having having dysuria for about month.  Patient was seen at [facility] ED this am and was told urine has infection but everything else was good and started on antibiotics.  Patient reports that still having same symptoms and not feeling any better so he called his doctor who told him to go back to ED due to possible sepsis.  Patient not running fever.

## 2017-05-26 NOTE — ED Notes (Signed)
Gave pt water.  Pt consuming water at this time without complication.

## 2017-05-26 NOTE — ED Triage Notes (Signed)
Patient here with urinary frequency, dysuria and headache.  He states that this started about a month ago, has gotten worse.  He called his urologist that stated that try to hold out until your appt in Oct but pain increased and got to ED.

## 2017-05-26 NOTE — Telephone Encounter (Signed)
Pt was just seen in the ER for prostatitis. He has an appointment with urology on 06/16/17. Was advised by the hospital to call PCP to see if you can prescribe something to help with the burning with urination. Please advise.

## 2017-05-26 NOTE — ED Notes (Signed)
Pt stable, ambulatory, and verbalizes understanding of discharge paperwork.

## 2017-05-26 NOTE — Telephone Encounter (Addendum)
Called pt to let him know the rx was sent in. While on the phone with pt he stated that he has a 101 fever, felt like he was about to pass out, severe lower abdominal pain, and problems urinating. Advised per Dr. Quay Burow and Marya Amsler that pt go back to the hospital ASAP. Pt understood and stated that he was going to call his mom to take him who only lives 5 mins away from pt.

## 2017-05-27 ENCOUNTER — Emergency Department (HOSPITAL_COMMUNITY)
Admission: EM | Admit: 2017-05-27 | Discharge: 2017-05-27 | Discharge status: Against Medical Advice | Payer: 59 | Source: Home / Self Care

## 2017-05-27 LAB — URINE CULTURE

## 2017-05-27 NOTE — ED Notes (Signed)
Joshua Parrish, NT called patient for assigned triage room with no answer.

## 2017-05-27 NOTE — ED Notes (Signed)
No responce

## 2017-05-31 LAB — CULTURE, BLOOD (ROUTINE X 2)
CULTURE: NO GROWTH
CULTURE: NO GROWTH
SPECIAL REQUESTS: ADEQUATE
Special Requests: ADEQUATE

## 2017-06-16 DIAGNOSIS — R3912 Poor urinary stream: Secondary | ICD-10-CM | POA: Diagnosis not present

## 2017-06-16 DIAGNOSIS — R3913 Splitting of urinary stream: Secondary | ICD-10-CM | POA: Diagnosis not present

## 2017-06-16 DIAGNOSIS — R350 Frequency of micturition: Secondary | ICD-10-CM | POA: Diagnosis not present

## 2017-07-06 ENCOUNTER — Encounter: Payer: Self-pay | Admitting: Nurse Practitioner

## 2017-07-06 ENCOUNTER — Ambulatory Visit: Payer: 59 | Admitting: Nurse Practitioner

## 2017-07-06 VITALS — BP 148/98 | HR 108 | Temp 98.6°F | Resp 16 | Ht 68.0 in | Wt 289.0 lb

## 2017-07-06 DIAGNOSIS — Z23 Encounter for immunization: Secondary | ICD-10-CM

## 2017-07-06 DIAGNOSIS — M7542 Impingement syndrome of left shoulder: Secondary | ICD-10-CM | POA: Diagnosis not present

## 2017-07-06 DIAGNOSIS — I10 Essential (primary) hypertension: Secondary | ICD-10-CM | POA: Diagnosis not present

## 2017-07-06 DIAGNOSIS — K2100 Gastro-esophageal reflux disease with esophagitis, without bleeding: Secondary | ICD-10-CM

## 2017-07-06 MED ORDER — PANTOPRAZOLE SODIUM 40 MG PO TBEC: 40.0000 mg | DELAYED_RELEASE_TABLET | Freq: Two times a day (BID) | ORAL | 0 refills | Status: DC

## 2017-07-06 MED ORDER — GABAPENTIN 300 MG PO CAPS: 300.0000 mg | ORAL_CAPSULE | Freq: Three times a day (TID) | ORAL | Status: DC

## 2017-07-06 MED ORDER — AMLODIPINE BESYLATE 5 MG PO TABS: 5.0000 mg | ORAL_TABLET | Freq: Every day | ORAL | 3 refills | Status: DC

## 2017-07-06 NOTE — Assessment & Plan Note (Signed)
BP above goal 140/90. Maintained on lisinopril-hctz. Denies adverse medication effects. Denies headaches, vision changes, cp, sob. Will add amlodipine 5mg  daily-side effects including edema discussed. Encouraged to log blood pressure at home. Hell return in 1 month for follow up

## 2017-07-06 NOTE — Assessment & Plan Note (Signed)
Rotator cuff impingement syndrome of left shoulder- chronic, maintained on gabapentin prn. He denies any weakness, decreased grips, dizziness or drowsiness. He reports mild LE edema. Continue gabapentin at current dosage

## 2017-07-06 NOTE — Patient Instructions (Addendum)
I have sent refills of your gabapentin and protonix.  I have sent a prescription for a new medication for your blood pressure, amlodipine 5mg  once daily.  Id like to see you back in 1 month for a recheck of your blood pressure. Please try to check your blood pressure once daily or at least a few times a week, at the same time each day, and keep a log to bring when your come back.  It was nice to meet you. Thanks for letting me take care of you today :)

## 2017-07-06 NOTE — Progress Notes (Signed)
Subjective:    Patient ID: Joshua Parrish, male    DOB: 1976-05-20, 41 y.o.   MRN: [id number]  HPI [name] is a 41 yo male who presents today to establish care. She s transferring to me from another provider in the same clinic.  GERD- Maintained on protonix 40mg  BID. He ran out of the protonix about 2 weeks ago due to no refill. hes been experiencing GERD symptoms while off the protonix- coughing, heartbrun, regurgitation, nausea. hes been taking Tums daily without any relief.  Shoulder pain- Chronic since a neck surgery with nerve impingement in 2015. he's maintained on gabapentin TID prn for this. He occasionally experiences a tingling pain in his left arm and will take a one week course of gabapentin with improvement. He does experience difficulty fully extending his arm. He denies any weakness, decreased grips, adverse medication effects.   Hypertension- maintained on lisinopril-hctz. Reports daily compliance. He denies headaches, vision changes, chest pain, shortness of breath. He does not check routinely his blood pressure but occasionally checks and reports readings 150's over 100's.  BP Readings from Last 3 Encounters:  07/06/17 (!) 148/98  05/26/17 (!) 164/117  05/26/17 136/81    Review of Systems  See HPI  Past Medical History:  Diagnosis Date  . Chicken pox   . Depression   . Fatty liver   . GERD (gastroesophageal reflux disease)   . Migraines   . Osteoarthritis of shoulder    left  . Rotator cuff impingement syndrome of left shoulder 06/19/2015  . Sinusitis      Social History   Socioeconomic History  . Marital status: Married    Spouse name: Not on file  . Number of children: 1  . Years of education: 25  . Highest education level: Not on file  Social Needs  . Financial resource strain: Not on file  . Food insecurity - worry: Not on file  . Food insecurity - inability: Not on file  . Transportation needs - medical: Not on file  . Transportation needs -  non-medical: Not on file  Occupational History  . Occupation: Paramedic   Tobacco Use  . Smoking status: Never Smoker  . Smokeless tobacco: Current User    Types: Chew  . Tobacco comment: form given 11/29/15  Substance and Sexual Activity  . Alcohol use: Yes    Alcohol/week: 0.0 oz    Comment: occasional  . Drug use: No  . Sexual activity: Yes  Other Topics Concern  . Not on file  Social History Narrative   Fun: Lift weights, hike    Past Surgical History:  Procedure Laterality Date  . APPENDECTOMY    . CERVICAL LAMINECTOMY     has plates and fusion  . CLAVICLE EXCISION Left   . HYPOSPADIAS CORRECTION     as child  . KNEE SURGERY Bilateral    multiple    Family History  Problem Relation Age of Onset  . Arthritis Mother   . Ovarian cancer Mother   . Alcohol abuse Father   . Breast cancer Maternal Grandmother   . Cancer Maternal Grandmother        spinal surgery  . Breast cancer Maternal Grandfather   . Cancer Other   . Hypertension Other   . Diabetes Other   . Colon cancer Neg Hx     Allergies  Allergen Reactions  . Food Diarrhea and Nausea And Vomiting    COCONUT    Current Outpatient  Medications on File Prior to Visit  Medication Sig Dispense Refill  . lisinopril-hydrochlorothiazide (PRINZIDE,ZESTORETIC) 20-12.5 MG tablet Take 1 tablet by mouth daily. 90 tablet 1  . meloxicam (MOBIC) 15 MG tablet Take 1 tablet (15 mg total) by mouth daily as needed for pain. 90 tablet 0  . OVER THE COUNTER MEDICATION Take 1 capsule by mouth 2 (two) times daily. [place]    . OVER THE COUNTER MEDICATION Take 1 capsule by mouth 2 (two) times daily. CELLUCOR C4    . phenazopyridine (PYRIDIUM) 100 MG tablet Take 1-2 tablets (100-200 mg total) by mouth 3 (three) times daily as needed for pain. 20 tablet 0  . SUMAtriptan (IMITREX) 100 MG tablet Take 0.5-1 tablet by mouth daily as needed for headache. May repeat in 2 hours if headache persists or recurs.  10 tablet 0  . topiramate (TOPAMAX) 25 MG tablet Take 1 tablet (25 mg total) by mouth every evening. 30 tablet 0   No current facility-administered medications on file prior to visit.     BP (!) 148/98 (BP Location: Left Arm, Patient Position: Sitting, Cuff Size: Large)   Pulse (!) 108   Temp 98.6 F (37 C) (Oral)   Resp 16   Ht 5\' 8"  (1.727 m)   Wt 289 lb (131.1 kg)   SpO2 98%   BMI 43.94 kg/m       Objective:   Physical Exam  Constitutional: He is oriented to person, place, and time. He appears well-developed and well-nourished. No distress.  obese  Cardiovascular: Normal rate, regular rhythm, normal heart sounds and intact distal pulses.  Pulmonary/Chest: Effort normal and breath sounds normal.  Musculoskeletal: He exhibits no edema, tenderness or deformity.  Decreased extension of left arm  Neurological: He is alert and oriented to person, place, and time. Coordination normal.  Skin: Skin is warm and dry.  Psychiatric: He has a normal mood and affect. Judgment and thought content normal.      Assessment & Plan:  Flu shot given today

## 2017-07-06 NOTE — Assessment & Plan Note (Signed)
Gastroesophageal reflux disease with esophagitis- maintained on protonix. He experiences breakthrough GERD symptoms when he does not take protonix daily. Continue protonix at current dosage.

## 2017-08-07 ENCOUNTER — Ambulatory Visit: Payer: Self-pay | Admitting: Nurse Practitioner

## 2017-10-06 ENCOUNTER — Telehealth: Payer: Self-pay

## 2017-10-06 MED ORDER — GABAPENTIN 300 MG PO CAPS: 300.0000 mg | ORAL_CAPSULE | Freq: Three times a day (TID) | ORAL | 1 refills | Status: DC

## 2017-10-06 MED ORDER — GABAPENTIN 300 MG PO CAPS: 300.0000 mg | ORAL_CAPSULE | Freq: Three times a day (TID) | ORAL | Status: DC

## 2017-10-06 NOTE — Telephone Encounter (Signed)
Refill sent.

## 2017-10-06 NOTE — Telephone Encounter (Signed)
Received refill request from [facility] on [address] for gabapentin. Please advise on refill.

## 2017-10-14 ENCOUNTER — Other Ambulatory Visit: Payer: Self-pay

## 2017-10-27 ENCOUNTER — Encounter (HOSPITAL_COMMUNITY): Payer: Self-pay | Admitting: Emergency Medicine

## 2017-10-27 ENCOUNTER — Other Ambulatory Visit: Payer: Self-pay

## 2017-10-27 ENCOUNTER — Emergency Department (HOSPITAL_COMMUNITY)
Admission: EM | Admit: 2017-10-27 | Discharge: 2017-10-27 | Disposition: A | Discharge status: Home / Self Care | Payer: 59 | Attending: Emergency Medicine | Admitting: Emergency Medicine

## 2017-10-27 ENCOUNTER — Emergency Department (HOSPITAL_COMMUNITY): Payer: 59

## 2017-10-27 DIAGNOSIS — Z79899 Other long term (current) drug therapy: Secondary | ICD-10-CM | POA: Diagnosis not present

## 2017-10-27 DIAGNOSIS — I10 Essential (primary) hypertension: Secondary | ICD-10-CM | POA: Insufficient documentation

## 2017-10-27 DIAGNOSIS — R079 Chest pain, unspecified: Secondary | ICD-10-CM | POA: Diagnosis not present

## 2017-10-27 DIAGNOSIS — R0789 Other chest pain: Secondary | ICD-10-CM | POA: Insufficient documentation

## 2017-10-27 LAB — TROPONIN I: Troponin I: <0.03 ng/mL (ref <0.03)

## 2017-10-27 LAB — CBC
HEMATOCRIT: 41.1 % (ref 39.0–52.0)
Hemoglobin: 13.7 g/dL (ref 13.0–17.0)
MCH: 29.3 pg (ref 26.0–34.0)
MCHC: 33.3 g/dL (ref 30.0–36.0)
MCV: 88 fL (ref 78.0–100.0)
Platelets: 280 10*3/uL (ref 150–400)
RBC: 4.67 MIL/uL (ref 4.22–5.81)
RDW: 12.7 % (ref 11.5–15.5)
WBC: 8.1 10*3/uL (ref 4.0–10.5)

## 2017-10-27 LAB — BASIC METABOLIC PANEL
ANION GAP: 11 (ref 5–15)
BUN: 10 mg/dL (ref 6–20)
CHLORIDE: 103 mmol/L (ref 101–111)
CO2: 22 mmol/L (ref 22–32)
Calcium: 9.1 mg/dL (ref 8.9–10.3)
Creatinine, Ser: 0.82 mg/dL (ref 0.61–1.24)
GFR calc Af Amer: >60 mL/min (ref >60)
GLUCOSE: 90 mg/dL (ref 65–99)
POTASSIUM: 3.9 mmol/L (ref 3.5–5.1)
Sodium: 136 mmol/L (ref 135–145)

## 2017-10-27 LAB — I-STAT TROPONIN, ED: Troponin i, poc: 0 ng/mL (ref 0.00–0.08)

## 2017-10-27 MED ORDER — ASPIRIN 81 MG PO CHEW
324.0000 mg | CHEWABLE_TABLET | Freq: Once | ORAL | Status: AC
  Administered 2017-10-27: 324 mg via ORAL
  Filled 2017-10-27: qty 4

## 2017-10-27 NOTE — ED Notes (Addendum)
Mini lab misplaced the Pt's dark green tube. I-stat was not able to be run because it is outside window.  Trop added on to labs sent down to main lab. Main lab called and asked to run stat.

## 2017-10-27 NOTE — ED Notes (Signed)
No e-sign pad available; patient verbalized understanding.

## 2017-10-27 NOTE — ED Triage Notes (Signed)
Per PT: C/O of CP since Sunday. Pt states it started as a squeezing felling on his L pectoral. Pt states the pain traveled down his L arm and jaw. Pt has no cardiac Hx.  VSS A&Ox4. SOB with dizziness.

## 2017-10-27 NOTE — Discharge Instructions (Signed)
Your lab work and EKG and chest x-ray were reassuring.  Please schedule an appointment with your regular doctor for follow-up of your ER visit today.  You may need stress testing.  Return to the emergency department if your chest pain becomes worse with exertion, you have chest pain with sweating, chest pain with nausea/vomiting or your chest pain worsens or becomes concerning in any way.

## 2017-10-27 NOTE — ED Provider Notes (Signed)
[facility] Provider Note   CSN: [id number] Arrival date & time: 10/27/17  1244     History   Chief Complaint Chief Complaint  Patient presents with  . Chest Pain    HPI Joshua Parrish is a 42 y.o. male.  HPI   Joshua Parrish is a 42 year old male with a history of hypertension, GERD, cervical laminectomy with chronic numbness in let index finger and migraine headaches who presents to the emergency department for evaluation of left-sided chest pain.  Patient states that he has had episodes of left-sided chest pain for the past year and a half now which occur about twice a month.  States that he has never been evaluated by a cardiologist for his symptoms.  Reports that his current episode of chest pain is similar to previous in which he feels a tightness and squeezing sensation in his left pack.  The pain comes and goes without any known trigger.  States the pain is 10/10 in severity at its worst.  Pain occasionally radiates to the left arm which has a tingling sensation. Also reports shortness of breath and feeling light headed with episodes of pain.  Pain is worsened somewhat with movement of the arms.  States that his current symptoms feel like his chest pain which she has experienced in the past, states that he came here today because he has never been evaluated and wanted to be worked up.  Denies history of exertional chest pain.  States that his grandfather passed away from what he thinks was a heart attack at age 76.  Denies smoking history.  Denies fevers, chills, cough, abdominal pain, nausea/vomiting, dysuria. He denies history of DVT/PE, leg swelling or calf tenderness, pleuritic chest pain, recent immobilization or surgery, exogenous testosterone.  Past Medical History:  Diagnosis Date  . Chicken pox   . Depression   . Fatty liver   . GERD (gastroesophageal reflux disease)   . Migraines   . Osteoarthritis of shoulder    left  . Rotator cuff  impingement syndrome of left shoulder 06/19/2015  . Sinusitis     Patient Active Problem List   Diagnosis Date Noted  . Bilateral shoulder pain 05/22/2017  . Left knee pain 05/22/2017  . Urinary frequency 02/09/2017  . Hypertension 01/16/2017  . Migraine with aura and without status migrainosus, not intractable 01/02/2017  . Prostatitis 12/25/2016  . Sepsis ([place]) 12/25/2016  . GERD (gastroesophageal reflux disease) 12/25/2016  . Sinusitis 12/09/2016  . Tendinitis of right rotator cuff 12/09/2016  . Laryngopharyngeal reflux (LPR) 02/04/2016  . Mass in neck 11/27/2015  . Rotator cuff impingement syndrome of left shoulder 06/19/2015  . Acromioclavicular joint arthritis 06/19/2015    Past Surgical History:  Procedure Laterality Date  . APPENDECTOMY    . CERVICAL LAMINECTOMY     has plates and fusion  . CHOLECYSTECTOMY N/A 12/24/2015   Procedure: LAPAROSCOPIC CHOLECYSTECTOMY ;  Surgeon: [name] Pickerel, MD;  Location: [facility];  Service: General;  Laterality: N/A;  . CLAVICLE EXCISION Left   . HYPOSPADIAS CORRECTION     as child  . KNEE SURGERY Bilateral    multiple  . SHOULDER ARTHROSCOPY WITH ROTATOR CUFF REPAIR Left 06/26/2015   Procedure: SHOULDER ARTHROSCOPY WITH ROTATOR CUFF REPAIR;  Surgeon: Elsie Saas, MD;  Location: [facility];  Service: Orthopedics;  Laterality: Left;       Home Medications    Prior to Admission medications   Medication Sig Start Date End Date  Taking? Authorizing Provider  amLODipine (NORVASC) 5 MG tablet Take 1 tablet (5 mg total) daily by mouth. 07/06/17   Lance Sell, NP  gabapentin (NEURONTIN) 300 MG capsule Take 1 capsule (300 mg total) by mouth 3 (three) times daily. 10/06/17   Lance Sell, NP  lisinopril-hydrochlorothiazide (PRINZIDE,ZESTORETIC) 20-12.5 MG tablet Take 1 tablet by mouth daily. 05/22/17   Golden Circle, FNP  meloxicam (MOBIC) 15 MG tablet Take 1 tablet (15 mg total) by mouth daily as needed for  pain. 05/22/17   Golden Circle, FNP  OVER THE COUNTER MEDICATION Take 1 capsule by mouth 2 (two) times daily. CELLUCOR HD [place]    [provider]  OVER THE COUNTER MEDICATION Take 1 capsule by mouth 2 (two) times daily. [place] C4    [provider]  pantoprazole (PROTONIX) 40 MG tablet Take 1 tablet (40 mg total) 2 (two) times daily by mouth. 07/06/17   Lance Sell, NP  phenazopyridine (PYRIDIUM) 100 MG tablet Take 1-2 tablets (100-200 mg total) by mouth 3 (three) times daily as needed for pain. 05/26/17   Golden Circle, FNP  SUMAtriptan (IMITREX) 100 MG tablet Take 0.5-1 tablet by mouth daily as needed for headache. May repeat in 2 hours if headache persists or recurs. 01/02/17   Golden Circle, FNP  topiramate (TOPAMAX) 25 MG tablet Take 1 tablet (25 mg total) by mouth every evening. 01/16/17   Golden Circle, FNP    Family History Family History  Problem Relation Age of Onset  . Arthritis Mother   . Ovarian cancer Mother   . Alcohol abuse Father   . Breast cancer Maternal Grandmother   . Cancer Maternal Grandmother        spinal surgery  . Breast cancer Maternal Grandfather   . Cancer Other   . Hypertension Other   . Diabetes Other   . Colon cancer Neg Hx     Social History Social History   Tobacco Use  . Smoking status: Never Smoker  . Smokeless tobacco: Current User    Types: Chew  . Tobacco comment: form given 11/29/15  Substance Use Topics  . Alcohol use: Yes    Alcohol/week: 0.0 oz    Comment: occasional  . Drug use: No     Allergies   Food   Review of Systems Review of Systems  Constitutional: Negative for chills, diaphoresis and fever.  HENT: Negative for congestion and sore throat.   Eyes: Negative for visual disturbance.  Respiratory: Positive for shortness of breath. Negative for cough.   Cardiovascular: Positive for chest pain.  Gastrointestinal: Negative for abdominal pain, nausea and vomiting.    Genitourinary: Negative for difficulty urinating, dysuria and frequency.  Musculoskeletal: Negative for back pain, gait problem and joint swelling.  Skin: Negative for rash.  Neurological: Positive for light-headedness and numbness (chronically in left index finger). Negative for dizziness and headaches.  Psychiatric/Behavioral: Negative for agitation.     Physical Exam Updated Vital Signs BP (!) 156/111 (BP Location: Left Arm)   Pulse 74   Temp 98.1 F (36.7 C) (Oral)   Resp 16   Ht 5\' 9"  (1.753 m)   Wt 131.1 kg (289 lb)   SpO2 100%   BMI 42.68 kg/m   Physical Exam  Constitutional: He is oriented to person, place, and time. He appears well-developed and well-nourished. No distress.  Laying at bedside in no apparent distress.   HENT:  Head: Normocephalic and atraumatic.  Mouth/Throat: Oropharynx  is clear and moist. No oropharyngeal exudate.  Eyes: Conjunctivae are normal. Pupils are equal, round, and reactive to light. Right eye exhibits no discharge. Left eye exhibits no discharge.  Neck: Normal range of motion. Neck supple. No JVD present. No tracheal deviation present.  Cardiovascular: Normal rate, regular rhythm and intact distal pulses. Exam reveals no friction rub.  No murmur heard. Pulmonary/Chest: Effort normal and breath sounds normal. No stridor. No respiratory distress. He has no wheezes. He has no rales.  Chest wall nontender to palpation.  Abdominal: Soft. Bowel sounds are normal. There is no tenderness. There is no guarding.  Musculoskeletal: Normal range of motion.  No leg swelling or calf tenderness.  Neurological: He is alert and oriented to person, place, and time. Coordination normal.  Skin: Skin is warm and dry. Capillary refill takes less than 2 seconds. He is not diaphoretic.  Psychiatric: He has a normal mood and affect. His behavior is normal.  Nursing note and vitals reviewed.    ED Treatments / Results  Labs (all labs ordered are listed, but  only abnormal results are displayed) Labs Reviewed  BASIC METABOLIC PANEL  CBC  TROPONIN I  I-STAT TROPONIN, ED  I-STAT TROPONIN, ED    EKG  EKG Interpretation None       Radiology Dg Chest 2 View  Result Date: 10/27/2017 CLINICAL DATA:  Chest pain. EXAM: CHEST  2 VIEW COMPARISON:  Chest x-ray dated December 25, 2016. FINDINGS: The heart size and mediastinal contours are within normal limits. Both lungs are clear. The visualized skeletal structures are unremarkable. IMPRESSION: No active cardiopulmonary disease. Electronically Signed   By: Titus Dubin M.D.   On: 10/27/2017 13:23    Procedures Procedures (including critical care time)  Medications Ordered in ED Medications  aspirin chewable tablet 324 mg (324 mg Oral Given 10/27/17 1546)     Initial Impression / Assessment and Plan / ED Course  I have reviewed the triage vital signs and the nursing notes.  Pertinent labs & imaging results that were available during my care of the patient were reviewed by me and considered in my medical decision making (see chart for details).    HEAR score 2. Patient is to be discharged with recommendation to follow up with PCP in regards to today's hospital visit. Chest pain is not likely of cardiac or pulmonary etiology d/t presentation, PERC negative, VSS, no tracheal deviation, no JVD or new murmur, RRR, breath sounds equal bilaterally, EKG without acute abnormalities, negative troponin, and negative CXR. Pt has been advised to return to the ED if CP becomes exertional, associated with diaphoresis or nausea, worsens or becomes concerning in any way. His blood pressure was elevated in the ER today, also counseled him to have this rechecked by his PCP. Pt appears reliable for follow up and is agreeable to discharge.    Case has been discussed with by Dr. Tyrone Nine who agrees with the above plan to discharge.   Final Clinical Impressions(s) / ED Diagnoses   Final diagnoses:  Chest pain,  unspecified type    ED Discharge Orders    None       Bernarda Caffey 10/27/17 [place], DO 10/27/17 2304

## 2017-10-28 ENCOUNTER — Other Ambulatory Visit (INDEPENDENT_AMBULATORY_CARE_PROVIDER_SITE_OTHER): Payer: 59

## 2017-10-28 ENCOUNTER — Encounter: Payer: Self-pay | Admitting: Family

## 2017-10-28 ENCOUNTER — Ambulatory Visit: Payer: Self-pay | Admitting: *Deleted

## 2017-10-28 ENCOUNTER — Ambulatory Visit (INDEPENDENT_AMBULATORY_CARE_PROVIDER_SITE_OTHER): Payer: 59 | Admitting: Family

## 2017-10-28 VITALS — BP 158/98 | HR 95 | Temp 98.5°F | Ht 69.0 in | Wt 293.0 lb

## 2017-10-28 DIAGNOSIS — I10 Essential (primary) hypertension: Secondary | ICD-10-CM

## 2017-10-28 DIAGNOSIS — R0789 Other chest pain: Secondary | ICD-10-CM

## 2017-10-28 DIAGNOSIS — R635 Abnormal weight gain: Secondary | ICD-10-CM

## 2017-10-28 LAB — HEMOGLOBIN A1C: HEMOGLOBIN A1C: 5.3 % (ref 4.6–6.5)

## 2017-10-28 MED ORDER — CYCLOBENZAPRINE HCL 10 MG PO TABS: 10.0000 mg | ORAL_TABLET | Freq: Two times a day (BID) | ORAL | 0 refills | Status: DC | PRN

## 2017-10-28 NOTE — Progress Notes (Signed)
Joshua Parrish is a 42 y.o. male with the following history as recorded in EpicCare:  Patient Active Problem List   Diagnosis Date Noted  . Bilateral shoulder pain 05/22/2017  . Left knee pain 05/22/2017  . Urinary frequency 02/09/2017  . Hypertension 01/16/2017  . Migraine with aura and without status migrainosus, not intractable 01/02/2017  . Prostatitis 12/25/2016  . Sepsis ([place]) 12/25/2016  . GERD (gastroesophageal reflux disease) 12/25/2016  . Sinusitis 12/09/2016  . Tendinitis of right rotator cuff 12/09/2016  . Laryngopharyngeal reflux (LPR) 02/04/2016  . Mass in neck 11/27/2015  . Rotator cuff impingement syndrome of left shoulder 06/19/2015  . Acromioclavicular joint arthritis 06/19/2015    Current Outpatient Medications  Medication Sig Dispense Refill  . amLODipine (NORVASC) 5 MG tablet Take 1 tablet (5 mg total) daily by mouth. 30 tablet 3  . gabapentin (NEURONTIN) 300 MG capsule Take 1 capsule (300 mg total) by mouth 3 (three) times daily. 90 capsule 1  . lisinopril-hydrochlorothiazide (PRINZIDE,ZESTORETIC) 20-12.5 MG tablet Take 1 tablet by mouth daily. 90 tablet 1  . meloxicam (MOBIC) 15 MG tablet Take 1 tablet (15 mg total) by mouth daily as needed for pain. 90 tablet 0  . OVER THE COUNTER MEDICATION Take 1 capsule by mouth 2 (two) times daily. [place]    . OVER THE COUNTER MEDICATION Take 1 capsule by mouth 2 (two) times daily. CELLUCOR C4    . pantoprazole (PROTONIX) 40 MG tablet Take 1 tablet (40 mg total) 2 (two) times daily by mouth. 180 tablet 0  . phenazopyridine (PYRIDIUM) 100 MG tablet Take 1-2 tablets (100-200 mg total) by mouth 3 (three) times daily as needed for pain. 20 tablet 0  . SUMAtriptan (IMITREX) 100 MG tablet Take 0.5-1 tablet by mouth daily as needed for headache. May repeat in 2 hours if headache persists or recurs. 10 tablet 0  . cyclobenzaprine (FLEXERIL) 10 MG tablet Take 1 tablet (10 mg total) by mouth 2 (two) times daily as needed  for muscle spasms. 20 tablet 0   No current facility-administered medications for this visit.     Allergies: Food  Past Medical History:  Diagnosis Date  . Chicken pox   . Depression   . Fatty liver   . GERD (gastroesophageal reflux disease)   . Migraines   . Osteoarthritis of shoulder    left  . Rotator cuff impingement syndrome of left shoulder 06/19/2015  . Sinusitis     Past Surgical History:  Procedure Laterality Date  . APPENDECTOMY    . CERVICAL LAMINECTOMY     has plates and fusion  . CHOLECYSTECTOMY N/A 12/24/2015   Procedure: LAPAROSCOPIC CHOLECYSTECTOMY ;  Surgeon: [name] Pickerel, MD;  Location: [facility];  Service: General;  Laterality: N/A;  . CLAVICLE EXCISION Left   . HYPOSPADIAS CORRECTION     as child  . KNEE SURGERY Bilateral    multiple  . SHOULDER ARTHROSCOPY WITH ROTATOR CUFF REPAIR Left 06/26/2015   Procedure: SHOULDER ARTHROSCOPY WITH ROTATOR CUFF REPAIR;  Surgeon: Elsie Saas, MD;  Location: [facility];  Service: Orthopedics;  Laterality: Left;    Family History  Problem Relation Age of Onset  . Arthritis Mother   . Ovarian cancer Mother   . Alcohol abuse Father   . Breast cancer Maternal Grandmother   . Cancer Maternal Grandmother        spinal surgery  . Breast cancer Maternal Grandfather   . Cancer Other   . Hypertension Other   .  Diabetes Other   . Colon cancer Neg Hx     Social History   Tobacco Use  . Smoking status: Never Smoker  . Smokeless tobacco: Current User    Types: Chew  . Tobacco comment: form given 11/29/15  Substance Use Topics  . Alcohol use: Yes    Alcohol/week: 0.0 oz    Comment: occasional    Subjective:  Started on Sunday with sensation of chest pain while sitting in church; notes that the pain started after stretching out his arms; patient does have chronic left shoulder pain/ issues- has torn his rotator cuff on 2 separate occasions; became concerned when the pain persisted and went to the ER  yesterday with the pain; had normal EKG, labs and CXR; was told to follow up with his PCP if symptoms persisted; he notes that pain has continued to "come and go" all day; denies any chest pain or shortness of breath on exertion; has tried taking Mobic with some relief; readily admits that he has not been and does not take his blood pressure medication regularly due to personal finance struggles; has not been on his medication at least for the past 3 days; does have history of GERD- is taking his Protonix as prescribed and denies any concerns for uncontrolled reflux today;  Objective:  Vitals:   10/28/17 1636  BP: (!) 158/98  Pulse: 95  Temp: 98.5 F (36.9 C)  TempSrc: Oral  SpO2: 98%  Weight: 293 lb (132.9 kg)  Height: 5\' 9"  (1.753 m)    General: Well developed, well nourished, in no acute distress  Skin : Warm and dry.  Head: Normocephalic and atraumatic  Lungs: Respirations unlabored; clear to auscultation bilaterally without wheeze, rales, rhonchi  CVS exam: normal rate and regular rhythm.  Musculoskeletal: No deformities; no active joint inflammation; notes that left chest wall is tender to palpation Extremities: No edema, cyanosis, clubbing  Vessels: Symmetric bilaterally  Neurologic: Alert and oriented; speech intact; face symmetrical; moves all extremities well; CNII-XII intact without focal deficit  Assessment:  1. Essential hypertension   2. Atypical chest pain   3. Weight gain     Plan:  1. Uncontrolled; patient is not taking his medication; stressed need to take medication as prescribed; follow-up in 2 weeks on his medication; 2. Suspect muscular source and combination of uncontrolled hypertension; reviewed EKG, CXR and labs done at ER yesterday; repeat EKG done today- NSR; trial of Flexeril 10 mg bid prn; okay to continue Mobic prn; 3. Check TSH, Hgba1c; encouraged healthy eating/ exercise.    Return in about 2 weeks (around 11/11/2017) for blood pressure follow-up.   Orders Placed This Encounter  Procedures  . TSH    Standing Status:   Future    Number of Occurrences:   1    Standing Expiration Date:   10/28/2018  . HgB A1c    Standing Status:   Future    Number of Occurrences:   1    Standing Expiration Date:   10/28/2018  . D-Dimer, Quantitative    Standing Status:   Future    Number of Occurrences:   1    Standing Expiration Date:   10/28/2018  . EKG 12-Lead    Requested Prescriptions   Signed Prescriptions Disp Refills  . cyclobenzaprine (FLEXERIL) 10 MG tablet 20 tablet 0    Sig: Take 1 tablet (10 mg total) by mouth 2 (two) times daily as needed for muscle spasms.

## 2017-10-28 NOTE — Telephone Encounter (Signed)
Called in c/o continued left chest pain with pain radiating into his left bicep.   He was seen at [facility] ED yesterday and a "heart attack was ruled out".     Having some dizziness with these chest tightness.  I called the flow coordinator at [facility] at [facility] and touched base with her regarding his situation.   Jodi Mourning, NP has agreed to see him unless his symptoms become worse between now and his 4:00 appt in that case he is to go to the ED NOT wait for his 4:00 appt.   He verbalized understanding and agreed to these instructions.    So his appt is with Jodi Mourning, NP today at 4:00.  Reason for Disposition . Pain also present in shoulder(s) or arm(s) or jaw  (Exception: pain is clearly made worse by movement)  Answer Assessment - Initial Assessment Questions 1. LOCATION: "Where does it hurt?"       On Sunday I had chest pain on the left side.   Yesterday I went to the ED and they told me I did not have a heart attack.   To follow up with my doctor.    Left pectorial muscle.   It's a constant, throbbing pain like someone is squeezing it and then it goes away.   It comes and goes. 2. RADIATION: "Does the pain go anywhere else?" (e.g., into neck, jaw, arms, back)     I feel pain in my left bicep and my right kidney in the back.    On Sunday it felt like I could not move my jaw like it locked up.   3. ONSET: "When did the chest pain begin?" (Minutes, hours or days)      This all began on Sunday. 4. PATTERN "Does the pain come and go, or has it been constant since it started?"  "Does it get worse with exertion?"      It comes and goes.   This squeezing sensation. 5. DURATION: "How long does it last" (e.g., seconds, minutes, hours)     They last about 10 minutes.   It happened in church on Sunday morning. 6. SEVERITY: "How bad is the pain?"  (e.g., Scale 1-10; mild, moderate, or severe)    - MILD (1-3): doesn't interfere with normal activities     - MODERATE (4-7): interferes  with normal activities or awakens from sleep    - SEVERE (8-10): excruciating pain, unable to do any normal activities       It was 9 on Sunday.    This morning it's a steady 4-5 on the pain scale. 7. CARDIAC RISK FACTORS: "Do you have any history of heart problems or risk factors for heart disease?" (e.g., prior heart attack, angina; high blood pressure, diabetes, being overweight, high cholesterol, smoking, or strong family history of heart disease)     No heart problems.   Except being seen in the ED on Tuesday. 8. PULMONARY RISK FACTORS: "Do you have any history of lung disease?"  (e.g., blood clots in lung, asthma, emphysema, birth control pills)     No 9. CAUSE: "What do you think is causing the chest pain?"     I have no idea.    It usually happens the worst when I'm sitting.   10. OTHER SYMPTOMS: "Do you have any other symptoms?" (e.g., dizziness, nausea, vomiting, sweating, fever, difficulty breathing, cough)       No appetite, no vomiting or diarrhea.   I  do get dizzy when it hits.   I was dizzy bad the other night when I was walking to the sofa.   I get cold chills when it happens.   No difficulty breathing.    11. PREGNANCY: "Is there any chance you are pregnant?" "When was your last menstrual period?"       N/A  Protocols used: CHEST PAIN-A-AH

## 2017-10-28 NOTE — Telephone Encounter (Signed)
(  Routing to [place] as an [place])

## 2017-10-29 LAB — D-DIMER, QUANTITATIVE: D-Dimer, Quant: 0.46 mcg/mL FEU (ref <0.50)

## 2017-10-29 LAB — TSH: TSH: 1.95 u[IU]/mL (ref 0.35–4.50)

## 2017-11-13 ENCOUNTER — Ambulatory Visit: Payer: Self-pay | Admitting: Family

## 2017-11-20 ENCOUNTER — Encounter: Payer: Self-pay | Admitting: Family

## 2017-11-20 ENCOUNTER — Ambulatory Visit: Payer: 59 | Admitting: Family

## 2017-11-20 DIAGNOSIS — G8929 Other chronic pain: Secondary | ICD-10-CM | POA: Diagnosis not present

## 2017-11-20 DIAGNOSIS — F418 Other specified anxiety disorders: Secondary | ICD-10-CM

## 2017-11-20 DIAGNOSIS — I10 Essential (primary) hypertension: Secondary | ICD-10-CM

## 2017-11-20 MED ORDER — LISINOPRIL-HYDROCHLOROTHIAZIDE 20-12.5 MG PO TABS: 2.0000 | ORAL_TABLET | Freq: Every day | ORAL | 1 refills | Status: DC

## 2017-11-20 MED ORDER — DULOXETINE HCL 30 MG PO CPEP
30.0000 mg | ORAL_CAPSULE | Freq: Every day | ORAL | 1 refills | Status: DC
Start: 2017-11-20 — End: 2017-11-23

## 2017-11-20 NOTE — Progress Notes (Signed)
Joshua Parrish is a 42 y.o. male with the following history as recorded in EpicCare:  Patient Active Problem List   Diagnosis Date Noted  . Bilateral shoulder pain 05/22/2017  . Left knee pain 05/22/2017  . Urinary frequency 02/09/2017  . Hypertension 01/16/2017  . Migraine with aura and without status migrainosus, not intractable 01/02/2017  . Prostatitis 12/25/2016  . Sepsis ([place]) 12/25/2016  . GERD (gastroesophageal reflux disease) 12/25/2016  . Sinusitis 12/09/2016  . Tendinitis of right rotator cuff 12/09/2016  . Laryngopharyngeal reflux (LPR) 02/04/2016  . Mass in neck 11/27/2015  . Rotator cuff impingement syndrome of left shoulder 06/19/2015  . Acromioclavicular joint arthritis 06/19/2015    Current Outpatient Medications  Medication Sig Dispense Refill  . amLODipine (NORVASC) 5 MG tablet Take 1 tablet (5 mg total) daily by mouth. 30 tablet 3  . cyclobenzaprine (FLEXERIL) 10 MG tablet Take 1 tablet (10 mg total) by mouth 2 (two) times daily as needed for muscle spasms. 20 tablet 0  . gabapentin (NEURONTIN) 300 MG capsule Take 1 capsule (300 mg total) by mouth 3 (three) times daily. 90 capsule 1  . lisinopril-hydrochlorothiazide (PRINZIDE,ZESTORETIC) 20-12.5 MG tablet Take 2 tablets by mouth daily. 180 tablet 1  . meloxicam (MOBIC) 15 MG tablet Take 1 tablet (15 mg total) by mouth daily as needed for pain. 90 tablet 0  . OVER THE COUNTER MEDICATION Take 1 capsule by mouth 2 (two) times daily. [place]    . OVER THE COUNTER MEDICATION Take 1 capsule by mouth 2 (two) times daily. CELLUCOR C4    . pantoprazole (PROTONIX) 40 MG tablet Take 1 tablet (40 mg total) 2 (two) times daily by mouth. 180 tablet 0  . phenazopyridine (PYRIDIUM) 100 MG tablet Take 1-2 tablets (100-200 mg total) by mouth 3 (three) times daily as needed for pain. 20 tablet 0  . SUMAtriptan (IMITREX) 100 MG tablet Take 0.5-1 tablet by mouth daily as needed for headache. May repeat in 2 hours if headache  persists or recurs. 10 tablet 0  . DULoxetine (CYMBALTA) 30 MG capsule Take 1 capsule (30 mg total) by mouth daily. Increase to twice a day after 1 week 60 capsule 1   No current facility-administered medications for this visit.     Allergies: Food  Past Medical History:  Diagnosis Date  . Chicken pox   . Depression   . Fatty liver   . GERD (gastroesophageal reflux disease)   . Migraines   . Osteoarthritis of shoulder    left  . Rotator cuff impingement syndrome of left shoulder 06/19/2015  . Sinusitis     Past Surgical History:  Procedure Laterality Date  . APPENDECTOMY    . CERVICAL LAMINECTOMY     has plates and fusion  . CHOLECYSTECTOMY N/A 12/24/2015   Procedure: LAPAROSCOPIC CHOLECYSTECTOMY ;  Surgeon: [name] Pickerel, MD;  Location: [facility];  Service: General;  Laterality: N/A;  . CLAVICLE EXCISION Left   . HYPOSPADIAS CORRECTION     as child  . KNEE SURGERY Bilateral    multiple  . SHOULDER ARTHROSCOPY WITH ROTATOR CUFF REPAIR Left 06/26/2015   Procedure: SHOULDER ARTHROSCOPY WITH ROTATOR CUFF REPAIR;  Surgeon: Elsie Saas, MD;  Location: [facility];  Service: Orthopedics;  Laterality: Left;    Family History  Problem Relation Age of Onset  . Arthritis Mother   . Ovarian cancer Mother   . Alcohol abuse Father   . Breast cancer Maternal Grandmother   . Cancer  Maternal Grandmother        spinal surgery  . Breast cancer Maternal Grandfather   . Cancer Other   . Hypertension Other   . Diabetes Other   . Colon cancer Neg Hx     Social History   Tobacco Use  . Smoking status: Never Smoker  . Smokeless tobacco: Current User    Types: Chew  . Tobacco comment: form given 11/29/15  Substance Use Topics  . Alcohol use: Yes    Alcohol/week: 0.0 oz    Comment: occasional    Subjective:  2 week follow-up on hypertension; has been taking his medication regularly; feeling better but still frustrated because numbers are still not where they should be;  Denies any shortness of breath, blurred vision or headache. Admits that stress level is very high- feels most likely work related; would like to discuss medication options; notes that family members are very prone to anxiety/ depression; does also have chronic pain/ neuropathy due to arthritis/ numerous orthopedic surgeries.    Objective:  Vitals:   11/20/17 0810  BP: (!) 138/92  Pulse: 98  Temp: 98.2 F (36.8 C)  TempSrc: Oral  SpO2: 100%  Weight: 292 lb 0.6 oz (132.5 kg)  Height: 5\' 9"  (1.753 m)    General: Well developed, well nourished, in no acute distress  Skin : Warm and dry.  Head: Normocephalic and atraumatic  Lungs: Respirations unlabored; clear to auscultation bilaterally without wheeze, rales, rhonchi  CVS exam: normal rate and regular rhythm.  Neurologic: Alert and oriented; speech intact; face symmetrical; moves all extremities well; CNII-XII intact without focal deficit   Assessment:  1. Essential hypertension   2. Situational anxiety   3. Other chronic pain     Plan:  1. Uncontrolled; increase Lisinopril HCT to 2 tablets daily; continue Amlodipine; follow-up in 1 month, sooner prn. 2. & 3. Trial of Cymbalta 30 mg x 1 week- increase to 60 mg after first week; risks and benefits discussed; follow- up in 1 month.   Return in about 1 month (around 12/18/2017).  No orders of the defined types were placed in this encounter.   Requested Prescriptions   Signed Prescriptions Disp Refills  . DULoxetine (CYMBALTA) 30 MG capsule 60 capsule 1    Sig: Take 1 capsule (30 mg total) by mouth daily. Increase to twice a day after 1 week  . lisinopril-hydrochlorothiazide (PRINZIDE,ZESTORETIC) 20-12.5 MG tablet 180 tablet 1    Sig: Take 2 tablets by mouth daily.

## 2017-11-23 ENCOUNTER — Other Ambulatory Visit: Payer: Self-pay | Admitting: Family

## 2017-11-23 ENCOUNTER — Telehealth: Payer: Self-pay | Admitting: Family

## 2017-11-23 MED ORDER — DULOXETINE HCL 30 MG PO CPEP: 30.0000 mg | ORAL_CAPSULE | Freq: Every day | ORAL | 1 refills | Status: DC

## 2017-11-23 NOTE — Telephone Encounter (Signed)
His insurance won't pay for the Cymbalta the way I wrote it last week. I think they will pay for it at 30 mg daily; sent that new Rx in for him; let us know if he can't get this either.

## 2017-11-24 NOTE — Telephone Encounter (Signed)
Called and left message for patient this morning. Also created CRM incase he returns call to clinic.

## 2017-12-21 ENCOUNTER — Encounter: Payer: Self-pay | Admitting: Family

## 2017-12-21 ENCOUNTER — Other Ambulatory Visit: Payer: Self-pay

## 2017-12-21 ENCOUNTER — Other Ambulatory Visit (INDEPENDENT_AMBULATORY_CARE_PROVIDER_SITE_OTHER): Payer: 59

## 2017-12-21 ENCOUNTER — Ambulatory Visit (INDEPENDENT_AMBULATORY_CARE_PROVIDER_SITE_OTHER): Payer: 59 | Admitting: Family

## 2017-12-21 VITALS — BP 142/90 | HR 110 | Temp 98.2°F | Ht 69.0 in | Wt 285.1 lb

## 2017-12-21 DIAGNOSIS — F329 Major depressive disorder, single episode, unspecified: Secondary | ICD-10-CM | POA: Diagnosis not present

## 2017-12-21 DIAGNOSIS — K219 Gastro-esophageal reflux disease without esophagitis: Secondary | ICD-10-CM | POA: Diagnosis not present

## 2017-12-21 DIAGNOSIS — R1010 Upper abdominal pain, unspecified: Secondary | ICD-10-CM

## 2017-12-21 DIAGNOSIS — R350 Frequency of micturition: Secondary | ICD-10-CM

## 2017-12-21 DIAGNOSIS — F32A Depression, unspecified: Secondary | ICD-10-CM

## 2017-12-21 DIAGNOSIS — I10 Essential (primary) hypertension: Secondary | ICD-10-CM | POA: Diagnosis not present

## 2017-12-21 DIAGNOSIS — F419 Anxiety disorder, unspecified: Secondary | ICD-10-CM | POA: Diagnosis not present

## 2017-12-21 LAB — URINALYSIS
BILIRUBIN URINE: NEGATIVE
HGB URINE DIPSTICK: NEGATIVE
Ketones, ur: NEGATIVE
LEUKOCYTES UA: NEGATIVE
NITRITE: NEGATIVE
PH: 6 (ref 5.0–8.0)
Specific Gravity, Urine: 1.02 (ref 1.000–1.030)
Total Protein, Urine: NEGATIVE
Urine Glucose: NEGATIVE
Urobilinogen, UA: 0.2 (ref 0.0–1.0)

## 2017-12-21 LAB — COMPREHENSIVE METABOLIC PANEL
ALK PHOS: 75 U/L (ref 39–117)
ALT: 51 U/L (ref 0–53)
AST: 23 U/L (ref 0–37)
Albumin: 4.6 g/dL (ref 3.5–5.2)
BUN: 16 mg/dL (ref 6–23)
CHLORIDE: 98 meq/L (ref 96–112)
CO2: 30 mEq/L (ref 19–32)
Calcium: 10 mg/dL (ref 8.4–10.5)
Creatinine, Ser: 1.08 mg/dL (ref 0.40–1.50)
GFR: 79.45 mL/min (ref >60.00)
GLUCOSE: 82 mg/dL (ref 70–99)
POTASSIUM: 4.3 meq/L (ref 3.5–5.1)
Sodium: 137 mEq/L (ref 135–145)
TOTAL PROTEIN: 7 g/dL (ref 6.0–8.3)
Total Bilirubin: 0.4 mg/dL (ref 0.2–1.2)

## 2017-12-21 LAB — CBC WITH DIFFERENTIAL/PLATELET
BASOS ABS: 0.1 10*3/uL (ref 0.0–0.1)
Basophils Relative: 0.5 % (ref 0.0–3.0)
Eosinophils Absolute: 0 10*3/uL (ref 0.0–0.7)
Eosinophils Relative: 0.3 % (ref 0.0–5.0)
HEMATOCRIT: 39.8 % (ref 39.0–52.0)
Hemoglobin: 13.5 g/dL (ref 13.0–17.0)
LYMPHS PCT: 29.5 % (ref 12.0–46.0)
Lymphs Abs: 3 10*3/uL (ref 0.7–4.0)
MCHC: 34 g/dL (ref 30.0–36.0)
MCV: 87.6 fl (ref 78.0–100.0)
Monocytes Absolute: 0.6 10*3/uL (ref 0.1–1.0)
Monocytes Relative: 5.8 % (ref 3.0–12.0)
Neutro Abs: 6.4 10*3/uL (ref 1.4–7.7)
Neutrophils Relative %: 63.9 % (ref 43.0–77.0)
Platelets: 333 10*3/uL (ref 150.0–400.0)
RBC: 4.55 Mil/uL (ref 4.22–5.81)
RDW: 13.2 % (ref 11.5–15.5)
WBC: 10.1 10*3/uL (ref 4.0–10.5)

## 2017-12-21 MED ORDER — CYCLOBENZAPRINE HCL 10 MG PO TABS: 10.0000 mg | ORAL_TABLET | Freq: Two times a day (BID) | ORAL | 1 refills | Status: DC | PRN

## 2017-12-21 MED ORDER — AMLODIPINE BESYLATE 5 MG PO TABS: 5.0000 mg | ORAL_TABLET | Freq: Every day | ORAL | 3 refills | Status: DC

## 2017-12-21 MED ORDER — ESCITALOPRAM OXALATE 10 MG PO TABS: 10.0000 mg | ORAL_TABLET | Freq: Every day | ORAL | 2 refills | Status: DC

## 2017-12-21 MED ORDER — RANITIDINE HCL 150 MG PO TABS: 150.0000 mg | ORAL_TABLET | Freq: Two times a day (BID) | ORAL | 1 refills | Status: DC

## 2017-12-21 MED ORDER — PANTOPRAZOLE SODIUM 40 MG PO TBEC: 40.0000 mg | DELAYED_RELEASE_TABLET | Freq: Two times a day (BID) | ORAL | 0 refills | Status: DC

## 2017-12-21 NOTE — Progress Notes (Signed)
Joshua Parrish is a 42 y.o. male with the following history as recorded in EpicCare:  Patient Active Problem List   Diagnosis Date Noted  . Bilateral shoulder pain 05/22/2017  . Left knee pain 05/22/2017  . Urinary frequency 02/09/2017  . Essential hypertension 01/16/2017  . Prostatitis 12/25/2016  . Sepsis ([place]) 12/25/2016  . GERD (gastroesophageal reflux disease) 12/25/2016  . Sinusitis 12/09/2016  . Tendinitis of right rotator cuff 12/09/2016  . Laryngopharyngeal reflux (LPR) 02/04/2016  . Mass in neck 11/27/2015  . Rotator cuff impingement syndrome of left shoulder 06/19/2015  . Acromioclavicular joint arthritis 06/19/2015    Current Outpatient Medications  Medication Sig Dispense Refill  . amLODipine (NORVASC) 5 MG tablet Take 1 tablet (5 mg total) by mouth daily. 30 tablet 3  . cyclobenzaprine (FLEXERIL) 10 MG tablet Take 1 tablet (10 mg total) by mouth 2 (two) times daily as needed for muscle spasms. 60 tablet 1  . gabapentin (NEURONTIN) 300 MG capsule Take 1 capsule (300 mg total) by mouth 3 (three) times daily. 90 capsule 1  . lisinopril-hydrochlorothiazide (PRINZIDE,ZESTORETIC) 20-12.5 MG tablet Take 2 tablets by mouth daily. 180 tablet 1  . pantoprazole (PROTONIX) 40 MG tablet Take 1 tablet (40 mg total) by mouth 2 (two) times daily. 180 tablet 0  . escitalopram (LEXAPRO) 10 MG tablet Take 1 tablet (10 mg total) by mouth daily. 30 tablet 2  . ranitidine (ZANTAC) 150 MG tablet Take 1 tablet (150 mg total) by mouth 2 (two) times daily. 60 tablet 1   No current facility-administered medications for this visit.     Allergies: Food  Past Medical History:  Diagnosis Date  . Chicken pox   . Depression   . Fatty liver   . GERD (gastroesophageal reflux disease)   . Migraines   . Osteoarthritis of shoulder    left  . Rotator cuff impingement syndrome of left shoulder 06/19/2015  . Sinusitis     Past Surgical History:  Procedure Laterality Date  . APPENDECTOMY    . CERVICAL  LAMINECTOMY     has plates and fusion  . CHOLECYSTECTOMY N/A 12/24/2015   Procedure: LAPAROSCOPIC CHOLECYSTECTOMY ;  Surgeon: [name] Pickerel, MD;  Location: [facility];  Service: General;  Laterality: N/A;  . CLAVICLE EXCISION Left   . HYPOSPADIAS CORRECTION     as child  . KNEE SURGERY Bilateral    multiple  . SHOULDER ARTHROSCOPY WITH ROTATOR CUFF REPAIR Left 06/26/2015   Procedure: SHOULDER ARTHROSCOPY WITH ROTATOR CUFF REPAIR;  Surgeon: Elsie Saas, MD;  Location: [facility];  Service: Orthopedics;  Laterality: Left;    Family History  Problem Relation Age of Onset  . Arthritis Mother   . Ovarian cancer Mother   . Alcohol abuse Father   . Breast cancer Maternal Grandmother   . Cancer Maternal Grandmother        spinal surgery  . Breast cancer Maternal Grandfather   . Cancer Other   . Hypertension Other   . Diabetes Other   . Colon cancer Neg Hx     Social History   Tobacco Use  . Smoking status: Never Smoker  . Smokeless tobacco: Current User    Types: Chew  . Tobacco comment: form given 11/29/15  Substance Use Topics  . Alcohol use: Yes    Alcohol/week: 0.0 oz    Comment: occasional    Subjective:  Presents for one month follow-up on chronic care needs: 1) HTN; 2)GERD 3) Anxiety; 4) Chronic left  shoulder pain Of note, patient has been off his Amlodipine and Protonix for the past week; notes that GERD has become very painful in the past few days; Denies any chest pain, shortness of breath, blurred vision or headache Has not seen any benefit with anxiety on 60 mg of Cymbalta- has not helped with pain either; does feel like he has been having increased "abdominal spasms" since starting Cymbalta; s/p cholecystectomy;   Objective:  Vitals:   12/21/17 1558  BP: (!) 142/90  Pulse: (!) 110  Temp: 98.2 F (36.8 C)  TempSrc: Oral  SpO2: 97%  Weight: 285 lb 1.9 oz (129.3 kg)  Height: _0  (1.753 m)    General: Well developed, well nourished, in no acute  distress  Skin : Warm and dry.  Head: Normocephalic and atraumatic  Lungs: Respirations unlabored; clear to auscultation bilaterally without wheeze, rales, rhonchi  CVS exam: normal rate and regular rhythm.  Abdomen: Soft; nontender; nondistended; normoactive bowel sounds; no masses or hepatosplenomegaly  Musculoskeletal: No deformities; no active joint inflammation  Extremities: No edema, cyanosis, clubbing  Vessels: Symmetric bilaterally  Neurologic: Alert and oriented; speech intact; face symmetrical; moves all extremities well; CNII-XII intact without focal deficit  Assessment:  1. Essential hypertension   2. Gastroesophageal reflux disease, esophagitis presence not specified   3. Urinary frequency   4. Pain of upper abdomen   5. Anxiety and depression     Plan:  1. Suspect uncontrolled as has been of Amlodipine x 1 week; notes he has been feeling better on increased dose of Lisinopril HCT; call back in 1-2 weeks with blood pressure readings once he is back on his medication; 2. Refill on Protonix 40 mg bid; okay to use Zantac 150 mg bid as needed also; 3. 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 Feel related to back arthritis but will update abdominal ultrasound; follow-up to be determined; 5. Limited response to Cymbalta- taper off this medication and change to Lexapro 10 mg daily; follow-up in 6-8 weeks;  He is also encouraged to schedule follow-up with sports medicine provider about left shoulder pain.  No follow-ups on file.  Orders Placed This Encounter  Procedures  . US Abdomen Complete    Standing Status:   Future    Standing Expiration Date:   02/21/2019    Order Specific Question:   Reason for Exam (SYMPTOM  OR DIAGNOSIS REQUIRED)    Answer:   abdominal pain    Order Specific Question:   Preferred imaging location?    Answer:   [facility]  . PSA    Standing Status:   Future    Number  of Occurrences:   1    Standing Expiration Date:   12/21/2018  . CBC w/Diff    Standing Status:   Future    Number of Occurrences:   1    Standing Expiration Date:   12/21/2018  . Urinalysis    Standing Status:   Future    Number of Occurrences:   1    Standing Expiration Date:   12/21/2018  . Comp Met (CMET)    Standing Status:   Future    Number of Occurrences:   1    Standing Expiration Date:   12/21/2018    Requested Prescriptions   Signed Prescriptions Disp Refills  . ranitidine (ZANTAC) 150 MG tablet 60 tablet 1    Sig: Take 1 tablet (150 mg total)  by mouth 2 (two) times daily.  Marland Kitchen amLODipine (NORVASC) 5 MG tablet 30 tablet 3    Sig: Take 1 tablet (5 mg total) by mouth daily.  . pantoprazole (PROTONIX) 40 MG tablet 180 tablet 0    Sig: Take 1 tablet (40 mg total) by mouth 2 (two) times daily.  Marland Kitchen escitalopram (LEXAPRO) 10 MG tablet 30 tablet 2    Sig: Take 1 tablet (10 mg total) by mouth daily.  . cyclobenzaprine (FLEXERIL) 10 MG tablet 60 tablet 1    Sig: Take 1 tablet (10 mg total) by mouth 2 (two) times daily as needed for muscle spasms.

## 2017-12-21 NOTE — Patient Instructions (Signed)
Please taper off the Cymbalta; cut back to one per day x 5 days and then every other day x 5 days;   Then after 24 hours, start new medication; will give you a trial of Lexapro for the anxiety; take one tablet per day;

## 2017-12-22 LAB — PSA: PSA: 0.17 ng/mL (ref 0.10–4.00)

## 2017-12-24 ENCOUNTER — Encounter: Payer: Self-pay | Admitting: Family Medicine

## 2017-12-24 ENCOUNTER — Ambulatory Visit: Payer: 59 | Admitting: Family Medicine

## 2017-12-24 VITALS — BP 138/74 | HR 101 | Temp 98.8°F | Ht 69.0 in | Wt 284.0 lb

## 2017-12-24 DIAGNOSIS — M25512 Pain in left shoulder: Secondary | ICD-10-CM

## 2017-12-24 MED ORDER — NITROGLYCERIN 0.2 MG/HR TD PT24: MEDICATED_PATCH | TRANSDERMAL | 11 refills | Status: DC

## 2017-12-24 NOTE — Patient Instructions (Signed)
Nitroglycerin Protocol   Apply 1/4 nitroglycerin patch to affected area daily.  Change position of patch within the affected area every 24 hours.  You may experience a headache during the first 1-2 weeks of using the patch, these should subside.  If you experience headaches after beginning nitroglycerin patch treatment, you may take your preferred over the counter pain reliever.  Another side effect of the nitroglycerin patch is skin irritation or rash related to patch adhesive.  Please notify our office if you develop more severe headaches or rash, and stop the patch.  Tendon healing with nitroglycerin patch may require 12 to 24 weeks depending on the extent of injury.  Men should not use if taking Viagra, Cialis, or Levitra.   Do not use if you have migraines or rosacea.   Please try the exercises that I have provided  Please follow up with in 4-6 weeks.

## 2017-12-24 NOTE — Progress Notes (Signed)
Joshua Parrish - 42 y.o. male MRN [id number]  Date of birth: 08/16/1976  SUBJECTIVE:  Including CC & ROS.  Chief Complaint  Patient presents with  . Let shoulder issues    Joshua Parrish is a 42 y.o. male that is presenting with left shoulder pain. Pain has been ongoing for six months.  He has a history of 2 rotator cuff repairs.  He had initial surgery in 2016 and subsequent surgery in 2017.  He works on [employer] and is exacerbates his pain when he has to hold things above his head.  He also has a history of a cervical spine surgery.  He reports receiving 4 to 5 injections into his left shoulder over the course of a couple of years.  He reports the pain is worse when he holds his arm in an abducted manner.  He also has fasciculations of his triceps and his hand at certain times when he holds his arm in certain positions.  He denies any radiculopathy down his arm.  He does feel pain to his elbow.  The pain can be severe in nature.  The pain is throbbing.  He denies any recurrent injury.  He has a history of an acromioplasty and resection of the distal clavicle   Independent review of his left shoulder MRI from 2017 shows no labral pathology.  Does not show any rotator cuff or tear but does show the repair.  Review of Systems  Constitutional: Negative for fever.  HENT: Negative for congestion.   Respiratory: Negative for shortness of breath.   Cardiovascular: Negative for chest pain.  Gastrointestinal: Negative for abdominal pain.  Musculoskeletal: Positive for arthralgias.  Skin: Negative for color change.  Allergic/Immunologic: Negative for immunocompromised state.  Neurological: Positive for weakness.  Hematological: Negative for adenopathy.  Psychiatric/Behavioral: Negative for agitation.    HISTORY: Past Medical, Surgical, Social, and Family History Reviewed & Updated per EMR.   Pertinent Historical Findings include:  Past Medical History:  Diagnosis Date  . Chicken pox   .  Depression   . Fatty liver   . GERD (gastroesophageal reflux disease)   . Migraines   . Osteoarthritis of shoulder    left  . Rotator cuff impingement syndrome of left shoulder 06/19/2015  . Sinusitis     Past Surgical History:  Procedure Laterality Date  . APPENDECTOMY    . CERVICAL LAMINECTOMY     has plates and fusion  . CHOLECYSTECTOMY N/A 12/24/2015   Procedure: LAPAROSCOPIC CHOLECYSTECTOMY ;  Surgeon: [name] Pickerel, MD;  Location: [facility];  Service: General;  Laterality: N/A;  . CLAVICLE EXCISION Left   . HYPOSPADIAS CORRECTION     as child  . KNEE SURGERY Bilateral    multiple  . SHOULDER ARTHROSCOPY WITH ROTATOR CUFF REPAIR Left 06/26/2015   Procedure: SHOULDER ARTHROSCOPY WITH ROTATOR CUFF REPAIR;  Surgeon: Elsie Saas, MD;  Location: [facility];  Service: Orthopedics;  Laterality: Left;    Allergies  Allergen Reactions  . Food Diarrhea and Nausea And Vomiting    COCONUT    Family History  Problem Relation Age of Onset  . Arthritis Mother   . Ovarian cancer Mother   . Alcohol abuse Father   . Breast cancer Maternal Grandmother   . Cancer Maternal Grandmother        spinal surgery  . Breast cancer Maternal Grandfather   . Cancer Other   . Hypertension Other   . Diabetes Other   . Colon  cancer Neg Hx      Social History   Socioeconomic History  . Marital status: Married    Spouse name: Not on file  . Number of children: 1  . Years of education: 32  . Highest education level: Not on file  Occupational History  . Occupation: Paramedic   Social Needs  . Financial resource strain: Not on file  . Food insecurity:    Worry: Not on file    Inability: Not on file  . Transportation needs:    Medical: Not on file    Non-medical: Not on file  Tobacco Use  . Smoking status: Never Smoker  . Smokeless tobacco: Current User    Types: Chew  . Tobacco comment: form given 11/29/15  Substance and Sexual Activity  . Alcohol use: Yes      Alcohol/week: 0.0 oz    Comment: occasional  . Drug use: No  . Sexual activity: Yes  Lifestyle  . Physical activity:    Days per week: Not on file    Minutes per session: Not on file  . Stress: Not on file  Relationships  . Social connections:    Talks on phone: Not on file    Gets together: Not on file    Attends religious service: Not on file    Active member of club or organization: Not on file    Attends meetings of clubs or organizations: Not on file    Relationship status: Not on file  . Intimate partner violence:    Fear of current or ex partner: Not on file    Emotionally abused: Not on file    Physically abused: Not on file    Forced sexual activity: Not on file  Other Topics Concern  . Not on file  Social History Narrative   Fun: Lift weights, hike     PHYSICAL EXAM:  VS: BP 138/74 (BP Location: Left Arm, Patient Position: Sitting, Cuff Size: Normal)   Pulse (!) 101   Temp 98.8 F (37.1 C) (Oral)   Ht 5\' 9"  (1.753 m)   Wt 284 lb (128.8 kg)   SpO2 98%   BMI 41.94 kg/m  Physical Exam Gen: NAD, alert, cooperative with exam, well-appearing ENT: normal lips, normal nasal mucosa,  Eye: normal EOM, normal conjunctiva and lids CV:  no edema, +2 pedal pulses   Resp: no accessory muscle use, non-labored,  Skin: no rashes, no areas of induration  Neuro: normal tone, normal sensation to touch Psych:  normal insight, alert and oriented MSK:  Left shoulder: Limited range of motion and active flexion abduction to around 170 degrees. Limited passive range of motion as well. Pain with external rotation and abduction. Normal external rotation. Normal strength resistance with internal and external rotation. Triceps begins to fasciculate upon moving the shoulder in different range of motion exercises. Normal O'Brien's test. Normal empty can test. Normal speeds test. Neurovascularly intact.  Limited ultrasound: Left shoulder:  Normal-appearing biceps  tendon Anterior leaflet of the supraspinatus appears to be normal and intact Posterior leaflet of the supraspinatus that has changes to suggest prior repair Dynamic testing did not reveal any supraspinatus pull off of the greater tuberosity  Summary: Previous surgical changes of the supraspinatus.  Ultrasound and interpretation by Clearance Coots, MD          ASSESSMENT & PLAN:   Acute pain of left shoulder He has a complicated history of his left shoulder with 2 repairs in 2016 and 2017.  Also his work involves several Probation officer.  Pain is been ongoing.  Has had 4-5 steroid injections in the shoulder will try to avoid any further steroid injections.  Has been through physical therapy.  His posture is concave in nature.  Has some weakness and having fasciculations in the tricep in his hand.  Unclear if there is a nerve involvement. -Initiate nitro patches -Counseled on home exercise therapy.  Focusing on opening up his chest and working on scapular exercises -If no improvement can consider an EMG versus MRI of the left shoulder.

## 2017-12-25 DIAGNOSIS — M67912 Unspecified disorder of synovium and tendon, left shoulder: Secondary | ICD-10-CM | POA: Insufficient documentation

## 2017-12-25 NOTE — Assessment & Plan Note (Signed)
He has a complicated history of his left shoulder with 2 repairs in 2016 and 2017.  Also his work involves several Probation officer.  Pain is been ongoing.  Has had 4-5 steroid injections in the shoulder will try to avoid any further steroid injections.  Has been through physical therapy.  His posture is concave in nature.  Has some weakness and having fasciculations in the tricep in his hand.  Unclear if there is a nerve involvement. -Initiate nitro patches -Counseled on home exercise therapy.  Focusing on opening up his chest and working on scapular exercises -If no improvement can consider an EMG versus MRI of the left shoulder.

## 2017-12-26 ENCOUNTER — Emergency Department (HOSPITAL_COMMUNITY): Payer: 59

## 2017-12-26 ENCOUNTER — Emergency Department (HOSPITAL_COMMUNITY)
Admission: EM | Admit: 2017-12-26 | Discharge: 2017-12-26 | Disposition: A | Discharge status: Home / Self Care | Payer: 59 | Attending: Emergency Medicine | Admitting: Emergency Medicine

## 2017-12-26 ENCOUNTER — Encounter (HOSPITAL_COMMUNITY): Payer: Self-pay | Admitting: Emergency Medicine

## 2017-12-26 DIAGNOSIS — F17220 Nicotine dependence, chewing tobacco, uncomplicated: Secondary | ICD-10-CM | POA: Insufficient documentation

## 2017-12-26 DIAGNOSIS — S46912A Strain of unspecified muscle, fascia and tendon at shoulder and upper arm level, left arm, initial encounter: Secondary | ICD-10-CM | POA: Insufficient documentation

## 2017-12-26 DIAGNOSIS — Y92410 Unspecified street and highway as the place of occurrence of the external cause: Secondary | ICD-10-CM | POA: Insufficient documentation

## 2017-12-26 DIAGNOSIS — Y999 Unspecified external cause status: Secondary | ICD-10-CM | POA: Diagnosis not present

## 2017-12-26 DIAGNOSIS — Z79899 Other long term (current) drug therapy: Secondary | ICD-10-CM | POA: Diagnosis not present

## 2017-12-26 DIAGNOSIS — Y939 Activity, unspecified: Secondary | ICD-10-CM | POA: Insufficient documentation

## 2017-12-26 DIAGNOSIS — M542 Cervicalgia: Secondary | ICD-10-CM | POA: Diagnosis not present

## 2017-12-26 DIAGNOSIS — T148XXA Other injury of unspecified body region, initial encounter: Secondary | ICD-10-CM | POA: Diagnosis not present

## 2017-12-26 DIAGNOSIS — I10 Essential (primary) hypertension: Secondary | ICD-10-CM | POA: Diagnosis not present

## 2017-12-26 DIAGNOSIS — S4992XA Unspecified injury of left shoulder and upper arm, initial encounter: Secondary | ICD-10-CM | POA: Diagnosis not present

## 2017-12-26 DIAGNOSIS — M79603 Pain in arm, unspecified: Secondary | ICD-10-CM | POA: Diagnosis not present

## 2017-12-26 DIAGNOSIS — M25512 Pain in left shoulder: Secondary | ICD-10-CM | POA: Diagnosis not present

## 2017-12-26 MED ORDER — CYCLOBENZAPRINE HCL 10 MG PO TABS: 10.0000 mg | ORAL_TABLET | Freq: Two times a day (BID) | ORAL | 0 refills | Status: DC | PRN

## 2017-12-26 MED ORDER — KETOROLAC TROMETHAMINE 60 MG/2ML IM SOLN
60.0000 mg | Freq: Once | INTRAMUSCULAR | Status: AC
  Administered 2017-12-26: 60 mg via INTRAMUSCULAR
  Filled 2017-12-26: qty 2

## 2017-12-26 MED ORDER — IBUPROFEN 800 MG PO TABS: 800.0000 mg | ORAL_TABLET | Freq: Three times a day (TID) | ORAL | 0 refills | Status: DC
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
## 2017-12-26 NOTE — Discharge Instructions (Signed)
Your x-rays are normal showing no signs of fractures either of your neck or your shoulder.  Please continue taking ibuprofen 3 times daily, you have Flexeril prescribed to you, if you have run out I will give you an additional prescription.  Seek medical attention for severe or worsening symptoms but you should see your doctor in 1 week for follow-up.  Sling for comfort, ice and elevate
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
## 2017-12-29 ENCOUNTER — Ambulatory Visit (INDEPENDENT_AMBULATORY_CARE_PROVIDER_SITE_OTHER): Payer: 59 | Admitting: Family Medicine

## 2017-12-29 ENCOUNTER — Encounter: Payer: Self-pay | Admitting: Family Medicine

## 2017-12-29 DIAGNOSIS — M25512 Pain in left shoulder: Secondary | ICD-10-CM | POA: Diagnosis not present

## 2017-12-29 MED ORDER — PREDNISONE 5 MG PO TABS: ORAL_TABLET | ORAL | 0 refills | Status: DC
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
## 2017-12-29 NOTE — Patient Instructions (Signed)
Please hold the ibuprofen while taking the prednisone  Please hold off on the nitro and exercises for now.  Please follow up with me in 2 weeks.
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
## 2018-01-07 ENCOUNTER — Encounter: Payer: Self-pay | Admitting: Family

## 2018-01-21 ENCOUNTER — Encounter: Payer: Self-pay | Admitting: Family Medicine

## 2018-01-21 ENCOUNTER — Ambulatory Visit: Payer: 59 | Admitting: Family Medicine

## 2018-01-21 VITALS — BP 110/74 | HR 125 | Temp 98.4°F | Ht 69.0 in | Wt 291.0 lb

## 2018-01-21 DIAGNOSIS — M67912 Unspecified disorder of synovium and tendon, left shoulder: Secondary | ICD-10-CM

## 2018-01-21 DIAGNOSIS — M25512 Pain in left shoulder: Secondary | ICD-10-CM

## 2018-01-21 NOTE — Patient Instructions (Signed)
You can restart the nitro  We will call you to get the MRi scheduled and approved  Please avoid maneuvers that exacerbate your pain
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
## 2018-02-04 ENCOUNTER — Ambulatory Visit
Admission: RE | Admit: 2018-02-04 | Discharge: 2018-02-04 | Disposition: A | Discharge status: Home / Self Care | Payer: 59 | Source: Ambulatory Visit | Attending: Family Medicine | Admitting: Family Medicine

## 2018-02-04 DIAGNOSIS — S4992XA Unspecified injury of left shoulder and upper arm, initial encounter: Secondary | ICD-10-CM | POA: Diagnosis not present

## 2018-02-04 DIAGNOSIS — M25512 Pain in left shoulder: Secondary | ICD-10-CM | POA: Diagnosis not present

## 2018-02-08 ENCOUNTER — Telehealth: Payer: Self-pay | Admitting: Family Medicine

## 2018-02-08 NOTE — Telephone Encounter (Signed)
Left VM for patient. If he calls back please have him speak with a nurse/CMA and inform that his MRI didn't show any acute changes. We could consider an EMG as the next step or just wait and see how his shoulder does with restarting the nitro. The [facility] can report results to patient.   If any questions then please take the best time and phone number to call and I will try to call him back.   Rosemarie Ax, MD [facility] 02/08/2018, 4:49 PM

## 2018-02-18 ENCOUNTER — Ambulatory Visit (INDEPENDENT_AMBULATORY_CARE_PROVIDER_SITE_OTHER): Payer: 59 | Admitting: Family Medicine

## 2018-02-18 ENCOUNTER — Encounter: Payer: Self-pay | Admitting: Family Medicine

## 2018-02-18 VITALS — BP 138/78 | HR 106 | Temp 98.1°F | Ht 69.0 in | Wt 289.0 lb

## 2018-02-18 DIAGNOSIS — M67912 Unspecified disorder of synovium and tendon, left shoulder: Secondary | ICD-10-CM | POA: Diagnosis not present

## 2018-02-18 NOTE — Patient Instructions (Signed)
They will call to set up the nerve study  Please let me know if anything changes.

## 2018-02-18 NOTE — Progress Notes (Signed)
Joshua Parrish - 42 y.o. male MRN [id number]  Date of birth: 05-29-1976  SUBJECTIVE:  Including CC & ROS.  Chief Complaint  Patient presents with  . Follow-up    Joshua Parrish is a 42 y.o. male that is here today for left shoulder follow up. He reports his shoulder has not improved. He has been using the nitro patch daily with no improvement.  He reports that the pain occurs around the parascapular region as well as radiating underneath his axilla into his pack.  He also lifts weights and his triceps will spasm.  He denies any injury or change in his pain with the restart of the nitro patches.  He feels that his pain is occurring more frequently.  Review of the MRI from 6/6 shows an intact rotator cuff with minimal degenerative changes of the distal.  Independent review of cervical spine x-ray from 4/27 shows intact ACDF at C6-7.  Review of Systems  Constitutional: Negative for fever.  HENT: Negative for congestion.   Respiratory: Negative for cough.   Cardiovascular: Negative for chest pain.  Gastrointestinal: Negative for abdominal pain.  Musculoskeletal: Negative for arthralgias.  Skin: Negative for color change.  Neurological: Negative for weakness.  Hematological: Negative for adenopathy.  Psychiatric/Behavioral: Negative for agitation.    HISTORY: Past Medical, Surgical, Social, and Family History Reviewed & Updated per EMR.   Pertinent Historical Findings include:  Past Medical History:  Diagnosis Date  . Chicken pox   . Depression   . Fatty liver   . GERD (gastroesophageal reflux disease)   . Migraines   . Osteoarthritis of shoulder    left  . Rotator cuff impingement syndrome of left shoulder 06/19/2015  . Sinusitis     Past Surgical History:  Procedure Laterality Date  . APPENDECTOMY    . CERVICAL LAMINECTOMY     has plates and fusion  . CHOLECYSTECTOMY N/A 12/24/2015   Procedure: LAPAROSCOPIC CHOLECYSTECTOMY ;  Surgeon: [name] Pickerel, MD;  Location: [facility];  Service:  General;  Laterality: N/A;  . CLAVICLE EXCISION Left   . HYPOSPADIAS CORRECTION     as child  . KNEE SURGERY Bilateral    multiple  . SHOULDER ARTHROSCOPY WITH ROTATOR CUFF REPAIR Left 06/26/2015   Procedure: SHOULDER ARTHROSCOPY WITH ROTATOR CUFF REPAIR;  Surgeon: Elsie Saas, MD;  Location: [facility];  Service: Orthopedics;  Laterality: Left;    Allergies  Allergen Reactions  . Food Diarrhea and Nausea And Vomiting    COCONUT    Family History  Problem Relation Age of Onset  . Arthritis Mother   . Ovarian cancer Mother   . Alcohol abuse Father   . Breast cancer Maternal Grandmother   . Cancer Maternal Grandmother        spinal surgery  . Breast cancer Maternal Grandfather   . Cancer Other   . Hypertension Other   . Diabetes Other   . Colon cancer Neg Hx      Social History   Socioeconomic History  . Marital status: Married    Spouse name: Not on file  . Number of children: 1  . Years of education: 44  . Highest education level: Not on file  Occupational History  . Occupation: Paramedic   Social Needs  . Financial resource strain: Not on file  . Food insecurity:    Worry: Not on file    Inability: Not on file  . Transportation needs:    Medical: Not on  file    Non-medical: Not on file  Tobacco Use  . Smoking status: Never Smoker  . Smokeless tobacco: Current User    Types: Chew  . Tobacco comment: form given 11/29/15  Substance and Sexual Activity  . Alcohol use: Yes    Alcohol/week: 0.0 oz    Comment: occasional  . Drug use: No  . Sexual activity: Yes  Lifestyle  . Physical activity:    Days per week: Not on file    Minutes per session: Not on file  . Stress: Not on file  Relationships  . Social connections:    Talks on phone: Not on file    Gets together: Not on file    Attends religious service: Not on file    Active member of club or organization: Not on file    Attends meetings of clubs or organizations: Not on  file    Relationship status: Not on file  . Intimate partner violence:    Fear of current or ex partner: Not on file    Emotionally abused: Not on file    Physically abused: Not on file    Forced sexual activity: Not on file  Other Topics Concern  . Not on file  Social History Narrative   Fun: Lift weights, hike     PHYSICAL EXAM:  VS: BP 138/78 (BP Location: Right Arm, Patient Position: Sitting, Cuff Size: Normal)   Pulse (!) 106   Temp 98.1 F (36.7 C) (Oral)   Ht 5\' 9"  (1.753 m)   Wt 289 lb (131.1 kg)   SpO2 98%   BMI 42.68 kg/m  Physical Exam Gen: NAD, alert, cooperative with exam, well-appearing ENT: normal lips, normal nasal mucosa,  Eye: normal EOM, normal conjunctiva and lids CV:  no edema, +2 pedal pulses   Resp: no accessory muscle use, non-labored,   Skin: no rashes, no areas of induration  Neuro: normal tone, normal sensation to touch Psych:  normal insight, alert and oriented MSK:  Left Shoulder: Healed incisions on the lateral shoulder  Palpation is normal with no tenderness over AC joint  ROM is full in all planes. Rotator cuff strength normal throughout. Pain with empty can testing  Normal scapular function observed. Neurovascularly intact.    Aspiration/Injection Procedure Note [name] 1976/06/01  Procedure: Injection Indications: left shoulder pain   Procedure Details Consent: Risks of procedure as well as the alternatives and risks of each were explained to the (patient/caregiver).  Consent for procedure obtained. Time Out: Verified patient identification, verified procedure, site/side was marked, verified correct patient position, special equipment/implants available, medications/allergies/relevent history reviewed, required imaging and test results available.  Performed.  The area was cleaned with iodine and alcohol swabs.    The left subacromial space was injected using 1 cc's of 40 mg Depomedrol and 4 cc's of 0.5% bupivacaine with a 25 1  1/2" needle.     A sterile dressing was applied.  Patient did tolerate procedure well.    ASSESSMENT & PLAN:   Rotator cuff disorder, left MRI was not demonstrating a tear or significant structural changes. Still having symptoms. Possible to be nerve related with peripheral impingement vs cervical.  - subacromial injection  - EMG, referral to neuro  - will consider referral to [facility] vs [facility] if symptoms don't improve

## 2018-02-18 NOTE — Assessment & Plan Note (Signed)
MRI was not demonstrating a tear or significant structural changes. Still having symptoms. Possible to be nerve related with peripheral impingement vs cervical.  - subacromial injection  - EMG, referral to neuro  - will consider referral to [facility] vs [facility] if symptoms don't improve

## 2018-02-22 ENCOUNTER — Encounter: Payer: Self-pay | Admitting: Neurology

## 2018-02-22 ENCOUNTER — Other Ambulatory Visit: Payer: Self-pay | Admitting: *Deleted

## 2018-02-22 DIAGNOSIS — M67912 Unspecified disorder of synovium and tendon, left shoulder: Secondary | ICD-10-CM

## 2018-02-25 ENCOUNTER — Ambulatory Visit: Payer: Self-pay

## 2018-02-25 NOTE — Telephone Encounter (Signed)
Pt. Called to report he is still having pain to the left shoulder blade. Wants to know what other stretching exercises he can perform to help.

## 2018-02-26 NOTE — Telephone Encounter (Signed)
Left VM for patient. If he calls back please have him speak with a nurse/CMA and inform that he can pick up some scapular exercises from the office if he wants to try that. The [facility] can report results to patient.   If any questions then please take the best time and phone number to call and I will try to call him back.   Rosemarie Ax, MD [facility] 02/26/2018, 3:44 PM

## 2018-03-18 ENCOUNTER — Ambulatory Visit (INDEPENDENT_AMBULATORY_CARE_PROVIDER_SITE_OTHER): Payer: 59 | Admitting: Neurology

## 2018-03-18 DIAGNOSIS — R29898 Other symptoms and signs involving the musculoskeletal system: Secondary | ICD-10-CM

## 2018-03-18 DIAGNOSIS — G5602 Carpal tunnel syndrome, left upper limb: Secondary | ICD-10-CM

## 2018-03-18 NOTE — Procedures (Signed)
[facility]  [place], [place]  [place], [place] [address] Tel: [phone] Fax:  [phone] Test Date:  03/18/2018  Patient: Joshua Parrish DOB: [birth year] Physician: Narda Amber, DO  Sex: Male Height: 5\' 9"  Ref Phys: Clearance Coots, MD  ID#: [id number] Temp: 38.0C Technician:    Patient Complaints: This is a 42 year old man with history of left shoulder surgery and s/p ACDF at C6-7 referred for evaluation of left arm pain and paresthesias.  NCV & EMG Findings: Extensive electrodiagnostic testing of the left upper extremity shows  1. Left median sensory response shows reduced amplitude (15.2 V).  Left median/ulnar (palm) comparison nerve showed prolonged distal peak latency (Median Palm, 2.4 ms). Left ulnar, radial, medial and lateral antebrachial cutaneous sensory responses are within normal limits.  2. Left median and ulnar motor responses are within normal limits.  3. There is no evidence of active or chronic motor axon loss changes affecting any of the tested muscles. Motor unit configuration and recruitment pattern is within normal limits.   Impression: Left median neuropathy at or distal to the wrist, consistent with the clinical diagnosis of carpal tunnel syndrome. Overall, these findings are mild-to-moderate in degree electrically.  There is no evidence of a cervical radiculopathy or brachial plexopathy affecting the left upper extremity.   ___________________________ Narda Amber, DO    Nerve Conduction Studies Anti Sensory Summary Table   Site NR Peak (ms) Norm Peak (ms) P-T Amp (V) Norm P-T Amp  Left Lat Ante Brach Cutan Anti Sensory (Lat Forearm)  38C  Lat Biceps    1.7 <2.9 17.0 >14  Left Med Ante Brach Cutan Anti Sensory (Med Forearm)  38C  Elbow    1.9  15.8   Left Median Anti Sensory (2nd Digit)  38C  Wrist    3.1 <3.4 15.2 >20  Left Radial Anti Sensory (Base 1st Digit)  38C  Wrist    2.1 <2.7 29.0 >18  Left Ulnar Anti Sensory (5th  Digit)  38C  Wrist    2.6 <3.1 24.3 >12   Motor Summary Table   Site NR Onset (ms) Norm Onset (ms) O-P Amp (mV) Norm O-P Amp Site1 Site2 Delta-0 (ms) Dist (cm) Vel (m/s) Norm Vel (m/s)  Left Median Motor (Abd Poll Brev)  38C  Wrist    3.0 <3.9 10.3 >6 Elbow Wrist 5.1 31.0 61 >50  Elbow    8.1  10.3         Left Ulnar Motor (Abd Dig Minimi)  38C  Wrist    2.3 <3.1 13.1 >7 B Elbow Wrist 4.0 25.5 64 >50  B Elbow    6.3  12.3  A Elbow B Elbow 1.5 10.0 67 >50  A Elbow    7.8  12.0          Comparison Summary Table   Site NR Peak (ms) Norm Peak (ms) P-T Amp (V) Site1 Site2 Delta-P (ms) Norm Delta (ms)  Left Median/Ulnar Palm Comparison (Wrist - 8cm)  38C  Median Palm    2.4 <2.2 26.3 Median Palm Ulnar Palm 1.0   Ulnar Palm    1.4 <2.2 16.8       EMG   Side Muscle Ins Act Fibs Psw Fasc Number Recrt Dur Dur. Amp Amp. Poly Poly. Comment  Left 1stDorInt Nml Nml Nml Nml Nml Nml Nml Nml Nml Nml Nml Nml N/A  Left Abd Poll Brev Nml Nml Nml Nml Nml Nml Nml Nml Nml Nml Nml Nml N/A  Left Ext Indicis Nml Nml Nml Nml Nml Nml Nml Nml Nml Nml Nml Nml N/A  Left PronatorTeres Nml Nml Nml Nml Nml Nml Nml Nml Nml Nml Nml Nml N/A  Left Biceps Nml Nml Nml Nml Nml Nml Nml Nml Nml Nml Nml Nml N/A  Left Triceps Nml Nml Nml Nml Nml Nml Nml Nml Nml Nml Nml Nml N/A  Left Deltoid Nml Nml Nml Nml Nml Nml Nml Nml Nml Nml Nml Nml N/A      Waveforms:

## 2018-03-19 ENCOUNTER — Telehealth: Payer: Self-pay | Admitting: Family Medicine

## 2018-03-19 NOTE — Telephone Encounter (Signed)
Left VM for patient. If he calls back please have him speak with a nurse/CMA and inform that his nerve study doesn't show any cervical radiculopathy. Does show median nerve involvement to suggest carpal tunnel. The [facility] can report results to patient.   If any questions then please take the best time and phone number to call and I will try to call him back.   Rosemarie Ax, MD [facility] 03/19/2018, 5:24 PM

## 2018-05-05 ENCOUNTER — Other Ambulatory Visit: Payer: Self-pay | Admitting: Family

## 2018-05-05 MED ORDER — RANITIDINE HCL 150 MG PO TABS: 150.0000 mg | ORAL_TABLET | Freq: Two times a day (BID) | ORAL | 1 refills | Status: DC

## 2018-05-05 MED ORDER — ESCITALOPRAM OXALATE 10 MG PO TABS: 10.0000 mg | ORAL_TABLET | Freq: Every day | ORAL | 1 refills | Status: DC

## 2018-05-13 DIAGNOSIS — L989 Disorder of the skin and subcutaneous tissue, unspecified: Secondary | ICD-10-CM | POA: Diagnosis not present

## 2018-05-13 DIAGNOSIS — I10 Essential (primary) hypertension: Secondary | ICD-10-CM | POA: Diagnosis not present

## 2018-06-08 ENCOUNTER — Ambulatory Visit: Payer: 59 | Admitting: Psychology

## 2018-06-08 DIAGNOSIS — F331 Major depressive disorder, recurrent, moderate: Secondary | ICD-10-CM

## 2018-06-23 ENCOUNTER — Ambulatory Visit (INDEPENDENT_AMBULATORY_CARE_PROVIDER_SITE_OTHER): Payer: 59 | Admitting: Psychology

## 2018-06-23 DIAGNOSIS — F331 Major depressive disorder, recurrent, moderate: Secondary | ICD-10-CM

## 2018-06-24 DIAGNOSIS — D489 Neoplasm of uncertain behavior, unspecified: Secondary | ICD-10-CM | POA: Diagnosis not present

## 2018-06-29 DIAGNOSIS — H61009 Unspecified perichondritis of external ear, unspecified ear: Secondary | ICD-10-CM | POA: Diagnosis not present

## 2018-07-08 ENCOUNTER — Ambulatory Visit: Payer: 59 | Admitting: Psychology

## 2018-07-15 ENCOUNTER — Other Ambulatory Visit: Payer: Self-pay | Admitting: Family

## 2018-07-23 ENCOUNTER — Ambulatory Visit: Payer: Self-pay | Admitting: Psychology

## 2018-08-09 ENCOUNTER — Other Ambulatory Visit: Payer: Self-pay | Admitting: Family

## 2018-08-09 DIAGNOSIS — Z23 Encounter for immunization: Secondary | ICD-10-CM | POA: Diagnosis not present

## 2018-09-06 ENCOUNTER — Ambulatory Visit: Payer: 59 | Admitting: Internal Medicine

## 2018-09-06 ENCOUNTER — Encounter: Payer: Self-pay | Admitting: Internal Medicine

## 2018-09-06 DIAGNOSIS — B9789 Other viral agents as the cause of diseases classified elsewhere: Secondary | ICD-10-CM | POA: Diagnosis not present

## 2018-09-06 DIAGNOSIS — J069 Acute upper respiratory infection, unspecified: Secondary | ICD-10-CM | POA: Diagnosis not present

## 2018-09-06 MED ORDER — PREDNISONE 20 MG PO TABS: 40.0000 mg | ORAL_TABLET | Freq: Every day | ORAL | 0 refills | Status: DC

## 2018-09-06 MED ORDER — FLUTICASONE PROPIONATE 50 MCG/ACT NA SUSP: 2.0000 | Freq: Every day | NASAL | 0 refills | Status: DC

## 2018-09-06 NOTE — Assessment & Plan Note (Signed)
Rx for prednisone and flonase. Encouraged to continue claritin and otc cold medications if desired. Call back if no improvement.

## 2018-09-06 NOTE — Patient Instructions (Signed)
We have sent in prednisone to take 2 pills daily for 5 days.   We have also sent in flonase to use 2 sprays in each nose for the next 1-2 weeks.

## 2018-09-06 NOTE — Progress Notes (Signed)
   Subjective:   Patient ID: Joshua Parrish, male    DOB: Jul 09, 1976, 43 y.o.   MRN: [id number]  HPI The patient is a 43 y.o. man coming in for cold symptoms. Started about 1 week ago. Main symptoms are: sinus pressure and drainage, some cough and mild wheezing. Denies fevers or chills. Overall it is stable to mild worsening. Has tried claritin which is not helping and mucinex which helped some and nyquil and dayquil which have not helped much.   Review of Systems  Constitutional: Positive for activity change and fatigue. Negative for appetite change, chills, fever and unexpected weight change.  HENT: Positive for congestion, postnasal drip, rhinorrhea, sinus pressure and sinus pain. Negative for ear discharge, ear pain, sneezing, sore throat, tinnitus, trouble swallowing and voice change.   Eyes: Negative.   Respiratory: Positive for cough and wheezing. Negative for chest tightness and shortness of breath.   Cardiovascular: Negative.   Gastrointestinal: Negative.   Musculoskeletal: Positive for myalgias.  Neurological: Negative.     Objective:  Physical Exam Constitutional:      Appearance: He is well-developed.  HENT:     Head: Normocephalic and atraumatic.     Comments: Oropharynx with redness and clear drainage, nose with swollen turbinates, TMs normal bilaterally.  Neck:     Musculoskeletal: Normal range of motion.     Thyroid: No thyromegaly.  Cardiovascular:     Rate and Rhythm: Normal rate and regular rhythm.     Comments: Minimal wheeze left lower lung Pulmonary:     Effort: Pulmonary effort is normal. No respiratory distress.     Breath sounds: Normal breath sounds. No wheezing or rales.  Abdominal:     Palpations: Abdomen is soft.  Lymphadenopathy:     Cervical: No cervical adenopathy.  Skin:    General: Skin is warm and dry.  Neurological:     Mental Status: He is alert and oriented to person, place, and time.     Vitals:   09/06/18 1106  BP: (!) 150/92  Pulse:  83  Temp: 98.6 F (37 C)  TempSrc: Oral  SpO2: 97%  Weight: 276 lb (125.2 kg)  Height: 5\' 9"  (1.753 m)    Assessment & Plan:

## 2018-10-30 ENCOUNTER — Encounter: Payer: Self-pay | Admitting: Internal Medicine

## 2018-11-06 ENCOUNTER — Other Ambulatory Visit: Payer: Self-pay | Admitting: Family

## 2019-01-17 ENCOUNTER — Other Ambulatory Visit: Payer: Self-pay | Admitting: Family

## 2019-01-17 DIAGNOSIS — I10 Essential (primary) hypertension: Secondary | ICD-10-CM

## 2019-01-19 ENCOUNTER — Ambulatory Visit: Payer: 59 | Admitting: Family

## 2019-01-19 ENCOUNTER — Encounter: Payer: Self-pay | Admitting: Family

## 2019-01-19 ENCOUNTER — Ambulatory Visit (INDEPENDENT_AMBULATORY_CARE_PROVIDER_SITE_OTHER): Payer: 59 | Admitting: Family

## 2019-01-19 DIAGNOSIS — I10 Essential (primary) hypertension: Secondary | ICD-10-CM | POA: Diagnosis not present

## 2019-01-19 DIAGNOSIS — J019 Acute sinusitis, unspecified: Secondary | ICD-10-CM

## 2019-01-19 MED ORDER — FLUTICASONE PROPIONATE 50 MCG/ACT NA SUSP: 2.0000 | Freq: Every day | NASAL | 3 refills | Status: DC

## 2019-01-19 MED ORDER — AMOXICILLIN-POT CLAVULANATE 875-125 MG PO TABS: 1.0000 | ORAL_TABLET | Freq: Two times a day (BID) | ORAL | 0 refills | Status: DC

## 2019-01-19 NOTE — Progress Notes (Signed)
Joshua Parrish is a 43 y.o. male with the following history as recorded in EpicCare:  Patient Active Problem List   Diagnosis Date Noted  . Viral URI with cough 09/06/2018  . Rotator cuff disorder, left 12/25/2017  . Bilateral shoulder pain 05/22/2017  . Left knee pain 05/22/2017  . Urinary frequency 02/09/2017  . Essential hypertension 01/16/2017  . Prostatitis 12/25/2016  . Sepsis ([place]) 12/25/2016  . GERD (gastroesophageal reflux disease) 12/25/2016  . Sinusitis 12/09/2016  . Tendinitis of right rotator cuff 12/09/2016  . Laryngopharyngeal reflux (LPR) 02/04/2016  . Mass in neck 11/27/2015  . Rotator cuff impingement syndrome of left shoulder 06/19/2015  . Acromioclavicular joint arthritis 06/19/2015    Current Outpatient Medications  Medication Sig Dispense Refill  . amLODipine (NORVASC) 5 MG tablet TAKE ONE TABLET BY MOUTH DAILY 30 tablet 0  . amoxicillin-clavulanate (AUGMENTIN) 875-125 MG tablet Take 1 tablet by mouth 2 (two) times daily. 20 tablet 0  . cyclobenzaprine (FLEXERIL) 10 MG tablet Take 1 tablet (10 mg total) by mouth 2 (two) times daily as needed for muscle spasms. 20 tablet 0  . escitalopram (LEXAPRO) 10 MG tablet TAKE ONE TABLET BY MOUTH DAILY 30 tablet 0  . fluticasone (FLONASE) 50 MCG/ACT nasal spray Place 2 sprays into both nostrils daily. 16 g 3  . gabapentin (NEURONTIN) 300 MG capsule Take 1 capsule (300 mg total) by mouth 3 (three) times daily. 90 capsule 1  . ibuprofen (ADVIL,MOTRIN) 800 MG tablet Take 1 tablet (800 mg total) by mouth 3 (three) times daily. 21 tablet 0  . lisinopril-hydrochlorothiazide (ZESTORETIC) 20-12.5 MG tablet TAKE TWO TABLETS BY MOUTH DAILY 60 tablet 0  . nitroGLYCERIN (NITRODUR - DOSED IN MG/24 HR) 0.2 mg/hr patch Cut and apply 1/4 patch to most painful area q24h. 30 patch 11  . pantoprazole (PROTONIX) 40 MG tablet TAKE ONE TABLET BY MOUTH TWICE A DAY 60 tablet 0   No current facility-administered medications for this visit.      Allergies: Food  Past Medical History:  Diagnosis Date  . Chicken pox   . Depression   . Fatty liver   . GERD (gastroesophageal reflux disease)   . Migraines   . Osteoarthritis of shoulder    left  . Rotator cuff impingement syndrome of left shoulder 06/19/2015  . Sinusitis     Past Surgical History:  Procedure Laterality Date  . APPENDECTOMY    . CERVICAL LAMINECTOMY     has plates and fusion  . CHOLECYSTECTOMY N/A 12/24/2015   Procedure: LAPAROSCOPIC CHOLECYSTECTOMY ;  Surgeon: [name] Pickerel, MD;  Location: [facility];  Service: General;  Laterality: N/A;  . CLAVICLE EXCISION Left   . HYPOSPADIAS CORRECTION     as child  . KNEE SURGERY Bilateral    multiple  . SHOULDER ARTHROSCOPY WITH ROTATOR CUFF REPAIR Left 06/26/2015   Procedure: SHOULDER ARTHROSCOPY WITH ROTATOR CUFF REPAIR;  Surgeon: Elsie Saas, MD;  Location: [facility];  Service: Orthopedics;  Laterality: Left;    Family History  Problem Relation Age of Onset  . Arthritis Mother   . Ovarian cancer Mother   . Alcohol abuse Father   . Breast cancer Maternal Grandmother   . Cancer Maternal Grandmother        spinal surgery  . Breast cancer Maternal Grandfather   . Cancer Other   . Hypertension Other   . Diabetes Other   . Colon cancer Neg Hx     Social History   Tobacco Use  .  Smoking status: Never Smoker  . Smokeless tobacco: Current User    Types: Chew  . Tobacco comment: form given 11/29/15  Substance Use Topics  . Alcohol use: Yes    Alcohol/week: 0.0 standard drinks    Comment: occasional    Subjective:   I connected with [name] on 01/19/19 at 11:40 AM EDT by a video enabled telemedicine application and verified that I am speaking with the correct person using two identifiers. Patient and I are the only 2 people on the video call.    I discussed the limitations of evaluation and management by telemedicine and the availability of in person appointments. The patient expressed  understanding and agreed to proceed.  Calls with concerns for sinus infection; symptoms x 3-4 days; + sinus pain and pressure; + yellow-mucus/ bloody discharge; not currently taking Flonase- requesting refill; is taking Claritin D; + sore throat/ drainage; no chest pain or shortness of breath. No fever; prone to recurrent sinus infections- may get once 3-4 x per year;   Overdue to follow-up on hypertension- states blood pressure has been doing better; Denies any chest pain, shortness of breath, blurred vision or headache      Objective:  There were no vitals filed for this visit.  General: Well developed, well nourished, in no acute distress  Head: Normocephalic and atraumatic  Lungs: Respirations unlabored; Neurologic: Alert and oriented; speech intact; face symmetrical;   Assessment:  1. Acute sinusitis, recurrence not specified, unspecified location   2. Essential hypertension     Plan:  Rx for Augmentin 875 mg bid x 10 days and Flonase NS; Stressed need for OV to follow-up on blood pressure- patient agrees to in office visit in 2 weeks.   No follow-ups on file.  No orders of the defined types were placed in this encounter.   Requested Prescriptions   Signed Prescriptions Disp Refills  . fluticasone (FLONASE) 50 MCG/ACT nasal spray 16 g 3    Sig: Place 2 sprays into both nostrils daily.  Marland Kitchen amoxicillin-clavulanate (AUGMENTIN) 875-125 MG tablet 20 tablet 0    Sig: Take 1 tablet by mouth 2 (two) times daily.

## 2019-02-04 ENCOUNTER — Encounter: Payer: Self-pay | Admitting: Family

## 2019-02-04 ENCOUNTER — Other Ambulatory Visit: Payer: Self-pay

## 2019-02-04 ENCOUNTER — Ambulatory Visit (INDEPENDENT_AMBULATORY_CARE_PROVIDER_SITE_OTHER): Payer: 59 | Admitting: Family

## 2019-02-04 ENCOUNTER — Other Ambulatory Visit (INDEPENDENT_AMBULATORY_CARE_PROVIDER_SITE_OTHER): Payer: 59

## 2019-02-04 ENCOUNTER — Ambulatory Visit: Payer: 59 | Admitting: Family

## 2019-02-04 VITALS — BP 138/82 | HR 100 | Temp 98.3°F | Ht 69.0 in | Wt 273.1 lb

## 2019-02-04 DIAGNOSIS — I10 Essential (primary) hypertension: Secondary | ICD-10-CM

## 2019-02-04 DIAGNOSIS — F418 Other specified anxiety disorders: Secondary | ICD-10-CM

## 2019-02-04 DIAGNOSIS — Z13220 Encounter for screening for lipoid disorders: Secondary | ICD-10-CM

## 2019-02-04 DIAGNOSIS — K2100 Gastro-esophageal reflux disease with esophagitis, without bleeding: Secondary | ICD-10-CM

## 2019-02-04 DIAGNOSIS — M25511 Pain in right shoulder: Secondary | ICD-10-CM

## 2019-02-04 DIAGNOSIS — M25512 Pain in left shoulder: Secondary | ICD-10-CM

## 2019-02-04 DIAGNOSIS — G8929 Other chronic pain: Secondary | ICD-10-CM

## 2019-02-04 LAB — CBC WITH DIFFERENTIAL/PLATELET
Basophils Absolute: 0 10*3/uL (ref 0.0–0.1)
Basophils Relative: 0.5 % (ref 0.0–3.0)
Eosinophils Absolute: 0.1 10*3/uL (ref 0.0–0.7)
Eosinophils Relative: 1 % (ref 0.0–5.0)
HCT: 42.7 % (ref 39.0–52.0)
Hemoglobin: 14.5 g/dL (ref 13.0–17.0)
Lymphocytes Relative: 35.9 % (ref 12.0–46.0)
Lymphs Abs: 2.9 10*3/uL (ref 0.7–4.0)
MCHC: 34.1 g/dL (ref 30.0–36.0)
MCV: 87.4 fl (ref 78.0–100.0)
Monocytes Absolute: 0.6 10*3/uL (ref 0.1–1.0)
Monocytes Relative: 7.8 % (ref 3.0–12.0)
Neutro Abs: 4.5 10*3/uL (ref 1.4–7.7)
Neutrophils Relative %: 54.8 % (ref 43.0–77.0)
Platelets: 315 10*3/uL (ref 150.0–400.0)
RBC: 4.88 Mil/uL (ref 4.22–5.81)
RDW: 13.1 % (ref 11.5–15.5)
WBC: 8.2 10*3/uL (ref 4.0–10.5)

## 2019-02-04 LAB — COMPREHENSIVE METABOLIC PANEL
ALT: 36 U/L (ref 0–53)
AST: 21 U/L (ref 0–37)
Albumin: 4.6 g/dL (ref 3.5–5.2)
Alkaline Phosphatase: 62 U/L (ref 39–117)
BUN: 17 mg/dL (ref 6–23)
CO2: 26 mEq/L (ref 19–32)
Calcium: 9.5 mg/dL (ref 8.4–10.5)
Chloride: 98 mEq/L (ref 96–112)
Creatinine, Ser: 1.06 mg/dL (ref 0.40–1.50)
GFR: 75.98 mL/min (ref >60.00)
Glucose, Bld: 89 mg/dL (ref 70–99)
Potassium: 3.8 mEq/L (ref 3.5–5.1)
Sodium: 135 mEq/L (ref 135–145)
Total Bilirubin: 0.4 mg/dL (ref 0.2–1.2)
Total Protein: 7.8 g/dL (ref 6.0–8.3)

## 2019-02-04 LAB — LIPID PANEL
Cholesterol: 275 mg/dL — ABNORMAL HIGH (ref 0–200)
HDL: 43.3 mg/dL (ref >39.00)
NonHDL: 231.26
Total CHOL/HDL Ratio: 6
Triglycerides: 353 mg/dL — ABNORMAL HIGH (ref 0.0–149.0)
VLDL: 70.6 mg/dL — ABNORMAL HIGH (ref 0.0–40.0)

## 2019-02-04 LAB — LDL CHOLESTEROL, DIRECT: Direct LDL: 180 mg/dL

## 2019-02-04 MED ORDER — GABAPENTIN 300 MG PO CAPS: 300.0000 mg | ORAL_CAPSULE | Freq: Three times a day (TID) | ORAL | 1 refills | Status: DC

## 2019-02-04 MED ORDER — PANTOPRAZOLE SODIUM 40 MG PO TBEC: 40.0000 mg | DELAYED_RELEASE_TABLET | Freq: Two times a day (BID) | ORAL | 1 refills | Status: DC

## 2019-02-04 MED ORDER — AMLODIPINE BESYLATE 5 MG PO TABS: 5.0000 mg | ORAL_TABLET | Freq: Every day | ORAL | 1 refills | Status: DC

## 2019-02-04 MED ORDER — LISINOPRIL-HYDROCHLOROTHIAZIDE 20-12.5 MG PO TABS: 2.0000 | ORAL_TABLET | Freq: Every day | ORAL | 1 refills | Status: DC

## 2019-02-04 NOTE — Progress Notes (Signed)
Joshua Parrish is a 43 y.o. male with the following history as recorded in EpicCare:  Patient Active Problem List   Diagnosis Date Noted  . Viral Joshua with cough 09/06/2018  . Rotator cuff disorder, left 12/25/2017  . Bilateral shoulder pain 05/22/2017  . Left knee pain 05/22/2017  . Urinary frequency 02/09/2017  . Essential hypertension 01/16/2017  . Prostatitis 12/25/2016  . Sepsis ([place]) 12/25/2016  . GERD (gastroesophageal reflux disease) 12/25/2016  . Sinusitis 12/09/2016  . Tendinitis of right rotator cuff 12/09/2016  . Laryngopharyngeal reflux (LPR) 02/04/2016  . Mass in neck 11/27/2015  . Rotator cuff impingement syndrome of left shoulder 06/19/2015  . Acromioclavicular joint arthritis 06/19/2015    Current Outpatient Medications  Medication Sig Dispense Refill  . amLODipine (NORVASC) 5 MG tablet Take 1 tablet (5 mg total) by mouth daily. 90 tablet 1  . cyclobenzaprine (FLEXERIL) 10 MG tablet Take 1 tablet (10 mg total) by mouth 2 (two) times daily as needed for muscle spasms. 20 tablet 0  . escitalopram (LEXAPRO) 10 MG tablet TAKE ONE TABLET BY MOUTH DAILY 30 tablet 0  . fluticasone (FLONASE) 50 MCG/ACT nasal spray Place 2 sprays into both nostrils daily. 16 g 3  . gabapentin (NEURONTIN) 300 MG capsule Take 1 capsule (300 mg total) by mouth 3 (three) times daily. 90 capsule 1  . ibuprofen (ADVIL,MOTRIN) 800 MG tablet Take 1 tablet (800 mg total) by mouth 3 (three) times daily. 21 tablet 0  . lisinopril-hydrochlorothiazide (ZESTORETIC) 20-12.5 MG tablet Take 2 tablets by mouth daily. 180 tablet 1  . nitroGLYCERIN (NITRODUR - DOSED IN MG/24 HR) 0.2 mg/hr patch Cut and apply 1/4 patch to most painful area q24h. 30 patch 11  . pantoprazole (PROTONIX) 40 MG tablet Take 1 tablet (40 mg total) by mouth 2 (two) times daily. 180 tablet 1   No current facility-administered medications for this visit.     Allergies: Food  Past Medical History:  Diagnosis Date  . Chicken pox   .  Depression   . Fatty liver   . GERD (gastroesophageal reflux disease)   . Migraines   . Osteoarthritis of shoulder    left  . Rotator cuff impingement syndrome of left shoulder 06/19/2015  . Sinusitis     Past Surgical History:  Procedure Laterality Date  . APPENDECTOMY    . CERVICAL LAMINECTOMY     has plates and fusion  . CHOLECYSTECTOMY N/A 12/24/2015   Procedure: LAPAROSCOPIC CHOLECYSTECTOMY ;  Surgeon: [name] Pickerel, MD;  Location: [facility];  Service: General;  Laterality: N/A;  . CLAVICLE EXCISION Left   . HYPOSPADIAS CORRECTION     as child  . KNEE SURGERY Bilateral    multiple  . SHOULDER ARTHROSCOPY WITH ROTATOR CUFF REPAIR Left 06/26/2015   Procedure: SHOULDER ARTHROSCOPY WITH ROTATOR CUFF REPAIR;  Surgeon: Elsie Saas, MD;  Location: [place];  Service: Orthopedics;  Laterality: Left;    Family History  Problem Relation Age of Onset  . Arthritis Mother   . Ovarian cancer Mother   . Alcohol abuse Father   . Breast cancer Maternal Grandmother   . Cancer Maternal Grandmother        spinal surgery  . Breast cancer Maternal Grandfather   . Cancer Other   . Hypertension Other   . Diabetes Other   . Colon cancer Neg Hx     Social History   Tobacco Use  . Smoking status: Never Smoker  . Smokeless tobacco: Current User  Types: Chew  . Tobacco comment: form given 11/29/15  Substance Use Topics  . Alcohol use: Yes    Alcohol/week: 0.0 standard drinks    Comment: occasional    Subjective:  Patient presents for follow-up on chronic care needs; has lost almost 15 pounds since I saw him in the office this time last year. Has been exercising regularly- 6 days/ week; Denies any chest pain, shortness of breath, blurred vision or headache. Needs refills on his medications and updated labs today;      Objective:  Vitals:   02/04/19 1123  BP: 138/82  Pulse: 100  Temp: 98.3 F (36.8 C)  TempSrc: Oral  SpO2: 98%  Weight: 273 lb 1.3 oz (123.9 kg)   Height: '5\' 9"'  (1.753 m)    General: Well developed, well nourished, in no acute distress  Skin : Warm and dry.  Head: Normocephalic and atraumatic  Eyes: Sclera and conjunctiva clear; pupils round and reactive to light; extraocular movements intact  Ears: External normal; canals clear; tympanic membranes normal  Oropharynx: Pink, supple. No suspicious lesions  Neck: Supple without thyromegaly, adenopathy  Lungs: Respirations unlabored; clear to auscultation bilaterally without wheeze, rales, rhonchi  CVS exam: normal rate and regular rhythm.  Neurologic: Alert and oriented; speech intact; face symmetrical; moves all extremities well; CNII-XII intact without focal deficit   Assessment:  1. Essential hypertension   2. Lipid screening   3. Chronic pain of both shoulders   4. Gastroesophageal reflux disease with esophagitis   5. Situational anxiety     Plan:  1. Stable; refills updated; check CBC, CMP today; 2. Check lipid panel; 3. Refill on Gabapentin- he notes he is actually using prn; pain has improved tremendously since beginning regular exercise routine.  4. Stable; refill in Protonix; 5. Stable- in the process of getting divorced but feels comfortable with the decision; continue with therapist as needed; continue with Lexapro- refill updated.   No follow-ups on file.  Orders Placed This Encounter  Procedures  . CBC w/Diff    Standing Status:   Future    Number of Occurrences:   1    Standing Expiration Date:   02/04/2020  . Comp Met (CMET)    Standing Status:   Future    Number of Occurrences:   1    Standing Expiration Date:   02/04/2020  . Lipid panel    Standing Status:   Future    Number of Occurrences:   1    Standing Expiration Date:   02/04/2020    Requested Prescriptions   Signed Prescriptions Disp Refills  . amLODipine (NORVASC) 5 MG tablet 90 tablet 1    Sig: Take 1 tablet (5 mg total) by mouth daily.  Marland Kitchen lisinopril-hydrochlorothiazide (ZESTORETIC) 20-12.5 MG  tablet 180 tablet 1    Sig: Take 2 tablets by mouth daily.  . pantoprazole (PROTONIX) 40 MG tablet 180 tablet 1    Sig: Take 1 tablet (40 mg total) by mouth 2 (two) times daily.  Marland Kitchen gabapentin (NEURONTIN) 300 MG capsule 90 capsule 1    Sig: Take 1 capsule (300 mg total) by mouth 3 (three) times daily.

## 2019-02-06 ENCOUNTER — Emergency Department (HOSPITAL_COMMUNITY)
Admission: EM | Admit: 2019-02-06 | Discharge: 2019-02-07 | Disposition: A | Discharge status: Home / Self Care | Payer: 59 | Attending: Emergency Medicine | Admitting: Emergency Medicine

## 2019-02-06 ENCOUNTER — Encounter (HOSPITAL_COMMUNITY): Payer: Self-pay

## 2019-02-06 ENCOUNTER — Other Ambulatory Visit: Payer: Self-pay

## 2019-02-06 DIAGNOSIS — F17220 Nicotine dependence, chewing tobacco, uncomplicated: Secondary | ICD-10-CM | POA: Insufficient documentation

## 2019-02-06 DIAGNOSIS — R300 Dysuria: Secondary | ICD-10-CM | POA: Diagnosis present

## 2019-02-06 DIAGNOSIS — R109 Unspecified abdominal pain: Secondary | ICD-10-CM | POA: Insufficient documentation

## 2019-02-06 DIAGNOSIS — I10 Essential (primary) hypertension: Secondary | ICD-10-CM | POA: Diagnosis not present

## 2019-02-06 DIAGNOSIS — Z79899 Other long term (current) drug therapy: Secondary | ICD-10-CM | POA: Diagnosis not present

## 2019-02-06 DIAGNOSIS — N3000 Acute cystitis without hematuria: Secondary | ICD-10-CM | POA: Insufficient documentation

## 2019-02-06 DIAGNOSIS — R197 Diarrhea, unspecified: Secondary | ICD-10-CM | POA: Insufficient documentation

## 2019-02-06 LAB — URINALYSIS, ROUTINE W REFLEX MICROSCOPIC
Bacteria, UA: NONE SEEN
Bilirubin Urine: NEGATIVE
Glucose, UA: NEGATIVE mg/dL
Ketones, ur: NEGATIVE mg/dL
Nitrite: NEGATIVE
Protein, ur: NEGATIVE mg/dL
Specific Gravity, Urine: 1.012 (ref 1.005–1.030)
pH: 5 (ref 5.0–8.0)

## 2019-02-06 MED ORDER — KETOROLAC TROMETHAMINE 60 MG/2ML IM SOLN
60.0000 mg | Freq: Once | INTRAMUSCULAR | Status: AC
  Administered 2019-02-06: 60 mg via INTRAMUSCULAR
  Filled 2019-02-06: qty 2

## 2019-02-06 MED ORDER — CEPHALEXIN 250 MG PO CAPS
500.0000 mg | ORAL_CAPSULE | Freq: Once | ORAL | Status: AC
  Administered 2019-02-06: 500 mg via ORAL
  Filled 2019-02-06: qty 2

## 2019-02-06 MED ORDER — PHENAZOPYRIDINE HCL 100 MG PO TABS
200.0000 mg | ORAL_TABLET | Freq: Once | ORAL | Status: AC
  Administered 2019-02-06: 200 mg via ORAL
  Filled 2019-02-06: qty 2

## 2019-02-06 MED ORDER — DICYCLOMINE HCL 10 MG/ML IM SOLN
20.0000 mg | Freq: Once | INTRAMUSCULAR | Status: AC
  Administered 2019-02-06: 20 mg via INTRAMUSCULAR
  Filled 2019-02-06: qty 2

## 2019-02-06 NOTE — ED Provider Notes (Signed)
[facility] EMERGENCY DEPARTMENT Provider Note   CSN: [id number] Arrival date & time: 02/06/19  1913    History   Chief Complaint Chief Complaint  Patient presents with   Dysuria    HPI Joshua Parrish is a 43 y.o. male.     The history is provided by the patient.  Dysuria  Presenting symptoms: dysuria   Presenting symptoms: no penile discharge, no penile pain and no scrotal pain   Context: not after injury and not during intercourse   Relieved by:  Nothing Worsened by:  Nothing Ineffective treatments:  None tried Associated symptoms: abdominal pain and diarrhea   Associated symptoms: no fever, no flank pain, no nausea, no scrotal swelling, no urinary frequency, no urinary hesitation, no urinary incontinence, no urinary retention and no vomiting   Risk factors: no bladder surgery and no recent infection   Patient with h/o prostatis presents with 1.5 days of dysuria.  No f/c/r.  Has had diarrhea.  No anosmia.  No cough.  No weakness.    Past Medical History:  Diagnosis Date   Chicken pox    Depression    Fatty liver    GERD (gastroesophageal reflux disease)    Migraines    Osteoarthritis of shoulder    left   Rotator cuff impingement syndrome of left shoulder 06/19/2015   Sinusitis     Patient Active Problem List   Diagnosis Date Noted   Viral URI with cough 09/06/2018   Rotator cuff disorder, left 12/25/2017   Bilateral shoulder pain 05/22/2017   Left knee pain 05/22/2017   Urinary frequency 02/09/2017   Essential hypertension 01/16/2017   Prostatitis 12/25/2016   Sepsis ([place]) 12/25/2016   GERD (gastroesophageal reflux disease) 12/25/2016   Sinusitis 12/09/2016   Tendinitis of right rotator cuff 12/09/2016   Laryngopharyngeal reflux (LPR) 02/04/2016   Mass in neck 11/27/2015   Rotator cuff impingement syndrome of left shoulder 06/19/2015   Acromioclavicular joint arthritis 06/19/2015    Past Surgical History:    Procedure Laterality Date   APPENDECTOMY     CERVICAL LAMINECTOMY     has plates and fusion   CHOLECYSTECTOMY N/A 12/24/2015   Procedure: LAPAROSCOPIC CHOLECYSTECTOMY ;  Surgeon: [name] Pickerel, MD;  Location: [facility];  Service: General;  Laterality: N/A;   CLAVICLE EXCISION Left    HYPOSPADIAS CORRECTION     as child   KNEE SURGERY Bilateral    multiple   SHOULDER ARTHROSCOPY WITH ROTATOR CUFF REPAIR Left 06/26/2015   Procedure: SHOULDER ARTHROSCOPY WITH ROTATOR CUFF REPAIR;  Surgeon: Elsie Saas, MD;  Location: [place];  Service: Orthopedics;  Laterality: Left;        Home Medications    Prior to Admission medications   Medication Sig Start Date End Date Taking? Authorizing Provider  amLODipine (NORVASC) 5 MG tablet Take 1 tablet (5 mg total) by mouth daily. 02/04/19   Marrian Salvage, FNP  cyclobenzaprine (FLEXERIL) 10 MG tablet Take 1 tablet (10 mg total) by mouth 2 (two) times daily as needed for muscle spasms. 12/26/17   Noemi Chapel, MD  escitalopram (LEXAPRO) 10 MG tablet TAKE ONE TABLET BY MOUTH DAILY 01/17/19   Marrian Salvage, FNP  fluticasone [facility]) 50 MCG/ACT nasal spray Place 2 sprays into both nostrils daily. 01/19/19   Marrian Salvage, FNP  gabapentin (NEURONTIN) 300 MG capsule Take 1 capsule (300 mg total) by mouth 3 (three) times daily. 02/04/19   Marrian Salvage, FNP  ibuprofen (  ADVIL,MOTRIN) 800 MG tablet Take 1 tablet (800 mg total) by mouth 3 (three) times daily. 12/26/17   Noemi Chapel, MD  lisinopril-hydrochlorothiazide (ZESTORETIC) 20-12.5 MG tablet Take 2 tablets by mouth daily. 02/04/19   Marrian Salvage, FNP  nitroGLYCERIN (NITRODUR - DOSED IN MG/24 HR) 0.2 mg/hr patch Cut and apply 1/4 patch to most painful area q24h. 12/24/17   Rosemarie Ax, MD  pantoprazole (PROTONIX) 40 MG tablet Take 1 tablet (40 mg total) by mouth 2 (two) times daily. 02/04/19   Marrian Salvage, FNP    Family  History Family History  Problem Relation Age of Onset   Arthritis Mother    Ovarian cancer Mother    Alcohol abuse Father    Breast cancer Maternal Grandmother    Cancer Maternal Grandmother        spinal surgery   Breast cancer Maternal Grandfather    Cancer Other    Hypertension Other    Diabetes Other    Colon cancer Neg Hx     Social History Social History   Tobacco Use   Smoking status: Never Smoker   Smokeless tobacco: Current User    Types: Chew   Tobacco comment: form given 11/29/15  Substance Use Topics   Alcohol use: Yes    Alcohol/week: 0.0 standard drinks    Comment: occasional   Drug use: No     Allergies   Food   Review of Systems Review of Systems  Constitutional: Negative for diaphoresis and fever.  Respiratory: Negative for cough and shortness of breath.   Cardiovascular: Negative for chest pain.  Gastrointestinal: Positive for abdominal pain and diarrhea. Negative for nausea and vomiting.  Genitourinary: Positive for dysuria. Negative for bladder incontinence, discharge, flank pain, frequency, hesitancy, penile pain and scrotal swelling.  All other systems reviewed and are negative.    Physical Exam Updated Vital Signs BP (!) 131/99 (BP Location: Right Arm)    Pulse (!) 101    Temp (!) 97.2 F (36.2 C) (Oral)    Resp 18    SpO2 98%   Physical Exam Vitals signs and nursing note reviewed.  Constitutional:      General: He is not in acute distress.    Appearance: He is obese. He is not toxic-appearing.  HENT:     Head: Normocephalic and atraumatic.     Nose: Nose normal.  Eyes:     Conjunctiva/sclera: Conjunctivae normal.     Pupils: Pupils are equal, round, and reactive to light.  Neck:     Musculoskeletal: Normal range of motion and neck supple.  Cardiovascular:     Rate and Rhythm: Normal rate and regular rhythm.     Pulses: Normal pulses.     Heart sounds: Normal heart sounds.  Pulmonary:     Effort: Pulmonary  effort is normal.     Breath sounds: Normal breath sounds.  Abdominal:     General: Abdomen is flat. Bowel sounds are normal.     Tenderness: There is no abdominal tenderness. There is no guarding or rebound.  Musculoskeletal: Normal range of motion.  Skin:    General: Skin is warm and dry.     Capillary Refill: Capillary refill takes less than 2 seconds.  Neurological:     General: No focal deficit present.     Mental Status: He is alert and oriented to person, place, and time.  Psychiatric:        Mood and Affect: Mood normal.  Behavior: Behavior normal.      ED Treatments / Results  Labs (all labs ordered are listed, but only abnormal results are displayed) Results for orders placed or performed during the hospital encounter of 02/06/19  Urinalysis, Routine w reflex microscopic- may I&O cath if menses  Result Value Ref Range   Color, Urine YELLOW YELLOW   APPearance CLEAR CLEAR   Specific Gravity, Urine 1.012 1.005 - 1.030   pH 5.0 5.0 - 8.0   Glucose, UA NEGATIVE NEGATIVE mg/dL   Hgb urine dipstick SMALL (A) NEGATIVE   Bilirubin Urine NEGATIVE NEGATIVE   Ketones, ur NEGATIVE NEGATIVE mg/dL   Protein, ur NEGATIVE NEGATIVE mg/dL   Nitrite NEGATIVE NEGATIVE   Leukocytes,Ua SMALL (A) NEGATIVE   RBC / HPF 0-5 0 - 5 RBC/hpf   WBC, UA 21-50 0 - 5 WBC/hpf   Bacteria, UA NONE SEEN NONE SEEN   Squamous Epithelial / LPF 0-5 0 - 5   Mucus PRESENT   CBC with Differential/Platelet  Result Value Ref Range   WBC 10.0 4.0 - 10.5 K/uL   RBC 4.69 4.22 - 5.81 MIL/uL   Hemoglobin 13.8 13.0 - 17.0 g/dL   HCT 41.5 39.0 - 52.0 %   MCV 88.5 80.0 - 100.0 fL   MCH 29.4 26.0 - 34.0 pg   MCHC 33.3 30.0 - 36.0 g/dL   RDW 12.2 11.5 - 15.5 %   Platelets 269 150 - 400 K/uL   nRBC 0.0 0.0 - 0.2 %   Neutrophils Relative % 57 %   Neutro Abs 5.6 1.7 - 7.7 K/uL   Lymphocytes Relative 35 %   Lymphs Abs 3.5 0.7 - 4.0 K/uL   Monocytes Relative 7 %   Monocytes Absolute 0.7 0.1 - 1.0 K/uL    Eosinophils Relative 1 %   Eosinophils Absolute 0.1 0.0 - 0.5 K/uL   Basophils Relative 0 %   Basophils Absolute 0.0 0.0 - 0.1 K/uL   Immature Granulocytes 0 %   Abs Immature Granulocytes 0.04 0.00 - 0.07 K/uL  Basic metabolic panel  Result Value Ref Range   Sodium 137 135 - 145 mmol/L   Potassium 3.6 3.5 - 5.1 mmol/L   Chloride 104 98 - 111 mmol/L   CO2 23 22 - 32 mmol/L   Glucose, Bld 87 70 - 99 mg/dL   BUN 10 6 - 20 mg/dL   Creatinine, Ser 0.92 0.61 - 1.24 mg/dL   Calcium 9.1 8.9 - 10.3 mg/dL   GFR calc non Af Amer >60 >60 mL/min   GFR calc Af Amer >60 >60 mL/min   Anion gap 10 5 - 15   Ct Renal Stone Study  Result Date: 02/07/2019 CLINICAL DATA:  Initial evaluation for acute generalized abdominal pain. EXAM: CT ABDOMEN AND PELVIS WITHOUT CONTRAST TECHNIQUE: Multidetector CT imaging of the abdomen and pelvis was performed following the standard protocol without IV contrast. COMPARISON:  Prior CT from 12/25/2016. FINDINGS: Lower chest: Visualized lung bases are clear. Hepatobiliary: Mild diffuse hypoattenuation liver compatible steatosis. Liver otherwise unremarkable. Gallbladder surgically absent. No biliary dilatation. Pancreas: Pancreas within normal limits. Spleen: Spleen within normal limits. Adrenals/Urinary Tract: Adrenal glands are normal. Kidneys equal in size without evidence for nephrolithiasis or hydronephrosis. No discrete renal lesions. No radiopaque calculi seen along the course of either renal collecting system. No hydroureter. Bladder largely decompressed without acute finding. Stomach/Bowel: Stomach within normal limits. No evidence for bowel obstruction. Appendix is surgically absent. No acute inflammatory changes seen about the bowels. Vascular/Lymphatic:  Mild aorto bi-iliac atherosclerotic disease. No aneurysm. No adenopathy. Reproductive: Prostate seminal vesicles within normal limits. Other: No free air or fluid. Musculoskeletal: No acute osseous finding. No discrete  lytic or blastic osseous lesions. IMPRESSION: 1. No CT evidence for acute intra-abdominal or pelvic process. 2. Status post cholecystectomy and appendectomy. 3. Hepatic steatosis. Electronically Signed   By: Jeannine Boga M.D.   On: 02/07/2019 01:33     Procedures Procedures (including critical care time)  Medications Ordered in ED Medications  cephALEXin (KEFLEX) capsule 500 mg (has no administration in time range)  phenazopyridine (PYRIDIUM) tablet 200 mg (has no administration in time range)  dicyclomine (BENTYL) injection 20 mg (has no administration in time range)  ketorolac (TORADOL) injection 60 mg (has no administration in time range)      Well appearing, minor UTI.  No signs of sepsis.  Taking PO.  No indication for admission at this time.  Urine culture sent.  10 days of antibiotics.  Follow up with urology.   Final Clinical Impressions(s) / ED Diagnoses    Return for intractable cough, coughing up blood,fevers >100.4 unrelieved by medication, shortness of breath, intractable vomiting, chest pain, shortness of breath, weakness,numbness, changes in speech, facial asymmetry,abdominal pain, passing out,Inability to tolerate liquids or food, cough, altered mental status or any concerns. No signs of systemic illness or infection. The patient is nontoxic-appearing on exam and vital signs are within normal limits.   I have reviewed the triage vital signs and the nursing notes. Pertinent labs &imaging results that were available during my care of the patient were reviewed by me and considered in my medical decision making (see chart for details).  After history, exam, and medical workup I feel the patient has been appropriately medically screened and is safe for discharge home. Pertinent diagnoses were discussed with the patient. Patient was given return precautions   Penina Reisner, MD 02/07/19 0145

## 2019-02-06 NOTE — ED Triage Notes (Signed)
Pt reports painful urination for the past 4 days, hx of prostatitis and states this feels similar.

## 2019-02-07 ENCOUNTER — Emergency Department (HOSPITAL_COMMUNITY): Payer: 59

## 2019-02-07 ENCOUNTER — Encounter: Payer: Self-pay | Admitting: Family

## 2019-02-07 ENCOUNTER — Ambulatory Visit (INDEPENDENT_AMBULATORY_CARE_PROVIDER_SITE_OTHER): Payer: 59 | Admitting: Family

## 2019-02-07 DIAGNOSIS — R3989 Other symptoms and signs involving the genitourinary system: Secondary | ICD-10-CM | POA: Diagnosis not present

## 2019-02-07 DIAGNOSIS — R350 Frequency of micturition: Secondary | ICD-10-CM | POA: Diagnosis not present

## 2019-02-07 LAB — CBC WITH DIFFERENTIAL/PLATELET
Abs Immature Granulocytes: 0.04 10*3/uL (ref 0.00–0.07)
Basophils Absolute: 0 10*3/uL (ref 0.0–0.1)
Basophils Relative: 0 %
Eosinophils Absolute: 0.1 10*3/uL (ref 0.0–0.5)
Eosinophils Relative: 1 %
HCT: 41.5 % (ref 39.0–52.0)
Hemoglobin: 13.8 g/dL (ref 13.0–17.0)
Immature Granulocytes: 0 %
Lymphocytes Relative: 35 %
Lymphs Abs: 3.5 10*3/uL (ref 0.7–4.0)
MCH: 29.4 pg (ref 26.0–34.0)
MCHC: 33.3 g/dL (ref 30.0–36.0)
MCV: 88.5 fL (ref 80.0–100.0)
Monocytes Absolute: 0.7 10*3/uL (ref 0.1–1.0)
Monocytes Relative: 7 %
Neutro Abs: 5.6 10*3/uL (ref 1.7–7.7)
Neutrophils Relative %: 57 %
Platelets: 269 10*3/uL (ref 150–400)
RBC: 4.69 MIL/uL (ref 4.22–5.81)
RDW: 12.2 % (ref 11.5–15.5)
WBC: 10 10*3/uL (ref 4.0–10.5)
nRBC: 0 % (ref 0.0–0.2)

## 2019-02-07 LAB — BASIC METABOLIC PANEL
Anion gap: 10 (ref 5–15)
BUN: 10 mg/dL (ref 6–20)
CO2: 23 mmol/L (ref 22–32)
Calcium: 9.1 mg/dL (ref 8.9–10.3)
Chloride: 104 mmol/L (ref 98–111)
Creatinine, Ser: 0.92 mg/dL (ref 0.61–1.24)
GFR calc Af Amer: >60 mL/min (ref >60)
GFR calc non Af Amer: >60 mL/min (ref >60)
Glucose, Bld: 87 mg/dL (ref 70–99)
Potassium: 3.6 mmol/L (ref 3.5–5.1)
Sodium: 137 mmol/L (ref 135–145)

## 2019-02-07 MED ORDER — DICYCLOMINE HCL 20 MG PO TABS: 20.0000 mg | ORAL_TABLET | Freq: Two times a day (BID) | ORAL | 0 refills | Status: DC

## 2019-02-07 MED ORDER — CEPHALEXIN 500 MG PO CAPS: 500.0000 mg | ORAL_CAPSULE | Freq: Four times a day (QID) | ORAL | 0 refills | Status: DC

## 2019-02-07 NOTE — ED Notes (Signed)
Patient transported to CT 

## 2019-02-07 NOTE — Progress Notes (Signed)
Joshua Parrish is a 43 y.o. male with the following history as recorded in EpicCare:  Patient Active Problem List   Diagnosis Date Noted  . Viral URI with cough 09/06/2018  . Rotator cuff disorder, left 12/25/2017  . Bilateral shoulder pain 05/22/2017  . Left knee pain 05/22/2017  . Urinary frequency 02/09/2017  . Essential hypertension 01/16/2017  . Prostatitis 12/25/2016  . Sepsis ([place]) 12/25/2016  . GERD (gastroesophageal reflux disease) 12/25/2016  . Sinusitis 12/09/2016  . Tendinitis of right rotator cuff 12/09/2016  . Laryngopharyngeal reflux (LPR) 02/04/2016  . Mass in neck 11/27/2015  . Rotator cuff impingement syndrome of left shoulder 06/19/2015  . Acromioclavicular joint arthritis 06/19/2015    Current Outpatient Medications  Medication Sig Dispense Refill  . amLODipine (NORVASC) 5 MG tablet Take 1 tablet (5 mg total) by mouth daily. 90 tablet 1  . cephALEXin (KEFLEX) 500 MG capsule Take 1 capsule (500 mg total) by mouth 4 (four) times daily. 40 capsule 0  . cyclobenzaprine (FLEXERIL) 10 MG tablet Take 1 tablet (10 mg total) by mouth 2 (two) times daily as needed for muscle spasms. 20 tablet 0  . dicyclomine (BENTYL) 20 MG tablet Take 1 tablet (20 mg total) by mouth 2 (two) times daily. 20 tablet 0  . escitalopram (LEXAPRO) 10 MG tablet TAKE ONE TABLET BY MOUTH DAILY (Patient taking differently: Take 10 mg by mouth daily. ) 30 tablet 0  . fluticasone (FLONASE) 50 MCG/ACT nasal spray Place 2 sprays into both nostrils daily. 16 g 3  . gabapentin (NEURONTIN) 300 MG capsule Take 1 capsule (300 mg total) by mouth 3 (three) times daily. 90 capsule 1  . ibuprofen (ADVIL,MOTRIN) 800 MG tablet Take 1 tablet (800 mg total) by mouth 3 (three) times daily. (Patient taking differently: Take 800 mg by mouth 3 (three) times daily as needed for moderate pain. ) 21 tablet 0  . lisinopril-hydrochlorothiazide (ZESTORETIC) 20-12.5 MG tablet Take 2 tablets by mouth daily. 180 tablet 1  .  nitroGLYCERIN (NITRODUR - DOSED IN MG/24 HR) 0.2 mg/hr patch Cut and apply 1/4 patch to most painful area q24h. (Patient not taking: Reported on 02/06/2019) 30 patch 11  . pantoprazole (PROTONIX) 40 MG tablet Take 1 tablet (40 mg total) by mouth 2 (two) times daily. 180 tablet 1   No current facility-administered medications for this visit.     Allergies: Food  Past Medical History:  Diagnosis Date  . Chicken pox   . Depression   . Fatty liver   . GERD (gastroesophageal reflux disease)   . Migraines   . Osteoarthritis of shoulder    left  . Rotator cuff impingement syndrome of left shoulder 06/19/2015  . Sinusitis     Past Surgical History:  Procedure Laterality Date  . APPENDECTOMY    . CERVICAL LAMINECTOMY     has plates and fusion  . CHOLECYSTECTOMY N/A 12/24/2015   Procedure: LAPAROSCOPIC CHOLECYSTECTOMY ;  Surgeon: [name] Pickerel, MD;  Location: [facility];  Service: General;  Laterality: N/A;  . CLAVICLE EXCISION Left   . HYPOSPADIAS CORRECTION     as child  . KNEE SURGERY Bilateral    multiple  . SHOULDER ARTHROSCOPY WITH ROTATOR CUFF REPAIR Left 06/26/2015   Procedure: SHOULDER ARTHROSCOPY WITH ROTATOR CUFF REPAIR;  Surgeon: Elsie Saas, MD;  Location: [place];  Service: Orthopedics;  Laterality: Left;    Family History  Problem Relation Age of Onset  . Arthritis Mother   . Ovarian cancer Mother   .  Alcohol abuse Father   . Breast cancer Maternal Grandmother   . Cancer Maternal Grandmother        spinal surgery  . Breast cancer Maternal Grandfather   . Cancer Other   . Hypertension Other   . Diabetes Other   . Colon cancer Neg Hx     Social History   Tobacco Use  . Smoking status: Never Smoker  . Smokeless tobacco: Current User    Types: Chew  . Tobacco comment: form given 11/29/15  Substance Use Topics  . Alcohol use: Yes    Alcohol/week: 0.0 standard drinks    Comment: occasional    Subjective:    I connected with [name] on 02/07/19  at  1:20 PM EDT by a video enabled telemedicine application and verified that I am speaking with the correct person using two identifiers. Patient and I are the only 2 people on the video call today.   I discussed the limitations of evaluation and management by telemedicine and the availability of in person appointments. The patient expressed understanding and agreed to proceed.  Patient was seen at the ER yesterday and diagnosed with a UTI; was given a prescription for Keflex to take 4x per day; unfortunately, he was just able to get the prescription and has taken one tablet. He became concerned when he started to run a fever today of 100.5. He is still experiencing urinary frequency and diarrhea. Denies any nausea; CT done at ER did not show any type of kidney stone; Urine culture is still pending; has not contacted urology in follow up either.    Objective:  There were no vitals filed for this visit.  General: Well developed, well nourished, in no acute distress  Head: Normocephalic and atraumatic  Lungs: Respirations unlabored; Neurologic: Alert and oriented;   Assessment:  1. Urinary frequency   2. Suspected UTI     Plan:  Reviewed labs from ER that were done yesterday- CT is normal; urine culture is pending; offered to change his antibiotics but patient admits he had only taken the first pill earlier today; recommend to take the medication as prescribed and schedule follow-up with urology; increase water, okay to use Tylenol; will look for culture results and follow-up once those results are available; work note extended for today and tomorrow. Follow-up to be determined.   No follow-ups on file.  No orders of the defined types were placed in this encounter.   Requested Prescriptions    No prescriptions requested or ordered in this encounter

## 2019-02-07 NOTE — ED Notes (Signed)
Discharge instructions discussed with pt. Pt verbalized understanding of instructions and follow up appointments

## 2019-02-09 LAB — URINE CULTURE
Culture: >=100000 — AB
Special Requests: NORMAL

## 2019-02-10 ENCOUNTER — Telehealth: Payer: Self-pay | Admitting: *Deleted

## 2019-02-10 NOTE — Telephone Encounter (Signed)
Post ED Visit - Positive Culture Follow-up  Culture report reviewed by antimicrobial stewardship pharmacist: [place] Team []  Elenor Quinones, Pharm.D. []  Heide Guile, Pharm.D., BCPS AQ-ID []  Parks Neptune, Pharm.D., BCPS []  Alycia Rossetti, Pharm.D., BCPS []  [place], Pharm.D., BCPS, AAHIVP []  Legrand Como, Pharm.D., BCPS, AAHIVP []  Salome Arnt, PharmD, BCPS []  Johnnette Gourd, PharmD, BCPS []  Hughes Better, PharmD, BCPS []  Leeroy Cha, PharmD []  Laqueta Linden, PharmD, BCPS []  Albertina Parr, PharmD  [place] Team []  Leodis Sias, PharmD []  Lindell Spar, PharmD []  Royetta Asal, PharmD []  Graylin Shiver, Rph []  Rema Fendt) Glennon Mac, PharmD []  Arlyn Dunning, PharmD []  Netta Cedars, PharmD []  Dia Sitter, PharmD []  Leone Haven, PharmD []  Gretta Arab, PharmD []  Theodis Shove, PharmD []  Peggyann Juba, PharmD []  Reuel Boom, PharmD Isaias Sakai, PharmD  Positive urine culture Treated with Cephalexin, organism sensitive to the same and no further patient follow-up is required at this time.  Harlon Flor [facility] 02/10/2019, 9:09 AM

## 2019-03-05 ENCOUNTER — Other Ambulatory Visit: Payer: Self-pay | Admitting: Family

## 2019-04-23 ENCOUNTER — Other Ambulatory Visit: Payer: Self-pay | Admitting: Family

## 2019-06-08 ENCOUNTER — Other Ambulatory Visit: Payer: Self-pay | Admitting: Family

## 2019-06-08 DIAGNOSIS — E782 Mixed hyperlipidemia: Secondary | ICD-10-CM

## 2019-09-06 ENCOUNTER — Ambulatory Visit: Payer: 59 | Attending: Internal Medicine

## 2019-09-06 ENCOUNTER — Other Ambulatory Visit: Payer: Self-pay

## 2019-09-06 DIAGNOSIS — Z20822 Contact with and (suspected) exposure to covid-19: Secondary | ICD-10-CM

## 2019-09-08 LAB — NOVEL CORONAVIRUS, NAA: SARS-CoV-2, NAA: NOT DETECTED

## 2020-03-08 ENCOUNTER — Telehealth: Payer: Self-pay | Admitting: Family

## 2020-03-08 NOTE — Telephone Encounter (Signed)
Noted.Forwarding msg to PCP for FYI.Marland KitchenJohny Parrish
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
## 2020-07-19 ENCOUNTER — Other Ambulatory Visit: Payer: Self-pay

## 2020-07-19 ENCOUNTER — Ambulatory Visit (INDEPENDENT_AMBULATORY_CARE_PROVIDER_SITE_OTHER): Payer: 59

## 2020-07-19 ENCOUNTER — Ambulatory Visit: Payer: 59 | Admitting: Family Medicine

## 2020-07-19 VITALS — BP 152/102 | HR 93 | Ht 69.0 in | Wt 308.8 lb

## 2020-07-19 DIAGNOSIS — M542 Cervicalgia: Secondary | ICD-10-CM | POA: Diagnosis not present

## 2020-07-19 MED ORDER — GABAPENTIN 300 MG PO CAPS: 300.0000 mg | ORAL_CAPSULE | Freq: Three times a day (TID) | ORAL | 1 refills | Status: DC | PRN

## 2020-07-19 NOTE — Patient Instructions (Signed)
Thank you for coming in today.  Please get an Xray today before you leave  Contact [facility] and ask about weight gain on Lexapro and fluid retention.   For weight loss plan for 2200 calories per day as a goal.  Use food log application and measure the food for the first week.   [facility] in [place], [place] Address: [address], [place], [place] [address] Phone: [phone]  If the neck does not improve with heat and bit of time next step is PT.  Let me know if you want an order.     Cervical Strain and Sprain Rehab Ask your health care provider which exercises are safe for you. Do exercises exactly as told by your health care provider and adjust them as directed. It is normal to feel mild stretching, pulling, tightness, or discomfort as you do these exercises. Stop right away if you feel sudden pain or your pain gets worse. Do not begin these exercises until told by your health care provider. Stretching and range-of-motion exercises Cervical side bending  1. Using good posture, sit on a stable chair or stand up. 2. Without moving your shoulders, slowly tilt your left / right ear to your shoulder until you feel a stretch in the opposite side neck muscles. You should be looking straight ahead. 3. Hold for __________ seconds. 4. Repeat with the other side of your neck. Repeat __________ times. Complete this exercise __________ times a day. Cervical rotation  1. Using good posture, sit on a stable chair or stand up. 2. Slowly turn your head to the side as if you are looking over your left / right shoulder. ? Keep your eyes level with the ground. ? Stop when you feel a stretch along the side and the back of your neck. 3. Hold for __________ seconds. 4. Repeat this by turning to your other side. Repeat __________ times. Complete this exercise __________ times a day. Thoracic extension and pectoral stretch 1. Roll a towel or a  small blanket so it is about 4 inches (10 cm) in diameter. 2. Lie down on your back on a firm surface. 3. Put the towel lengthwise, under your spine in the middle of your back. It should not be under your shoulder blades. The towel should line up with your spine from your middle back to your lower back. 4. Put your hands behind your head and let your elbows fall out to your sides. 5. Hold for __________ seconds. Repeat __________ times. Complete this exercise __________ times a day. Strengthening exercises Isometric upper cervical flexion 1. Lie on your back with a thin pillow behind your head and a small rolled-up towel under your neck. 2. Gently tuck your chin toward your chest and nod your head down to look toward your feet. Do not lift your head off the pillow. 3. Hold for __________ seconds. 4. Release the tension slowly. Relax your neck muscles completely before you repeat this exercise. Repeat __________ times. Complete this exercise __________ times a day. Isometric cervical extension  1. Stand about 6 inches (15 cm) away from a wall, with your back facing the wall. 2. Place a soft object, about 6-8 inches (15-20 cm) in diameter, between the back of your head and the wall. A soft object could be a small pillow, a ball, or a folded towel. 3. Gently tilt your head back and press into the soft object. Keep your jaw and forehead relaxed. 4.  Hold for __________ seconds. 5. Release the tension slowly. Relax your neck muscles completely before you repeat this exercise. Repeat __________ times. Complete this exercise __________ times a day. Posture and body mechanics Body mechanics refers to the movements and positions of your body while you do your daily activities. Posture is part of body mechanics. Good posture and healthy body mechanics can help to relieve stress in your body's tissues and joints. Good posture means that your spine is in its natural S-curve position (your spine is neutral),  your shoulders are pulled back slightly, and your head is not tipped forward. The following are general guidelines for applying improved posture and body mechanics to your everyday activities. Sitting  1. When sitting, keep your spine neutral and keep your feet flat on the floor. Use a footrest, if necessary, and keep your thighs parallel to the floor. Avoid rounding your shoulders, and avoid tilting your head forward. 2. When working at a desk or a computer, keep your desk at a height where your hands are slightly lower than your elbows. Slide your chair under your desk so you are close enough to maintain good posture. 3. When working at a computer, place your monitor at a height where you are looking straight ahead and you do not have to tilt your head forward or downward to look at the screen. Standing   When standing, keep your spine neutral and keep your feet about hip-width apart. Keep a slight bend in your knees. Your ears, shoulders, and hips should line up.  When you do a task in which you stand in one place for a long time, place one foot up on a stable object that is 2-4 inches (5-10 cm) high, such as a footstool. This helps keep your spine neutral. Resting When lying down and resting, avoid positions that are most painful for you. Try to support your neck in a neutral position. You can use a contour pillow or a small rolled-up towel. Your pillow should support your neck but not push on it. This information is not intended to replace advice given to you by your health care provider. Make sure you discuss any questions you have with your health care provider. Document Revised: 12/08/2018 Document Reviewed: 05/19/2018 Elsevier Patient Education  [place].
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
## 2020-07-23 NOTE — Progress Notes (Signed)
Xray cspine shows solid fusion and hardware. No fracture. Evidence of neck spasm present.

## 2020-07-30 ENCOUNTER — Telehealth: Payer: Self-pay | Admitting: Family
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
## 2020-07-30 NOTE — Telephone Encounter (Signed)
Please let him know he is overdue for OV; I have not seen him for blood pressure follow-up since June 2020.

## 2020-07-30 NOTE — Telephone Encounter (Signed)
Voicemail left for patient to call office to schedule an ov.

## 2020-08-02 ENCOUNTER — Telehealth: Payer: Self-pay | Admitting: Family

## 2020-08-02 NOTE — Telephone Encounter (Signed)
It can be sent to [facility] [address] [place]

## 2020-08-02 NOTE — Telephone Encounter (Signed)
Patient notified that we cant not refill the prescription because it was not written by [place].

## 2020-08-02 NOTE — Telephone Encounter (Signed)
I am sorry but that medication is not on his list. I have not seen him in over a 1 1/2. Not sure who is writing this for him but he would need to contact them.

## 2020-08-02 NOTE — Telephone Encounter (Signed)
Patient is requesting a med refill for [place] .6mg . it is not on his current med list.  Last OV: 02/04/2019 Next OV: 12.6.2021

## 2020-08-06 ENCOUNTER — Encounter: Payer: Self-pay | Admitting: Family

## 2020-08-06 ENCOUNTER — Other Ambulatory Visit: Payer: Self-pay

## 2020-08-06 ENCOUNTER — Ambulatory Visit: Payer: 59 | Admitting: Family

## 2020-08-06 VITALS — BP 152/98 | HR 100 | Temp 98.4°F | Ht 69.0 in | Wt 307.0 lb

## 2020-08-06 DIAGNOSIS — M1A9XX0 Chronic gout, unspecified, without tophus (tophi): Secondary | ICD-10-CM | POA: Diagnosis not present

## 2020-08-06 DIAGNOSIS — F418 Other specified anxiety disorders: Secondary | ICD-10-CM | POA: Diagnosis not present

## 2020-08-06 DIAGNOSIS — E669 Obesity, unspecified: Secondary | ICD-10-CM

## 2020-08-06 DIAGNOSIS — I10 Essential (primary) hypertension: Secondary | ICD-10-CM | POA: Diagnosis not present

## 2020-08-06 MED ORDER — AMLODIPINE BESYLATE 5 MG PO TABS: 5.0000 mg | ORAL_TABLET | Freq: Every day | ORAL | 1 refills | Status: DC

## 2020-08-06 MED ORDER — LISINOPRIL-HYDROCHLOROTHIAZIDE 20-12.5 MG PO TABS: 2.0000 | ORAL_TABLET | Freq: Every day | ORAL | 1 refills | Status: DC

## 2020-08-06 MED ORDER — ESCITALOPRAM OXALATE 10 MG PO TABS: 10.0000 mg | ORAL_TABLET | Freq: Every day | ORAL | 1 refills | Status: DC

## 2020-08-06 MED ORDER — PREDNISONE 20 MG PO TABS: 40.0000 mg | ORAL_TABLET | Freq: Every day | ORAL | 0 refills | Status: DC

## 2020-08-06 NOTE — Progress Notes (Signed)
Joshua Parrish is a 44 y.o. male with the following history as recorded in EpicCare:  Patient Active Problem List   Diagnosis Date Noted  . Viral URI with cough 09/06/2018  . Rotator cuff disorder, left 12/25/2017  . Bilateral shoulder pain 05/22/2017  . Left knee pain 05/22/2017  . Urinary frequency 02/09/2017  . Essential hypertension 01/16/2017  . Prostatitis 12/25/2016  . Sepsis ([place]) 12/25/2016  . GERD (gastroesophageal reflux disease) 12/25/2016  . Sinusitis 12/09/2016  . Tendinitis of right rotator cuff 12/09/2016  . Laryngopharyngeal reflux (LPR) 02/04/2016  . Mass in neck 11/27/2015  . Rotator cuff impingement syndrome of left shoulder 06/19/2015  . Acromioclavicular joint arthritis 06/19/2015    Current Outpatient Medications  Medication Sig Dispense Refill  . gabapentin (NEURONTIN) 300 MG capsule Take 1 capsule (300 mg total) by mouth 3 (three) times daily as needed. 90 capsule 1  . omeprazole (PRILOSEC) 20 MG capsule Take 20 mg by mouth daily.    Marland Kitchen amLODipine (NORVASC) 5 MG tablet Take 1 tablet (5 mg total) by mouth daily. 90 tablet 1  . escitalopram (LEXAPRO) 10 MG tablet Take 1 tablet (10 mg total) by mouth daily. 90 tablet 1  . lisinopril-hydrochlorothiazide (ZESTORETIC) 20-12.5 MG tablet Take 2 tablets by mouth daily. 180 tablet 1  . predniSONE (DELTASONE) 20 MG tablet Take 2 tablets (40 mg total) by mouth daily with breakfast. 10 tablet 0   No current facility-administered medications for this visit.    Allergies: Food  Past Medical History:  Diagnosis Date  . Chicken pox   . Depression   . Fatty liver   . GERD (gastroesophageal reflux disease)   . Migraines   . Osteoarthritis of shoulder    left  . Rotator cuff impingement syndrome of left shoulder 06/19/2015  . Sinusitis     Past Surgical History:  Procedure Laterality Date  . APPENDECTOMY    . CERVICAL LAMINECTOMY     has plates and fusion  . CHOLECYSTECTOMY N/A 12/24/2015   Procedure: LAPAROSCOPIC  CHOLECYSTECTOMY ;  Surgeon: [name] Pickerel, MD;  Location: [facility];  Service: General;  Laterality: N/A;  . CLAVICLE EXCISION Left   . HYPOSPADIAS CORRECTION     as child  . KNEE SURGERY Bilateral    multiple  . SHOULDER ARTHROSCOPY WITH ROTATOR CUFF REPAIR Left 06/26/2015   Procedure: SHOULDER ARTHROSCOPY WITH ROTATOR CUFF REPAIR;  Surgeon: Elsie Saas, MD;  Location: [facility];  Service: Orthopedics;  Laterality: Left;    Family History  Problem Relation Age of Onset  . Arthritis Mother   . Ovarian cancer Mother   . Alcohol abuse Father   . Breast cancer Maternal Grandmother   . Cancer Maternal Grandmother        spinal surgery  . Breast cancer Maternal Grandfather   . Cancer Other   . Hypertension Other   . Diabetes Other   . Colon cancer Neg Hx     Social History   Tobacco Use  . Smoking status: Never Smoker  . Smokeless tobacco: Current User    Types: Chew  . Tobacco comment: form given 11/29/15  Substance Use Topics  . Alcohol use: Yes    Alcohol/week: 0.0 standard drinks    Comment: occasional    Subjective:  Patient has not been seen since June 2020; notes he has not taken his blood pressure medications since January 2021 due to financial issues; feels he is able to afford his medications again and would like  to re-start the 2 blood pressure medications and Lexapro; He was seen at [facility] earlier this year and diagnosed with gout; notes he has had a gout flare for the past week; requesting treatment today as well;    Objective:  Vitals:   08/06/20 1556  BP: (!) 152/98  Pulse: 100  Temp: 98.4 F (36.9 C)  TempSrc: Oral  SpO2: 96%  Weight: (!) 307 lb (139.3 kg)  Height: '5\' 9"'  (1.753 m)    General: Well developed, well nourished, in no acute distress  Head: Normocephalic and atraumatic  Lungs: Respirations unlabored; clear to auscultation bilaterally without wheeze, rales, rhonchi  CVS exam: normal rate and regular rhythm.  Musculoskeletal: No  deformities; no active joint inflammation; suspected gout flare right foot; Extremities: No edema, cyanosis, clubbing  Vessels: Symmetric bilaterally  Neurologic: Alert and oriented; speech intact; face symmetrical; moves all extremities well; CNII-XII intact without focal deficit   Assessment:  1. Essential hypertension   2. Chronic gout without tophus, unspecified cause, unspecified site   3. Situational anxiety   4. Obesity with serious comorbidity, unspecified classification, unspecified obesity type     Plan:  1. Re-start medications that he has used previously; patient notes that he felt very good on combination in the past; 2. Check uric acid level today; Rx for Prednisone 40 mg qd x 5 days; may need to consider Allopurinol daily; 3. Rx for Lexapro 10 mg daily; 4. Encouraged to work on diet and exercise; has gained 30 pounds since last seen here in  2020;  He understands to start taking the medications one at a time rather than all at once;  This visit occurred during the SARS-CoV-2 public health emergency.  Safety protocols were in place, including screening questions prior to the visit, additional usage of staff PPE, and extensive cleaning of exam room while observing appropriate contact time as indicated for disinfecting solutions.     No follow-ups on file.  Orders Placed This Encounter  Procedures  . Comp Met (CMET)    Standing Status:   Future    Number of Occurrences:   1    Standing Expiration Date:   08/06/2021  . CBC with Differential/Platelet    Standing Status:   Future    Number of Occurrences:   1    Standing Expiration Date:   08/06/2021  . Uric acid    Standing Status:   Future    Number of Occurrences:   1    Standing Expiration Date:   08/06/2021    Requested Prescriptions   Signed Prescriptions Disp Refills  . amLODipine (NORVASC) 5 MG tablet 90 tablet 1    Sig: Take 1 tablet (5 mg total) by mouth daily.  Marland Kitchen lisinopril-hydrochlorothiazide (ZESTORETIC)  20-12.5 MG tablet 180 tablet 1    Sig: Take 2 tablets by mouth daily.  Marland Kitchen escitalopram (LEXAPRO) 10 MG tablet 90 tablet 1    Sig: Take 1 tablet (10 mg total) by mouth daily.  . predniSONE (DELTASONE) 20 MG tablet 10 tablet 0    Sig: Take 2 tablets (40 mg total) by mouth daily with breakfast.

## 2020-08-06 NOTE — Patient Instructions (Signed)
Gout  Gout is a condition that causes painful swelling of the joints. Gout is a type of inflammation of the joints (arthritis). This condition is caused by having too much uric acid in the body. Uric acid is a chemical that forms when the body breaks down substances called purines. Purines are important for building body proteins. When the body has too much uric acid, sharp crystals can form and build up inside the joints. This causes pain and swelling. Gout attacks can happen quickly and may be very painful (acute gout). Over time, the attacks can affect more joints and become more frequent (chronic gout). Gout can also cause uric acid to build up under the skin and inside the kidneys. What are the causes? This condition is caused by too much uric acid in your blood. This can happen because:  Your kidneys do not remove enough uric acid from your blood. This is the most common cause.  Your body makes too much uric acid. This can happen with some cancers and cancer treatments. It can also occur if your body is breaking down too many red blood cells (hemolytic anemia).  You eat too many foods that are high in purines. These foods include organ meats and some seafood. Alcohol, especially beer, is also high in purines. A gout attack may be triggered by trauma or stress. What increases the risk? You are more likely to develop this condition if you:  Have a family history of gout.  Are male and middle-aged.  Are male and have gone through menopause.  Are obese.  Frequently drink alcohol, especially beer.  Are dehydrated.  Lose weight too quickly.  Have an organ transplant.  Have lead poisoning.  Take certain medicines, including aspirin, cyclosporine, diuretics, levodopa, and niacin.  Have kidney disease.  Have a skin condition called psoriasis. What are the signs or symptoms? An attack of acute gout happens quickly. It usually occurs in just one joint. The most common place is  the big toe. Attacks often start at night. Other joints that may be affected include joints of the feet, ankle, knee, fingers, wrist, or elbow. Symptoms of this condition may include:  Severe pain.  Warmth.  Swelling.  Stiffness.  Tenderness. The affected joint may be very painful to touch.  Shiny, red, or purple skin.  Chills and fever. Chronic gout may cause symptoms more frequently. More joints may be involved. You may also have white or yellow lumps (tophi) on your hands or feet or in other areas near your joints. How is this diagnosed? This condition is diagnosed based on your symptoms, medical history, and physical exam. You may have tests, such as:  Blood tests to measure uric acid levels.  Removal of joint fluid with a thin needle (aspiration) to look for uric acid crystals.  X-rays to look for joint damage. How is this treated? Treatment for this condition has two phases: treating an acute attack and preventing future attacks. Acute gout treatment may include medicines to reduce pain and swelling, including:  NSAIDs.  Steroids. These are strong anti-inflammatory medicines that can be taken by mouth (orally) or injected into a joint.  Colchicine. This medicine relieves pain and swelling when it is taken soon after an attack. It can be given by mouth or through an IV. Preventive treatment may include:  Daily use of smaller doses of NSAIDs or colchicine.  Use of a medicine that reduces uric acid levels in your blood.  Changes to your diet. You may   need to see a dietitian about what to eat and drink to prevent gout. Follow these instructions at home: During a gout attack   If directed, put ice on the affected area: ? Put ice in a plastic bag. ? Place a towel between your skin and the bag. ? Leave the ice on for 20 minutes, 2-3 times a day.  Raise (elevate) the affected joint above the level of your heart as often as possible.  Rest the joint as much as possible.  If the affected joint is in your leg, you may be given crutches to use.  Follow instructions from your health care provider about eating or drinking restrictions. Avoiding future gout attacks  Follow a low-purine diet as told by your dietitian or health care provider. Avoid foods and drinks that are high in purines, including liver, kidney, anchovies, asparagus, herring, mushrooms, mussels, and beer.  Maintain a healthy weight or lose weight if you are overweight. If you want to lose weight, talk with your health care provider. It is important that you do not lose weight too quickly.  Start or maintain an exercise program as told by your health care provider. Eating and drinking  Drink enough fluids to keep your urine pale yellow.  If you drink alcohol: ? Limit how much you use to:  0-1 drink a day for women.  0-2 drinks a day for men. ? Be aware of how much alcohol is in your drink. In the [place] one drink equals one 12 oz bottle of beer (355 mL) one 5 oz glass of wine (148 mL), or one 1 oz glass of hard liquor (44 mL). General instructions  Take over-the-counter and prescription medicines only as told by your health care provider.  Do not drive or use heavy machinery while taking prescription pain medicine.  Return to your normal activities as told by your health care provider. Ask your health care provider what activities are safe for you.  Keep all follow-up visits as told by your health care provider. This is important. Contact a health care provider if you have:  Another gout attack.  Continuing symptoms of a gout attack after 10 days of treatment.  Side effects from your medicines.  Chills or a fever.  Burning pain when you urinate.  Pain in your lower back or belly. Get help right away if you:  Have severe or uncontrolled pain.  Cannot urinate. Summary  Gout is painful swelling of the joints caused by inflammation.  The most common site of pain is the big  toe, but it can affect other joints in the body.  Medicines and dietary changes can help to prevent and treat gout attacks. This information is not intended to replace advice given to you by your health care provider. Make sure you discuss any questions you have with your health care provider. Document Revised: 03/10/2018 Document Reviewed: 03/10/2018 Elsevier Patient Education  2020 [employer]  

## 2020-08-07 ENCOUNTER — Other Ambulatory Visit: Payer: Self-pay | Admitting: Family

## 2020-08-07 LAB — COMPREHENSIVE METABOLIC PANEL
ALT: 38 U/L (ref 0–53)
AST: 25 U/L (ref 0–37)
Albumin: 4.6 g/dL (ref 3.5–5.2)
Alkaline Phosphatase: 70 U/L (ref 39–117)
BUN: 13 mg/dL (ref 6–23)
CO2: 27 mEq/L (ref 19–32)
Calcium: 9.4 mg/dL (ref 8.4–10.5)
Chloride: 100 mEq/L (ref 96–112)
Creatinine, Ser: 0.97 mg/dL (ref 0.40–1.50)
GFR: 94.44 mL/min (ref >60.00)
Glucose, Bld: 80 mg/dL (ref 70–99)
Potassium: 4.3 mEq/L (ref 3.5–5.1)
Sodium: 135 mEq/L (ref 135–145)
Total Bilirubin: 0.4 mg/dL (ref 0.2–1.2)
Total Protein: 7.7 g/dL (ref 6.0–8.3)

## 2020-08-07 LAB — CBC WITH DIFFERENTIAL/PLATELET
Basophils Absolute: 0.1 10*3/uL (ref 0.0–0.1)
Basophils Relative: 1.1 % (ref 0.0–3.0)
Eosinophils Absolute: 0 10*3/uL (ref 0.0–0.7)
Eosinophils Relative: 0.4 % (ref 0.0–5.0)
HCT: 41.1 % (ref 39.0–52.0)
Hemoglobin: 13.9 g/dL (ref 13.0–17.0)
Lymphocytes Relative: 32.8 % (ref 12.0–46.0)
Lymphs Abs: 3.2 10*3/uL (ref 0.7–4.0)
MCHC: 33.8 g/dL (ref 30.0–36.0)
MCV: 87.7 fl (ref 78.0–100.0)
Monocytes Absolute: 0.5 10*3/uL (ref 0.1–1.0)
Monocytes Relative: 4.7 % (ref 3.0–12.0)
Neutro Abs: 5.9 10*3/uL (ref 1.4–7.7)
Neutrophils Relative %: 61 % (ref 43.0–77.0)
Platelets: 322 10*3/uL (ref 150.0–400.0)
RBC: 4.69 Mil/uL (ref 4.22–5.81)
RDW: 13.4 % (ref 11.5–15.5)
WBC: 9.7 10*3/uL (ref 4.0–10.5)

## 2020-08-07 LAB — URIC ACID: Uric Acid, Serum: 8 mg/dL — ABNORMAL HIGH (ref 4.0–7.8)

## 2020-08-07 MED ORDER — ALLOPURINOL 100 MG PO TABS: 100.0000 mg | ORAL_TABLET | Freq: Every day | ORAL | 3 refills | Status: DC

## 2020-08-14 ENCOUNTER — Telehealth: Payer: 59 | Admitting: Physician Assistant

## 2020-08-14 DIAGNOSIS — M109 Gout, unspecified: Secondary | ICD-10-CM

## 2020-08-14 MED ORDER — PREDNISONE 20 MG PO TABS: 40.0000 mg | ORAL_TABLET | Freq: Every day | ORAL | 0 refills | Status: AC

## 2020-08-14 NOTE — Progress Notes (Signed)
E-Visit for Gout  We are sorry that you are not feeling well. We are here to help!  Based on what you shared with me it looks like you have a flare of your gout.  Gout is a form of arthritis. It can cause pain and swelling in the joints. At first, it tends to affect only 1 joint - most frequently the big toe. It happens in people who have too much uric acid in the blood. Uric acid is a chemical that is produced when the body breaks down certain foods. Uric acid can form sharp needle-like crystals that build up in the joints and cause pain. Uric acid crystals can also form inside the tubes that carry urine from the kidneys to the bladder. These crystals can turn into "kidney stones" that can cause pain and problems with the flow of urine. People with gout get sudden "flares" or attacks of severe pain, most often the big toe, ankle, or knee. Often the joint also turns red and swells. Usually, only 1 joint is affected, but some people have pain in more than 1 joint. Gout flares tend to happen more often during the night.  The pain from gout can be extreme. The pain and swelling are worst at the beginning of a gout flare. The symptoms then get better within a few days to weeks. It is not clear how the body "turns off" a gout flare.  Do not start any NEW preventative medicine until the gout has cleared completely. However, If you are already on Probenecid or Allopurinol for CHRONIC gout, you may continue taking this during an active flare up  I have prescribed Prednisone 40 mg daily for 7 days   HOME CARE Losing weight can help relieve gout. It's not clear that following a specific diet plan will help with gout symptoms but eating a balanced diet can help improve your overall health. It can also help you lose weight, if you are overweight. In general, a healthy diet includes plenty of fruits, vegetables, whole grains, and low-fat dairy products (labelled "low fat", skim, 2%). Avoid sugar sweetened drinks  (including sodas, tea, juice and juice blends, coffee drinks and sports drinks) Limit alcohol to 1-2 drinks of beer, spirits or wine daily these can make gout flares worse. Some people with gout also have other health problems, such as heart disease, high blood pressure, kidney disease, or obesity. If you have any of these issues, it's important to work with your doctor to manage them. This can help improve your overall health and might also help with your gout.  GET HELP RIGHT AWAY IF: . Your symptoms persist after you have completed your treatment plan . You develop severe diarrhea . You develop abnormal sensations .  You develop vomiting,  .  You develop weakness .  You develop abdominal pain  FOLLOW UP WITH YOUR PRIMARY PROVIDER IF: . If your symptoms do not improve within 10 days  MAKE SURE YOU   Understand these instructions.  Will watch your condition.  Will get help right away if you are not doing well or get worse.  Your e-visit answers were reviewed by a board certified advanced clinical practitioner to complete your personal care plan. Depending upon the condition, your plan could have included both over the counter or prescription medications.  Your safety is important to [place]. If you have drug allergies check your prescription carefully.   You can use MyChart to ask questions about today's visit, request a non-urgent  call back, or ask for a work or school excuse for 24 hours related to this e-Visit. If it has been greater than 24 hours you will need to follow up with your provider, or enter a new e-Visit to address those concerns. You will get an e-mail with a link to a survey asking about your experience.  We hope that your e-visit has been valuable and will speed your recovery! Thank you for using e-visits.  Greater than 5 minutes, yet less than 10 minutes of time have been spent researching, coordinating, and implementing care for this patient today

## 2020-08-28 ENCOUNTER — Telehealth: Payer: 59 | Admitting: Physician Assistant

## 2020-08-28 DIAGNOSIS — M436 Torticollis: Secondary | ICD-10-CM

## 2020-08-28 NOTE — Progress Notes (Signed)
Based on what you shared with me, I feel your condition warrants further evaluation and I recommend that you be seen for a face to face office visit. You neck stiffness could be a sign of a more serious infection or a complication of your sinus infection. Additionally you will need to be tested for Flu and COVID.   NOTE: If you entered your credit card information for this eVisit, you will not be charged. You may see a "hold" on your card for the $35 but that hold will drop off and you will not have a charge processed.   If you are having a true medical emergency please call 911.      For an urgent face to face visit, [place] has five urgent care centers for your convenience:     [facility] at [facility] Directions [phone] [address] [place], [place] [address]  10 am - 6pm Monday - Friday    [facility]) Get Driving Directions [phone] [address] [place], [place] [address]  10 am to 8 pm Monday-Friday  12 pm to 8 pm [facility] at [facility] Get Driving Directions [phone] [address] [place], [place] [address]  8 am to 8 pm Monday-Friday  9 am to 6 pm Saturday  11 am to 6 pm Sunday     [facility] at [facility] Get Driving Directions  [phone] [address] [place], [place] [address]  8 am to 8 pm Monday-Friday  8 am to 4 pm [facility] at [facility] Directions [phone] [address] [place], [place] [address]  12 pm to 6 pm Monday-Friday      Your e-visit answers were reviewed by a board certified advanced clinical practitioner to complete your personal care plan.  Thank you for using e-Visits.    Greater than 5 minutes, yet less than 10 minutes of time have been spent researching, coordinating, and implementing  care for this patient today

## 2020-10-12 ENCOUNTER — Ambulatory Visit: Payer: 59 | Admitting: Family

## 2020-10-26 ENCOUNTER — Ambulatory Visit (INDEPENDENT_AMBULATORY_CARE_PROVIDER_SITE_OTHER): Payer: 59

## 2020-10-26 ENCOUNTER — Other Ambulatory Visit: Payer: Self-pay

## 2020-10-26 ENCOUNTER — Encounter: Payer: Self-pay | Admitting: Family

## 2020-10-26 ENCOUNTER — Ambulatory Visit: Payer: 59 | Admitting: Family

## 2020-10-26 VITALS — BP 138/92 | HR 105 | Temp 98.8°F | Wt 306.8 lb

## 2020-10-26 DIAGNOSIS — I10 Essential (primary) hypertension: Secondary | ICD-10-CM

## 2020-10-26 DIAGNOSIS — M109 Gout, unspecified: Secondary | ICD-10-CM | POA: Diagnosis not present

## 2020-10-26 DIAGNOSIS — R059 Cough, unspecified: Secondary | ICD-10-CM

## 2020-10-26 DIAGNOSIS — R0683 Snoring: Secondary | ICD-10-CM

## 2020-10-26 DIAGNOSIS — G4719 Other hypersomnia: Secondary | ICD-10-CM

## 2020-10-26 DIAGNOSIS — L089 Local infection of the skin and subcutaneous tissue, unspecified: Secondary | ICD-10-CM

## 2020-10-26 MED ORDER — CEPHALEXIN 500 MG PO CAPS: 500.0000 mg | ORAL_CAPSULE | Freq: Three times a day (TID) | ORAL | 0 refills | Status: DC

## 2020-10-26 NOTE — Progress Notes (Signed)
Joshua Parrish is a 45 y.o. male with the following history as recorded in EpicCare:  Patient Active Problem List   Diagnosis Date Noted  . Viral URI with cough 09/06/2018  . Rotator cuff disorder, left 12/25/2017  . Bilateral shoulder pain 05/22/2017  . Left knee pain 05/22/2017  . Urinary frequency 02/09/2017  . Essential hypertension 01/16/2017  . Prostatitis 12/25/2016  . Sepsis ([place]) 12/25/2016  . GERD (gastroesophageal reflux disease) 12/25/2016  . Sinusitis 12/09/2016  . Tendinitis of right rotator cuff 12/09/2016  . Laryngopharyngeal reflux (LPR) 02/04/2016  . Mass in neck 11/27/2015  . Rotator cuff impingement syndrome of left shoulder 06/19/2015  . Acromioclavicular joint arthritis 06/19/2015    Current Outpatient Medications  Medication Sig Dispense Refill  . allopurinol (ZYLOPRIM) 100 MG tablet Take 1 tablet (100 mg total) by mouth daily. 30 tablet 3  . amLODipine (NORVASC) 5 MG tablet Take 1 tablet (5 mg total) by mouth daily. 90 tablet 1  . cephALEXin (KEFLEX) 500 MG capsule Take 1 capsule (500 mg total) by mouth 3 (three) times daily. 15 capsule 0  . escitalopram (LEXAPRO) 10 MG tablet Take 1 tablet (10 mg total) by mouth daily. 90 tablet 1  . gabapentin (NEURONTIN) 300 MG capsule Take 1 capsule (300 mg total) by mouth 3 (three) times daily as needed. 90 capsule 1  . lisinopril-hydrochlorothiazide (ZESTORETIC) 20-12.5 MG tablet Take 2 tablets by mouth daily. 180 tablet 1  . omeprazole (PRILOSEC) 20 MG capsule Take 20 mg by mouth daily.     No current facility-administered medications for this visit.    Allergies: Food  Past Medical History:  Diagnosis Date  . Chicken pox   . Depression   . Fatty liver   . GERD (gastroesophageal reflux disease)   . Migraines   . Osteoarthritis of shoulder    left  . Rotator cuff impingement syndrome of left shoulder 06/19/2015  . Sinusitis     Past Surgical History:  Procedure Laterality Date  . APPENDECTOMY    . CERVICAL  LAMINECTOMY     has plates and fusion  . CHOLECYSTECTOMY N/A 12/24/2015   Procedure: LAPAROSCOPIC CHOLECYSTECTOMY ;  Surgeon: [name] Pickerel, MD;  Location: [facility];  Service: General;  Laterality: N/A;  . CLAVICLE EXCISION Left   . HYPOSPADIAS CORRECTION     as child  . KNEE SURGERY Bilateral    multiple  . SHOULDER ARTHROSCOPY WITH ROTATOR CUFF REPAIR Left 06/26/2015   Procedure: SHOULDER ARTHROSCOPY WITH ROTATOR CUFF REPAIR;  Surgeon: Elsie Saas, MD;  Location: [facility];  Service: Orthopedics;  Laterality: Left;    Family History  Problem Relation Age of Onset  . Arthritis Mother   . Ovarian cancer Mother   . Alcohol abuse Father   . Breast cancer Maternal Grandmother   . Cancer Maternal Grandmother        spinal surgery  . Breast cancer Maternal Grandfather   . Cancer Other   . Hypertension Other   . Diabetes Other   . Colon cancer Neg Hx     Social History   Tobacco Use  . Smoking status: Never Smoker  . Smokeless tobacco: Current User    Types: Chew  . Tobacco comment: form given 11/29/15  Substance Use Topics  . Alcohol use: Yes    Alcohol/week: 0.0 standard drinks    Comment: occasional    Subjective:  2 month follow-up on hypertension/ gout; Notes he had COVID in early February and still complaining  of persisting cough;   Objective:  Vitals:   10/26/20 1559  BP: (!) 138/92  Pulse: (!) 105  Temp: 98.8 F (37.1 C)  TempSrc: Oral  SpO2: 97%  Weight: (!) 306 lb 12.8 oz (139.2 kg)    General: Well developed, well nourished, in no acute distress  Skin : Warm and dry.  Head: Normocephalic and atraumatic  Eyes: Sclera and conjunctiva clear; pupils round and reactive to light; extraocular movements intact  Ears: Right upper ear lobe red/ swollen; Lungs: Respirations unlabored; clear to auscultation bilaterally without wheeze, rales, rhonchi  CVS exam: normal rate and regular rhythm.  Neurologic: Alert and oriented; speech intact; face  symmetrical; moves all extremities well; CNII-XII intact without focal deficit   Assessment:  1. Gout, unspecified cause, unspecified chronicity, unspecified site   2. Essential hypertension   3. Snoring   4. Excessive daytime sleepiness   5. Cough   6. Skin infection     Plan:  1. Check uric acid level today; continue Allopurinol; discussed that HCTZ could make gout worse but patient notes he is feeling better with addition of Allopurinol; 2. Increase Amlodipine to 10 mg daily; 3. & 4. Refer for sleep study; 5. ? COVID related or related to Lisinopril; he has taken Lisinopril in the past with no difficulty; update CXR today; if persists, to consider changing from Lisinopril; follow up in 1 month; 6. Rx for Keflex 500 mg tid x 5 days; re-evaluate ear lobe at next OV;  This visit occurred during the SARS-CoV-2 public health emergency.  Safety protocols were in place, including screening questions prior to the visit, additional usage of staff PPE, and extensive cleaning of exam room while observing appropriate contact time as indicated for disinfecting solutions.     Return in about 1 month (around 11/23/2020).  Orders Placed This Encounter  Procedures  . DG Chest 2 View    Standing Status:   Future    Number of Occurrences:   1    Standing Expiration Date:   10/26/2021    Order Specific Question:   Reason for Exam (SYMPTOM  OR DIAGNOSIS REQUIRED)    Answer:   cough x 3 weeks    Order Specific Question:   Preferred imaging location?    Answer:   [facility]  . Comp Met ([facility])    Standing Status:   Future    Number of Occurrences:   1    Standing Expiration Date:   10/26/2021  . Uric acid    Standing Status:   Future    Number of Occurrences:   1    Standing Expiration Date:   10/26/2021  . Uric acid    Standing Status:   Future    Number of Occurrences:   1    Standing Expiration Date:   10/26/2021  . Comprehensive metabolic panel    Standing Status:   Future    Number  of Occurrences:   1    Standing Expiration Date:   10/26/2021  . Ambulatory referral to Neurology    Referral Priority:   Routine    Referral Type:   Consultation    Referral Reason:   Specialty Services Required    Requested Specialty:   Neurology    Number of Visits Requested:   1    Requested Prescriptions   Signed Prescriptions Disp Refills  . cephALEXin (KEFLEX) 500 MG capsule 15 capsule 0    Sig: Take 1 capsule (500 mg total)  by mouth 3 (three) times daily.

## 2020-10-26 NOTE — Patient Instructions (Signed)
Increase the Amlodipine to 10 mg daily;

## 2020-10-27 LAB — COMPREHENSIVE METABOLIC PANEL
AG Ratio: 1.7 (calc) (ref 1.0–2.5)
ALT: 29 U/L (ref 9–46)
AST: 20 U/L (ref 10–40)
Albumin: 4.7 g/dL (ref 3.6–5.1)
Alkaline phosphatase (APISO): 62 U/L (ref 36–130)
BUN: 11 mg/dL (ref 7–25)
CO2: 30 mmol/L (ref 20–32)
Calcium: 9.6 mg/dL (ref 8.6–10.3)
Chloride: 100 mmol/L (ref 98–110)
Creat: 1.01 mg/dL (ref 0.60–1.35)
Globulin: 2.7 g/dL (calc) (ref 1.9–3.7)
Glucose, Bld: 84 mg/dL (ref 65–99)
Potassium: 4.2 mmol/L (ref 3.5–5.3)
Sodium: 139 mmol/L (ref 135–146)
Total Bilirubin: 0.6 mg/dL (ref 0.2–1.2)
Total Protein: 7.4 g/dL (ref 6.1–8.1)

## 2020-10-27 LAB — URIC ACID: Uric Acid, Serum: 8.1 mg/dL — ABNORMAL HIGH (ref 4.0–8.0)

## 2020-10-29 ENCOUNTER — Other Ambulatory Visit: Payer: Self-pay | Admitting: Family

## 2020-10-29 MED ORDER — ALLOPURINOL 300 MG PO TABS: 300.0000 mg | ORAL_TABLET | Freq: Every day | ORAL | 0 refills | Status: DC

## 2020-11-05 ENCOUNTER — Other Ambulatory Visit: Payer: Self-pay | Admitting: Family

## 2020-11-05 MED ORDER — AMLODIPINE BESYLATE 10 MG PO TABS: 10.0000 mg | ORAL_TABLET | Freq: Every day | ORAL | 0 refills | Status: DC

## 2020-11-09 ENCOUNTER — Encounter: Payer: Self-pay | Admitting: Family

## 2020-11-12 ENCOUNTER — Ambulatory Visit: Payer: 59 | Admitting: Family

## 2020-11-12 ENCOUNTER — Encounter: Payer: Self-pay | Admitting: Family

## 2020-11-12 ENCOUNTER — Other Ambulatory Visit: Payer: Self-pay

## 2020-11-12 VITALS — BP 142/98 | HR 104 | Temp 98.4°F | Ht 69.0 in | Wt 303.6 lb

## 2020-11-12 DIAGNOSIS — I10 Essential (primary) hypertension: Secondary | ICD-10-CM | POA: Diagnosis not present

## 2020-11-12 DIAGNOSIS — M542 Cervicalgia: Secondary | ICD-10-CM

## 2020-11-12 DIAGNOSIS — L0390 Cellulitis, unspecified: Secondary | ICD-10-CM | POA: Diagnosis not present

## 2020-11-12 MED ORDER — METHOCARBAMOL 500 MG PO TABS: 500.0000 mg | ORAL_TABLET | Freq: Every evening | ORAL | 0 refills | Status: DC | PRN

## 2020-11-12 MED ORDER — VALSARTAN-HYDROCHLOROTHIAZIDE 320-25 MG PO TABS: 1.0000 | ORAL_TABLET | Freq: Every day | ORAL | 0 refills | Status: DC

## 2020-11-12 MED ORDER — SULFAMETHOXAZOLE-TRIMETHOPRIM 800-160 MG PO TABS: 1.0000 | ORAL_TABLET | Freq: Two times a day (BID) | ORAL | 0 refills | Status: DC

## 2020-11-12 MED ORDER — MELOXICAM 15 MG PO TABS: 15.0000 mg | ORAL_TABLET | Freq: Every day | ORAL | 0 refills | Status: DC

## 2020-11-12 NOTE — Progress Notes (Signed)
Joshua Parrish is a 45 y.o. male with the following history as recorded in EpicCare:  Patient Active Problem List   Diagnosis Date Noted  . Viral URI with cough 09/06/2018  . Rotator cuff disorder, left 12/25/2017  . Bilateral shoulder pain 05/22/2017  . Left knee pain 05/22/2017  . Urinary frequency 02/09/2017  . Essential hypertension 01/16/2017  . Prostatitis 12/25/2016  . Sepsis ([place]) 12/25/2016  . GERD (gastroesophageal reflux disease) 12/25/2016  . Sinusitis 12/09/2016  . Tendinitis of right rotator cuff 12/09/2016  . Laryngopharyngeal reflux (LPR) 02/04/2016  . Mass in neck 11/27/2015  . Rotator cuff impingement syndrome of left shoulder 06/19/2015  . Acromioclavicular joint arthritis 06/19/2015    Current Outpatient Medications  Medication Sig Dispense Refill  . meloxicam (MOBIC) 15 MG tablet Take 1 tablet (15 mg total) by mouth daily. 30 tablet 0  . methocarbamol (ROBAXIN) 500 MG tablet Take 1 tablet (500 mg total) by mouth at bedtime as needed. 30 tablet 0  . sulfamethoxazole-trimethoprim (BACTRIM DS) 800-160 MG tablet Take 1 tablet by mouth 2 (two) times daily. 14 tablet 0  . valsartan-hydrochlorothiazide (DIOVAN HCT) 320-25 MG tablet Take 1 tablet by mouth daily. 90 tablet 0  . allopurinol (ZYLOPRIM) 300 MG tablet Take 1 tablet (300 mg total) by mouth daily. 90 tablet 0  . amLODipine (NORVASC) 10 MG tablet Take 1 tablet (10 mg total) by mouth daily. 90 tablet 0  . cephALEXin (KEFLEX) 500 MG capsule Take 1 capsule (500 mg total) by mouth 3 (three) times daily. 15 capsule 0  . escitalopram (LEXAPRO) 10 MG tablet Take 1 tablet (10 mg total) by mouth daily. 90 tablet 1  . gabapentin (NEURONTIN) 300 MG capsule Take 1 capsule (300 mg total) by mouth 3 (three) times daily as needed. 90 capsule 1  . omeprazole (PRILOSEC) 20 MG capsule Take 20 mg by mouth daily.     No current facility-administered medications for this visit.    Allergies: Food and Lisinopril  Past Medical  History:  Diagnosis Date  . Chicken pox   . Depression   . Fatty liver   . GERD (gastroesophageal reflux disease)   . Migraines   . Osteoarthritis of shoulder    left  . Rotator cuff impingement syndrome of left shoulder 06/19/2015  . Sinusitis     Past Surgical History:  Procedure Laterality Date  . APPENDECTOMY    . CERVICAL LAMINECTOMY     has plates and fusion  . CHOLECYSTECTOMY N/A 12/24/2015   Procedure: LAPAROSCOPIC CHOLECYSTECTOMY ;  Surgeon: [name] Pickerel, MD;  Location: [facility];  Service: General;  Laterality: N/A;  . CLAVICLE EXCISION Left   . HYPOSPADIAS CORRECTION     as child  . KNEE SURGERY Bilateral    multiple  . SHOULDER ARTHROSCOPY WITH ROTATOR CUFF REPAIR Left 06/26/2015   Procedure: SHOULDER ARTHROSCOPY WITH ROTATOR CUFF REPAIR;  Surgeon: Elsie Saas, MD;  Location: [facility];  Service: Orthopedics;  Laterality: Left;    Family History  Problem Relation Age of Onset  . Arthritis Mother   . Ovarian cancer Mother   . Alcohol abuse Father   . Breast cancer Maternal Grandmother   . Cancer Maternal Grandmother        spinal surgery  . Breast cancer Maternal Grandfather   . Cancer Other   . Hypertension Other   . Diabetes Other   . Colon cancer Neg Hx     Social History   Tobacco Use  .  Smoking status: Never Smoker  . Smokeless tobacco: Current User    Types: Chew  . Tobacco comment: form given 11/29/15  Substance Use Topics  . Alcohol use: Yes    Alcohol/week: 0.0 standard drinks    Comment: occasional    Subjective:   Presents today with numerous concerns:  1) Neck pain x 1 week; painful to turn head; using Advil and ICY hot; noticed that he sleeps in "weird positions" at night- scheduled for sleep study at the end of the month; normal CXR in 07/2020 with similar symptoms- showed muscle spasm at that time;   2) Would like to discontinue the Lisinopril- cough continues to be problematic; normal CXR recently;   3) Still  concerned about possible infection in upper right ear lobe- limited response to Keflex;    Objective:  Vitals:   11/12/20 1511  BP: (!) 142/98  Pulse: (!) 104  Temp: 98.4 F (36.9 C)  TempSrc: Oral  SpO2: 98%  Weight: (!) 303 lb 9.6 oz (137.7 kg)  Height: 5\' 9"  (1.753 m)    General: Well developed, well nourished, in no acute distress  Skin : Warm and dry.  Head: Normocephalic and atraumatic  Eyes: Sclera and conjunctiva clear; pupils round and reactive to light; extraocular movements intact  Ears: External normal; canals clear; tympanic membranes normal  Oropharynx: Pink, supple. No suspicious lesions  Neck: Supple without thyromegaly, adenopathy  Lungs: Respirations unlabored; clear to auscultation bilaterally without wheeze, rales, rhonchi  CVS exam: normal rate and regular rhythm.  Musculoskeletal: No deformities; no active joint inflammation  Extremities: No edema, cyanosis, clubbing  Vessels: Symmetric bilaterally  Neurologic: Alert and oriented; speech intact; face symmetrical; moves all extremities well; CNII-XII intact without focal deficit   Assessment:  1. Neck pain   2. Essential hypertension   3. Cellulitis, unspecified cellulitis site     Plan:  1. Suspect muscular; Rx for Robaxin and Mobic; may need to refer back to sports medicine; 2. Uncontrolled/ ? ACE cough; d/c Lisinopril HCT and change to Diovan HCT 320/25 daily; continue Amlodipine daily;  3. Rx for Bactrim DS bid x 5 days; stressed need to put warm compresses on the ear lobe;   Careful review with patient regarding how to start all the new medications- he understands not to take them all together on the same day; introduce slowly; Plan to follow-up in 1 month, sooner prn.   This visit occurred during the SARS-CoV-2 public health emergency.  Safety protocols were in place, including screening questions prior to the visit, additional usage of staff PPE, and extensive cleaning of exam room while observing  appropriate contact time as indicated for disinfecting solutions.     No follow-ups on file.  No orders of the defined types were placed in this encounter.   Requested Prescriptions   Signed Prescriptions Disp Refills  . valsartan-hydrochlorothiazide (DIOVAN HCT) 320-25 MG tablet 90 tablet 0    Sig: Take 1 tablet by mouth daily.  . meloxicam (MOBIC) 15 MG tablet 30 tablet 0    Sig: Take 1 tablet (15 mg total) by mouth daily.  . methocarbamol (ROBAXIN) 500 MG tablet 30 tablet 0    Sig: Take 1 tablet (500 mg total) by mouth at bedtime as needed.  . sulfamethoxazole-trimethoprim (BACTRIM DS) 800-160 MG tablet 14 tablet 0    Sig: Take 1 tablet by mouth 2 (two) times daily.

## 2020-11-28 ENCOUNTER — Encounter: Payer: Self-pay | Admitting: Neurology

## 2020-11-28 ENCOUNTER — Other Ambulatory Visit: Payer: Self-pay

## 2020-11-28 ENCOUNTER — Ambulatory Visit: Payer: 59 | Admitting: Neurology

## 2020-11-28 VITALS — BP 132/90 | HR 87 | Ht 69.0 in | Wt 306.5 lb

## 2020-11-28 DIAGNOSIS — G4719 Other hypersomnia: Secondary | ICD-10-CM | POA: Diagnosis not present

## 2020-11-28 DIAGNOSIS — R0683 Snoring: Secondary | ICD-10-CM

## 2020-11-28 DIAGNOSIS — R519 Headache, unspecified: Secondary | ICD-10-CM

## 2020-11-28 DIAGNOSIS — Z9189 Other specified personal risk factors, not elsewhere classified: Secondary | ICD-10-CM

## 2020-11-28 DIAGNOSIS — R0681 Apnea, not elsewhere classified: Secondary | ICD-10-CM

## 2020-11-28 DIAGNOSIS — Z6841 Body Mass Index (BMI) 40.0 and over, adult: Secondary | ICD-10-CM

## 2020-11-28 DIAGNOSIS — R635 Abnormal weight gain: Secondary | ICD-10-CM

## 2020-11-28 DIAGNOSIS — R351 Nocturia: Secondary | ICD-10-CM

## 2020-11-28 NOTE — Patient Instructions (Signed)

## 2020-11-28 NOTE — Progress Notes (Signed)
Subjective:    Patient ID: Joshua Parrish is a 45 y.o. male.  HPI     Joshua Age, MD, PhD [facility] [address] [place], [place] [address]  Dear Joshua Parrish,   I saw your patient, Joshua Parrish, upon your kind request in my sleep clinic today for initial consultation of his sleep disorder, in particular, concern for underlying obstructive sleep apnea.  The patient is unaccompanied today.  As you know, Joshua Parrish is a 45 year old right-handed gentleman with an underlying medical history of gout, reflux disease, migraine headaches, osteoarthritis,, status post multiple surgeries including several knee surgeries, shoulder surgeries and neck surgery.  Sinusitis, hypertension, and severe obesity with a BMI of over 45, who reports snoring and excessive daytime somnolence. I reviewed your office note from 10/26/2020.  His Epworth sleepiness score is 8 out of 24, fatigue severity score is 28 out of 63.  His girlfriend has noted apneas while he is asleep.  He lives with his girlfriend, no children, 3 cats in the household.  He does fall asleep with the TV on, he has it on a timer typically.  He goes to bed typically around 9 and rise time is generally between 5 and 5:30 AM.  He works as a Estate manager/land agent.  He has nocturia about once or twice per average night and has had the occasional morning headache.  He is not aware of any family history of sleep apnea.  He has been working out on a regular basis but has had weight gain as well.  He does not smoke cigarettes but uses tobacco dip, 1 can/day on average.  He drinks alcohol infrequently, typically 1 drink on a Saturday.  He drinks caffeine in the form of soda, 1 glass/day on average.  He has noticed fluid retention and lower extremity edema.  His Past Medical History Is Significant For: Past Medical History:  Diagnosis Date  . Chicken pox   . Depression   . Fatty liver   . GERD (gastroesophageal  reflux disease)   . Migraines   . Osteoarthritis of shoulder    left  . Rotator cuff impingement syndrome of left shoulder 06/19/2015  . Sinusitis     His Past Surgical History Is Significant For: Past Surgical History:  Procedure Laterality Date  . APPENDECTOMY    . CERVICAL LAMINECTOMY     has plates and fusion  . CHOLECYSTECTOMY N/A 12/24/2015   Procedure: LAPAROSCOPIC CHOLECYSTECTOMY ;  Surgeon: Joshua Pickerel, MD;  Location: [facility];  Service: General;  Laterality: N/A;  . CLAVICLE EXCISION Left   . HYPOSPADIAS CORRECTION     as child  . KNEE SURGERY Bilateral    multiple  . SHOULDER ARTHROSCOPY WITH ROTATOR CUFF REPAIR Left 06/26/2015   Procedure: SHOULDER ARTHROSCOPY WITH ROTATOR CUFF REPAIR;  Surgeon: Joshua Saas, MD;  Location: [facility];  Service: Orthopedics;  Laterality: Left;    His Family History Is Significant For: Family History  Problem Relation Parrish of Onset  . Arthritis Mother   . Ovarian cancer Mother   . Alcohol abuse Father   . Breast cancer Maternal Grandmother   . Cancer Maternal Grandmother        spinal surgery  . Breast cancer Maternal Grandfather   . Cancer Other   . Hypertension Other   . Diabetes Other   . Colon cancer Neg Hx     His Social History Is Significant For: Social  History   Socioeconomic History  . Marital status: Married    Spouse name: Not on file  . Number of children: 1  . Years of education: 94  . Highest education level: Not on file  Occupational History  . Occupation: Paramedic   Tobacco Use  . Smoking status: Never Smoker  . Smokeless tobacco: Current User    Types: Chew  . Tobacco comment: form given 11/29/15  Substance and Sexual Activity  . Alcohol use: Yes    Alcohol/week: 0.0 standard drinks    Comment: occasional  . Drug use: No  . Sexual activity: Yes  Other Topics Concern  . Not on file  Social History Narrative   Fun: Psychologist, sport and exercise, hike   Social Determinants of Health    Financial Resource Strain: Not on file  Food Insecurity: Not on file  Transportation Needs: Not on file  Physical Activity: Not on file  Stress: Not on file  Social Connections: Not on file    His Allergies Are:  Allergies  Allergen Reactions  . Food Diarrhea and Nausea And Vomiting    COCONUT  . Lisinopril Cough  :   His Current Medications Are:  Outpatient Encounter Medications as of 11/28/2020  Medication Sig  . allopurinol (ZYLOPRIM) 300 MG tablet Take 1 tablet (300 mg total) by mouth daily.  Marland Kitchen amLODipine (NORVASC) 10 MG tablet Take 1 tablet (10 mg total) by mouth daily.  Marland Kitchen escitalopram (LEXAPRO) 10 MG tablet Take 1 tablet (10 mg total) by mouth daily.  Marland Kitchen gabapentin (NEURONTIN) 300 MG capsule Take 1 capsule (300 mg total) by mouth 3 (three) times daily as needed.  . methocarbamol (ROBAXIN) 500 MG tablet Take 1 tablet (500 mg total) by mouth at bedtime as needed.  Marland Kitchen omeprazole (PRILOSEC) 20 MG capsule Take 20 mg by mouth daily.  . valsartan-hydrochlorothiazide (DIOVAN HCT) 320-25 MG tablet Take 1 tablet by mouth daily.  . [DISCONTINUED] cephALEXin (KEFLEX) 500 MG capsule Take 1 capsule (500 mg total) by mouth 3 (three) times daily.  . [DISCONTINUED] meloxicam (MOBIC) 15 MG tablet Take 1 tablet (15 mg total) by mouth daily.  . [DISCONTINUED] sulfamethoxazole-trimethoprim (BACTRIM DS) 800-160 MG tablet Take 1 tablet by mouth 2 (two) times daily.   No facility-administered encounter medications on file as of 11/28/2020.  :   Review of Systems:  Out of a complete 14 point review of systems, all are reviewed and negative with the exception of these symptoms as listed below:  Review of Systems  Neurological:       Patient referred for EDS and weight gain. He states he's been told he snores and has occasionally stopped breathing.   Epworth Sleepiness Scale 0= would never doze 1= slight chance of dozing 2= moderate chance of dozing 3= high chance of dozing  Sitting and  reading: 1 Watching TV: 3 Sitting inactive in a public place (ex. Theater or meeting):1 As a passenger in a car for an hour without a break: 0 Lying down to rest in the afternoon: 2 Sitting and talking to someone: 0 Sitting quietly after lunch (no alcohol): 1 In a car, while stopped in traffic: 0 Total: 8     Objective:  Neurological Exam  Physical Exam Physical Examination:   Vitals:   11/28/20 1325  BP: 132/90  Pulse: 87    General Examination: The patient is a very pleasant 45 y.o. male in no acute distress. He appears well-developed and well-nourished and well groomed.   HEENT:  Normocephalic, atraumatic, pupils are equal, round and reactive to light, extraocular tracking is good without limitation to gaze excursion or nystagmus noted. Hearing is grossly intact. Face is symmetric with normal facial animation. Speech is clear with no dysarthria noted. There is no hypophonia. There is no lip, neck/head, jaw or voice tremor. Neck is supple with full range of passive and active motion. There are no carotid bruits on auscultation. Oropharynx exam reveals: mild mouth dryness, good dental hygiene and marked airway crowding, due to larger uvula, tonsils of 2-3+, Mallampati class II. Tongue protrudes centrally and palate elevates symmetrically. Neck size is 21.5 inches. He has a no significant overbite.   Chest: Clear to auscultation without wheezing, rhonchi or crackles noted.  Heart: S1+S2+0, regular and normal without murmurs, rubs or gallops noted.   Abdomen: Soft, non-tender and non-distended with normal bowel sounds appreciated on auscultation.  Extremities: There is trace pitting edema in the distal lower extremities bilaterally.   Skin: Warm and dry without trophic changes noted.   Musculoskeletal: exam reveals no obvious joint deformities, tenderness or joint swelling or erythema.   Neurologically:  Mental status: The patient is awake, alert and oriented in all 4 spheres.  His immediate and remote memory, attention, language skills and fund of knowledge are appropriate. There is no evidence of aphasia, agnosia, apraxia or anomia. Speech is clear with normal prosody and enunciation. Thought process is linear. Mood is normal and affect is normal.  Cranial nerves II - XII are as described above under HEENT exam.  Motor exam: Normal bulk, strength and tone is noted. There is no tremor, Romberg is negative. Fine motor skills and coordination: grossly intact.  Cerebellar testing: No dysmetria or intention tremor. There is no truncal or gait ataxia.  Sensory exam: intact to light touch in the upper and lower extremities.  Gait, station and balance: He stands easily. No veering to one side is noted. No leaning to one side is noted. Posture is Parrish-appropriate and stance is narrow based. Gait shows normal stride length and normal pace. No problems turning are noted. Tandem walk is unremarkable.                Assessment and plan:  In summary, Joshua Parrish is a very pleasant 46 y.o.-year old male with an underlying medical history of gout, reflux disease, migraine headaches, osteoarthritis,, status post multiple surgeries including several knee surgeries, shoulder surgeries and neck surgery.  Sinusitis, hypertension, and severe obesity with a BMI of over 45, whose history and physical exam are concerning for obstructive sleep apnea (OSA). I had a long chat with the patient about my findings and the diagnosis of OSA, its prognosis and treatment options. We talked about medical treatments, surgical interventions and non-pharmacological approaches. I explained in particular the risks and ramifications of untreated moderate to severe OSA, especially with respect to developing cardiovascular disease down the Road, including congestive heart failure, difficult to treat hypertension, cardiac arrhythmias, or stroke. Even type 2 diabetes has, in part, been linked to untreated OSA. Symptoms of  untreated OSA include daytime sleepiness, memory problems, mood irritability and mood disorder such as depression and anxiety, lack of energy, as well as recurrent headaches, especially morning headaches. We talked about trying to maintain a healthy lifestyle in general, as well as the importance of weight control. We also talked about the importance of good sleep hygiene. I recommended the following at this time: sleep study.   I explained the sleep test procedure to the  patient and also outlined possible surgical and non-surgical treatment options of OSA, including the use of a custom-made dental device (which would require a referral to a specialist dentist or oral surgeon), upper airway surgical options, such as traditional UPPP or a novel less invasive surgical option in the form of Inspire hypoglossal nerve stimulation (which would involve a referral to an ENT surgeon). I also explained the CPAP treatment option to the patient, who indicated that he would be willing to try CPAP if the need arises. I explained the importance of being compliant with PAP treatment, not only for insurance purposes but primarily to improve His symptoms, and for the patient's long term health benefit, including to reduce His cardiovascular risks. I answered all his questions today and the patient was in agreement. I plan to see him back after the sleep study is completed and encouraged him to call with any interim questions, concerns, problems or updates.   Thank you very much for allowing me to participate in the care of this nice patient. If I can be of any further assistance to you please do not hesitate to call me at [phone].  Sincerely,   Joshua Age, MD, PhD

## 2020-11-30 ENCOUNTER — Ambulatory Visit: Payer: 59 | Admitting: Family

## 2020-12-08 ENCOUNTER — Other Ambulatory Visit: Payer: Self-pay | Admitting: Family

## 2020-12-10 ENCOUNTER — Encounter: Payer: Self-pay | Admitting: Family

## 2020-12-17 ENCOUNTER — Encounter: Payer: Self-pay | Admitting: Family

## 2020-12-17 ENCOUNTER — Ambulatory Visit: Payer: 59 | Admitting: Family

## 2020-12-17 ENCOUNTER — Other Ambulatory Visit: Payer: Self-pay

## 2020-12-17 ENCOUNTER — Ambulatory Visit (INDEPENDENT_AMBULATORY_CARE_PROVIDER_SITE_OTHER): Payer: 59 | Admitting: Neurology

## 2020-12-17 VITALS — BP 144/100 | HR 97 | Temp 98.4°F | Ht 68.0 in | Wt 303.8 lb

## 2020-12-17 DIAGNOSIS — G4719 Other hypersomnia: Secondary | ICD-10-CM

## 2020-12-17 DIAGNOSIS — L989 Disorder of the skin and subcutaneous tissue, unspecified: Secondary | ICD-10-CM

## 2020-12-17 DIAGNOSIS — R635 Abnormal weight gain: Secondary | ICD-10-CM

## 2020-12-17 DIAGNOSIS — M1A9XX0 Chronic gout, unspecified, without tophus (tophi): Secondary | ICD-10-CM

## 2020-12-17 DIAGNOSIS — G4733 Obstructive sleep apnea (adult) (pediatric): Secondary | ICD-10-CM | POA: Diagnosis not present

## 2020-12-17 DIAGNOSIS — I10 Essential (primary) hypertension: Secondary | ICD-10-CM | POA: Diagnosis not present

## 2020-12-17 DIAGNOSIS — R0683 Snoring: Secondary | ICD-10-CM

## 2020-12-17 DIAGNOSIS — R519 Headache, unspecified: Secondary | ICD-10-CM

## 2020-12-17 DIAGNOSIS — R351 Nocturia: Secondary | ICD-10-CM

## 2020-12-17 DIAGNOSIS — Z9189 Other specified personal risk factors, not elsewhere classified: Secondary | ICD-10-CM

## 2020-12-17 DIAGNOSIS — R0681 Apnea, not elsewhere classified: Secondary | ICD-10-CM

## 2020-12-17 MED ORDER — TRIAMCINOLONE ACETONIDE 0.1 % EX CREA: 1.0000 "application " | TOPICAL_CREAM | Freq: Two times a day (BID) | CUTANEOUS | 0 refills | Status: DC

## 2020-12-17 MED ORDER — SPIRONOLACTONE 25 MG PO TABS: 25.0000 mg | ORAL_TABLET | Freq: Every day | ORAL | 0 refills | Status: DC

## 2020-12-17 NOTE — Progress Notes (Signed)
Joshua Parrish is a 45 y.o. male with the following history as recorded in EpicCare:  Patient Active Problem List   Diagnosis Date Noted  . Viral URI with cough 09/06/2018  . Rotator cuff disorder, left 12/25/2017  . Bilateral shoulder pain 05/22/2017  . Left knee pain 05/22/2017  . Urinary frequency 02/09/2017  . Essential hypertension 01/16/2017  . Prostatitis 12/25/2016  . Sepsis ([place]) 12/25/2016  . GERD (gastroesophageal reflux disease) 12/25/2016  . Sinusitis 12/09/2016  . Tendinitis of right rotator cuff 12/09/2016  . Laryngopharyngeal reflux (LPR) 02/04/2016  . Mass in neck 11/27/2015  . Rotator cuff impingement syndrome of left shoulder 06/19/2015  . Acromioclavicular joint arthritis 06/19/2015    Current Outpatient Medications  Medication Sig Dispense Refill  . allopurinol (ZYLOPRIM) 300 MG tablet Take 1 tablet (300 mg total) by mouth daily. 90 tablet 0  . amLODipine (NORVASC) 10 MG tablet Take 1 tablet (10 mg total) by mouth daily. 90 tablet 0  . escitalopram (LEXAPRO) 10 MG tablet Take 1 tablet (10 mg total) by mouth daily. 90 tablet 1  . gabapentin (NEURONTIN) 300 MG capsule Take 1 capsule (300 mg total) by mouth 3 (three) times daily as needed. 90 capsule 1  . methocarbamol (ROBAXIN) 500 MG tablet Take 1 tablet (500 mg total) by mouth at bedtime as needed. 30 tablet 0  . omeprazole (PRILOSEC) 20 MG capsule Take 20 mg by mouth daily.    Marland Kitchen spironolactone (ALDACTONE) 25 MG tablet Take 1 tablet (25 mg total) by mouth daily. 90 tablet 0  . triamcinolone cream (KENALOG) 0.1 % Apply 1 application topically 2 (two) times daily. 30 g 0  . valsartan-hydrochlorothiazide (DIOVAN HCT) 320-25 MG tablet Take 1 tablet by mouth daily. 90 tablet 0   No current facility-administered medications for this visit.    Allergies: Food and Lisinopril  Past Medical History:  Diagnosis Date  . Chicken pox   . Depression   . Fatty liver   . GERD (gastroesophageal reflux disease)   . Migraines    . Osteoarthritis of shoulder    left  . Rotator cuff impingement syndrome of left shoulder 06/19/2015  . Sinusitis     Past Surgical History:  Procedure Laterality Date  . APPENDECTOMY    . CERVICAL LAMINECTOMY     has plates and fusion  . CHOLECYSTECTOMY N/A 12/24/2015   Procedure: LAPAROSCOPIC CHOLECYSTECTOMY ;  Surgeon: [name] Pickerel, MD;  Location: [facility];  Service: General;  Laterality: N/A;  . CLAVICLE EXCISION Left   . HYPOSPADIAS CORRECTION     as child  . KNEE SURGERY Bilateral    multiple  . SHOULDER ARTHROSCOPY WITH ROTATOR CUFF REPAIR Left 06/26/2015   Procedure: SHOULDER ARTHROSCOPY WITH ROTATOR CUFF REPAIR;  Surgeon: Elsie Saas, MD;  Location: [facility];  Service: Orthopedics;  Laterality: Left;    Family History  Problem Relation Age of Onset  . Arthritis Mother   . Ovarian cancer Mother   . Alcohol abuse Father   . Breast cancer Maternal Grandmother   . Cancer Maternal Grandmother        spinal surgery  . Breast cancer Maternal Grandfather   . Cancer Other   . Hypertension Other   . Diabetes Other   . Colon cancer Neg Hx     Social History   Tobacco Use  . Smoking status: Never Smoker  . Smokeless tobacco: Current User    Types: Chew  . Tobacco comment: form given 11/29/15  Substance  Use Topics  . Alcohol use: Yes    Alcohol/week: 0.0 standard drinks    Comment: occasional    Subjective:  1 month follow up on hypertension- cough resolved within 3 days of stopping Lisinopril; currently on Diovan HCT and Amlodipine;  Admits he is nervous about pending sleep schedule tonight;  Objective:  Vitals:   12/17/20 1559  BP: (!) 144/100  Pulse: 97  Temp: 98.4 F (36.9 C)  TempSrc: Oral  SpO2: 97%  Weight: (!) 303 lb 12.8 oz (137.8 kg)  Height: 5\' 8"  (1.727 m)    General: Well developed, well nourished, in no acute distress  Skin : Warm and dry. Dry, scaly lesion noted over right upper shoulder Head: Normocephalic and atraumatic   Eyes: Sclera and conjunctiva clear; pupils round and reactive to light; extraocular movements intact  Ears: External normal; canals clear; tympanic membranes normal  Oropharynx: Pink, supple. No suspicious lesions  Neck: Supple without thyromegaly, adenopathy  Lungs: Respirations unlabored; clear to auscultation bilaterally without wheeze, rales, rhonchi  CVS exam: normal rate and regular rhythm.  Neurologic: Alert and oriented; speech intact; face symmetrical; moves all extremities well; CNII-XII intact without focal deficit   Assessment:  1. Essential hypertension   2. Skin lesion   3. Chronic gout without tophus, unspecified cause, unspecified site     Plan:  1. Continue same medications; add Spironolactone 25 mg daily; re-check in 1 month; will need labs at that time; 2. Trial of Triamcinolone cream apply bid to affected area; refer to dermatologist if area persists; 3. Need to check uric acid level at next OV;  This visit occurred during the SARS-CoV-2 public health emergency.  Safety protocols were in place, including screening questions prior to the visit, additional usage of staff PPE, and extensive cleaning of exam room while observing appropriate contact time as indicated for disinfecting solutions.     Return in about 1 month (around 01/16/2021).  Orders Placed This Encounter  Procedures  . Ambulatory referral to Dermatology    Referral Priority:   Routine    Referral Type:   Consultation    Referral Reason:   Specialty Services Required    Requested Specialty:   Dermatology    Number of Visits Requested:   1    Requested Prescriptions   Signed Prescriptions Disp Refills  . spironolactone (ALDACTONE) 25 MG tablet 90 tablet 0    Sig: Take 1 tablet (25 mg total) by mouth daily.  Marland Kitchen triamcinolone cream (KENALOG) 0.1 % 30 g 0    Sig: Apply 1 application topically 2 (two) times daily.

## 2020-12-18 NOTE — Progress Notes (Signed)
See procedure note.

## 2020-12-18 NOTE — Addendum Note (Signed)
Addended by: Star Age on: 12/18/2020 06:10 PM   Modules accepted: Orders

## 2020-12-18 NOTE — Procedures (Signed)
[facility]  HOME SLEEP TEST (Watch PAT)  STUDY DATE: 12/17/20  DOB: 1976/01/27  MRN: [id number]  ORDERING CLINICIAN: Star Age, MD, PhD   REFERRING CLINICIAN: Marrian Salvage, FNP   CLINICAL INFORMATION/HISTORY: Mr. Codd is a 45 year old right-handed gentleman with an underlying medical history of gout, reflux disease, migraine headaches, osteoarthritis,, status post multiple surgeries including several knee surgeries, shoulder surgeries and neck surgery.  Sinusitis, hypertension, and severe obesity with a BMI of over 45, who reports snoring and excessive daytime somnolence.  Epworth sleepiness score: 8/24.  BMI: 45.4 kg/m  Neck Circumference: 21.5"  FINDINGS:   Total Record Time (hours, min): 9 H 51 min  Total Sleep Time (hours, min):  8 H 46 min   Percent REM (%):    22.06 %   Calculated pAHI (per hour): 40.1       REM pAHI: 43.7    NREM pAHI: 39.0 Supine AHI: 83.9   Oxygen Saturation (%) Mean: 93  Minimum oxygen saturation (%):        73   O2 Saturation Range (%): 73-99  O2Saturation (minutes) <=88%: 27.5 min  Pulse Mean (bpm):    68  Pulse Range (38-131)   IMPRESSION: OSA (obstructive sleep apnea), severe   RECOMMENDATION:  This home sleep test demonstrates severe obstructive sleep apnea with a total AHI of 40.1/hour and O2 nadir of 73%. Moderate to loud snoring was detected. Treatment with positive airway pressure is recommended. The patient will be advised to proceed with an autoPAP titration/trial at home for now. A full night titration study may be considered to optimize treatment settings, if needed down the road. Please note that untreated obstructive sleep apnea may carry additional perioperative morbidity. Patients with significant obstructive sleep apnea should receive perioperative PAP therapy and the surgeons and particularly the anesthesiologist should be informed of the diagnosis and the severity of the sleep disordered  breathing. The patient should be cautioned not to drive, work at heights, or operate dangerous or heavy equipment when tired or sleepy. Review and reiteration of good sleep hygiene measures should be pursued with any patient. Other causes of the patient's symptoms, including circadian rhythm disturbances, an underlying mood disorder, medication effect and/or an underlying medical problem cannot be ruled out based on this test. Clinical correlation is recommended. The patient and his referring provider will be notified of the test results. The patient will be seen in follow up in sleep clinic at [facility].  I certify that I have reviewed the raw data recording prior to the issuance of this report in accordance with the standards of the American Academy of Sleep Medicine (AASM).  INTERPRETING PHYSICIAN:  Star Age, MD, PhD  Board Certified in Neurology and Sleep Medicine  [facility] [address] [place], [place] [address] [phone]   Sleep Summary  Oxygen Saturation Statistics   Start Study Time: End Study Time: Total Recording Time:  8:27:42 PM 6:18:44 AM 9 h, 51 min  Total Sleep Time % REM of Sleep Time:  8 h, 46 min  22.0    Mean: 93 Minimum: 73 Maximum: 99  Mean of Desaturations Nadirs (%):   88  Oxygen Desatur. %:  4-9 10-20 >20 Total  Events Number Total   157  106 58.1 39.3  7 2.6  270 100.0  Oxygen Saturation: <90 <=88 <85 <80 <70  Duration (minutes): Sleep % 34.6 6.6 27.5 9.6 5.2 1.8 1.3 0.3 0.0 0.0     Respiratory  Indices      Total Events REM NREM All Night  pRDI: pAHI 3%: ODI 4%: pAHIc 3%: % CSR: pAHI 4%:  369  342 270 76   0.0 303 52.8 43.7 34.7 10.4 40.5 39.0 30.8 8.7 43.2 40.1 31.6 9.1 35.5       Pulse Rate Statistics during Sleep (BPM)      Mean: 68 Minimum: 38 Maximum: 131       Body Position Statistics  Position Supine Prone Right Left Non-Supine  Sleep (min) 96.0 309.5 94.0 27.0 430.5  Sleep %  18.2 58.8 17.9 5.1 81.8  pRDI 84.6 23.3 69.6 33.7 34.2  pAHI 3% 83.9 18.5 68.9 31.5 30.4  ODI 4% 78.7 10.6 55.9 22.5 21.3     Snoring Statistics Snoring Level (dB) >40 >50 >60 >70 >80 >Threshold (45)  Sleep (min) 440.2 34.5 10.0 0.0 0.0 99.1  Sleep % 83.6 6.6 1.9 0.0 0.0 18.8    Mean: 43 dB

## 2020-12-18 NOTE — Progress Notes (Signed)
Patient referred by Jodi Mourning, NP, seen by me on 11/28/20, patient had a HST on 12/17/20.    Please call and notify the patient that the recent home sleep test showed obstructive sleep apnea in the severe range. I recommend treatment in the form of autoPAP, which means, that we don't have to bring him in for a sleep study with CPAP, but will let him start using a so called autoPAP machine at home, which is a CPAP-like machine with self-adjusting pressures. We will send the order to a local DME company (of his choice, or as per insurance requirement). The DME representative will fit him with a mask, educate him on how to use the machine, how to put the mask on, etc. I have placed an order in the chart. Please send the order, talk to patient, send report to referring MD. We will need a FU in sleep clinic for 10 weeks post-PAP set up, please arrange that with me or one of our NPs. Also reinforce the need for compliance with treatment. Thanks,   Star Age, MD, PhD [facility])

## 2021-01-15 ENCOUNTER — Ambulatory Visit: Payer: 59 | Admitting: Family

## 2021-01-15 ENCOUNTER — Other Ambulatory Visit: Payer: Self-pay

## 2021-01-15 ENCOUNTER — Encounter: Payer: Self-pay | Admitting: Family

## 2021-01-15 VITALS — BP 122/88 | HR 108 | Temp 98.6°F | Resp 19 | Ht 68.0 in | Wt 304.0 lb

## 2021-01-15 DIAGNOSIS — Z1211 Encounter for screening for malignant neoplasm of colon: Secondary | ICD-10-CM

## 2021-01-15 DIAGNOSIS — R5383 Other fatigue: Secondary | ICD-10-CM | POA: Diagnosis not present

## 2021-01-15 DIAGNOSIS — Z125 Encounter for screening for malignant neoplasm of prostate: Secondary | ICD-10-CM | POA: Diagnosis not present

## 2021-01-15 DIAGNOSIS — Z86010 Personal history of colonic polyps: Secondary | ICD-10-CM

## 2021-01-15 DIAGNOSIS — Z803 Family history of malignant neoplasm of breast: Secondary | ICD-10-CM

## 2021-01-15 DIAGNOSIS — K625 Hemorrhage of anus and rectum: Secondary | ICD-10-CM

## 2021-01-15 DIAGNOSIS — M109 Gout, unspecified: Secondary | ICD-10-CM | POA: Diagnosis not present

## 2021-01-15 DIAGNOSIS — Z13220 Encounter for screening for lipoid disorders: Secondary | ICD-10-CM | POA: Diagnosis not present

## 2021-01-15 DIAGNOSIS — I10 Essential (primary) hypertension: Secondary | ICD-10-CM | POA: Diagnosis not present

## 2021-01-15 MED ORDER — ESCITALOPRAM OXALATE 20 MG PO TABS: 20.0000 mg | ORAL_TABLET | Freq: Every day | ORAL | 1 refills | Status: DC

## 2021-01-15 NOTE — Progress Notes (Signed)
Joshua Parrish is a 45 y.o. male with the following history as recorded in EpicCare:  Patient Active Problem List   Diagnosis Date Noted  . Viral URI with cough 09/06/2018  . Rotator cuff disorder, left 12/25/2017  . Bilateral shoulder pain 05/22/2017  . Left knee pain 05/22/2017  . Urinary frequency 02/09/2017  . Essential hypertension 01/16/2017  . Prostatitis 12/25/2016  . Sepsis ([place]) 12/25/2016  . GERD (gastroesophageal reflux disease) 12/25/2016  . Sinusitis 12/09/2016  . Tendinitis of right rotator cuff 12/09/2016  . Laryngopharyngeal reflux (LPR) 02/04/2016  . Mass in neck 11/27/2015  . Rotator cuff impingement syndrome of left shoulder 06/19/2015  . Acromioclavicular joint arthritis 06/19/2015    Current Outpatient Medications  Medication Sig Dispense Refill  . allopurinol (ZYLOPRIM) 300 MG tablet Take 1 tablet (300 mg total) by mouth daily. 90 tablet 0  . amLODipine (NORVASC) 10 MG tablet Take 1 tablet (10 mg total) by mouth daily. 90 tablet 0  . escitalopram (LEXAPRO) 20 MG tablet Take 1 tablet (20 mg total) by mouth daily. 90 tablet 1  . gabapentin (NEURONTIN) 300 MG capsule Take 1 capsule (300 mg total) by mouth 3 (three) times daily as needed. 90 capsule 1  . methocarbamol (ROBAXIN) 500 MG tablet Take 1 tablet (500 mg total) by mouth at bedtime as needed. 30 tablet 0  . omeprazole (PRILOSEC) 20 MG capsule Take 20 mg by mouth daily.    Marland Kitchen spironolactone (ALDACTONE) 25 MG tablet Take 1 tablet (25 mg total) by mouth daily. 90 tablet 0  . triamcinolone cream (KENALOG) 0.1 % Apply 1 application topically 2 (two) times daily. 30 g 0  . valsartan-hydrochlorothiazide (DIOVAN HCT) 320-25 MG tablet Take 1 tablet by mouth daily. 90 tablet 0   No current facility-administered medications for this visit.    Allergies: Food and Lisinopril  Past Medical History:  Diagnosis Date  . Chicken pox   . Depression   . Fatty liver   . GERD (gastroesophageal reflux disease)   . Migraines    . Osteoarthritis of shoulder    left  . Rotator cuff impingement syndrome of left shoulder 06/19/2015  . Sinusitis     Past Surgical History:  Procedure Laterality Date  . APPENDECTOMY    . CERVICAL LAMINECTOMY     has plates and fusion  . CHOLECYSTECTOMY N/A 12/24/2015   Procedure: LAPAROSCOPIC CHOLECYSTECTOMY ;  Surgeon: [name] Pickerel, MD;  Location: [facility];  Service: General;  Laterality: N/A;  . CLAVICLE EXCISION Left   . HYPOSPADIAS CORRECTION     as child  . KNEE SURGERY Bilateral    multiple  . SHOULDER ARTHROSCOPY WITH ROTATOR CUFF REPAIR Left 06/26/2015   Procedure: SHOULDER ARTHROSCOPY WITH ROTATOR CUFF REPAIR;  Surgeon: Elsie Saas, MD;  Location: [place];  Service: Orthopedics;  Laterality: Left;    Family History  Problem Relation Age of Onset  . Arthritis Mother   . Ovarian cancer Mother   . Alcohol abuse Father   . Breast cancer Maternal Grandmother   . Cancer Maternal Grandmother        spinal surgery  . Breast cancer Maternal Grandfather   . Cancer Other   . Hypertension Other   . Diabetes Other   . Colon cancer Neg Hx     Social History   Tobacco Use  . Smoking status: Never Smoker  . Smokeless tobacco: Current User    Types: Chew  . Tobacco comment: form given 11/29/15  Substance  Use Topics  . Alcohol use: Yes    Alcohol/week: 0.0 standard drinks    Comment: occasional    Subjective:   Follow up on hypertension- at last OV, Spironolactone was added; pleased with response;  Has been diagnosed with severe sleep apnea since last seen by me but shortage of machines and hoping to hear something about his autopap in the next few days; also feels that his Lexapro is not working as well recently;   Objective:  Vitals:   01/15/21 1517  BP: 122/88  Pulse: (!) 108  Resp: 19  Temp: 98.6 F (37 C)  SpO2: 98%  Weight: (!) 304 lb (137.9 kg)  Height: _0  (1.727 m)    General: Well developed, well nourished, in no acute distress   Skin : Warm and dry.  Head: Normocephalic and atraumatic  Eyes: Sclera and conjunctiva clear; pupils round and reactive to light; extraocular movements intact  Ears: External normal; canals clear; tympanic membranes normal  Oropharynx: Pink, supple. No suspicious lesions  Neck: Supple without thyromegaly, adenopathy  Lungs: Respirations unlabored; clear to auscultation bilaterally without wheeze, rales, rhonchi  CVS exam: normal rate and regular rhythm.  Neurologic: Alert and oriented; speech intact; face symmetrical; moves all extremities well; CNII-XII intact without focal deficit Assessment:  1. Rectal bleeding   2. Encounter for colonoscopy due to history of adenomatous colonic polyps   3. Primary hypertension   4. Prostate cancer screening   5. Gout, unspecified cause, unspecified chronicity, unspecified site   6. Lipid screening   7. Other fatigue   8. FH: breast cancer     Plan:  1. & 2. Refer to GI; was due for colonoscopy in 10/2018- was lost to follow up during [place]; 3. Stable- suspect control will continue to improve once sleep apnea is treated; 4. Check PSA; 5. Check uric acid level; 6. Check lipid panel; 7. Increase Lexapro to 20 mg daily; he will reach out to DME provider if he has not heard anything regarding auto pap within 1 week;  8. Due to [place] of breast cancer in males in his family, will refer for genetic counseling;  This visit occurred during the SARS-CoV-2 public health emergency.  Safety protocols were in place, including screening questions prior to the visit, additional usage of staff PPE, and extensive cleaning of exam room while observing appropriate contact time as indicated for disinfecting solutions.     No follow-ups on file.  Orders Placed This Encounter  Procedures  . CBC with Differential/Platelet  . Comp Met (CMET)  . PSA  . Uric acid  . Lipid panel  . TSH  . Ambulatory referral to Gastroenterology    Referral Priority:   Routine     Referral Type:   Consultation    Referral Reason:   Specialty Services Required    Referred to Provider:   Jerene Bears, MD    Number of Visits Requested:   1  . Ambulatory referral to Genetics    Referral Priority:   Routine    Referral Type:   Consultation    Referral Reason:   Specialty Services Required    Number of Visits Requested:   1    Requested Prescriptions   Signed Prescriptions Disp Refills  . escitalopram (LEXAPRO) 20 MG tablet 90 tablet 1    Sig: Take 1 tablet (20 mg total) by mouth daily.

## 2021-01-16 LAB — COMPREHENSIVE METABOLIC PANEL
ALT: 29 U/L (ref 0–53)
AST: 19 U/L (ref 0–37)
Albumin: 4.7 g/dL (ref 3.5–5.2)
Alkaline Phosphatase: 57 U/L (ref 39–117)
BUN: 15 mg/dL (ref 6–23)
CO2: 29 mEq/L (ref 19–32)
Calcium: 9.8 mg/dL (ref 8.4–10.5)
Chloride: 98 mEq/L (ref 96–112)
Creatinine, Ser: 1 mg/dL (ref 0.40–1.50)
GFR: 90.76 mL/min (ref >60.00)
Glucose, Bld: 106 mg/dL — ABNORMAL HIGH (ref 70–99)
Potassium: 4.5 mEq/L (ref 3.5–5.1)
Sodium: 138 mEq/L (ref 135–145)
Total Bilirubin: 0.4 mg/dL (ref 0.2–1.2)
Total Protein: 7.4 g/dL (ref 6.0–8.3)

## 2021-01-16 LAB — CBC WITH DIFFERENTIAL/PLATELET
Basophils Absolute: 0 10*3/uL (ref 0.0–0.1)
Basophils Relative: 0.6 % (ref 0.0–3.0)
Eosinophils Absolute: 0.1 10*3/uL (ref 0.0–0.7)
Eosinophils Relative: 0.9 % (ref 0.0–5.0)
HCT: 40.5 % (ref 39.0–52.0)
Hemoglobin: 13.6 g/dL (ref 13.0–17.0)
Lymphocytes Relative: 27.6 % (ref 12.0–46.0)
Lymphs Abs: 2.1 10*3/uL (ref 0.7–4.0)
MCHC: 33.7 g/dL (ref 30.0–36.0)
MCV: 89.2 fl (ref 78.0–100.0)
Monocytes Absolute: 0.5 10*3/uL (ref 0.1–1.0)
Monocytes Relative: 6 % (ref 3.0–12.0)
Neutro Abs: 4.9 10*3/uL (ref 1.4–7.7)
Neutrophils Relative %: 64.9 % (ref 43.0–77.0)
Platelets: 324 10*3/uL (ref 150.0–400.0)
RBC: 4.54 Mil/uL (ref 4.22–5.81)
RDW: 14.2 % (ref 11.5–15.5)
WBC: 7.5 10*3/uL (ref 4.0–10.5)

## 2021-01-16 LAB — LIPID PANEL
Cholesterol: 246 mg/dL — ABNORMAL HIGH (ref 0–200)
HDL: 37.3 mg/dL — ABNORMAL LOW (ref >39.00)
Total CHOL/HDL Ratio: 7
Triglycerides: 466 mg/dL — ABNORMAL HIGH (ref 0.0–149.0)

## 2021-01-16 LAB — PSA: PSA: 0.49 ng/mL (ref 0.10–4.00)

## 2021-01-16 LAB — TSH: TSH: 2.3 u[IU]/mL (ref 0.35–4.50)

## 2021-01-16 LAB — LDL CHOLESTEROL, DIRECT: Direct LDL: 141 mg/dL

## 2021-01-16 LAB — URIC ACID: Uric Acid, Serum: 6.3 mg/dL (ref 4.0–7.8)

## 2021-01-28 ENCOUNTER — Other Ambulatory Visit: Payer: Self-pay | Admitting: Family

## 2021-02-04 ENCOUNTER — Other Ambulatory Visit: Payer: Self-pay | Admitting: Family

## 2021-02-06 ENCOUNTER — Other Ambulatory Visit: Payer: Self-pay | Admitting: Family

## 2021-03-13 ENCOUNTER — Other Ambulatory Visit: Payer: Self-pay | Admitting: Family

## 2021-04-19 ENCOUNTER — Encounter: Payer: Self-pay | Admitting: Family

## 2021-04-19 ENCOUNTER — Other Ambulatory Visit: Payer: Self-pay

## 2021-04-19 ENCOUNTER — Ambulatory Visit (INDEPENDENT_AMBULATORY_CARE_PROVIDER_SITE_OTHER): Payer: 59 | Admitting: Family

## 2021-04-19 VITALS — BP 134/88 | HR 87 | Temp 98.0°F | Ht 68.0 in | Wt 308.4 lb

## 2021-04-19 DIAGNOSIS — F339 Major depressive disorder, recurrent, unspecified: Secondary | ICD-10-CM | POA: Diagnosis not present

## 2021-04-19 DIAGNOSIS — E782 Mixed hyperlipidemia: Secondary | ICD-10-CM | POA: Diagnosis not present

## 2021-04-19 DIAGNOSIS — R7309 Other abnormal glucose: Secondary | ICD-10-CM

## 2021-04-19 LAB — COMPREHENSIVE METABOLIC PANEL
ALT: 31 U/L (ref 0–53)
AST: 18 U/L (ref 0–37)
Albumin: 4.6 g/dL (ref 3.5–5.2)
Alkaline Phosphatase: 66 U/L (ref 39–117)
BUN: 15 mg/dL (ref 6–23)
CO2: 29 mEq/L (ref 19–32)
Calcium: 9.6 mg/dL (ref 8.4–10.5)
Chloride: 100 mEq/L (ref 96–112)
Creatinine, Ser: 0.98 mg/dL (ref 0.40–1.50)
GFR: 92.82 mL/min (ref >60.00)
Glucose, Bld: 92 mg/dL (ref 70–99)
Potassium: 4.4 mEq/L (ref 3.5–5.1)
Sodium: 137 mEq/L (ref 135–145)
Total Bilirubin: 0.4 mg/dL (ref 0.2–1.2)
Total Protein: 7.2 g/dL (ref 6.0–8.3)

## 2021-04-19 LAB — LDL CHOLESTEROL, DIRECT: Direct LDL: 120 mg/dL

## 2021-04-19 LAB — LIPID PANEL
Cholesterol: 229 mg/dL — ABNORMAL HIGH (ref 0–200)
HDL: 34.3 mg/dL — ABNORMAL LOW (ref >39.00)
NonHDL: 194.4
Total CHOL/HDL Ratio: 7
Triglycerides: 359 mg/dL — ABNORMAL HIGH (ref 0.0–149.0)
VLDL: 71.8 mg/dL — ABNORMAL HIGH (ref 0.0–40.0)

## 2021-04-19 LAB — HEMOGLOBIN A1C: Hgb A1c MFr Bld: 5.5 % (ref 4.6–6.5)

## 2021-04-19 MED ORDER — BUPROPION HCL ER (XL) 150 MG PO TB24: 150.0000 mg | ORAL_TABLET | Freq: Every day | ORAL | 0 refills | Status: DC

## 2021-04-19 NOTE — Progress Notes (Signed)
Joshua Parrish is a 45 y.o. male with the following history as recorded in EpicCare:  Patient Active Problem List   Diagnosis Date Noted   Viral URI with cough 09/06/2018   Rotator cuff disorder, left 12/25/2017   Bilateral shoulder pain 05/22/2017   Left knee pain 05/22/2017   Urinary frequency 02/09/2017   Essential hypertension 01/16/2017   Prostatitis 12/25/2016   Sepsis ([place]) 12/25/2016   GERD (gastroesophageal reflux disease) 12/25/2016   Sinusitis 12/09/2016   Tendinitis of right rotator cuff 12/09/2016   Laryngopharyngeal reflux (LPR) 02/04/2016   Mass in neck 11/27/2015   Rotator cuff impingement syndrome of left shoulder 06/19/2015   Acromioclavicular joint arthritis 06/19/2015    Current Outpatient Medications  Medication Sig Dispense Refill   allopurinol (ZYLOPRIM) 300 MG tablet TAKE ONE TABLET BY MOUTH DAILY 30 tablet 3   amLODipine (NORVASC) 10 MG tablet TAKE ONE TABLET BY MOUTH DAILY 30 tablet 2   buPROPion (WELLBUTRIN XL) 150 MG 24 hr tablet Take 1 tablet (150 mg total) by mouth daily. 90 tablet 0   escitalopram (LEXAPRO) 20 MG tablet Take 1 tablet (20 mg total) by mouth daily. 90 tablet 1   gabapentin (NEURONTIN) 300 MG capsule Take 1 capsule (300 mg total) by mouth 3 (three) times daily as needed. 90 capsule 1   omeprazole (PRILOSEC) 20 MG capsule Take 20 mg by mouth daily.     spironolactone (ALDACTONE) 25 MG tablet TAKE ONE TABLET BY MOUTH DAILY 30 tablet 2   triamcinolone cream (KENALOG) 0.1 % Apply 1 application topically 2 (two) times daily. 30 g 0   valsartan-hydrochlorothiazide (DIOVAN-HCT) 320-25 MG tablet TAKE ONE TABLET BY MOUTH DAILY 30 tablet 3   methocarbamol (ROBAXIN) 500 MG tablet Take 1 tablet (500 mg total) by mouth at bedtime as needed. (Patient not taking: Reported on 04/19/2021) 30 tablet 0   No current facility-administered medications for this visit.    Allergies: Food and Lisinopril  Past Medical History:  Diagnosis Date   Chicken pox     Depression    Fatty liver    GERD (gastroesophageal reflux disease)    Migraines    Osteoarthritis of shoulder    left   Rotator cuff impingement syndrome of left shoulder 06/19/2015   Sinusitis     Past Surgical History:  Procedure Laterality Date   APPENDECTOMY     CERVICAL LAMINECTOMY     has plates and fusion   CHOLECYSTECTOMY N/A 12/24/2015   Procedure: LAPAROSCOPIC CHOLECYSTECTOMY ;  Surgeon: [name] Pickerel, MD;  Location: [facility];  Service: General;  Laterality: N/A;   CLAVICLE EXCISION Left    HYPOSPADIAS CORRECTION     as child   KNEE SURGERY Bilateral    multiple   SHOULDER ARTHROSCOPY WITH ROTATOR CUFF REPAIR Left 06/26/2015   Procedure: SHOULDER ARTHROSCOPY WITH ROTATOR CUFF REPAIR;  Surgeon: Elsie Saas, MD;  Location: [place];  Service: Orthopedics;  Laterality: Left;    Family History  Problem Relation Age of Onset   Arthritis Mother    Ovarian cancer Mother    Alcohol abuse Father    Breast cancer Maternal Grandmother    Cancer Maternal Grandmother        spinal surgery   Breast cancer Maternal Grandfather    Cancer Other    Hypertension Other    Diabetes Other    Colon cancer Neg Hx     Social History   Tobacco Use   Smoking status: Never   Smokeless  tobacco: Current    Types: Chew   Tobacco comments:    form given 11/29/15  Substance Use Topics   Alcohol use: Yes    Alcohol/week: 0.0 standard drinks    Comment: occasional    Subjective:   3 month follow up on elevated triglycerides/ elevated glucose; Notes that depression seems worse lately- "feel the weight of the world on my shoulders." Upset that has not been able to see son since 2019; has worked with counselor in the past but did not find it beneficial; is not actively suicidal;   Has been able to get CPAP/ auto pap and does feel that sleep has improved; working out and can see that he has lost inches- "clothes are falling off of me." Physically feeling better;      Objective:  Vitals:   04/19/21 0826  BP: 134/88  Pulse: 87  Temp: 98 F (36.7 C)  TempSrc: Oral  SpO2: 98%  Weight: (!) 308 lb 6.4 oz (139.9 kg)  Height: _0  (1.727 m)    General: Well developed, well nourished, in no acute distress  Skin : Warm and dry.  Head: Normocephalic and atraumatic  Eyes: Sclera and conjunctiva clear; pupils round and reactive to light; extraocular movements intact  Ears: External normal; canals clear; tympanic membranes normal  Oropharynx: Pink, supple. No suspicious lesions  Neck: Supple without thyromegaly, adenopathy  Lungs: Respirations unlabored; clear to auscultation bilaterally without wheeze, rales, rhonchi  CVS exam: normal rate and regular rhythm.  Neurologic: Alert and oriented; speech intact; face symmetrical; moves all extremities well; CNII-XII intact without focal deficit   Assessment:  1. Elevated glucose   2. Elevated triglycerides with high cholesterol   3. Depression, recurrent ([place])     Plan:  Check CMP, hgba1c today; Check lipid panel; to consider starting medication; Try adding Wellbutrin XL 150 mg daily; he will reach out to his employer to see if EAP is option; follow up by phone in 2 weeks;  This visit occurred during the SARS-CoV-2 public health emergency.  Safety protocols were in place, including screening questions prior to the visit, additional usage of staff PPE, and extensive cleaning of exam room while observing appropriate contact time as indicated for disinfecting solutions.    No follow-ups on file.  Orders Placed This Encounter  Procedures   Lipid panel   Comp Met (CMET)   Hemoglobin A1c    Requested Prescriptions   Signed Prescriptions Disp Refills   buPROPion (WELLBUTRIN XL) 150 MG 24 hr tablet 90 tablet 0    Sig: Take 1 tablet (150 mg total) by mouth daily.

## 2021-04-19 NOTE — Patient Instructions (Signed)
Please reach out to your HR to discuss about EAP options; let me know if you don't have this option;

## 2021-05-05 ENCOUNTER — Other Ambulatory Visit: Payer: Self-pay | Admitting: Family

## 2021-06-06 ENCOUNTER — Other Ambulatory Visit: Payer: Self-pay | Admitting: Family

## 2021-06-15 ENCOUNTER — Other Ambulatory Visit: Payer: Self-pay | Admitting: Family

## 2021-07-09 ENCOUNTER — Encounter: Payer: Self-pay | Admitting: Family

## 2021-07-14 ENCOUNTER — Other Ambulatory Visit: Payer: Self-pay | Admitting: Family

## 2021-07-16 ENCOUNTER — Ambulatory Visit: Payer: 59 | Admitting: Family

## 2021-08-07 ENCOUNTER — Other Ambulatory Visit: Payer: Self-pay | Admitting: Family

## 2021-08-28 ENCOUNTER — Telehealth: Payer: 59 | Admitting: Physician Assistant

## 2021-08-28 DIAGNOSIS — J069 Acute upper respiratory infection, unspecified: Secondary | ICD-10-CM | POA: Diagnosis not present

## 2021-08-28 MED ORDER — BENZONATATE 100 MG PO CAPS: 100.0000 mg | ORAL_CAPSULE | Freq: Three times a day (TID) | ORAL | 0 refills | Status: DC | PRN

## 2021-08-28 MED ORDER — IPRATROPIUM BROMIDE 0.03 % NA SOLN: 2.0000 | Freq: Two times a day (BID) | NASAL | 0 refills | Status: DC

## 2021-08-28 NOTE — Progress Notes (Signed)

## 2021-09-20 ENCOUNTER — Encounter: Payer: Self-pay | Admitting: Family

## 2021-09-20 ENCOUNTER — Ambulatory Visit: Payer: 59 | Admitting: Family

## 2021-09-20 VITALS — BP 140/100 | HR 105 | Temp 98.8°F | Ht 68.0 in | Wt 321.2 lb

## 2021-09-20 DIAGNOSIS — R7989 Other specified abnormal findings of blood chemistry: Secondary | ICD-10-CM | POA: Diagnosis not present

## 2021-09-20 DIAGNOSIS — F339 Major depressive disorder, recurrent, unspecified: Secondary | ICD-10-CM | POA: Diagnosis not present

## 2021-09-20 DIAGNOSIS — R1310 Dysphagia, unspecified: Secondary | ICD-10-CM

## 2021-09-20 DIAGNOSIS — L309 Dermatitis, unspecified: Secondary | ICD-10-CM

## 2021-09-20 DIAGNOSIS — I10 Essential (primary) hypertension: Secondary | ICD-10-CM

## 2021-09-20 MED ORDER — NYSTATIN-TRIAMCINOLONE 100000-0.1 UNIT/GM-% EX CREA: 1.0000 "application " | TOPICAL_CREAM | Freq: Two times a day (BID) | CUTANEOUS | 0 refills | Status: DC

## 2021-09-20 MED ORDER — OMEPRAZOLE 40 MG PO CPDR: 40.0000 mg | DELAYED_RELEASE_CAPSULE | Freq: Every day | ORAL | 3 refills | Status: DC

## 2021-09-20 NOTE — Patient Instructions (Signed)
You don't need a referral to make an appointment with new psychiatric group. I would recommend that you call [facility] at [phone]; I will also put in the referral to [facility] for you. See which group can see you first and please follow up there.

## 2021-09-20 NOTE — Progress Notes (Signed)
Joshua Parrish is a 46 y.o. male with the following history as recorded in EpicCare:  Patient Active Problem List   Diagnosis Date Noted   Viral URI with cough 09/06/2018   Rotator cuff disorder, left 12/25/2017   Bilateral shoulder pain 05/22/2017   Left knee pain 05/22/2017   Urinary frequency 02/09/2017   Essential hypertension 01/16/2017   Prostatitis 12/25/2016   Sepsis ([place]) 12/25/2016   GERD (gastroesophageal reflux disease) 12/25/2016   Sinusitis 12/09/2016   Tendinitis of right rotator cuff 12/09/2016   Laryngopharyngeal reflux (LPR) 02/04/2016   Mass in neck 11/27/2015   Rotator cuff impingement syndrome of left shoulder 06/19/2015   Acromioclavicular joint arthritis 06/19/2015    Current Outpatient Medications  Medication Sig Dispense Refill   allopurinol (ZYLOPRIM) 300 MG tablet TAKE ONE TABLET BY MOUTH DAILY 30 tablet 3   amLODipine (NORVASC) 10 MG tablet TAKE ONE TABLET BY MOUTH DAILY 30 tablet 2   buPROPion (WELLBUTRIN XL) 150 MG 24 hr tablet TAKE ONE TABLET BY MOUTH DAILY 90 tablet 0   escitalopram (LEXAPRO) 20 MG tablet TAKE ONE TABLET BY MOUTH DAILY 90 tablet 1   gabapentin (NEURONTIN) 300 MG capsule Take 1 capsule (300 mg total) by mouth 3 (three) times daily as needed. 90 capsule 1   ipratropium (ATROVENT) 0.03 % nasal spray Place 2 sprays into both nostrils every 12 (twelve) hours. 30 mL 0   nystatin-triamcinolone (MYCOLOG II) cream Apply 1 application topically 2 (two) times daily. 30 g 0   spironolactone (ALDACTONE) 25 MG tablet TAKE ONE TABLET BY MOUTH DAILY 30 tablet 2   triamcinolone cream (KENALOG) 0.1 % Apply 1 application topically 2 (two) times daily. 30 g 0   valsartan-hydrochlorothiazide (DIOVAN-HCT) 320-25 MG tablet TAKE ONE TABLET BY MOUTH DAILY 30 tablet 3   methocarbamol (ROBAXIN) 500 MG tablet Take 1 tablet (500 mg total) by mouth at bedtime as needed. (Patient not taking: Reported on 04/19/2021) 30 tablet 0   omeprazole (PRILOSEC) 40 MG capsule Take  1 capsule (40 mg total) by mouth daily. 90 capsule 3   No current facility-administered medications for this visit.    Allergies: Food and Lisinopril  Past Medical History:  Diagnosis Date   Chicken pox    Depression    Fatty liver    GERD (gastroesophageal reflux disease)    Migraines    Osteoarthritis of shoulder    left   Rotator cuff impingement syndrome of left shoulder 06/19/2015   Sinusitis     Past Surgical History:  Procedure Laterality Date   APPENDECTOMY     CERVICAL LAMINECTOMY     has plates and fusion   CHOLECYSTECTOMY N/A 12/24/2015   Procedure: LAPAROSCOPIC CHOLECYSTECTOMY ;  Surgeon: [name] Pickerel, MD;  Location: [facility];  Service: General;  Laterality: N/A;   CLAVICLE EXCISION Left    HYPOSPADIAS CORRECTION     as child   KNEE SURGERY Bilateral    multiple   SHOULDER ARTHROSCOPY WITH ROTATOR CUFF REPAIR Left 06/26/2015   Procedure: SHOULDER ARTHROSCOPY WITH ROTATOR CUFF REPAIR;  Surgeon: Elsie Saas, MD;  Location: [place];  Service: Orthopedics;  Laterality: Left;    Family History  Problem Relation Age of Onset   Arthritis Mother    Ovarian cancer Mother    Alcohol abuse Father    Breast cancer Maternal Grandmother    Cancer Maternal Grandmother        spinal surgery   Breast cancer Maternal Grandfather    Cancer  Other    Hypertension Other    Diabetes Other    Colon cancer Neg Hx     Social History   Tobacco Use   Smoking status: Never   Smokeless tobacco: Current    Types: Chew   Tobacco comments:    form given 11/29/15  Substance Use Topics   Alcohol use: Yes    Alcohol/week: 0.0 standard drinks    Comment: occasional    Subjective:  Continuing to struggle with depression- broken relationship with 73 yo son/ felt like loss was that much harder over the holidays;  Last saw therapist in 2020- did not feel beneficial/ ready to start with new provider;   Had gained 20 pounds in the past few months; working on getting his  weight back down; is taking his blood pressure medication;   Nipple itching/ irritation x 5-6 months; FH of breast cancer; would like to discuss getting mammogram;      Objective:  Vitals:   09/20/21 1501 09/20/21 1512  BP: (!) 146/70 (!) 140/100  Pulse: (!) 105   Temp: 98.8 F (37.1 C)   TempSrc: Oral   SpO2: 96%   Weight: (!) 321 lb 3.2 oz (145.7 kg)   Height: _0  (1.727 m)     General: Well developed, well nourished, in no acute distress  Skin : Warm and dry.  Head: Normocephalic and atraumatic  Eyes: Sclera and conjunctiva clear; pupils round and reactive to light; extraocular movements intact  Ears: External normal; canals clear; tympanic membranes normal  Oropharynx: Pink, supple. No suspicious lesions  Neck: Supple without thyromegaly, adenopathy  Lungs: Respirations unlabored; clear to auscultation bilaterally without wheeze, rales, rhonchi  CVS exam: normal rate and regular rhythm.  Neurologic: Alert and oriented; speech intact; face symmetrical; moves all extremities well; CNII-XII intact without focal deficit   Assessment:  1. Depression, recurrent ([place])   2. Dysphagia, unspecified type   3. Nipple dermatitis   4. Dermatitis   5. Low testosterone level in male     Plan:  Will refer to psychiatrist and pyschologist; feel that therapy will be most beneficial for him; will leave medications at this time;  Refer to GI- ? Need for endoscopy; due for baseline colonoscopy; increase Omeprazole to 40 mg daily; ? Yeast; Rx for Mycolog II; update mammogram due to family history of breast cancer; Refer to dermatology per patient request; Check testosterone level;  Keep working on weight loss goals- will hold on adding more medication at this time.  This visit occurred during the SARS-CoV-2 public health emergency.  Safety protocols were in place, including screening questions prior to the visit, additional usage of staff PPE, and extensive cleaning of exam room while  observing appropriate contact time as indicated for disinfecting solutions.    No follow-ups on file.  Orders Placed This Encounter  Procedures   MM Digital Screening    Standing Status:   Future    Standing Expiration Date:   09/20/2022    Order Specific Question:   Reason for Exam (SYMPTOM  OR DIAGNOSIS REQUIRED)    Answer:   nipple itching/ FH of breast cancer    Order Specific Question:   Preferred imaging location?    Answer:   [facility]   CBC with Differential/Platelet   Comp Met (CMET)   Hemoglobin A1c   Testosterone Total,Free,Bio, Males-([facility])   Ambulatory referral to Psychiatry    Referral Priority:   Routine    Referral Type:   Psychiatric  Referral Reason:   Specialty Services Required    Requested Specialty:   Psychiatry    Number of Visits Requested:   1   Ambulatory referral to Psychology    Referral Priority:   Routine    Referral Type:   Psychiatric    Referral Reason:   Specialty Services Required    Requested Specialty:   Psychology    Number of Visits Requested:   1   Ambulatory referral to Gastroenterology    Referral Priority:   Routine    Referral Type:   Consultation    Referral Reason:   Specialty Services Required    Number of Visits Requested:   1   Ambulatory referral to Dermatology    Referral Priority:   Routine    Referral Type:   Consultation    Referral Reason:   Specialty Services Required    Requested Specialty:   Dermatology    Number of Visits Requested:   1    Requested Prescriptions   Signed Prescriptions Disp Refills   omeprazole (PRILOSEC) 40 MG capsule 90 capsule 3    Sig: Take 1 capsule (40 mg total) by mouth daily.   nystatin-triamcinolone (MYCOLOG II) cream 30 g 0    Sig: Apply 1 application topically 2 (two) times daily.

## 2021-09-21 LAB — COMPREHENSIVE METABOLIC PANEL
AG Ratio: 2.1 (calc) (ref 1.0–2.5)
ALT: 32 U/L (ref 9–46)
AST: 20 U/L (ref 10–40)
Albumin: 4.8 g/dL (ref 3.6–5.1)
Alkaline phosphatase (APISO): 76 U/L (ref 36–130)
BUN: 15 mg/dL (ref 7–25)
CO2: 28 mmol/L (ref 20–32)
Calcium: 9.4 mg/dL (ref 8.6–10.3)
Chloride: 101 mmol/L (ref 98–110)
Creat: 0.85 mg/dL (ref 0.60–1.29)
Globulin: 2.3 g/dL (calc) (ref 1.9–3.7)
Glucose, Bld: 94 mg/dL (ref 65–99)
Potassium: 3.8 mmol/L (ref 3.5–5.3)
Sodium: 138 mmol/L (ref 135–146)
Total Bilirubin: 0.3 mg/dL (ref 0.2–1.2)
Total Protein: 7.1 g/dL (ref 6.1–8.1)

## 2021-09-21 LAB — CBC WITH DIFFERENTIAL/PLATELET
Absolute Monocytes: 439 cells/uL (ref 200–950)
Basophils Absolute: 17 cells/uL (ref 0–200)
Basophils Relative: 0.2 %
Eosinophils Absolute: 129 cells/uL (ref 15–500)
Eosinophils Relative: 1.5 %
HCT: 40.3 % (ref 38.5–50.0)
Hemoglobin: 13.5 g/dL (ref 13.2–17.1)
Lymphs Abs: 2184 cells/uL (ref 850–3900)
MCH: 29.3 pg (ref 27.0–33.0)
MCHC: 33.5 g/dL (ref 32.0–36.0)
MCV: 87.6 fL (ref 80.0–100.0)
MPV: 9.5 fL (ref 7.5–12.5)
Monocytes Relative: 5.1 %
Neutro Abs: 5831 cells/uL (ref 1500–7800)
Neutrophils Relative %: 67.8 %
Platelets: 361 10*3/uL (ref 140–400)
RBC: 4.6 10*6/uL (ref 4.20–5.80)
RDW: 13.6 % (ref 11.0–15.0)
Total Lymphocyte: 25.4 %
WBC: 8.6 10*3/uL (ref 3.8–10.8)

## 2021-09-21 LAB — TESTOSTERONE TOTAL,FREE,BIO, MALES
Albumin: 4.8 g/dL (ref 3.6–5.1)
Sex Hormone Binding: 16 nmol/L (ref 10–50)
Testosterone: 171 ng/dL — ABNORMAL LOW (ref 250–827)

## 2021-09-21 LAB — HEMOGLOBIN A1C
Hgb A1c MFr Bld: 5.5 % of total Hgb (ref <5.7)
Mean Plasma Glucose: 111 mg/dL
eAG (mmol/L): 6.2 mmol/L

## 2021-09-23 ENCOUNTER — Encounter: Payer: Self-pay | Admitting: Family

## 2021-09-23 ENCOUNTER — Other Ambulatory Visit: Payer: Self-pay | Admitting: Family

## 2021-09-23 DIAGNOSIS — R7989 Other specified abnormal findings of blood chemistry: Secondary | ICD-10-CM

## 2021-10-07 ENCOUNTER — Other Ambulatory Visit: Payer: Self-pay | Admitting: Family

## 2021-10-07 ENCOUNTER — Telehealth: Payer: Self-pay | Admitting: *Deleted

## 2021-10-07 NOTE — Telephone Encounter (Signed)
Denyse Amass, RN; Vanessa Ralphs He is using an Leway[facility] Bunting. I will get you tagged      Previous Messages   ----- Message -----  From: Brandon Melnick, RN  Sent: 10/07/2021   2:11 PM EST  To: Vanessa Ralphs, Marchelle Gearing, *   Good afternoon,   This pt  Joshua Parrish  Male, 46 y.o., Mar 06, 1976  MRN:  [id number]   Has a machine.  I don't see him in resmed.     Can you check on this pt and see if he your pt?  What platform if so, and tag him in if so.     Acupuncturist

## 2021-10-08 ENCOUNTER — Ambulatory Visit: Payer: 59 | Admitting: Neurology

## 2021-10-08 ENCOUNTER — Encounter: Payer: Self-pay | Admitting: Physician Assistant

## 2021-10-08 ENCOUNTER — Ambulatory Visit: Payer: 59 | Admitting: Physician Assistant

## 2021-10-08 ENCOUNTER — Encounter: Payer: Self-pay | Admitting: Neurology

## 2021-10-08 ENCOUNTER — Other Ambulatory Visit: Payer: Self-pay

## 2021-10-08 VITALS — BP 123/64 | HR 75 | Ht 68.0 in | Wt 319.8 lb

## 2021-10-08 VITALS — BP 124/84 | HR 94 | Ht 68.0 in | Wt 318.4 lb

## 2021-10-08 DIAGNOSIS — Z9989 Dependence on other enabling machines and devices: Secondary | ICD-10-CM | POA: Diagnosis not present

## 2021-10-08 DIAGNOSIS — Z86010 Personal history of colonic polyps: Secondary | ICD-10-CM

## 2021-10-08 DIAGNOSIS — K219 Gastro-esophageal reflux disease without esophagitis: Secondary | ICD-10-CM

## 2021-10-08 DIAGNOSIS — R1319 Other dysphagia: Secondary | ICD-10-CM

## 2021-10-08 DIAGNOSIS — G4733 Obstructive sleep apnea (adult) (pediatric): Secondary | ICD-10-CM

## 2021-10-08 MED ORDER — NA SULFATE-K SULFATE-MG SULF 17.5-3.13-1.6 GM/177ML PO SOLN
1.0000 | ORAL | 0 refills | Status: DC
Start: 2021-10-08 — End: 2021-10-25

## 2021-10-08 NOTE — Patient Instructions (Signed)
PROCEDURES: You have been scheduled for an EGD and Colonoscopy. Please follow the written instructions given to you at your visit today. Please pick up your prep supplies at the pharmacy within the next 1-3 days. If you use inhalers (even only as needed), please bring them with you on the day of your procedure.   hank you for trusting me with your gastrointestinal care!    Ellouise Newer, PA     BMI:  If you are age 47 or older, your body mass index should be between 23-30. Your Body mass index is 48.41 kg/m. If this is out of the aforementioned range listed, please consider follow up with your Primary Care Provider.  If you are age 4 or younger, your body mass index should be between 19-25. Your Body mass index is 48.41 kg/m. If this is out of the aformentioned range listed, please consider follow up with your Primary Care Provider.   MY CHART:  The [facility] providers would like to encourage you to use [facility] to communicate with providers for non-urgent requests or questions.  Due to long hold times on the telephone, sending your provider a message by [facility] may be a faster and more efficient way to get a response.  Please allow 48 business hours for a response.  Please remember that this is for non-urgent requests.

## 2021-10-08 NOTE — Progress Notes (Signed)
Chief Complaint: Dysphagia and history of adenomatous polyps  HPI:    Joshua Parrish is a 46 year old male with a past medical history as listed below, previously assigned to Dr. Hilarie Fredrickson, who presents to clinic today with a complaint of dysphagia and history of adenomatous polyps.    10/16/2015 colonoscopy with 3 polyps.  Pathology showed tubular adenomas.  Repeat recommended in 3 years.    11/29/2015 patient seen in clinic by Alonza Bogus for ongoing abdominal pain and loose stool.  At that time discussed some rectal bleeding and foul drainage from his rectum.  He was treated empirically for fissure with Diltiazem.  Described worsening of his diarrhea and a stool culture and C. difficile were checked.  Continue with abdominal pain but was set to see the surgeons for possible cholecystectomy.  Was functional pain/IBS remained in the differential.    09/20/2021 CMP and CBC normal.    Today, the patient tells me that he has trouble with swallowing at times but blames this on the "metal plate they put in my neck in 2016".  Tells me that he has had trouble really ever since then feeling like things occasionally get caught and hung up.  Most recently this happened when he was eating a "bratwurst", but he tells me he was distracted.  Does complain of chronic heartburn and reflux symptoms for which he takes Omeprazole 40 mg daily which controls his symptoms fairly well.    Also aware he needs repeat colonoscopy due to his history of polyps.    Denies fever, chills, weight loss, blood in his stool, change in bowel habits or abdominal pain.  Past Medical History:  Diagnosis Date   Chicken pox    Depression    Fatty liver    GERD (gastroesophageal reflux disease)    Migraines    Osteoarthritis of shoulder    left   Rotator cuff impingement syndrome of left shoulder 06/19/2015   Sinusitis     Past Surgical History:  Procedure Laterality Date   APPENDECTOMY     CERVICAL LAMINECTOMY     has plates and fusion    CHOLECYSTECTOMY N/A 12/24/2015   Procedure: LAPAROSCOPIC CHOLECYSTECTOMY ;  Surgeon: [name] Pickerel, MD;  Location: [facility];  Service: General;  Laterality: N/A;   CLAVICLE EXCISION Left    HYPOSPADIAS CORRECTION     as child   KNEE SURGERY Bilateral    multiple   SHOULDER ARTHROSCOPY WITH ROTATOR CUFF REPAIR Left 06/26/2015   Procedure: SHOULDER ARTHROSCOPY WITH ROTATOR CUFF REPAIR;  Surgeon: Elsie Saas, MD;  Location: [place];  Service: Orthopedics;  Laterality: Left;    Current Outpatient Medications  Medication Sig Dispense Refill   allopurinol (ZYLOPRIM) 300 MG tablet TAKE ONE TABLET BY MOUTH DAILY 90 tablet 1   amLODipine (NORVASC) 10 MG tablet TAKE ONE TABLET BY MOUTH DAILY 30 tablet 2   buPROPion (WELLBUTRIN XL) 150 MG 24 hr tablet TAKE ONE TABLET BY MOUTH DAILY 90 tablet 0   escitalopram (LEXAPRO) 20 MG tablet TAKE ONE TABLET BY MOUTH DAILY 90 tablet 1   gabapentin (NEURONTIN) 300 MG capsule Take 1 capsule (300 mg total) by mouth 3 (three) times daily as needed. 90 capsule 1   ipratropium (ATROVENT) 0.03 % nasal spray Place 2 sprays into both nostrils every 12 (twelve) hours. 30 mL 0   methocarbamol (ROBAXIN) 500 MG tablet Take 1 tablet (500 mg total) by mouth at bedtime as needed. 30 tablet 0   nystatin-triamcinolone (MYCOLOG II)  cream Apply 1 application topically 2 (two) times daily. 30 g 0   omeprazole (PRILOSEC) 40 MG capsule Take 1 capsule (40 mg total) by mouth daily. 90 capsule 3   spironolactone (ALDACTONE) 25 MG tablet TAKE ONE TABLET BY MOUTH DAILY 30 tablet 2   triamcinolone cream (KENALOG) 0.1 % Apply 1 application topically 2 (two) times daily. 30 g 0   valsartan-hydrochlorothiazide (DIOVAN-HCT) 320-25 MG tablet TAKE ONE TABLET BY MOUTH DAILY 90 tablet 1   No current facility-administered medications for this visit.    Allergies as of 10/08/2021 - Review Complete 10/08/2021  Allergen Reaction Noted   Food Diarrhea and Nausea And Vomiting  05/26/2017   Lisinopril Cough 11/12/2020    Family History  Problem Relation Age of Onset   Arthritis Mother    Ovarian cancer Mother    Alcohol abuse Father    Breast cancer Maternal Grandmother    Cancer Maternal Grandmother        spinal surgery   Breast cancer Maternal Grandfather    Cancer Other    Hypertension Other    Diabetes Other    Colon cancer Neg Hx    Sleep apnea Neg Hx     Social History   Socioeconomic History   Marital status: Divorced    Spouse name: Not on file   Number of children: 1   Years of education: 12   Highest education level: Not on file  Occupational History   Occupation: Garage Interior and spatial designer   Tobacco Use   Smoking status: Never   Smokeless tobacco: Current    Types: Chew   Tobacco comments:    form given 11/29/15  Vaping Use   Vaping Use: Never used  Substance and Sexual Activity   Alcohol use: Yes    Alcohol/week: 0.0 standard drinks    Comment: occasional   Drug use: No   Sexual activity: Yes  Other Topics Concern   Not on file  Social History Narrative   Fun: Lift weights, hike   Social Determinants of Health   Financial Resource Strain: Not on file  Food Insecurity: Not on file  Transportation Needs: Not on file  Physical Activity: Not on file  Stress: Not on file  Social Connections: Not on file  Intimate Partner Violence: Not on file    Review of Systems:    Constitutional: No weight loss, fever or chills Skin: No rash Cardiovascular: No chest pain Respiratory: No SOB Gastrointestinal: See HPI and otherwise negative Genitourinary: No dysuria  Neurological: No headache, dizziness or syncope Musculoskeletal: No new muscle or joint pain Hematologic: No bleeding Psychiatric: No history of depression or anxiety   Physical Exam:  Vital signs: BP 124/84    Pulse 94    Ht 5\' 8"  (1.727 m)    Wt (!) 318 lb 6.4 oz (144.4 kg)    SpO2 97%    BMI 48.41 kg/m    Constitutional:   Pleasant obese Caucasian male appears  to be in NAD, Well developed, Well nourished, alert and cooperative Head:  Normocephalic and atraumatic. Eyes:   PEERL, EOMI. No icterus. Conjunctiva pink. Ears:  Normal auditory acuity. Neck:  Supple Throat: Oral cavity and pharynx without inflammation, swelling or lesion.  Respiratory: Respirations even and unlabored. Lungs clear to auscultation bilaterally.   No wheezes, crackles, or rhonchi.  Cardiovascular: Normal S1, S2. No MRG. Regular rate and rhythm. No peripheral edema, cyanosis or pallor.  Gastrointestinal:  Soft, nondistended, nontender. No rebound or guarding. Normal bowel  sounds. No appreciable masses or hepatomegaly. Rectal:  Not performed.  Msk:  Symmetrical without gross deformities. Without edema, no deformity or joint abnormality.  Neurologic:  Alert and  oriented x4;  grossly normal neurologically.  Skin:   Dry and intact without significant lesions or rashes. Psychiatric: Demonstrates good judgement and reason without abnormal affect or behaviors.  RELEVANT LABS AND IMAGING: CBC    Component Value Date/Time   WBC 8.6 09/20/2021 1544   RBC 4.60 09/20/2021 1544   HGB 13.5 09/20/2021 1544   HCT 40.3 09/20/2021 1544   PLT 361 09/20/2021 1544   MCV 87.6 09/20/2021 1544   MCH 29.3 09/20/2021 1544   MCHC 33.5 09/20/2021 1544   RDW 13.6 09/20/2021 1544   LYMPHSABS 2,184 09/20/2021 1544   MONOABS 0.5 01/15/2021 1559   EOSABS 129 09/20/2021 1544   BASOSABS 17 09/20/2021 1544    CMP     Component Value Date/Time   NA 138 09/20/2021 1544   K 3.8 09/20/2021 1544   CL 101 09/20/2021 1544   CO2 28 09/20/2021 1544   GLUCOSE 94 09/20/2021 1544   BUN 15 09/20/2021 1544   CREATININE 0.85 09/20/2021 1544   CALCIUM 9.4 09/20/2021 1544   PROT 7.1 09/20/2021 1544   ALBUMIN 4.6 04/19/2021 0849   AST 20 09/20/2021 1544   ALT 32 09/20/2021 1544   ALKPHOS 66 04/19/2021 0849   BILITOT 0.3 09/20/2021 1544   GFRNONAA >60 02/06/2019 2359   GFRAA >60 02/06/2019 2359     Assessment: 1.  Dysphagia: Symptoms ever since 2016 and neck fusion, slight increase recently with chronic reflux symptoms; consider stricture versus relation to extrinsic compression versus other 2.  GERD: Moderately controlled on Omeprazole 40 daily 3.  History of adenomatous polyps: Last colonoscopy in 2017 with repeat recommended in 3 years, he is overdue  Plan: 1.  Scheduled patient for an EGD with dilation and surveillance colonoscopy in the [place] with Dr. Hilarie Fredrickson due to his history of adenomatous polyps and complaint of dysphagia.  Did provide the patient a detailed list of risks for the procedures and he agrees to proceed. Patient is appropriate for endoscopic procedure(s) in the ambulatory ([place]) setting.  2.  Reviewed antidysphagia measures. 3.  Continue Omeprazole 40 mg daily.  This is prescribed by his PCP. 4.  Patient to follow in clinic per recommendations after time of procedures.  Ellouise Newer, PA-C [facility] 10/08/2021, 2:50 PM  Cc: Joshua Parrish,*

## 2021-10-08 NOTE — Progress Notes (Signed)
Subjective:    Patient ID: Joshua Parrish is a 46 y.o. male.  HPI    Interim history:   Joshua Parrish is a 46 year old right-handed gentleman with an underlying medical history of gout, reflux disease, migraine headaches, osteoarthritis,, status post multiple surgeries including several knee surgeries, shoulder surgeries and neck surgery, sinusitis, hypertension, and severe obesity with a BMI of over 68, who presents for follow-up consultation of his obstructive sleep apnea after interim testing and starting AutoPap therapy.  The patient is unaccompanied today.  I first met him at the request of his primary care nurse practitioner on 11/28/2020, at which time he reported snoring and excessive daytime somnolence as well as witnessed apneas.  He was advised to proceed with sleep testing.  He had a home sleep test on 12/17/2020 which indicated severe obstructive sleep apnea with an AHI of 40.1/h, O2 nadir 73% with moderate to loud snoring detected.  He was advised to start AutoPap therapy.  His set up date was 01/30/21. He has an Chief Executive Officer.  Today, 10/08/21:  I reviewed his AutoPap compliance data from 09/08/2021 to 10/07/2021, which is a total of 30 days, during which time he used his machine every night with percent used days greater than 4 hours at 100%, indicating superb compliance with an average usage of 8.2 hours/day.  His pressure ranges 7 cm to 15 cm with a 95th percentile at 9.2 cm, maximum average pressure of 11.3 cm.  Leak on the higher side with a 95th percentile at 18.1 L/min, average AHI excellent at 0.4/h.  He reports having adjusted well to treatment.  He reports benefit with regards to his sleep consolidation and sleep quality, he feels much better rested.  His nocturia is less as well.  Of note, he was recently diagnosed with low testosterone and is waiting to start hormone replacement.  He is working on weight loss.  He is curious about the inspire and we talked a little bit about his candidacy  for this eventually if need be.  He is currently using a ResMed AirTouch fullface mask.  He tried 2 different mask styles before but did not tolerate them.  This is a good compromise for him, he does need to be able to breathe through his mouth, a nasal mask caused significant difficulty breathing for him.  He has some mouth dryness, he drinks quite a bit of water throughout the day and has to drink at least a bottle of water first thing in the morning.  He does have the humidity setting high on the machine.  Sometimes he pulls the mask off in the middle of the night and does not realize it.  He has found himself sleeping more on his stomach lately.  He is very motivated to continue with treatment and pleased with his outcome thus far.  The patient's allergies, current medications, family history, past medical history, past social history, past surgical history and problem list were reviewed and updated as appropriate.   Previously:   11/28/20: (He) reports snoring and excessive daytime somnolence. I reviewed your office note from 10/26/2020.  His Epworth sleepiness score is 8 out of 24, fatigue severity score is 28 out of 63.  His girlfriend has noted apneas while he is asleep.  He lives with his girlfriend, no children, 3 cats in the household.  He does fall asleep with the TV on, he has it on a timer typically.  He goes to bed typically around 9 and rise  time is generally between 5 and 5:30 AM.  He works as a Estate manager/land agent.  He has nocturia about once or twice per average night and has had the occasional morning headache.  He is not aware of any family history of sleep apnea.  He has been working out on a regular basis but has had weight gain as well.  He does not smoke cigarettes but uses tobacco dip, 1 can/day on average.  He drinks alcohol infrequently, typically 1 drink on a Saturday.  He drinks caffeine in the form of soda, 1 glass/day on average.  He has noticed fluid  retention and lower extremity edema.  His Past Medical History Is Significant For: Past Medical History:  Diagnosis Date   Chicken pox    Depression    Fatty liver    GERD (gastroesophageal reflux disease)    Migraines    Osteoarthritis of shoulder    left   Rotator cuff impingement syndrome of left shoulder 06/19/2015   Sinusitis     His Past Surgical History Is Significant For: Past Surgical History:  Procedure Laterality Date   APPENDECTOMY     CERVICAL LAMINECTOMY     has plates and fusion   CHOLECYSTECTOMY N/A 12/24/2015   Procedure: LAPAROSCOPIC CHOLECYSTECTOMY ;  Surgeon: [name] Pickerel, MD;  Location: [facility];  Service: General;  Laterality: N/A;   CLAVICLE EXCISION Left    HYPOSPADIAS CORRECTION     as child   KNEE SURGERY Bilateral    multiple   SHOULDER ARTHROSCOPY WITH ROTATOR CUFF REPAIR Left 06/26/2015   Procedure: SHOULDER ARTHROSCOPY WITH ROTATOR CUFF REPAIR;  Surgeon: Elsie Saas, MD;  Location: [facility];  Service: Orthopedics;  Laterality: Left;    His Family History Is Significant For: Family History  Problem Relation Age of Onset   Arthritis Mother    Ovarian cancer Mother    Alcohol abuse Father    Breast cancer Maternal Grandmother    Cancer Maternal Grandmother        spinal surgery   Breast cancer Maternal Grandfather    Cancer Other    Hypertension Other    Diabetes Other    Colon cancer Neg Hx    Sleep apnea Neg Hx     His Social History Is Significant For: Social History   Socioeconomic History   Marital status: Divorced    Spouse name: Not on file   Number of children: 1   Years of education: 12   Highest education level: Not on file  Occupational History   Occupation: Garage Interior and spatial designer   Tobacco Use   Smoking status: Never   Smokeless tobacco: Current    Types: Chew   Tobacco comments:    form given 11/29/15  Vaping Use   Vaping Use: Never used  Substance and Sexual Activity   Alcohol use: Yes     Alcohol/week: 0.0 standard drinks    Comment: occasional   Drug use: No   Sexual activity: Yes  Other Topics Concern   Not on file  Social History Narrative   Fun: Lift weights, hike   Social Determinants of Health   Financial Resource Strain: Not on file  Food Insecurity: Not on file  Transportation Needs: Not on file  Physical Activity: Not on file  Stress: Not on file  Social Connections: Not on file    His Allergies Are:  Allergies  Allergen Reactions   Food Diarrhea and Nausea And Vomiting  COCONUT   Lisinopril Cough  :   His Current Medications Are:  Outpatient Encounter Medications as of 10/08/2021  Medication Sig   allopurinol (ZYLOPRIM) 300 MG tablet TAKE ONE TABLET BY MOUTH DAILY   amLODipine (NORVASC) 10 MG tablet TAKE ONE TABLET BY MOUTH DAILY   buPROPion (WELLBUTRIN XL) 150 MG 24 hr tablet TAKE ONE TABLET BY MOUTH DAILY   escitalopram (LEXAPRO) 20 MG tablet TAKE ONE TABLET BY MOUTH DAILY   gabapentin (NEURONTIN) 300 MG capsule Take 1 capsule (300 mg total) by mouth 3 (three) times daily as needed.   ipratropium (ATROVENT) 0.03 % nasal spray Place 2 sprays into both nostrils every 12 (twelve) hours.   methocarbamol (ROBAXIN) 500 MG tablet Take 1 tablet (500 mg total) by mouth at bedtime as needed.   nystatin-triamcinolone (MYCOLOG II) cream Apply 1 application topically 2 (two) times daily.   omeprazole (PRILOSEC) 40 MG capsule Take 1 capsule (40 mg total) by mouth daily.   spironolactone (ALDACTONE) 25 MG tablet TAKE ONE TABLET BY MOUTH DAILY   triamcinolone cream (KENALOG) 0.1 % Apply 1 application topically 2 (two) times daily.   valsartan-hydrochlorothiazide (DIOVAN-HCT) 320-25 MG tablet TAKE ONE TABLET BY MOUTH DAILY   No facility-administered encounter medications on file as of 10/08/2021.  :  Review of Systems:  Out of a complete 14 point review of systems, all are reviewed and negative with the exception of these symptoms as listed below:   Review  of Systems  Neurological:        Pt is here for CPAP follow up Pt states he was doing fine with CPAP in the beginning but now struggles to keep mask on . Pt states he takes mask off in the middle of the night without him knowing it .    Objective:  Neurological Exam  Physical Exam Physical Examination:   Vitals:   10/08/21 0734  BP: 123/64  Pulse: 75    General Examination: The patient is a very pleasant 46 y.o. male in no acute distress. He appears well-developed and well-nourished and well groomed.   HEENT: Normocephalic, atraumatic, pupils are equal, round and reactive to light, extraocular tracking is good without limitation to gaze excursion or nystagmus noted.  Corrective eyeglasses in place.  Hearing is grossly intact. Face is symmetric with normal facial animation. Speech is clear with no dysarthria noted. There is no hypophonia. There is no lip, neck/head, jaw or voice tremor. Neck is supple with full range of passive and active motion. There are no carotid bruits on auscultation. Oropharynx exam reveals: Moderate mouth dryness, good dental hygiene and marked airway crowding, Mallampati class II. Tongue protrudes centrally and palate elevates symmetrically.    Chest: Clear to auscultation without wheezing, rhonchi or crackles noted.   Heart: S1+S2+0, regular and normal without murmurs, rubs or gallops noted.    Abdomen: Soft, non-tender and non-distended.   Extremities: There is no obvious abnormality noted.      Skin: Warm and dry without trophic changes noted.    Musculoskeletal: exam reveals no obvious joint deformities.    Neurologically:  Mental status: The patient is awake, alert and oriented in all 4 spheres. His immediate and remote memory, attention, language skills and fund of knowledge are appropriate. There is no evidence of aphasia, agnosia, apraxia or anomia. Speech is clear with normal prosody and enunciation. Thought process is linear. Mood is normal and  affect is normal.  Cranial nerves II - XII are as described above under HEENT  exam.  Motor exam: Normal bulk, strength and tone is noted. There is no tremor. Fine motor skills and coordination: grossly intact.  Cerebellar testing: No dysmetria or intention tremor. There is no truncal or gait ataxia.  Sensory exam: intact to light touch in the upper and lower extremities.  Gait, station and balance: He stands easily. No veering to one side is noted. No leaning to one side is noted. Posture is age-appropriate and stance is narrow based. Gait shows normal stride length and normal pace. No problems turning are noted.                Assessment and plan:  In summary, Joshua Parrish is a very pleasant 46 year old male with an underlying medical history of gout, reflux disease, migraine headaches, osteoarthritis, status post multiple surgeries including several knee surgeries, shoulder surgeries and neck surgery, sinusitis, hypertension, and severe obesity with a BMI of over 76, who presents for follow-up consultation of his obstructive sleep apnea which was deemed in the severe range by home sleep testing on 12/17/2020.  His AHI was 40.1/h, O2 nadir 73%.  He has established treatment with AutoPap therapy since June 2022 and is compliant with treatment.  He has benefited from it as well and is commended for his excellent treatment adherence.  He is working on lifestyle changes including weight loss.  He is hydrating well with water, he is also diagnosed with low testosterone recently and sees urology for this.  He is pending to start hormone replacement as I understand.  He is advised to make sure he changes his supplies on a regular basis.  Per insurance, he reports that his mask can only be replaced every 8 months but he is contemplating to buy a mask in between and get it reimbursed through his flexible healthcare spending account which is reasonable.  He is advised to change the filter if possible every month.  He is  also suffering from some mouth dryness.  He has increased the humidity setting on the AutoPap machine but is also advised to add a room humidifier during the season.  Furthermore, I suggested we change his maximum pressure from 15 cm to 12 cm at this time, his 95th percentile of pressure is around 9.  He is agreeable with well with this pressure change, he can always call us and we can change her back to the current settings of 7 cm to 15 cm.  He is advised to follow-up routinely in this clinic in 1 year, sooner if needed.  He can follow-up with one of our nurse practitioners can offer him a MyChart virtual visit if the need arises.  I answered all his questions today and he was in agreement.   I spent 35 minutes in total face-to-face time and in reviewing records during pre-charting, more than 50% of which was spent in counseling and coordination of care, reviewing test results, reviewing medications and treatment regimen and/or in discussing or reviewing the diagnosis of OSA, the prognosis and treatment options. Pertinent laboratory and imaging test results that were available during this visit with the patient were reviewed by me and considered in my medical decision making (see chart for details).

## 2021-10-08 NOTE — Patient Instructions (Addendum)
Please continue using your autoPAP regularly. While your insurance requires that you use PAP at least 4 hours each night on 70% of the nights, I recommend, that you not skip any nights and use it throughout the night if you can. Getting used to PAP and staying with the treatment long term does take time and patience and discipline. Untreated obstructive sleep apnea when it is moderate to severe can have an adverse impact on cardiovascular health and raise her risk for heart disease, arrhythmias, hypertension, congestive heart failure, stroke and diabetes. Untreated obstructive sleep apnea causes sleep disruption, nonrestorative sleep, and sleep deprivation. This can have an impact on your day to day functioning and cause daytime sleepiness and impairment of cognitive function, memory loss, mood disturbance, and problems focussing. Using PAP regularly can improve these symptoms.  You are fully compliant with your AutoPap and your apnea scores look good.  We can reduce your maximum pressure to see if you tolerate the settings at a lower level a little bit better.  Your average pressure is around 9.2 cm.  Currently, your maximum pressure is set for 15 cm.  We can go down to 12 cm.  Your air leak from the mask is on the higher side.  Please make sure you continue to change your mask on a regular basis, and you can also talk to your DME provider about a change in the mask style.  Keep up the good work!   At this juncture, you can follow-up yearly.  Please make an appointment to see one of our nurse practitioners in 1 year.  We can offer you a virtual visit through MyChart if you prefer.

## 2021-10-09 NOTE — Progress Notes (Signed)
Denyse Amass, RN; Vanessa Ralphs got it      Previous Messages   ----- Message -----  From: Brandon Melnick, RN  Sent: 10/09/2021  10:54 AM EST  To: Ocie Bob, *   New orders in Epic "change autopap max pressure from 15cm to 12cm for better tolerance"   Chavis A. Langhans  Male, 46 y.o., 1976-03-21  MRN:  [id number]   [facility]

## 2021-10-10 NOTE — Progress Notes (Signed)
Addendum: Reviewed and agree with assessment and management plan. Catlynn Grondahl M, MD  

## 2021-10-17 ENCOUNTER — Other Ambulatory Visit: Payer: Self-pay | Admitting: Family

## 2021-10-18 ENCOUNTER — Encounter: Payer: Self-pay | Admitting: Internal Medicine

## 2021-10-24 ENCOUNTER — Encounter: Payer: Self-pay | Admitting: Family

## 2021-10-25 ENCOUNTER — Ambulatory Visit (AMBULATORY_SURGERY_CENTER): Payer: 59 | Admitting: Internal Medicine

## 2021-10-25 ENCOUNTER — Encounter: Payer: Self-pay | Admitting: Internal Medicine

## 2021-10-25 VITALS — BP 99/57 | HR 89 | Temp 99.8°F | Resp 19 | Ht 68.0 in | Wt 318.0 lb

## 2021-10-25 DIAGNOSIS — K219 Gastro-esophageal reflux disease without esophagitis: Secondary | ICD-10-CM

## 2021-10-25 DIAGNOSIS — K2970 Gastritis, unspecified, without bleeding: Secondary | ICD-10-CM | POA: Diagnosis not present

## 2021-10-25 DIAGNOSIS — K317 Polyp of stomach and duodenum: Secondary | ICD-10-CM

## 2021-10-25 DIAGNOSIS — K222 Esophageal obstruction: Secondary | ICD-10-CM | POA: Diagnosis not present

## 2021-10-25 DIAGNOSIS — D122 Benign neoplasm of ascending colon: Secondary | ICD-10-CM | POA: Diagnosis not present

## 2021-10-25 DIAGNOSIS — D125 Benign neoplasm of sigmoid colon: Secondary | ICD-10-CM

## 2021-10-25 DIAGNOSIS — D120 Benign neoplasm of cecum: Secondary | ICD-10-CM | POA: Diagnosis not present

## 2021-10-25 DIAGNOSIS — Z860101 Personal history of adenomatous and serrated colon polyps: Secondary | ICD-10-CM

## 2021-10-25 DIAGNOSIS — Z86010 Personal history of colonic polyps: Secondary | ICD-10-CM | POA: Diagnosis not present

## 2021-10-25 DIAGNOSIS — K319 Disease of stomach and duodenum, unspecified: Secondary | ICD-10-CM

## 2021-10-25 DIAGNOSIS — K229 Disease of esophagus, unspecified: Secondary | ICD-10-CM

## 2021-10-25 DIAGNOSIS — K3189 Other diseases of stomach and duodenum: Secondary | ICD-10-CM

## 2021-10-25 DIAGNOSIS — D123 Benign neoplasm of transverse colon: Secondary | ICD-10-CM | POA: Diagnosis not present

## 2021-10-25 DIAGNOSIS — R1319 Other dysphagia: Secondary | ICD-10-CM | POA: Diagnosis present

## 2021-10-25 MED ORDER — SODIUM CHLORIDE 0.9 % IV SOLN: 500.0000 mL | Freq: Once | INTRAVENOUS | Status: DC

## 2021-10-25 NOTE — Progress Notes (Signed)
C.W. vital signs. 

## 2021-10-25 NOTE — Progress Notes (Signed)
Called to room to assist during endoscopic procedure.  Patient ID and intended procedure confirmed with present staff. Received instructions for my participation in the procedure from the performing physician.  

## 2021-10-25 NOTE — Patient Instructions (Signed)
Information on polyps, strictures and gastritis given to you today.  Await pathology results.  Resume previous diet and medications.    YOU HAD AN ENDOSCOPIC PROCEDURE TODAY AT [facility]:   Refer to the procedure report that was given to you for any specific questions about what was found during the examination.  If the procedure report does not answer your questions, please call your gastroenterologist to clarify.  If you requested that your care partner not be given the details of your procedure findings, then the procedure report has been included in a sealed envelope for you to review at your convenience later.  YOU SHOULD EXPECT: Some feelings of bloating in the abdomen. Passage of more gas than usual.  Walking can help get rid of the air that was put into your GI tract during the procedure and reduce the bloating. If you had a lower endoscopy (such as a colonoscopy or flexible sigmoidoscopy) you may notice spotting of blood in your stool or on the toilet paper. If you underwent a bowel prep for your procedure, you may not have a normal bowel movement for a few days.  Please Note:  You might notice some irritation and congestion in your nose or some drainage.  This is from the oxygen used during your procedure.  There is no need for concern and it should clear up in a day or so.  SYMPTOMS TO REPORT IMMEDIATELY:  Following lower endoscopy (colonoscopy or flexible sigmoidoscopy):  Excessive amounts of blood in the stool  Significant tenderness or worsening of abdominal pains  Swelling of the abdomen that is new, acute  Fever of 100F or higher  Following upper endoscopy (EGD)  Vomiting of blood or coffee ground material  New chest pain or pain under the shoulder blades  Painful or persistently difficult swallowing  New shortness of breath  Fever of 100F or higher  Black, tarry-looking stools  For urgent or emergent issues, a gastroenterologist can be reached at  any hour by calling [phone]. Do not use MyChart messaging for urgent concerns.    DIET:  We do recommend a small meal at first, but then you may proceed to your regular diet.  Drink plenty of fluids but you should avoid alcoholic beverages for 24 hours.  ACTIVITY:  You should plan to take it easy for the rest of today and you should NOT DRIVE or use heavy machinery until tomorrow (because of the sedation medicines used during the test).    FOLLOW UP: Our staff will call the number listed on your records 48-72 hours following your procedure to check on you and address any questions or concerns that you may have regarding the information given to you following your procedure. If we do not reach you, we will leave a message.  We will attempt to reach you two times.  During this call, we will ask if you have developed any symptoms of COVID 19. If you develop any symptoms (ie: fever, flu-like symptoms, shortness of breath, cough etc.) before then, please call [phone].  If you test positive for Covid 19 in the 2 weeks post procedure, please call and report this information to [place].    If any biopsies were taken you will be contacted by phone or by letter within the next 1-3 weeks.  Please call us at [phone] if you have not heard about the biopsies in 3 weeks.    SIGNATURES/CONFIDENTIALITY: You and/or your care partner have signed paperwork which  will be entered into your electronic medical record.  These signatures attest to the fact that that the information above on your After Visit Summary has been reviewed and is understood.  Full responsibility of the confidentiality of this discharge information lies with you and/or your care-partner.

## 2021-10-25 NOTE — Op Note (Signed)
[place] Patient Name: Joshua Parrish Procedure Date: 10/25/2021 3:35 PM MRN: [id number] Endoscopist: Jerene Bears , MD Age: 46 Referring MD:  Date of Birth: [birth year] Gender: Male Account #: [id number] Procedure:                Colonoscopy Indications:              High risk colon cancer surveillance: Personal                            history of multiple (3) adenomas, Last colonoscopy:                            February 2017 Medicines:                Monitored Anesthesia Care Procedure:                Pre-Anesthesia Assessment:                           - Prior to the procedure, a History and Physical                            was performed, and patient medications and                            allergies were reviewed. The patient's tolerance of                            previous anesthesia was also reviewed. The risks                            and benefits of the procedure and the sedation                            options and risks were discussed with the patient.                            All questions were answered, and informed consent                            was obtained. Prior Anticoagulants: The patient has                            taken no previous anticoagulant or antiplatelet                            agents. ASA Grade Assessment: III - A patient with                            severe systemic disease. After reviewing the risks                            and benefits, the patient was deemed in  satisfactory condition to undergo the procedure.                           After obtaining informed consent, the colonoscope                            was passed under direct vision. Throughout the                            procedure, the patient's blood pressure, pulse, and                            oxygen saturations were monitored continuously. The                            CF [id number] #[id number] was introduced through the anus                             and advanced to the cecum, identified by                            appendiceal orifice and ileocecal valve. The                            colonoscopy was performed without difficulty. The                            patient tolerated the procedure well. The quality                            of the bowel preparation was good. The ileocecal                            valve, appendiceal orifice, and rectum were                            photographed. Scope In: 3:53:50 PM Scope Out: 4:13:54 PM Scope Withdrawal Time: 0 hours 14 minutes 49 seconds  Total Procedure Duration: 0 hours 20 minutes 4 seconds  Findings:                 The digital rectal exam was normal.                           A 4 mm polyp was found in the cecum. The polyp was                            sessile. The polyp was removed with a cold snare.                            Resection and retrieval were complete.                           Two sessile polyps were found in the ascending  colon. The polyps were 4 to 6 mm in size. These                            polyps were removed with a cold snare. Resection                            and retrieval were complete.                           Three sessile polyps were found in the transverse                            colon. The polyps were 5 to 9 mm in size. These                            polyps were removed with a cold snare. Resection                            and retrieval were complete.                           A 4 mm polyp was found in the sigmoid colon. The                            polyp was sessile. The polyp was removed with a                            cold snare. Resection and retrieval were complete.                           The retroflexed view of the distal rectum and anal                            verge was normal and showed no anal or rectal                            abnormalities. Complications:             No immediate complications. Estimated Blood Loss:     Estimated blood loss was minimal. Impression:               - One 4 mm polyp in the cecum, removed with a cold                            snare. Resected and retrieved.                           - Two 4 to 6 mm polyps in the ascending colon,                            removed with a cold snare. Resected and retrieved.                           -  Three 5 to 9 mm polyps in the transverse colon,                            removed with a cold snare. Resected and retrieved.                           - One 4 mm polyp in the sigmoid colon, removed with                            a cold snare. Resected and retrieved.                           - The distal rectum and anal verge are normal on                            retroflexion view. Recommendation:           - Patient has a contact number available for                            emergencies. The signs and symptoms of potential                            delayed complications were discussed with the                            patient. Return to normal activities tomorrow.                            Written discharge instructions were provided to the                            patient.                           - Resume previous diet.                           - Continue present medications.                           - Await pathology results.                           - Repeat colonoscopy is recommended for                            surveillance. The colonoscopy date will be                            determined after pathology results from today's                            exam become available for review. Jerene Bears, MD 10/25/2021 4:24:12 PM This report has been signed electronically.

## 2021-10-25 NOTE — Progress Notes (Signed)
Report to PACU, RN, vss, BBS= Clear.  

## 2021-10-25 NOTE — Op Note (Signed)
[place] Patient Name: Joshua Parrish Procedure Date: 10/25/2021 3:36 PM MRN: [id number] Endoscopist: Jerene Bears , MD Age: 46 Referring MD:  Date of Birth: Dec 13, 1975 Gender: Male Account #: [id number] Procedure:                Upper GI endoscopy Indications:              Dysphagia, Gastro-esophageal reflux disease Medicines:                Monitored Anesthesia Care Procedure:                Pre-Anesthesia Assessment:                           - Prior to the procedure, a History and Physical                            was performed, and patient medications and                            allergies were reviewed. The patient's tolerance of                            previous anesthesia was also reviewed. The risks                            and benefits of the procedure and the sedation                            options and risks were discussed with the patient.                            All questions were answered, and informed consent                            was obtained. Prior Anticoagulants: The patient has                            taken no previous anticoagulant or antiplatelet                            agents. ASA Grade Assessment: III - A patient with                            severe systemic disease. After reviewing the risks                            and benefits, the patient was deemed in                            satisfactory condition to undergo the procedure.                           After obtaining informed consent, the endoscope was  passed under direct vision. Throughout the                            procedure, the patient's blood pressure, pulse, and                            oxygen saturations were monitored continuously. The                            GIF HQ190 #[id number] was introduced through the                            mouth, and advanced to the second part of duodenum.                            The upper GI  endoscopy was accomplished without                            difficulty. The patient tolerated the procedure                            well. Scope In: Scope Out: Findings:                 Two benign-appearing, intrinsic mild stenoses were                            found at the gastroesophageal junction and just                            above the GEJ. The narrowest stenosis measured 1.4                            - 1.5 cm (inner diameter) x less than one cm (in                            length). The stenoses were traversed. A TTS dilator                            was passed through the scope. Dilation with an                            18-19-20 mm balloon dilator was performed to 18 mm.                           The Z-line was irregular and was found 40 cm from                            the incisors. Biopsies were taken with a cold                            forceps for histology.  Mild inflammation characterized by nodularity,                            congestion (edema) and erythema was found in the                            gastric antrum. Biopsies were taken with a cold                            forceps for histology and Helicobacter pylori                            testing.                           A few small sessile polyps were found in the                            gastric fundus and in the gastric body. Biopsies                            were taken with a cold forceps for histology.                           The examined duodenum was normal. Complications:            No immediate complications. Estimated Blood Loss:     Estimated blood loss was minimal. Impression:               - Benign-appearing esophageal stenoses. Dilated.                           - Z-line irregular, 40 cm from the incisors.                            Biopsied.                           - A few gastric polyps (fundus/body). Benign with                             typical appearance of fundic gland polyps. Biopsied.                           - Gastritis. Biopsied.                           - Normal examined duodenum. Recommendation:           - Patient has a contact number available for                            emergencies. The signs and symptoms of potential                            delayed complications were discussed with the  patient. Return to normal activities tomorrow.                            Written discharge instructions were provided to the                            patient.                           - Resume previous diet.                           - Continue present medications.                           - Await pathology results.                           - See the other procedure note for documentation of                            additional recommendations. Jerene Bears, MD 10/25/2021 4:20:15 PM This report has been signed electronically.

## 2021-10-25 NOTE — Progress Notes (Signed)
Patient presenting for upper endoscopy to evaluate dysphagia in the setting of prior history of GERD.  Also colonoscopy given history of adenomatous colon polyps.  No significant changes in medical history since being seen in the office on 10/08/2021 see that note for details  He denies recent chest pain, shortness of breath.  No abdominal pain today.  He remains appropriate for upper endoscopy and colonoscopy in the [place] today.

## 2021-10-25 NOTE — Progress Notes (Signed)
Pt's states no medical or surgical changes since previsit or office visit. 

## 2021-10-29 ENCOUNTER — Telehealth: Payer: Self-pay

## 2021-10-29 ENCOUNTER — Telehealth: Payer: Self-pay | Admitting: *Deleted

## 2021-10-29 NOTE — Telephone Encounter (Signed)
°  Follow up Call-  Call back number 10/25/2021  Post procedure Call Back phone  # [phone]  Permission to leave phone message Yes  Some recent data might be hidden     Patient questions:  Do you have a fever, pain , or abdominal swelling? No. Pain Score  0 *  Have you tolerated food without any problems? Yes.    Have you been able to return to your normal activities? Yes.    Do you have any questions about your discharge instructions: Diet   No. Medications  No. Follow up visit  No.  Do you have questions or concerns about your Care? No.  Actions: * If pain score is 4 or above: No action needed, pain <4.

## 2021-10-29 NOTE — Telephone Encounter (Signed)
Attempted followup call. No answer; left message.

## 2021-11-04 ENCOUNTER — Encounter: Payer: Self-pay | Admitting: Internal Medicine

## 2021-11-06 ENCOUNTER — Other Ambulatory Visit: Payer: Self-pay | Admitting: Family

## 2021-11-13 ENCOUNTER — Other Ambulatory Visit: Payer: Self-pay | Admitting: Family

## 2021-11-13 DIAGNOSIS — L309 Dermatitis, unspecified: Secondary | ICD-10-CM

## 2021-11-16 ENCOUNTER — Encounter: Payer: Self-pay | Admitting: Family

## 2021-11-18 ENCOUNTER — Other Ambulatory Visit: Payer: Self-pay | Admitting: Family

## 2021-11-21 ENCOUNTER — Telehealth: Payer: Self-pay | Admitting: Family

## 2021-11-21 NOTE — Telephone Encounter (Signed)
[facility] is requesting referral notes from January. Please advise.  ?

## 2021-11-21 NOTE — Telephone Encounter (Signed)
I have called [facility] to get the fax number.  ? ?Notes have been faxed  ?

## 2021-11-22 ENCOUNTER — Encounter: Payer: Self-pay | Admitting: Family

## 2021-11-22 ENCOUNTER — Telehealth: Payer: 59 | Admitting: Nurse Practitioner

## 2021-11-22 DIAGNOSIS — M545 Low back pain, unspecified: Secondary | ICD-10-CM | POA: Diagnosis not present

## 2021-11-22 MED ORDER — CYCLOBENZAPRINE HCL 10 MG PO TABS: 10.0000 mg | ORAL_TABLET | Freq: Three times a day (TID) | ORAL | 0 refills | Status: DC | PRN

## 2021-11-22 MED ORDER — NAPROXEN 500 MG PO TABS: 500.0000 mg | ORAL_TABLET | Freq: Two times a day (BID) | ORAL | 0 refills | Status: DC

## 2021-11-22 NOTE — Progress Notes (Signed)

## 2021-12-02 ENCOUNTER — Ambulatory Visit: Payer: 59

## 2021-12-02 ENCOUNTER — Ambulatory Visit
Admission: RE | Admit: 2021-12-02 | Discharge: 2021-12-02 | Disposition: A | Discharge status: Home / Self Care | Payer: 59 | Source: Ambulatory Visit | Attending: Family | Admitting: Family

## 2021-12-02 DIAGNOSIS — L309 Dermatitis, unspecified: Secondary | ICD-10-CM

## 2021-12-04 ENCOUNTER — Encounter: Payer: Self-pay | Admitting: Family

## 2021-12-05 ENCOUNTER — Ambulatory Visit (INDEPENDENT_AMBULATORY_CARE_PROVIDER_SITE_OTHER): Payer: 59 | Admitting: Family Medicine

## 2021-12-05 ENCOUNTER — Ambulatory Visit (INDEPENDENT_AMBULATORY_CARE_PROVIDER_SITE_OTHER): Payer: 59

## 2021-12-05 VITALS — BP 138/88 | HR 108 | Ht 68.0 in | Wt 322.8 lb

## 2021-12-05 DIAGNOSIS — R202 Paresthesia of skin: Secondary | ICD-10-CM

## 2021-12-05 DIAGNOSIS — M545 Low back pain, unspecified: Secondary | ICD-10-CM

## 2021-12-05 DIAGNOSIS — G8929 Other chronic pain: Secondary | ICD-10-CM

## 2021-12-05 DIAGNOSIS — R258 Other abnormal involuntary movements: Secondary | ICD-10-CM

## 2021-12-05 MED ORDER — GABAPENTIN 300 MG PO CAPS
300.0000 mg | ORAL_CAPSULE | Freq: Three times a day (TID) | ORAL | 1 refills | Status: DC | PRN
Start: 2021-12-05 — End: 2022-01-31

## 2021-12-05 MED ORDER — PREDNISONE 50 MG PO TABS: 50.0000 mg | ORAL_TABLET | Freq: Every day | ORAL | 0 refills | Status: DC

## 2021-12-05 NOTE — Patient Instructions (Addendum)
Thank you for coming in today.  ° °Please get an Xray today before you leave  ° °You should hear from MRI scheduling within 1 week. If you do not hear please let me know.   ° °Recheck back after MRI °

## 2021-12-05 NOTE — Progress Notes (Signed)
? ?I, Peterson Lombard, LAT, ATC acting as a scribe for Lynne Leader, MD. ? ?Joshua Parrish is a 46 y.o. male who presents to [place] [facility] today for low back pain. Pt was previously seen by Dr. Georgina Snell on 07/19/20 for neck pain. Today, pt report LBP ongoing for 3 months and on 3/22 moved some boxes. Pt locates pain to midline of the low back. Pt notes if he sits odd, his fingers will go numb.  ?He reports that his legs bilaterally and his left upper extremity and occasionally his right upper extremity will shake and twitch frequently.  This is getting to the point where he is having trouble working at times.  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 ? ?No bowel or bladder dysfunction. ? ?Radiating pain: no ?LE numbness/tingling: no- but bilat leg "twitching" ?LE weakness: no ?Aggravates: forward trunk flexion, sitting, transitioning to stand ?Treatments tried: TENS, heat, ice,  ? ?Pertinent review of systems: No fevers or chills ? ?Relevant historical information: Hypertension.  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 ? ?Exam:  ?BP 138/88   Pulse (!) 108   Ht '5\' 8"'$  (1.727 m)   Wt (!) 322 lb 12.8 oz (146.4 kg)   SpO2 97%   BMI 49.08 kg/m?  ?General: Well Developed, well nourished, and in no acute distress.  ? ?MSK: C-spine: Nontender midline.  Reduced cervical motion. ?Upper extremity strength is intact. ?Reflexes are increased bilaterally. ?Strength is intact. ?Sensation is decreased to light touch left index finger. ?Clonus left upper extremity present with exam. ? ?T-spine: Palpation at around T12-L1 region midline. ?Decreased thoracic motion. ? ?L-spine: Tender palpation around T12-L1 region midline. ?Decreased lumbar motion.  Lumbar flexion prompts bilateral lower extremity clonus left worse than right. ?Lower extremity strength is intact however. ?Reflexes are dramatically increased bilateral lower extremity. ?Profound  lower extremity clonus present bilaterally left greater than right. ?Gait is intact. ? ? ?Lab and Radiology Results ? ?X-ray images C-spine, T-spine, and L-spine obtained today personally and independently interpreted ? ?C-spine: No acute fractures are visible. ?Intact.  ACDF at C6-7. ? ?T-spine: No acute fracture visible.  Incomplete penetration on the lateral view. ? ?L-spine: No acute fractures visible. ?DDD L5-S1 present. ? ?Await formal radiology review ? ? ? ? ?Assessment and Plan: ?46 y.o. male with profound lower extremity clonus with moderate left upper extremity clonus in the setting of back pain and paresthesias.  This reportedly has been ongoing and worsening over the last year.  He does have a history of a cervical spine ACDF at C6-7.  His physical exam is immediately concerning for upper motor neuron injury or compression.  His symptoms occurring simultaneously in his upper and lower extremities would suggest that his injury or compression is likely to occur in his cervical spine despite his back pain being around the T12-L1 region. ? ?Plan for oral prednisone and gabapentin today.  Plan for x-ray of the entire spine and MRI of the cervical spine.  If cervical spine MRI is nondiagnostic we will proceed with thoracic and the lumbar MRI and then finally brain MRI. ? ? ?PDMP not reviewed this encounter. ?Orders Placed This Encounter  ?Procedures  ? DG Lumbar Spine 2-3 Views  ?  Standing Status:   Future  ?  Standing Expiration Date:   01/04/2022  ?  Order Specific Question:   Reason for Exam (SYMPTOM  OR DIAGNOSIS REQUIRED)  ?  Answer:   low back pain  ?  Order Specific Question:   Preferred imaging location?  ?  Answer:   [facility]  ? DG Cervical Spine Complete  ?  Order Specific Question:   Reason for Exam (SYMPTOM  OR DIAGNOSIS REQUIRED)  ?  Answer:   neck pain  ?  Order Specific Question:   Preferred imaging location?  ?  Answer:   [facility]  ? DG Thoracic Spine W/Swimmers  ?   Standing Status:   Future  ?  Standing Expiration Date:   12/06/2022  ?  Order Specific Question:   Reason for Exam (SYMPTOM  OR DIAGNOSIS REQUIRED)  ?  Answer:   thoracic back pain  ?  Order Specific Question:   Preferred imaging location?  ?  Answer:   [facility]  ? MR CERVICAL SPINE WO CONTRAST  ?  Standing Status:   Future  ?  Standing Expiration Date:   01/04/2022  ?  Order Specific Question:   What is the patient's sedation requirement?  ?  Answer:   No Sedation  ?  Order Specific Question:   Does the patient have a pacemaker or implanted devices?  ?  Answer:   No  ?  Order Specific Question:   Preferred imaging location?  ?  Answer:   [facility] (table limit-350lbs)  ? ?Meds ordered this encounter  ?Medications  ? gabapentin (NEURONTIN) 300 MG capsule  ?  Sig: Take 1-2 capsules (300-600 mg total) by mouth 3 (three) times daily as needed.  ?  Dispense:  180 capsule  ?  Refill:  1  ? predniSONE (DELTASONE) 50 MG tablet  ?  Sig: Take 1 tablet (50 mg total) by mouth daily.  ?  Dispense:  5 tablet  ?  Refill:  0  ? ? ? ?Discussed warning signs or symptoms. Please see discharge instructions. Patient expresses understanding. ? ? ?The above documentation has been reviewed and is accurate and complete Lynne Leader, M.D. ? ? ?

## 2021-12-10 ENCOUNTER — Ambulatory Visit (INDEPENDENT_AMBULATORY_CARE_PROVIDER_SITE_OTHER): Payer: 59

## 2021-12-10 DIAGNOSIS — R202 Paresthesia of skin: Secondary | ICD-10-CM

## 2021-12-10 DIAGNOSIS — R258 Other abnormal involuntary movements: Secondary | ICD-10-CM | POA: Diagnosis not present

## 2021-12-10 NOTE — Progress Notes (Signed)
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  These are rated as mild.

## 2021-12-10 NOTE — Progress Notes (Signed)
Thoracic spine x-ray looks normal to radiology.

## 2021-12-10 NOTE — Progress Notes (Signed)
Cervical spine x-ray shows a fusion at C6-7 and a little bit of arthritis at C5-6.  Otherwise the x-ray looks okay.

## 2021-12-11 ENCOUNTER — Encounter: Payer: Self-pay | Admitting: Family Medicine

## 2021-12-11 ENCOUNTER — Telehealth: Payer: Self-pay | Admitting: Family Medicine

## 2021-12-11 DIAGNOSIS — R258 Other abnormal involuntary movements: Secondary | ICD-10-CM

## 2021-12-11 DIAGNOSIS — R202 Paresthesia of skin: Secondary | ICD-10-CM

## 2021-12-11 NOTE — Progress Notes (Signed)
MRI cervical spine shows good appearing level where you had surgery at C6-7.  There is a disc bulge and a little bit at C5-6 however that does not look to be bad enough to cause your symptoms. ?I have ordered a thoracic spine MRI to help figure this out.

## 2021-12-11 NOTE — Telephone Encounter (Signed)
Thoracic spine MRI ordered ?

## 2021-12-14 ENCOUNTER — Other Ambulatory Visit: Payer: 59

## 2021-12-15 ENCOUNTER — Ambulatory Visit (INDEPENDENT_AMBULATORY_CARE_PROVIDER_SITE_OTHER): Payer: 59

## 2021-12-15 DIAGNOSIS — R258 Other abnormal involuntary movements: Secondary | ICD-10-CM | POA: Diagnosis not present

## 2021-12-15 DIAGNOSIS — R202 Paresthesia of skin: Secondary | ICD-10-CM | POA: Diagnosis not present

## 2021-12-17 ENCOUNTER — Telehealth: Payer: Self-pay | Admitting: Family Medicine

## 2021-12-17 ENCOUNTER — Ambulatory Visit: Payer: 59 | Admitting: Family

## 2021-12-17 DIAGNOSIS — R202 Paresthesia of skin: Secondary | ICD-10-CM

## 2021-12-17 DIAGNOSIS — G8929 Other chronic pain: Secondary | ICD-10-CM

## 2021-12-17 DIAGNOSIS — R258 Other abnormal involuntary movements: Secondary | ICD-10-CM

## 2021-12-17 NOTE — Progress Notes (Signed)
Thoracic spine MRI does not explain your symptoms. ? ?After our discussion last week I have ordered the lumbar spine MRI and the brain MRI at the same time.  You should hear soon about scheduling that.  That should give [place] an answer.

## 2021-12-17 NOTE — Telephone Encounter (Signed)
Brain and lumbar MRI ?

## 2021-12-21 ENCOUNTER — Ambulatory Visit (INDEPENDENT_AMBULATORY_CARE_PROVIDER_SITE_OTHER): Payer: 59

## 2021-12-21 DIAGNOSIS — G8929 Other chronic pain: Secondary | ICD-10-CM

## 2021-12-21 DIAGNOSIS — R202 Paresthesia of skin: Secondary | ICD-10-CM

## 2021-12-21 DIAGNOSIS — R258 Other abnormal involuntary movements: Secondary | ICD-10-CM | POA: Diagnosis not present

## 2021-12-21 DIAGNOSIS — M545 Low back pain, unspecified: Secondary | ICD-10-CM | POA: Diagnosis not present

## 2021-12-23 ENCOUNTER — Encounter: Payer: Self-pay | Admitting: Neurology

## 2021-12-23 ENCOUNTER — Telehealth: Payer: Self-pay | Admitting: Family Medicine

## 2021-12-23 DIAGNOSIS — R258 Other abnormal involuntary movements: Secondary | ICD-10-CM

## 2021-12-23 DIAGNOSIS — R202 Paresthesia of skin: Secondary | ICD-10-CM

## 2021-12-23 NOTE — Progress Notes (Signed)
MRI brain was normal.  MRI lumbar spine shows some arthritis and some pinched nerves in your low back that could cause some back pain but not the severe leg shaking that you had in clinic.  I have referred you to neurology to help me figure out what is wrong.  Recommend that you return to clinic to go over all of the results that we have so far and talk about what we can do next.

## 2021-12-23 NOTE — Progress Notes (Signed)
Brain MRI was normal. ?You do have some sinus irritation it looks like my guess is probably from allergies. ?That is not causing your leg shaking.

## 2021-12-23 NOTE — Telephone Encounter (Signed)
Referral to neurology

## 2021-12-25 ENCOUNTER — Ambulatory Visit (INDEPENDENT_AMBULATORY_CARE_PROVIDER_SITE_OTHER): Payer: 59 | Admitting: Family Medicine

## 2021-12-25 VITALS — BP 142/78 | HR 108 | Ht 68.0 in | Wt 329.8 lb

## 2021-12-25 DIAGNOSIS — R258 Other abnormal involuntary movements: Secondary | ICD-10-CM | POA: Diagnosis not present

## 2021-12-25 DIAGNOSIS — M5416 Radiculopathy, lumbar region: Secondary | ICD-10-CM

## 2021-12-25 LAB — CBC WITH DIFFERENTIAL/PLATELET
Basophils Absolute: 0 10*3/uL (ref 0.0–0.1)
Basophils Relative: 0.3 % (ref 0.0–3.0)
Eosinophils Absolute: 0.1 10*3/uL (ref 0.0–0.7)
Eosinophils Relative: 0.9 % (ref 0.0–5.0)
HCT: 38.9 % — ABNORMAL LOW (ref 39.0–52.0)
Hemoglobin: 13 g/dL (ref 13.0–17.0)
Lymphocytes Relative: 32.1 % (ref 12.0–46.0)
Lymphs Abs: 2.3 10*3/uL (ref 0.7–4.0)
MCHC: 33.4 g/dL (ref 30.0–36.0)
MCV: 89.8 fl (ref 78.0–100.0)
Monocytes Absolute: 0.5 10*3/uL (ref 0.1–1.0)
Monocytes Relative: 6.6 % (ref 3.0–12.0)
Neutro Abs: 4.3 10*3/uL (ref 1.4–7.7)
Neutrophils Relative %: 60.1 % (ref 43.0–77.0)
Platelets: 315 10*3/uL (ref 150.0–400.0)
RBC: 4.34 Mil/uL (ref 4.22–5.81)
RDW: 14.7 % (ref 11.5–15.5)
WBC: 7.1 10*3/uL (ref 4.0–10.5)

## 2021-12-25 LAB — TSH: TSH: 1.48 u[IU]/mL (ref 0.35–5.50)

## 2021-12-25 MED ORDER — HYDROCODONE-ACETAMINOPHEN 5-325 MG PO TABS: 1.0000 | ORAL_TABLET | Freq: Four times a day (QID) | ORAL | 0 refills | Status: DC | PRN

## 2021-12-25 NOTE — Progress Notes (Signed)
? ?I, Peterson Lombard, LAT, ATC acting as a scribe for Lynne Leader, MD. ? ?Joshua Parrish is a 46 y.o. male who presents to [place] at [facility] today for f/u profound lower extremity clonus with moderate left upper extremity clonus in the setting of back pain and paresthesias, concerning for upper motor neuron injury or compression, and MRI review. Pt has a hx of a cervical spine ACDF at C6-7. Pt was last seen by Dr. Georgina Snell on 12/05/21 and was prescribed gabapentin, prednisone, and back XR were obtained. Additional imaging was then obtained. Today, pt reports pain has continued and is more frequent. Pt notes "shaking" in the L leg w/ increased activity. Pt has started to experience "cold chills" in his L leg for past week. Pt will start the testosterone tonight. ? ?Patient notes that he has scheduled an appointment with his neurosurgeon Ronnald Ramp) for next Thursday, May 6. ? ?He also has been seeing his urologist who he reports has identified hematuria and has a CT scheduled for Monday, May 1 at [facility] urology ? ?Dx imaging: 12/21/21 L-spine & brain MRI ? 12/15/21 T-spine MRI ? 12/10/21 C-spine MRI ? 12/05/21 C-spine, T-spine, & L-spine XR ? ?Pertinent review of systems: No fevers or chills ? ?Relevant historical information: History of prostatitis.  Hypertension. ? ? ?Exam:  ?BP (!) 142/78   Pulse (!) 108   Ht '5\' 8"'$  (1.727 m)   Wt (!) 329 lb 12.8 oz (149.6 kg)   SpO2 97%   BMI 50.15 kg/m?  ?General: Well Developed, well nourished, and in no acute distress.  ? ?MSK: L-spine: Nontender midline.  Tender palpation lumbar paraspinal musculature. ?Decreased lumbar motion. ?Lower extremity strength is intact.  Clonus still present. ? ? ? ?Lab and Radiology Results ? ?EXAM: ?MRI CERVICAL SPINE WITHOUT CONTRAST ?  ?TECHNIQUE: ?Multiplanar, multisequence MR imaging of the cervical spine was ?performed. No intravenous contrast was administered. ?  ?COMPARISON:  Cervical spine radiographs 12/05/2021. Cervical  spine ?MRI 05/17/2016. ?  ?FINDINGS: ?Alignment: Straightening of the expected cervical lordosis. No ?significant spondylolisthesis. ?  ?Vertebrae: Vertebral body height is maintained. Susceptibility ?artifact arising from ACDF hardware at C6-C7. No appreciable ?significant marrow edema or focal suspicious osseous lesion. ?  ?Cord: No signal abnormality identified within the cervical spinal ?cord. ?  ?Posterior Fossa, vertebral arteries, paraspinal tissues: No ?abnormality identified within included portions of the posterior ?fossa. Flow voids preserved within the imaged cervical vertebral ?arteries. Paraspinal soft tissues unremarkable. ?  ?Disc levels: ?  ?Unless otherwise stated, the level by level findings below have not ?significantly changed from the prior MRI of 05/17/2016. ?  ?Mild disc degeneration at the non-operative levels, greatest at ?C5-C6. ?  ?C2-C3: No significant disc herniation or stenosis. ?  ?C3-C4: No significant disc herniation or stenosis. ?  ?C4-C5: Tiny central disc protrusion. No significant spinal canal or ?foraminal stenosis. ?  ?C5-C6: Small central disc protrusion. The disc protrusion focally ?effaces the ventral thecal sac, contacting and mildly flattening the ?ventral aspect of the spinal cord. However, the dorsal CSF space is ?maintained within the spinal canal. No significant foraminal ?stenosis. ?  ?C6-C7: Prior ACDF. No significant spinal canal or foraminal ?stenosis. ?  ?C7-T1: No significant disc herniation or stenosis. ?  ?IMPRESSION: ?No significant change from the prior cervical spine MRI of ?05/17/2016. ?  ?Prior C6-C7 ACDF. No significant spinal canal or foraminal stenosis ?at this level. ?  ?Cervical spondylosis at the remaining levels, as outlined and with ?findings most notably  as follows. ?  ?At C5-C6, a small central disc protrusion focally effaces the ?ventral thecal sac, contacting and mildly flattening the ventral ?aspect of the spinal cord. However, the dorsal CSF  space is ?maintained within the spinal canal. ?  ?Nonspecific straightening of the expected cervical lordosis. ?  ?  ?Electronically Signed ?  By: Kellie Simmering D.O. ?  On: 12/10/2021 07:55 ?  ?EXAM: ?MRI THORACIC SPINE WITHOUT CONTRAST ?  ?TECHNIQUE: ?Multiplanar, multisequence MR imaging of the thoracic spine was ?performed. No intravenous contrast was administered. ?  ?COMPARISON:  Prior radiograph from 12/05/2021. ?  ?FINDINGS: ?Alignment: Physiologic with preservation of the normal thoracic ?kyphosis. No listhesis. ?  ?Vertebrae: Vertebral body height maintained without acute or chronic ?fracture. Bone marrow signal intensity diffusely decreased on T1 ?weighted sequence, nonspecific, but most commonly related to anemia, ?smoking or obesity. Few scattered subcentimeter benign hemangiomata ?noted. No worrisome osseous lesions or abnormal marrow edema. ?  ?Cord:  Normal signal and morphology. ?  ?Paraspinal and other soft tissues: Paraspinous soft tissues within ?normal limits. ?  ?Disc levels: ?  ?Sequelae of prior ACDF partially visualized within the lower ?cervical spine. ?  ?T6-7: Minimal disc bulge. No significant spinal stenosis or neural ?impingement. Foramina remain patent. ?  ?T7-8: Minimal disc bulge. No spinal stenosis. Foramina remain ?patent. ?  ?Otherwise, no other significant disc pathology seen elsewhere within ?the thoracic spine. No other focal disc protrusion. No spinal ?stenosis. Foramina remain patent. ?  ?IMPRESSION: ?1. Minor noncompressive disc bulging at T6-7 and T7-8 without ?stenosis or neural impingement. ?2. Otherwise unremarkable and normal MRI of the thoracic spine and ?spinal cord. No significant stenosis or evidence for neural ?impingement. ?  ?  ?Electronically Signed ?  By: Jeannine Boga M.D. ?  On: 12/16/2021 05:22 ?  ?EXAM: ?MRI HEAD WITHOUT CONTRAST ?  ?TECHNIQUE: ?Multiplanar, multiecho pulse sequences of the brain and surrounding ?structures were obtained without  intravenous contrast. ?  ?COMPARISON:  Head CT 04/01/2013. ?  ?FINDINGS: ?Mild intermittent motion degradation. ?  ?Brain: ?  ?No age advanced or lobar predominant parenchymal atrophy. ?  ?No cortical encephalomalacia is identified. No significant cerebral ?white matter disease. ?  ?There is no acute infarct. ?  ?No evidence of an intracranial mass. ?  ?No chronic intracranial blood products. ?  ?No extra-axial fluid collection. ?  ?No midline shift. ?  ?Vascular: Maintained flow voids within the proximal large arterial ?vessels. ?  ?Skull and upper cervical spine: No focal suspicious marrow lesion. ?  ?Sinuses/Orbits: Visualized orbits show no acute finding. Trace ?mucosal thickening within the bilateral ethmoid sinuses. Large right ?mastoid effusion. Small-volume fluid also present within the left ?mastoid air cells. ?  ?IMPRESSION: ?Unremarkable non-contrast MRI appearance of the brain for age. No ?evidence of acute intracranial abnormality. ?  ?Minimal mucosal thickening within the bilateral ethmoid sinuses. ?  ?Large right mastoid effusion. Small-volume fluid also present within ?the left mastoid air cells. ?  ?  ?Electronically Signed ?  By: Kellie Simmering D.O. ?  On: 12/21/2021 13:54 ? EXAM: ?MRI LUMBAR SPINE WITHOUT CONTRAST ?  ?TECHNIQUE: ?Multiplanar, multisequence MR imaging of the lumbar spine was ?performed. No intravenous contrast was administered. ?  ?COMPARISON:  05/17/2016 ?  ?FINDINGS: ?Segmentation:  Standard ?  ?Alignment:  Normal ?  ?Vertebrae:  No fracture, evidence of discitis, or bone lesion. ?  ?Conus medullaris and cauda equina: Conus extends to the L1 level. ?Conus and cauda equina appear normal. ?  ?Paraspinal and  other soft tissues: Negative ?  ?Disc levels: ?  ?L1-L2: Normal disc space and facet joints. No spinal canal stenosis. ?No neural foraminal stenosis. ?  ?L2-L3: Unchanged small central disc extrusion with inferior ?migration. Mild spinal canal stenosis. No neural foraminal  stenosis. ?  ?L3-L4: Small disc bulge with endplate spurring. No spinal canal ?stenosis. No neural foraminal stenosis. ?  ?L4-L5: Small right asymmetric disc bulge. No spinal canal stenosis. ?Mild right and no left ne

## 2021-12-25 NOTE — Patient Instructions (Addendum)
Thank you for coming in today.  ? ?Please call [facility] at [phone] to schedule your spine injection.   ? ?Please get labs today before you leave  ? ?Let me know what Dr Ronnald Ramp says.  ? ?Let me know when you get the CT scan results back.  ? ? ? ? ?

## 2021-12-26 ENCOUNTER — Telehealth: Payer: Self-pay | Admitting: Family Medicine

## 2021-12-26 DIAGNOSIS — R258 Other abnormal involuntary movements: Secondary | ICD-10-CM

## 2021-12-26 NOTE — Telephone Encounter (Signed)
New referral to [facility].  ?He is already established there.  ? ? ?

## 2021-12-27 NOTE — Progress Notes (Signed)
Labs are normal so far.  Other labs are still pending

## 2022-01-06 ENCOUNTER — Ambulatory Visit
Admission: RE | Admit: 2022-01-06 | Discharge: 2022-01-06 | Disposition: A | Discharge status: Home / Self Care | Payer: 59 | Source: Ambulatory Visit | Attending: Family Medicine | Admitting: Family Medicine

## 2022-01-06 DIAGNOSIS — M5416 Radiculopathy, lumbar region: Secondary | ICD-10-CM

## 2022-01-06 MED ORDER — METHYLPREDNISOLONE ACETATE 40 MG/ML INJ SUSP (RADIOLOG
80.0000 mg | Freq: Once | INTRAMUSCULAR | Status: AC
  Administered 2022-01-06: 80 mg via EPIDURAL

## 2022-01-06 MED ORDER — IOPAMIDOL (ISOVUE-M 200) INJECTION 41%
1.0000 mL | Freq: Once | INTRAMUSCULAR | Status: AC
  Administered 2022-01-06: 1 mL via EPIDURAL

## 2022-01-06 NOTE — Discharge Instructions (Signed)

## 2022-01-09 ENCOUNTER — Encounter: Payer: Self-pay | Admitting: Neurology

## 2022-01-09 ENCOUNTER — Ambulatory Visit: Payer: 59 | Admitting: Neurology

## 2022-01-09 VITALS — BP 132/76 | HR 86 | Ht 69.0 in | Wt 327.0 lb

## 2022-01-09 DIAGNOSIS — G4733 Obstructive sleep apnea (adult) (pediatric): Secondary | ICD-10-CM | POA: Diagnosis not present

## 2022-01-09 DIAGNOSIS — Z9989 Dependence on other enabling machines and devices: Secondary | ICD-10-CM

## 2022-01-09 DIAGNOSIS — Z6841 Body Mass Index (BMI) 40.0 and over, adult: Secondary | ICD-10-CM

## 2022-01-09 DIAGNOSIS — R269 Unspecified abnormalities of gait and mobility: Secondary | ICD-10-CM | POA: Diagnosis not present

## 2022-01-09 DIAGNOSIS — M545 Low back pain, unspecified: Secondary | ICD-10-CM

## 2022-01-09 DIAGNOSIS — G253 Myoclonus: Secondary | ICD-10-CM | POA: Insufficient documentation

## 2022-01-09 MED ORDER — LAMOTRIGINE 100 MG PO TABS: 100.0000 mg | ORAL_TABLET | Freq: Two times a day (BID) | ORAL | 11 refills | Status: DC

## 2022-01-09 MED ORDER — LAMOTRIGINE 25 MG PO TABS: ORAL_TABLET | ORAL | 0 refills | Status: DC

## 2022-01-09 NOTE — Progress Notes (Signed)
? ?Chief Complaint  ?Patient presents with  ? New Patient (Initial Visit)  ?  Rm 17. Alone. ?NX Dr. Margret Chance Feb (sleep only)/internal referral for clonus.  ? ? ? ? ?ASSESSMENT AND PLAN ? ?Joshua Parrish is a 46 y.o. male   ?History of cervical decompression surgery ?Recurrent neck pain, shoulder pain, left more than right upper and lower extremity myoclonic jerking movement, ? Patient has variable effort on examination, somewhat volunteer control of bilateral extremity jerking movement, ? MRI of neuraxis failed to demonstrate structure etiology ? Laboratory evaluations including B12, copper level, ? EEG ? Empirically treat with lamotrigine titrating to 100 mg twice a day ? Return to clinic with nurse practitioner in 3 months ? ?DIAGNOSTIC DATA (LABS, IMAGING, TESTING) ?- I reviewed patient records, labs, notes, testing and imaging myself where available. ? ? ?MEDICAL HISTORY: ? ?SPIKE Parrish is a 46 year old male, with seen in request by primary doctor Gregor Hams, for evaluation of leg jerking movement, initial evaluation was on Jan 09, 2022, ? ?I reviewed and summarized the referring note. PMHX. ?HTN ?Depression, anxiety ?Gout ?GERD ?Fatty Liver ?Obesity. ?History of cervical decompression ? ?Patient had long history of obesity, obstructive sleep apnea, seeing Dr. Rexene Alberts, last visit was in February 23 ? ?He had a history of cervical decompression surgery, reported sudden onset neck pain, radiating pain to left shoulder, left-sided weakness prior to surgical decompression, surgery did help him, ? ?He also had left shoulder arthroscopic surgery ? ?Since 2022, after carrying a big heavy box upstairs, he began to noticed intermittent left arm shaking, and if hold up his left leg, he also have left lower extremity shaking, improved by putting down the left arm and left leg, ? ?Similar involvement on the right arm and right lower extremity as well, but much less frequent, he works on [employer], 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, often  require prolonged walking, and heavy lifting, he complains of a year history of worsening low back pain after prolonged standing and walking," my lower back is killing me,", ? ?He also complains of worsening neck pain, radiating pain to left shoulder, and to his left dominant hand, ? ?He reported a history of abnormal brain scan as an infant, improved later, denies history of seizure, but during left hand and left jerking episode, sometimes he felt transient confusion, sweaty, last about 10 minutes ? ?I was able to witnessed multiple episode, during deep tendon reflex examination, he had both lower extremity high-frequency myoclonic jerking-like movement, but if he is extending his left leg, he would trigger similar abnormal left leg movement, also involving left arm, seems to have variable effort on examinations, ? ? ?Personally reviewed MRI of cervical spine in April 2023, evidence of C6-7 ACDF, mild degenerative changes no significant canal stenosis, no evidence of cord signal abnormality ?MRI of thoracic spine, mild degenerative changes, no cord compression ?MRI of lumbar spine, multilevel degenerative changes no significance canal or foraminal narrowing, ?MRI of brain, unremarkable ? ?Lab in April 23, normal CBC, TSH, ? ? ?PHYSICAL EXAM: ?  ?Vitals:  ? 01/09/22 0915  ?BP: 132/76  ?Pulse: 86  ?Weight: (!) 327 lb (148.3 kg)  ?Height: '5\' 9"'$  (1.753 m)  ? ?Not recorded ?  ? ? ?Body mass index is 48.29 kg/m?. ? ?PHYSICAL EXAMNIATION: ? ?Gen: NAD, conversant, well nourised, well groomed                     ?Cardiovascular: Regular rate rhythm, no peripheral edema, warm,  nontender. ?Eyes: Conjunctivae clear without exudates or hemorrhage ?Neck: Supple, no carotid bruits. ?Pulmonary: Clear to auscultation bilaterally  ? ?NEUROLOGICAL EXAM: ? ?MENTAL STATUS: ?Speech cognition: ?Awake, alert, oriented to history taking care of conversation ?  ?CRANIAL NERVES: ?CN II: Visual fields are full to confrontation. Pupils are  round equal and briskly reactive to light. ?CN III, IV, VI: extraocular movement are normal. No ptosis. ?CN V: Facial sensation is intact to light touch ?CN VII: Face is symmetric with normal eye closure  ?CN VIII: Hearing is normal to causal conversation. ?CN IX, X: Phonation is normal. ?CN XI: Head turning and shoulder shrug are intact ? ?MOTOR: Bilateral upper and lower extremity motor strength is normal, ? ?REFLEXES: ?Reflexes are 3 and symmetric at the biceps, triceps, knees, and ankles. Plantar responses are extensor bilaterally ? ?SENSORY: ?Intact to light touch, pinprick and vibratory sensation are intact in fingers and toes. ? ?COORDINATION: ?There is no trunk or limb dysmetria noted. ? ?GAIT/STANCE: Need push-up to get up from seated position, limited by his big body habitus ? ?REVIEW OF SYSTEMS:  ?Full 14 system review of systems performed and notable only for as above ?All other review of systems were negative. ? ? ?ALLERGIES: ?Allergies  ?Allergen Reactions  ? Food Diarrhea and Nausea And Vomiting  ?  COCONUT  ? Lisinopril Cough  ? ? ?HOME MEDICATIONS: ?Current Outpatient Medications  ?Medication Sig Dispense Refill  ? allopurinol (ZYLOPRIM) 300 MG tablet TAKE ONE TABLET BY MOUTH DAILY 90 tablet 1  ? amLODipine (NORVASC) 10 MG tablet TAKE ONE TABLET BY MOUTH DAILY 30 tablet 2  ? buPROPion (WELLBUTRIN XL) 150 MG 24 hr tablet TAKE ONE TABLET BY MOUTH DAILY 30 tablet 1  ? cyclobenzaprine (FLEXERIL) 10 MG tablet Take 1 tablet (10 mg total) by mouth 3 (three) times daily as needed for muscle spasms. 30 tablet 0  ? escitalopram (LEXAPRO) 20 MG tablet TAKE ONE TABLET BY MOUTH DAILY 90 tablet 1  ? gabapentin (NEURONTIN) 300 MG capsule Take 1-2 capsules (300-600 mg total) by mouth 3 (three) times daily as needed. 180 capsule 1  ? HYDROcodone-acetaminophen (NORCO/VICODIN) 5-325 MG tablet Take 1 tablet by mouth every 6 (six) hours as needed. 15 tablet 0  ? ipratropium (ATROVENT) 0.03 % nasal spray Place 2 sprays  into both nostrils every 12 (twelve) hours. 30 mL 0  ? methocarbamol (ROBAXIN) 500 MG tablet Take 1 tablet (500 mg total) by mouth at bedtime as needed. 30 tablet 0  ? naproxen (NAPROSYN) 500 MG tablet Take 1 tablet (500 mg total) by mouth 2 (two) times daily with a meal. 60 tablet 0  ? nystatin-triamcinolone (MYCOLOG II) cream Apply 1 application topically 2 (two) times daily. 30 g 0  ? omeprazole (PRILOSEC) 40 MG capsule Take 1 capsule (40 mg total) by mouth daily. 90 capsule 3  ? spironolactone (ALDACTONE) 25 MG tablet TAKE ONE TABLET BY MOUTH DAILY 30 tablet 2  ? triamcinolone cream (KENALOG) 0.1 % Apply 1 application topically 2 (two) times daily. 30 g 0  ? valsartan-hydrochlorothiazide (DIOVAN-HCT) 320-25 MG tablet TAKE ONE TABLET BY MOUTH DAILY 90 tablet 1  ? ?No current facility-administered medications for this visit.  ? ? ?PAST MEDICAL HISTORY: ?Past Medical History:  ?Diagnosis Date  ? Chicken pox   ? Depression   ? Fatty liver   ? GERD (gastroesophageal reflux disease)   ? Migraines   ? Osteoarthritis of shoulder   ? left  ? Rotator cuff impingement syndrome of left  shoulder 06/19/2015  ? Sinusitis   ? ? ?PAST SURGICAL HISTORY: ?Past Surgical History:  ?Procedure Laterality Date  ? APPENDECTOMY    ? CERVICAL LAMINECTOMY    ? has plates and fusion  ? CHOLECYSTECTOMY N/A 12/24/2015  ? Procedure: LAPAROSCOPIC CHOLECYSTECTOMY ;  Surgeon: [name] Pickerel, MD;  Location: [facility];  Service: General;  Laterality: N/A;  ? CLAVICLE EXCISION Left   ? HYPOSPADIAS CORRECTION    ? as child  ? KNEE SURGERY Bilateral   ? multiple  ? SHOULDER ARTHROSCOPY WITH ROTATOR CUFF REPAIR Left 06/26/2015  ? Procedure: SHOULDER ARTHROSCOPY WITH ROTATOR CUFF REPAIR;  Surgeon: Elsie Saas, MD;  Location: [place];  Service: Orthopedics;  Laterality: Left;  ? ? ?FAMILY HISTORY: ?Family History  ?Problem Relation Age of Onset  ? Arthritis Mother   ? Ovarian cancer Mother   ? Alcohol abuse Father   ? Breast cancer Maternal  Uncle   ? Breast cancer Maternal Grandmother   ? Cancer Maternal Grandmother   ?     spinal surgery  ? Cancer Other   ? Hypertension Other   ? Diabetes Other   ? Breast cancer Maternal Great-grandfather   ?

## 2022-01-13 ENCOUNTER — Other Ambulatory Visit: Payer: Self-pay | Admitting: Family

## 2022-01-13 ENCOUNTER — Encounter: Payer: Self-pay | Admitting: Family

## 2022-01-13 ENCOUNTER — Encounter: Payer: Self-pay | Admitting: Neurology

## 2022-01-13 NOTE — Progress Notes (Signed)
Sedimentation rate is mildly elevated.

## 2022-01-14 LAB — ANA W/REFLEX IF POSITIVE
Anti JO-1: <0.2 AI (ref 0.0–0.9)
Anti Nuclear Antibody (ANA): POSITIVE — AB
Centromere Ab Screen: <0.2 AI (ref 0.0–0.9)
Chromatin Ab SerPl-aCnc: <0.2 AI (ref 0.0–0.9)
ENA RNP Ab: <0.2 AI (ref 0.0–0.9)
ENA SM Ab Ser-aCnc: <0.2 AI (ref 0.0–0.9)
ENA SSA (RO) Ab: <0.2 AI (ref 0.0–0.9)
ENA SSB (LA) Ab: <0.2 AI (ref 0.0–0.9)
Scleroderma (Scl-70) (ENA) Antibody, IgG: <0.2 AI (ref 0.0–0.9)
dsDNA Ab: 10 IU/mL — ABNORMAL HIGH (ref 0–9)

## 2022-01-14 LAB — MULTIPLE MYELOMA PANEL, SERUM
Albumin SerPl Elph-Mcnc: 3.9 g/dL (ref 2.9–4.4)
Albumin/Glob SerPl: 1.4 (ref 0.7–1.7)
Alpha 1: 0.2 g/dL (ref 0.0–0.4)
Alpha2 Glob SerPl Elph-Mcnc: 0.7 g/dL (ref 0.4–1.0)
B-Globulin SerPl Elph-Mcnc: 1.1 g/dL (ref 0.7–1.3)
Gamma Glob SerPl Elph-Mcnc: 0.7 g/dL (ref 0.4–1.8)
Globulin, Total: 2.8 g/dL (ref 2.2–3.9)
IgA/Immunoglobulin A, Serum: 110 mg/dL (ref 90–386)
IgG (Immunoglobin G), Serum: 830 mg/dL (ref 603–1613)
IgM (Immunoglobulin M), Srm: 70 mg/dL (ref 20–172)
Total Protein: 6.7 g/dL (ref 6.0–8.5)

## 2022-01-14 LAB — VITAMIN D 25 HYDROXY (VIT D DEFICIENCY, FRACTURES): Vit D, 25-Hydroxy: 16 ng/mL — ABNORMAL LOW (ref 30.0–100.0)

## 2022-01-14 LAB — HGB A1C W/O EAG: Hgb A1c MFr Bld: 5.4 % (ref 4.8–5.6)

## 2022-01-14 LAB — RPR: RPR Ser Ql: NONREACTIVE

## 2022-01-14 LAB — SEDIMENTATION RATE: Sed Rate: 30 mm/hr — ABNORMAL HIGH (ref 0–15)

## 2022-01-14 LAB — GLUTAMIC ACID DECARBOXYLASE AUTO ABS: Glutamic Acid Decarb Ab: <5 U/mL (ref 0.0–5.0)

## 2022-01-14 LAB — HIV ANTIBODY (ROUTINE TESTING W REFLEX): HIV Screen 4th Generation wRfx: NONREACTIVE

## 2022-01-14 LAB — FERRITIN: Ferritin: 205 ng/mL (ref 30–400)

## 2022-01-14 LAB — HTLV I+II ANTIBODIES, (EIA), BLD: HTLV I/II Ab: NEGATIVE

## 2022-01-14 LAB — VITAMIN B12: Vitamin B-12: 529 pg/mL (ref 232–1245)

## 2022-01-14 LAB — COPPER, SERUM: Copper: 99 ug/dL (ref 69–132)

## 2022-01-14 LAB — C-REACTIVE PROTEIN: CRP: 3 mg/L (ref 0–10)

## 2022-01-14 LAB — CK: Total CK: 163 U/L (ref 49–439)

## 2022-01-14 LAB — FOLATE: Folate: 6.7 ng/mL (ref >3.0)

## 2022-01-20 ENCOUNTER — Ambulatory Visit: Payer: 59 | Admitting: Neurology

## 2022-01-20 DIAGNOSIS — R269 Unspecified abnormalities of gait and mobility: Secondary | ICD-10-CM

## 2022-01-20 DIAGNOSIS — G253 Myoclonus: Secondary | ICD-10-CM

## 2022-01-20 DIAGNOSIS — R0681 Apnea, not elsewhere classified: Secondary | ICD-10-CM

## 2022-01-20 DIAGNOSIS — G4733 Obstructive sleep apnea (adult) (pediatric): Secondary | ICD-10-CM

## 2022-01-20 NOTE — Procedures (Signed)
   HISTORY: 46 year old male with history of cervical decompression surgery, presented with intermittent limb muscle jerking movement  TECHNIQUE:  This is a routine 16 channel EEG recording with one channel devoted to a limited EKG recording.  It was performed during wakefulness, drowsiness and asleep.  Hyperventilation and photic stimulation were performed as activating procedures.  There are minimum muscle and movement artifact noted.  Upon maximum arousal, posterior dominant waking rhythm consistent of rhythmic alpha range activity, Activities are symmetric over the bilateral posterior derivations and attenuated with eye opening.  Hyperventilation produced mild/moderate buildup with higher amplitude and the slower activities noted.  Photic stimulation did not alter the tracing.  During EEG recording, patient developed drowsiness and no deeper stage of sleep was achieved.  During EEG recording, there was no epileptiform discharge noted.  EKG demonstrate sinus rhythm, with heart rate of  CONCLUSION: This is a  normal awake EEG.  There is no electrodiagnostic evidence of epileptiform discharge.  Marcial Pacas, M.D. Ph.D.  [facility] [place], [place] [address] Phone: [phone] Fax:      [phone]

## 2022-01-29 ENCOUNTER — Other Ambulatory Visit: Payer: Self-pay | Admitting: Neurology

## 2022-01-29 ENCOUNTER — Encounter: Payer: Self-pay | Admitting: Family Medicine

## 2022-01-29 DIAGNOSIS — M5416 Radiculopathy, lumbar region: Secondary | ICD-10-CM

## 2022-01-31 ENCOUNTER — Other Ambulatory Visit: Payer: Self-pay | Admitting: Family Medicine

## 2022-01-31 NOTE — Telephone Encounter (Signed)
Rx refill request approved per Dr. Corey's orders. 

## 2022-02-02 ENCOUNTER — Other Ambulatory Visit: Payer: Self-pay | Admitting: Family

## 2022-02-07 ENCOUNTER — Encounter: Payer: Self-pay | Admitting: Family

## 2022-02-07 ENCOUNTER — Ambulatory Visit (INDEPENDENT_AMBULATORY_CARE_PROVIDER_SITE_OTHER): Payer: 59 | Admitting: Family

## 2022-02-07 VITALS — BP 136/84 | HR 95 | Temp 98.0°F | Resp 18 | Ht 69.0 in | Wt 327.4 lb

## 2022-02-07 DIAGNOSIS — Z6841 Body Mass Index (BMI) 40.0 and over, adult: Secondary | ICD-10-CM

## 2022-02-07 DIAGNOSIS — M545 Low back pain, unspecified: Secondary | ICD-10-CM | POA: Diagnosis not present

## 2022-02-07 DIAGNOSIS — R7989 Other specified abnormal findings of blood chemistry: Secondary | ICD-10-CM

## 2022-02-07 DIAGNOSIS — G8929 Other chronic pain: Secondary | ICD-10-CM

## 2022-02-07 DIAGNOSIS — I10 Essential (primary) hypertension: Secondary | ICD-10-CM

## 2022-02-07 MED ORDER — WEGOVY 0.25 MG/0.5ML ~~LOC~~ SOAJ
0.2500 mg | SUBCUTANEOUS | 0 refills | Status: DC
Start: 2022-02-07 — End: 2022-02-18

## 2022-02-07 NOTE — Progress Notes (Signed)
Joshua Parrish is a 46 y.o. 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   Low back pain 01/09/2022   Viral URI with cough 09/06/2018   Rotator cuff disorder, left 12/25/2017   Bilateral shoulder pain 05/22/2017   Left knee pain 05/22/2017   Urinary frequency 02/09/2017   Essential hypertension 01/16/2017   Prostatitis 12/25/2016   Sepsis ([place]) 12/25/2016   GERD (gastroesophageal reflux disease) 12/25/2016   Sinusitis 12/09/2016   Tendinitis of right rotator cuff 12/09/2016   Laryngopharyngeal reflux (LPR) 02/04/2016   Mass in neck 11/27/2015   Rotator cuff impingement syndrome of left shoulder 06/19/2015   Acromioclavicular joint arthritis 06/19/2015    Current Outpatient Medications  Medication Sig Dispense Refill   allopurinol (ZYLOPRIM) 300 MG tablet TAKE ONE TABLET BY MOUTH DAILY 90 tablet 1   amLODipine (NORVASC) 10 MG tablet TAKE ONE TABLET BY MOUTH DAILY 30 tablet 2   buPROPion (WELLBUTRIN XL) 150 MG 24 hr tablet TAKE ONE TABLET BY MOUTH DAILY 30 tablet 1   cyclobenzaprine (FLEXERIL) 10 MG tablet Take 1 tablet (10 mg total) by mouth 3 (three) times daily as needed for muscle spasms. 30 tablet 0   escitalopram (LEXAPRO) 20 MG tablet TAKE ONE TABLET BY MOUTH DAILY 90 tablet 1   gabapentin (NEURONTIN) 300 MG capsule TAKE ONE TO TWO CAPSULES BY MOUTH THREE TIMES A DAY AS NEEDED 180 capsule 1   HYDROcodone-acetaminophen (NORCO/VICODIN) 5-325 MG tablet Take 1 tablet by mouth every 6 (six) hours as needed. 15 tablet 0   ipratropium (ATROVENT) 0.03 % nasal spray Place 2 sprays into both nostrils every 12 (twelve) hours. 30 mL 0   lamoTRIgine (LAMICTAL) 100 MG tablet Take 1 tablet (100 mg total) by mouth 2 (two) times daily. 60 tablet 11   lamoTRIgine (LAMICTAL) 25 MG tablet 1 twice  a day x1wk 2 twice a day x 2nd wk 3 twice a day x3rd wk 84 tablet 0   methocarbamol (ROBAXIN) 500 MG tablet Take 1 tablet (500 mg total) by mouth at bedtime as needed. 30 tablet 0   naproxen (NAPROSYN) 500 MG tablet Take 1 tablet (500 mg total) by mouth 2 (two) times daily with a meal. 60 tablet 0   nystatin-triamcinolone (MYCOLOG II) cream Apply 1 application topically 2 (two) times daily. 30 g 0   omeprazole (PRILOSEC) 40 MG capsule Take 1 capsule (40 mg total) by mouth daily. 90 capsule 3   Semaglutide-Weight Management (WEGOVY) 0.25 MG/0.5ML SOAJ Inject 0.25 mg into the skin once a week. 2 mL 0   spironolactone (ALDACTONE) 25 MG tablet TAKE ONE TABLET BY MOUTH DAILY 30 tablet 2   triamcinolone cream (KENALOG) 0.1 % Apply 1 application topically 2 (two) times daily. 30 g 0   valsartan-hydrochlorothiazide (DIOVAN-HCT) 320-25 MG tablet TAKE ONE TABLET BY MOUTH DAILY 90 tablet 1   No current facility-administered medications for this visit.    Allergies: Food and Lisinopril  Past Medical History:  Diagnosis Date   Chicken pox    Depression    Fatty liver    GERD (gastroesophageal reflux disease)    Migraines    Osteoarthritis of shoulder    left   Rotator cuff impingement syndrome of left shoulder 06/19/2015   Sinusitis     Past Surgical  History:  Procedure Laterality Date   APPENDECTOMY     CERVICAL LAMINECTOMY     has plates and fusion   CHOLECYSTECTOMY N/A 12/24/2015   Procedure: LAPAROSCOPIC CHOLECYSTECTOMY ;  Surgeon: [name] Pickerel, MD;  Location: [facility];  Service: General;  Laterality: N/A;   CLAVICLE EXCISION Left    HYPOSPADIAS CORRECTION     as child   KNEE SURGERY Bilateral    multiple   SHOULDER ARTHROSCOPY WITH ROTATOR CUFF REPAIR Left 06/26/2015   Procedure: SHOULDER ARTHROSCOPY WITH ROTATOR CUFF REPAIR;  Surgeon: Elsie Saas, MD;  Location: [facility];  Service: Orthopedics;  Laterality: Left;    Family History  Problem Relation Age of Onset    Arthritis Mother    Ovarian cancer Mother    Alcohol abuse Father    Breast cancer Maternal Uncle    Breast cancer Maternal Grandmother    Cancer Maternal Grandmother        spinal surgery   Cancer Other    Hypertension Other    Diabetes Other    Breast cancer Maternal Great-grandfather    Colon cancer Neg Hx    Sleep apnea Neg Hx     Social History   Tobacco Use   Smoking status: Never   Smokeless tobacco: Current    Types: Chew   Tobacco comments:    form given 11/29/15  Substance Use Topics   Alcohol use: Yes    Alcohol/week: 0.0 standard drinks of alcohol    Comment: occasional    Subjective:  Follow up on chronic care needs; Working with neurology for clonus- no source found but good response to medication; Working with urology for low testosterone- will be seeing them in follow up next week; Scheduled for epidural steroid injection next week/ Dr. Georgina Snell sports medicine;  Frustrated that back pain is affecting ability to move/ limiting ability to lose weight; wonders about weight loss options;   Extensive labs done with neurology in May 2023;      Objective:  Vitals:   02/07/22 1535  BP: 136/84  Pulse: 95  Resp: 18  Temp: 98 F (36.7 C)  TempSrc: Oral  SpO2: 94%  Weight: (!) 327 lb 6 oz (148.5 kg)  Height: '5\' 9"'$  (1.753 m)    General: Well developed, well nourished, in no acute distress  Skin : Warm and dry.  Head: Normocephalic and atraumatic  Lungs: Respirations unlabored; clear to auscultation bilaterally without wheeze, rales, rhonchi  CVS exam: normal rate and regular rhythm.  Neurologic: Alert and oriented; speech intact; face symmetrical; moves all extremities well; CNII-XII intact without focal deficit   Assessment:  1. Morbid obesity with BMI of 45.0-49.9, adult ([place])   2. Low testosterone   3. Chronic low back pain, unspecified back pain laterality, unspecified whether sciatica present   4. Essential hypertension     Plan:  Rx for Wegovy-  risks/ benefit discussed; refer to [facility]; Keep planned follow up with urology; Keep appointment for epidural steroid injection; stressed benefit of PT;  Stable; continue same medications.  Time spent- 30 minutes  No follow-ups on file.  Orders Placed This Encounter  Procedures   Amb Ref to Medical Weight Management    Referral Priority:   Routine    Referral Type:   Consultation    Number of Visits Requested:   1    Requested Prescriptions   Signed Prescriptions Disp Refills   Semaglutide-Weight Management (WEGOVY) 0.25 MG/0.5ML SOAJ 2 mL 0  Sig: Inject 0.25 mg into the skin once a week.

## 2022-02-07 NOTE — Patient Instructions (Addendum)
Call your insurance company and ask about your deductible- my suspicion is that you are probably close to meeting your deductible which means PT would be free;

## 2022-02-10 ENCOUNTER — Encounter: Payer: Self-pay | Admitting: Family

## 2022-02-10 ENCOUNTER — Telehealth: Payer: Self-pay

## 2022-02-10 NOTE — Telephone Encounter (Signed)
PA initiated via Covermymeds; KEY:  [id number]. Awaiting determination.

## 2022-02-10 NOTE — Telephone Encounter (Signed)
PA denied.   The requested medication and/or diagnosis are not a covered benefit and excluded from coverage in accordance with the terms and conditions of your plan benefit. Therefore, the request has been administratively denied

## 2022-02-11 ENCOUNTER — Other Ambulatory Visit: Payer: Self-pay | Admitting: Family

## 2022-02-11 DIAGNOSIS — R160 Hepatomegaly, not elsewhere classified: Secondary | ICD-10-CM

## 2022-02-11 DIAGNOSIS — K4090 Unilateral inguinal hernia, without obstruction or gangrene, not specified as recurrent: Secondary | ICD-10-CM

## 2022-02-13 ENCOUNTER — Other Ambulatory Visit: Payer: 59

## 2022-02-13 ENCOUNTER — Ambulatory Visit
Admission: RE | Admit: 2022-02-13 | Discharge: 2022-02-13 | Disposition: A | Discharge status: Home / Self Care | Payer: 59 | Source: Ambulatory Visit | Attending: Family Medicine | Admitting: Family Medicine

## 2022-02-13 DIAGNOSIS — M5416 Radiculopathy, lumbar region: Secondary | ICD-10-CM

## 2022-02-13 MED ORDER — METHYLPREDNISOLONE ACETATE 40 MG/ML INJ SUSP (RADIOLOG
60.0000 mg | Freq: Once | INTRAMUSCULAR | Status: AC
  Administered 2022-02-13: 60 mg via EPIDURAL

## 2022-02-13 MED ORDER — IOPAMIDOL (ISOVUE-M 200) INJECTION 41%
1.0000 mL | Freq: Once | INTRAMUSCULAR | Status: AC
  Administered 2022-02-13: 1 mL via EPIDURAL

## 2022-02-13 NOTE — Discharge Instructions (Signed)

## 2022-02-16 ENCOUNTER — Observation Stay (HOSPITAL_COMMUNITY)
Admission: EM | Admit: 2022-02-16 | Discharge: 2022-02-18 | Disposition: A | Discharge status: Home / Self Care | Payer: 59 | Attending: Internal Medicine | Admitting: Internal Medicine

## 2022-02-16 ENCOUNTER — Emergency Department (HOSPITAL_COMMUNITY): Payer: 59

## 2022-02-16 ENCOUNTER — Encounter (HOSPITAL_COMMUNITY): Payer: Self-pay | Admitting: Radiology

## 2022-02-16 ENCOUNTER — Other Ambulatory Visit: Payer: Self-pay

## 2022-02-16 ENCOUNTER — Other Ambulatory Visit: Payer: Self-pay | Admitting: Family

## 2022-02-16 DIAGNOSIS — E559 Vitamin D deficiency, unspecified: Secondary | ICD-10-CM | POA: Diagnosis present

## 2022-02-16 DIAGNOSIS — R1032 Left lower quadrant pain: Secondary | ICD-10-CM | POA: Diagnosis not present

## 2022-02-16 DIAGNOSIS — R1031 Right lower quadrant pain: Secondary | ICD-10-CM | POA: Diagnosis not present

## 2022-02-16 DIAGNOSIS — K219 Gastro-esophageal reflux disease without esophagitis: Secondary | ICD-10-CM | POA: Diagnosis not present

## 2022-02-16 DIAGNOSIS — Z8249 Family history of ischemic heart disease and other diseases of the circulatory system: Secondary | ICD-10-CM

## 2022-02-16 DIAGNOSIS — R109 Unspecified abdominal pain: Secondary | ICD-10-CM | POA: Diagnosis present

## 2022-02-16 DIAGNOSIS — Z6841 Body Mass Index (BMI) 40.0 and over, adult: Secondary | ICD-10-CM | POA: Diagnosis not present

## 2022-02-16 DIAGNOSIS — K760 Fatty (change of) liver, not elsewhere classified: Secondary | ICD-10-CM | POA: Diagnosis not present

## 2022-02-16 DIAGNOSIS — F32A Depression, unspecified: Secondary | ICD-10-CM | POA: Diagnosis present

## 2022-02-16 DIAGNOSIS — F419 Anxiety disorder, unspecified: Secondary | ICD-10-CM | POA: Diagnosis present

## 2022-02-16 DIAGNOSIS — M7918 Myalgia, other site: Secondary | ICD-10-CM | POA: Diagnosis present

## 2022-02-16 DIAGNOSIS — R1084 Generalized abdominal pain: Secondary | ICD-10-CM

## 2022-02-16 DIAGNOSIS — Z79899 Other long term (current) drug therapy: Secondary | ICD-10-CM | POA: Insufficient documentation

## 2022-02-16 DIAGNOSIS — Z8261 Family history of arthritis: Secondary | ICD-10-CM | POA: Diagnosis not present

## 2022-02-16 DIAGNOSIS — I10 Essential (primary) hypertension: Secondary | ICD-10-CM | POA: Insufficient documentation

## 2022-02-16 DIAGNOSIS — Z888 Allergy status to other drugs, medicaments and biological substances status: Secondary | ICD-10-CM | POA: Diagnosis not present

## 2022-02-16 DIAGNOSIS — M19019 Primary osteoarthritis, unspecified shoulder: Secondary | ICD-10-CM | POA: Diagnosis present

## 2022-02-16 DIAGNOSIS — G4733 Obstructive sleep apnea (adult) (pediatric): Secondary | ICD-10-CM | POA: Diagnosis not present

## 2022-02-16 DIAGNOSIS — Z720 Tobacco use: Secondary | ICD-10-CM | POA: Diagnosis not present

## 2022-02-16 DIAGNOSIS — Z9989 Dependence on other enabling machines and devices: Secondary | ICD-10-CM | POA: Diagnosis not present

## 2022-02-16 DIAGNOSIS — Z833 Family history of diabetes mellitus: Secondary | ICD-10-CM

## 2022-02-16 DIAGNOSIS — G894 Chronic pain syndrome: Principal | ICD-10-CM | POA: Diagnosis present

## 2022-02-16 DIAGNOSIS — R1030 Lower abdominal pain, unspecified: Secondary | ICD-10-CM | POA: Diagnosis not present

## 2022-02-16 LAB — URINALYSIS, ROUTINE W REFLEX MICROSCOPIC
Bilirubin Urine: NEGATIVE
Glucose, UA: NEGATIVE mg/dL
Hgb urine dipstick: NEGATIVE
Ketones, ur: NEGATIVE mg/dL
Leukocytes,Ua: NEGATIVE
Nitrite: NEGATIVE
Protein, ur: NEGATIVE mg/dL
Specific Gravity, Urine: 1.016 (ref 1.005–1.030)
pH: 5 (ref 5.0–8.0)

## 2022-02-16 LAB — COMPREHENSIVE METABOLIC PANEL
ALT: 34 U/L (ref 0–44)
AST: 26 U/L (ref 15–41)
Albumin: 4.5 g/dL (ref 3.5–5.0)
Alkaline Phosphatase: 62 U/L (ref 38–126)
Anion gap: 12 (ref 5–15)
BUN: 18 mg/dL (ref 6–20)
CO2: 22 mmol/L (ref 22–32)
Calcium: 9.7 mg/dL (ref 8.9–10.3)
Chloride: 104 mmol/L (ref 98–111)
Creatinine, Ser: 1.04 mg/dL (ref 0.61–1.24)
GFR, Estimated: >60 mL/min (ref >60)
Glucose, Bld: 95 mg/dL (ref 70–99)
Potassium: 3.8 mmol/L (ref 3.5–5.1)
Sodium: 138 mmol/L (ref 135–145)
Total Bilirubin: 0.6 mg/dL (ref 0.3–1.2)
Total Protein: 7.7 g/dL (ref 6.5–8.1)

## 2022-02-16 LAB — CBC WITH DIFFERENTIAL/PLATELET
Abs Immature Granulocytes: 0.12 10*3/uL — ABNORMAL HIGH (ref 0.00–0.07)
Basophils Absolute: 0 10*3/uL (ref 0.0–0.1)
Basophils Relative: 0 %
Eosinophils Absolute: 0.1 10*3/uL (ref 0.0–0.5)
Eosinophils Relative: 1 %
HCT: 43.5 % (ref 39.0–52.0)
Hemoglobin: 14 g/dL (ref 13.0–17.0)
Immature Granulocytes: 1 %
Lymphocytes Relative: 17 %
Lymphs Abs: 2.8 10*3/uL (ref 0.7–4.0)
MCH: 29.7 pg (ref 26.0–34.0)
MCHC: 32.2 g/dL (ref 30.0–36.0)
MCV: 92.2 fL (ref 80.0–100.0)
Monocytes Absolute: 1.1 10*3/uL — ABNORMAL HIGH (ref 0.1–1.0)
Monocytes Relative: 7 %
Neutro Abs: 12.1 10*3/uL — ABNORMAL HIGH (ref 1.7–7.7)
Neutrophils Relative %: 74 %
Platelets: 370 10*3/uL (ref 150–400)
RBC: 4.72 MIL/uL (ref 4.22–5.81)
RDW: 13.8 % (ref 11.5–15.5)
WBC: 16.2 10*3/uL — ABNORMAL HIGH (ref 4.0–10.5)
nRBC: 0 % (ref 0.0–0.2)

## 2022-02-16 LAB — LACTIC ACID, PLASMA
Lactic Acid, Venous: 2.3 mmol/L (ref 0.5–1.9)
Lactic Acid, Venous: 1.1 mmol/L (ref 0.5–1.9)

## 2022-02-16 LAB — LIPASE, BLOOD: Lipase: 29 U/L (ref 11–51)

## 2022-02-16 MED ORDER — SODIUM CHLORIDE 0.9 % IV BOLUS
1000.0000 mL | Freq: Once | INTRAVENOUS | Status: AC
  Administered 2022-02-16: 1000 mL via INTRAVENOUS

## 2022-02-16 MED ORDER — MORPHINE SULFATE (PF) 4 MG/ML IV SOLN
4.0000 mg | Freq: Once | INTRAVENOUS | Status: AC
  Administered 2022-02-16: 4 mg via INTRAVENOUS
  Filled 2022-02-16: qty 1

## 2022-02-16 MED ORDER — PANTOPRAZOLE SODIUM 40 MG IV SOLR: 40.0000 mg | Freq: Once | INTRAVENOUS | Status: DC

## 2022-02-16 MED ORDER — DEXTROSE IN LACTATED RINGERS 5 % IV SOLN: INTRAVENOUS | Status: DC

## 2022-02-16 MED ORDER — HYDROCHLOROTHIAZIDE 25 MG PO TABS: 25.0000 mg | ORAL_TABLET | Freq: Every day | ORAL | Status: DC

## 2022-02-16 MED ORDER — SODIUM CHLORIDE 0.9 % IV BOLUS
500.0000 mL | Freq: Once | INTRAVENOUS | Status: DC
Start: 2022-02-16 — End: 2022-02-16

## 2022-02-16 MED ORDER — SPIRONOLACTONE 25 MG PO TABS
25.0000 mg | ORAL_TABLET | Freq: Every day | ORAL | Status: DC
  Administered 2022-02-17 – 2022-02-18 (×2): 25 mg via ORAL
  Filled 2022-02-16 (×2): qty 1

## 2022-02-16 MED ORDER — LORATADINE 10 MG PO TABS: 10.0000 mg | ORAL_TABLET | Freq: Every day | ORAL | Status: DC | PRN

## 2022-02-16 MED ORDER — HYDROMORPHONE HCL 1 MG/ML IJ SOLN
1.0000 mg | Freq: Once | INTRAMUSCULAR | Status: AC
  Administered 2022-02-16: 1 mg via INTRAVENOUS
  Filled 2022-02-16: qty 1

## 2022-02-16 MED ORDER — GABAPENTIN 300 MG PO CAPS: 600.0000 mg | ORAL_CAPSULE | Freq: Three times a day (TID) | ORAL | Status: DC | PRN

## 2022-02-16 MED ORDER — ONDANSETRON HCL 4 MG/2ML IJ SOLN: 4.0000 mg | Freq: Four times a day (QID) | INTRAMUSCULAR | Status: DC | PRN

## 2022-02-16 MED ORDER — ONDANSETRON HCL 4 MG/2ML IJ SOLN
4.0000 mg | Freq: Once | INTRAMUSCULAR | Status: AC
  Administered 2022-02-16: 4 mg via INTRAVENOUS
  Filled 2022-02-16: qty 2

## 2022-02-16 MED ORDER — ENOXAPARIN SODIUM 80 MG/0.8ML IJ SOSY
70.0000 mg | PREFILLED_SYRINGE | INTRAMUSCULAR | Status: DC
Start: 2022-02-16 — End: 2022-02-18
  Administered 2022-02-16 – 2022-02-17 (×2): 70 mg via SUBCUTANEOUS
  Filled 2022-02-16 (×2): qty 0.8

## 2022-02-16 MED ORDER — ANASTROZOLE 1 MG PO TABS
1.0000 mg | ORAL_TABLET | ORAL | Status: DC
  Administered 2022-02-17: 1 mg via ORAL
  Filled 2022-02-16: qty 1

## 2022-02-16 MED ORDER — IOHEXOL 300 MG/ML  SOLN
100.0000 mL | Freq: Once | INTRAMUSCULAR | Status: AC | PRN
  Administered 2022-02-16: 100 mL via INTRAVENOUS

## 2022-02-16 MED ORDER — AMLODIPINE BESYLATE 10 MG PO TABS
10.0000 mg | ORAL_TABLET | Freq: Every day | ORAL | Status: DC
Start: 2022-02-17 — End: 2022-02-17

## 2022-02-16 MED ORDER — ACETAMINOPHEN 650 MG RE SUPP: 650.0000 mg | Freq: Four times a day (QID) | RECTAL | Status: DC | PRN

## 2022-02-16 MED ORDER — VALSARTAN-HYDROCHLOROTHIAZIDE 320-25 MG PO TABS: 1.0000 | ORAL_TABLET | Freq: Every morning | ORAL | Status: DC

## 2022-02-16 MED ORDER — BUPROPION HCL ER (XL) 150 MG PO TB24
150.0000 mg | ORAL_TABLET | Freq: Every day | ORAL | Status: DC
  Administered 2022-02-17 – 2022-02-18 (×2): 150 mg via ORAL
  Filled 2022-02-16 (×2): qty 1

## 2022-02-16 MED ORDER — ESCITALOPRAM OXALATE 20 MG PO TABS
20.0000 mg | ORAL_TABLET | Freq: Every day | ORAL | Status: DC
  Administered 2022-02-17 – 2022-02-18 (×2): 20 mg via ORAL
  Filled 2022-02-16 (×2): qty 1

## 2022-02-16 MED ORDER — PANTOPRAZOLE SODIUM 40 MG PO TBEC
40.0000 mg | DELAYED_RELEASE_TABLET | Freq: Two times a day (BID) | ORAL | Status: DC
  Administered 2022-02-16: 40 mg via ORAL
  Filled 2022-02-16: qty 1

## 2022-02-16 MED ORDER — ALLOPURINOL 300 MG PO TABS
300.0000 mg | ORAL_TABLET | Freq: Every day | ORAL | Status: DC
  Administered 2022-02-17 – 2022-02-18 (×2): 300 mg via ORAL
  Filled 2022-02-16 (×2): qty 1

## 2022-02-16 MED ORDER — LAMOTRIGINE 100 MG PO TABS
100.0000 mg | ORAL_TABLET | Freq: Two times a day (BID) | ORAL | Status: DC
  Administered 2022-02-16 – 2022-02-18 (×4): 100 mg via ORAL
  Filled 2022-02-16 (×4): qty 1

## 2022-02-16 MED ORDER — IRBESARTAN 150 MG PO TABS: 300.0000 mg | ORAL_TABLET | Freq: Every day | ORAL | Status: DC

## 2022-02-16 MED ORDER — ONDANSETRON HCL 4 MG PO TABS: 4.0000 mg | ORAL_TABLET | Freq: Four times a day (QID) | ORAL | Status: DC | PRN

## 2022-02-16 MED ORDER — ACETAMINOPHEN 325 MG PO TABS
650.0000 mg | ORAL_TABLET | Freq: Four times a day (QID) | ORAL | Status: DC | PRN
  Administered 2022-02-18: 650 mg via ORAL
  Filled 2022-02-16: qty 2

## 2022-02-16 MED ORDER — MORPHINE SULFATE (PF) 2 MG/ML IV SOLN
2.0000 mg | INTRAVENOUS | Status: DC | PRN
  Administered 2022-02-16 – 2022-02-17 (×2): 2 mg via INTRAVENOUS
  Filled 2022-02-16 (×2): qty 1

## 2022-02-16 MED ORDER — PANTOPRAZOLE SODIUM 40 MG IV SOLR
40.0000 mg | Freq: Once | INTRAVENOUS | Status: AC
  Administered 2022-02-16: 40 mg via INTRAVENOUS
  Filled 2022-02-16: qty 10

## 2022-02-16 NOTE — H&P (Signed)
History and Physical    Patient: Joshua Parrish RKY:[id number] DOB: [birth year] DOA: 02/16/2022 DOS: the patient was seen and examined on 02/16/2022 PCP: Marrian Salvage, [place]  Patient coming from: Home  Chief Complaint: No chief complaint on file.  HPI: Joshua Parrish is a 46 y.o. male with medical history significant of GERD, fatty liver, morbid obesity, essential hypertension, depression, anxiety, chronic pain syndrome who presents the ER with sudden onset of abdominal pain that occurred today while he was playing videogames at home.  Patient complain of 10 out of 10 pain.  It is mainly in the epigastric region.  Patient apparently had heavy lifting yesterday at So far his pain has improved slightly with pain medications.  Lactic acid was initially 2.3.  He has leukocytosis with white count of 16,000.  Fluid boluses given.  Pain persists.  GI was consulted by ER and recommends admission and they will follow in the morning.  Patient is therefore being admitted to the medical service with GI follow-up.  Review of Systems: As mentioned in the history of present illness. All other systems reviewed and are negative. Past Medical History:  Diagnosis Date   Chicken pox    Depression    Fatty liver    GERD (gastroesophageal reflux disease)    Migraines    Osteoarthritis of shoulder    left   Rotator cuff impingement syndrome of left shoulder 06/19/2015   Sinusitis    Past Surgical History:  Procedure Laterality Date   APPENDECTOMY     CERVICAL LAMINECTOMY     has plates and fusion   CHOLECYSTECTOMY N/A 12/24/2015   Procedure: LAPAROSCOPIC CHOLECYSTECTOMY ;  Surgeon: [name] Pickerel, MD;  Location: [facility];  Service: General;  Laterality: N/A;   CLAVICLE EXCISION Left    HYPOSPADIAS CORRECTION     as child   KNEE SURGERY Bilateral    multiple   SHOULDER ARTHROSCOPY WITH ROTATOR CUFF REPAIR Left 06/26/2015   Procedure: SHOULDER ARTHROSCOPY WITH ROTATOR CUFF REPAIR;  Surgeon: Elsie Saas, MD;   Location: [facility];  Service: Orthopedics;  Laterality: Left;   Social History:  reports that he has never smoked. His smokeless tobacco use includes chew. He reports current alcohol use. He reports that he does not use drugs.  Allergies  Allergen Reactions   Coconut Oil Diarrhea, Nausea And Vomiting and Other (See Comments)    Allergic to coconuts   Lisinopril Cough    Family History  Problem Relation Age of Onset   Arthritis Mother    Ovarian cancer Mother    Alcohol abuse Father    Breast cancer Maternal Uncle    Breast cancer Maternal Grandmother    Cancer Maternal Grandmother        spinal surgery   Cancer Other    Hypertension Other    Diabetes Other    Breast cancer Maternal Great-grandfather    Colon cancer Neg Hx    Sleep apnea Neg Hx     Prior to Admission medications   Medication Sig Start Date End Date Taking? Authorizing Provider  allopurinol (ZYLOPRIM) 300 MG tablet TAKE ONE TABLET BY MOUTH DAILY Patient taking differently: Take 300 mg by mouth daily. 10/07/21  Yes Marrian Salvage, FNP  amLODipine (NORVASC) 10 MG tablet TAKE ONE TABLET BY MOUTH DAILY Patient taking differently: Take 10 mg by mouth in the morning. 02/03/22  Yes Marrian Salvage, FNP  anastrozole (ARIMIDEX) 1 MG tablet Take 1 mg by mouth every Monday,  Wednesday, and Friday.   Yes [provider]  buPROPion (WELLBUTRIN XL) 150 MG 24 hr tablet TAKE ONE TABLET BY MOUTH DAILY Patient taking differently: Take 150 mg by mouth in the morning. 01/13/22  Yes Marrian Salvage, FNP  escitalopram (LEXAPRO) 20 MG tablet TAKE ONE TABLET BY MOUTH DAILY Patient taking differently: Take 20 mg by mouth in the morning. 07/15/21  Yes Marrian Salvage, FNP  gabapentin (NEURONTIN) 300 MG capsule TAKE ONE TO TWO CAPSULES BY MOUTH THREE TIMES A DAY AS NEEDED Patient taking differently: Take 600 mg by mouth 3 (three) times daily as needed (for pain). 01/31/22  Yes Gregor Hams,  MD  ibuprofen (ADVIL) 200 MG tablet Take 600 mg by mouth every 6 (six) hours as needed for headache or mild pain.   Yes [provider]  lamoTRIgine (LAMICTAL) 100 MG tablet Take 1 tablet (100 mg total) by mouth 2 (two) times daily. 01/09/22  Yes Marcial Pacas, MD  loratadine (CLARITIN) 10 MG tablet Take 10 mg by mouth daily as needed for allergies or rhinitis.   Yes [provider]  omeprazole (PRILOSEC) 40 MG capsule Take 1 capsule (40 mg total) by mouth daily. Patient taking differently: Take 40 mg by mouth daily before breakfast. 09/20/21  Yes Marrian Salvage, FNP  spironolactone (ALDACTONE) 25 MG tablet TAKE ONE TABLET BY MOUTH DAILY Patient taking differently: Take 25 mg by mouth in the morning. 01/13/22  Yes Marrian Salvage, FNP  tadalafil (CIALIS) 20 MG tablet Take 20 mg by mouth in the morning.   Yes [provider]  valsartan-hydrochlorothiazide (DIOVAN-HCT) 320-25 MG tablet TAKE ONE TABLET BY MOUTH DAILY Patient taking differently: Take 1 tablet by mouth in the morning. 10/07/21  Yes Marrian Salvage, FNP  XYOSTED 75 MG/0.5ML SOAJ Inject 75 mg into the skin every Wednesday.   Yes [provider]  cyclobenzaprine (FLEXERIL) 10 MG tablet Take 1 tablet (10 mg total) by mouth 3 (three) times daily as needed for muscle spasms. Patient not taking: Reported on 02/16/2022 11/22/21   Chevis Pretty, FNP  HYDROcodone-acetaminophen (NORCO/VICODIN) 5-325 MG tablet Take 1 tablet by mouth every 6 (six) hours as needed. Patient not taking: Reported on 02/16/2022 12/25/21   Gregor Hams, MD  ipratropium (ATROVENT) 0.03 % nasal spray Place 2 sprays into both nostrils every 12 (twelve) hours. Patient not taking: Reported on 02/16/2022 08/28/21   Mar Daring, PA-C  lamoTRIgine (LAMICTAL) 25 MG tablet 1 twice a day x1wk 2 twice a day x 2nd wk 3 twice a day x3rd wk Patient not taking: Reported on 02/16/2022 01/09/22   Marcial Pacas, MD  methocarbamol  (ROBAXIN) 500 MG tablet Take 1 tablet (500 mg total) by mouth at bedtime as needed. Patient not taking: Reported on 02/16/2022 11/12/20   Marrian Salvage, FNP  naproxen (NAPROSYN) 500 MG tablet Take 1 tablet (500 mg total) by mouth 2 (two) times daily with a meal. Patient not taking: Reported on 02/16/2022 11/22/21   Chevis Pretty, FNP  nystatin-triamcinolone (MYCOLOG II) cream Apply 1 application topically 2 (two) times daily. Patient not taking: Reported on 02/16/2022 09/20/21   Marrian Salvage, FNP  Semaglutide-Weight Management (WEGOVY) 0.25 MG/0.5ML SOAJ Inject 0.25 mg into the skin once a week. Patient not taking: Reported on 02/16/2022 02/07/22   Marrian Salvage, FNP  triamcinolone cream (KENALOG) 0.1 % Apply 1 application topically 2 (two) times daily. Patient not taking: Reported on 02/16/2022 12/17/20   Marrian Salvage,  FNP    Physical Exam: Vitals:   02/16/22 1845 02/16/22 1900 02/16/22 1930 02/16/22 2005  BP: (!) 157/93 134/87 122/69 (!) 115/57  Pulse: 86 80 77 79  Resp: (!) '21 18 16 20  '$ Temp:    98.1 F (36.7 C)  TempSrc:    Oral  SpO2: 92% 93% 93% 94%  Weight:      Height:       Generally: Stable, no acute distress HEENT: PERRL, EOMI, no pallor or jaundice Neck supple no JVD no lymphadenopathy Respiratory: Good air entry bilaterally no wheeze rales or crackles Cardiovascular system: Regular rate and rhythm Abdomen: Soft, diffuse tenderness, positive bowel sounds Extremities: No edema sinus or clubbing Skin exam: No rashes or ulcers Neuro exam: Nonfocal Psych: Awake alert oriented, anxious Musculoskeletal: No joint swelling or tenderness  Data Reviewed:  Chemistry is entirely normal lactic acid was 2.3, white count 16.2, glucose 95, urinalysis negative, CT abdomen pelvis showed no acute findings  Assessment and Plan:  #1 unspecified abdominal pain: Patient has abdominal pain with normal lipase, normal CT, some leukocytosis and low-grade  temp.  Pain could be secondary to gastritis as patient has history of GERD.  It could also be other causes.  GI already consulted.  We will admit the patient.  Initiate Protonix.  Also pain management.  He has chronic pain and has been on hydrocodone.  No evidence of obstipation or constipation.  Could be musculoskeletal.  Will defer to GI.  #2 essential hypertension: We will confirm on resume home regimen.  #3 GERD: Protonix will be ordered twice a day.  #4 obstructive sleep apnea: CPAP at night  #5 osteoarthritis: Patient has had multiple shoulder pain and arthritis.  And has taken NSAIDs it looks like.  H&H is stable with no evidence of GI bleed.  His abdominal pain could however be partly related.  We will avoid NSAIDs at the moment.     Advance Care Planning:   Code Status: Full Code full code  Consults: Gastroenterology Dr. Arvid Right  Family Communication: No family at bedside  Severity of Illness: The appropriate patient status for this patient is OBSERVATION. Observation status is judged to be reasonable and necessary in order to provide the required intensity of service to ensure the patient's safety. The patient's presenting symptoms, physical exam findings, and initial radiographic and laboratory data in the context of their medical condition is felt to place them at decreased risk for further clinical deterioration. Furthermore, it is anticipated that the patient will be medically stable for discharge from the hospital within 2 midnights of admission.   AuthorBarbette Merino, MD 02/16/2022 8:30 PM  For on call review [email or web address].

## 2022-02-16 NOTE — ED Triage Notes (Signed)
Recently got diagnosed with a right inguinal hernia, has a surgical consult for this. Around 8-9am today he started having extreme abdominal pain, diarrhea, nausea. Abdomen tender to touch. Reports back pain as well, had an epidural earlier this week. Picked up a 65 lb concrete umbrella stand last night, could be the problem.

## 2022-02-16 NOTE — ED Provider Notes (Signed)
Signout received on this 46 year old male who presents with sudden onset of acute abdominal pain that occurred this morning while patient was at rest plain videogame.  He has history of lifting heavy brick umbrella stand yesterday.  However this is not unusual for him as he lifts heavy routinely for his work.  History of umbilical hernia.  Pain is predominantly in the epigastric region and the right lower quadrant.  Has rigidity, distention, and guarding present on exam.  He is laying flat for comfort.  He was given pain medication about 1 hour prior to my arrival however he states in the interim he had an abdominal exam and his pain is flared up again.  We will give him an additional dose of pain medication.  His work-up so far reveals leukocytosis of 16,000, without anemia.  CMP is unremarkable.  Lactic acid of 2.3.  Bolus ongoing.  UA and lipase ordered.  CT abdomen pelvis without acute process. Physical Exam  BP 131/75   Pulse 99   Temp 98.4 F (36.9 C) (Oral)   Resp 19   Ht '5\' 9"'$  (1.753 m)   Wt (!) 145.2 kg   SpO2 93%   BMI 47.26 kg/m     Procedures  Procedures  ED Course / MDM   Clinical Course as of 02/16/22 1602  Sun Feb 16, 2022  1512 Lactic Acid, Venous(!!): 2.3 [JS]  1512 WBC(!): 16.2 [JS]    Clinical Course User Index [JS] Janeece Fitting, PA-C   Medical Decision Making Amount and/or Complexity of Data Reviewed Labs: ordered. Decision-making details documented in ED Course. Radiology: ordered.  Risk Prescription drug management.   Lipase within normal.  Lactic acidosis resolved.  Discussed with gastroenterology for concern of gastric ulcer.  They recommend admitting to medicine service and they will evaluate patient tomorrow morning.  Discussed with hospitalist will evaluate patient for admission.       Evlyn Courier, PA-C 02/16/22 Lezlie Octave, MD 02/17/22 [phone]

## 2022-02-16 NOTE — Progress Notes (Signed)
Patient complaining of abdominal pain, has no orders, sent MD Garba a secure chat, no response at the moment.

## 2022-02-16 NOTE — ED Provider Notes (Signed)
[facility] DEPT Provider Note   CSN: [id number] Arrival date & time: 02/16/22  1248     History  No chief complaint on file.   Joshua Parrish is a 46 y.o. male.  46 year old male with extensive PMH of right helical hernia, GERD, migraines presents to the ED with a chief complaint of severe sudden abdominal pain.  Patient reports he was playing Xbox today, when he began to experience some sharp significant pain to the right inguinal area.  Pain is exacerbated with any type of movement.  Patient has had some decrease in p.o. intake, some nausea and vomiting which occurred today.  Patient did carry a break umbrella base yesterday from the tailgate back to the truck, according to his wife at the bedside this was very heavy there was no immediate severe pain at the time.  Patient's last oral intake was yesterday, endorses decrease in oral intake today.  Has not taken any medication for improvement in symptoms. No fever, no diarrhea, no other complaints.  Surgical history remarkable for appendectomy, cholecystectomy.  The history is provided by the patient and medical records.       Home Medications Prior to Admission medications   Medication Sig Start Date End Date Taking? Authorizing Provider  allopurinol (ZYLOPRIM) 300 MG tablet TAKE ONE TABLET BY MOUTH DAILY Patient taking differently: Take 300 mg by mouth daily. 10/07/21  Yes Marrian Salvage, FNP  amLODipine (NORVASC) 10 MG tablet TAKE ONE TABLET BY MOUTH DAILY Patient taking differently: Take 10 mg by mouth in the morning. 02/03/22  Yes Marrian Salvage, FNP  anastrozole (ARIMIDEX) 1 MG tablet Take 1 mg by mouth every Monday, Wednesday, and Friday.   Yes [provider]  buPROPion (WELLBUTRIN XL) 150 MG 24 hr tablet TAKE ONE TABLET BY MOUTH DAILY Patient taking differently: Take 150 mg by mouth in the morning. 01/13/22  Yes Marrian Salvage, FNP  escitalopram (LEXAPRO) 20 MG tablet  TAKE ONE TABLET BY MOUTH DAILY Patient taking differently: Take 20 mg by mouth in the morning. 07/15/21  Yes Marrian Salvage, FNP  gabapentin (NEURONTIN) 300 MG capsule TAKE ONE TO TWO CAPSULES BY MOUTH THREE TIMES A DAY AS NEEDED Patient taking differently: Take 600 mg by mouth 3 (three) times daily as needed (for pain). 01/31/22  Yes Gregor Hams, MD  ibuprofen (ADVIL) 200 MG tablet Take 600 mg by mouth every 6 (six) hours as needed for headache or mild pain.   Yes [provider]  lamoTRIgine (LAMICTAL) 100 MG tablet Take 1 tablet (100 mg total) by mouth 2 (two) times daily. 01/09/22  Yes Marcial Pacas, MD  loratadine (CLARITIN) 10 MG tablet Take 10 mg by mouth daily as needed for allergies or rhinitis.   Yes [provider]  omeprazole (PRILOSEC) 40 MG capsule Take 1 capsule (40 mg total) by mouth daily. Patient taking differently: Take 40 mg by mouth daily before breakfast. 09/20/21  Yes Marrian Salvage, FNP  spironolactone (ALDACTONE) 25 MG tablet TAKE ONE TABLET BY MOUTH DAILY Patient taking differently: Take 25 mg by mouth in the morning. 01/13/22  Yes Marrian Salvage, FNP  tadalafil (CIALIS) 20 MG tablet Take 20 mg by mouth in the morning.   Yes [provider]  valsartan-hydrochlorothiazide (DIOVAN-HCT) 320-25 MG tablet TAKE ONE TABLET BY MOUTH DAILY Patient taking differently: Take 1 tablet by mouth in the morning. 10/07/21  Yes Marrian Salvage, FNP  XYOSTED 75 MG/0.5ML SOAJ Inject 75  mg into the skin every Wednesday.   Yes [provider]  cyclobenzaprine (FLEXERIL) 10 MG tablet Take 1 tablet (10 mg total) by mouth 3 (three) times daily as needed for muscle spasms. Patient not taking: Reported on 02/16/2022 11/22/21   Chevis Pretty, FNP  HYDROcodone-acetaminophen (NORCO/VICODIN) 5-325 MG tablet Take 1 tablet by mouth every 6 (six) hours as needed. Patient not taking: Reported on 02/16/2022 12/25/21   Gregor Hams, MD   ipratropium (ATROVENT) 0.03 % nasal spray Place 2 sprays into both nostrils every 12 (twelve) hours. Patient not taking: Reported on 02/16/2022 08/28/21   Mar Daring, PA-C  lamoTRIgine (LAMICTAL) 25 MG tablet 1 twice a day x1wk 2 twice a day x 2nd wk 3 twice a day x3rd wk Patient not taking: Reported on 02/16/2022 01/09/22   Marcial Pacas, MD  methocarbamol (ROBAXIN) 500 MG tablet Take 1 tablet (500 mg total) by mouth at bedtime as needed. Patient not taking: Reported on 02/16/2022 11/12/20   Marrian Salvage, FNP  naproxen (NAPROSYN) 500 MG tablet Take 1 tablet (500 mg total) by mouth 2 (two) times daily with a meal. Patient not taking: Reported on 02/16/2022 11/22/21   Chevis Pretty, FNP  nystatin-triamcinolone (MYCOLOG II) cream Apply 1 application topically 2 (two) times daily. Patient not taking: Reported on 02/16/2022 09/20/21   Marrian Salvage, FNP  Semaglutide-Weight Management (WEGOVY) 0.25 MG/0.5ML SOAJ Inject 0.25 mg into the skin once a week. Patient not taking: Reported on 02/16/2022 02/07/22   Marrian Salvage, FNP  triamcinolone cream (KENALOG) 0.1 % Apply 1 application topically 2 (two) times daily. Patient not taking: Reported on 02/16/2022 12/17/20   Marrian Salvage, FNP      Allergies    Coconut oil and Lisinopril    Review of Systems   Review of Systems  Constitutional:  Negative for chills and fever.  HENT:  Negative for sore throat.   Respiratory:  Negative for shortness of breath.   Cardiovascular:  Negative for chest pain.  Gastrointestinal:  Positive for abdominal pain and nausea. Negative for vomiting.  Genitourinary:  Negative for flank pain.  Musculoskeletal:  Negative for back pain.  Neurological:  Negative for light-headedness and headaches.  All other systems reviewed and are negative.   Physical Exam Updated Vital Signs BP (!) 143/89   Pulse 100   Temp 98.4 F (36.9 C) (Oral)   Resp (!) 21   Ht '5\' 9"'$  (1.753 m)   Wt  (!) 145.2 kg   SpO2 95%   BMI 47.26 kg/m  Physical Exam Vitals and nursing note reviewed.  Constitutional:      Appearance: Normal appearance.  HENT:     Head: Normocephalic and atraumatic.     Mouth/Throat:     Mouth: Mucous membranes are moist.  Eyes:     Pupils: Pupils are equal, round, and reactive to light.  Cardiovascular:     Rate and Rhythm: Tachycardia present.  Pulmonary:     Effort: Pulmonary effort is normal.     Breath sounds: No wheezing.  Abdominal:     General: Abdomen is flat. Bowel sounds are decreased. There is distension.     Palpations: Abdomen is rigid.     Tenderness: There is abdominal tenderness. There is guarding. There is no right CVA tenderness or left CVA tenderness.     Comments: Abdomen is severely distended, rigidity noted, bowel sounds are decreased.  There is guarding on exam  Musculoskeletal:     Cervical  back: Normal range of motion and neck supple.  Skin:    General: Skin is warm and dry.  Neurological:     Mental Status: He is alert and oriented to person, place, and time.     ED Results / Procedures / Treatments   Labs (all labs ordered are listed, but only abnormal results are displayed) Labs Reviewed  CBC WITH DIFFERENTIAL/PLATELET - Abnormal; Notable for the following components:      Result Value   WBC 16.2 (*)    Neutro Abs 12.1 (*)    Monocytes Absolute 1.1 (*)    Abs Immature Granulocytes 0.12 (*)    All other components within normal limits  LACTIC ACID, PLASMA - Abnormal; Notable for the following components:   Lactic Acid, Venous 2.3 (*)    All other components within normal limits  COMPREHENSIVE METABOLIC PANEL  LACTIC ACID, PLASMA  URINALYSIS, ROUTINE W REFLEX MICROSCOPIC  LIPASE, BLOOD    EKG None  Radiology CT ABDOMEN PELVIS W CONTRAST  Result Date: 02/16/2022 CLINICAL DATA:  Severe abdominal pain and tenderness beginning today. Nausea and diarrhea. EXAM: CT ABDOMEN AND PELVIS WITH CONTRAST TECHNIQUE:  Multidetector CT imaging of the abdomen and pelvis was performed using the standard protocol following bolus administration of intravenous contrast. RADIATION DOSE REDUCTION: This exam was performed according to the departmental dose-optimization program which includes automated exposure control, adjustment of the mA and/or kV according to patient size and/or use of iterative reconstruction technique. CONTRAST:  172m OMNIPAQUE IOHEXOL 300 MG/ML  SOLN COMPARISON:  12/30/2021 FINDINGS: Lower Chest: No acute findings. Hepatobiliary: No hepatic masses identified. Moderate diffuse hepatic steatosis is again seen, with focal areas of fatty sparing in the left hepatic lobe adjacent to the falciform ligament. Prior cholecystectomy. No evidence of biliary obstruction. Pancreas:  No mass or inflammatory changes. Spleen: Within normal limits in size and appearance. Adrenals/Urinary Tract: No masses identified. No evidence of ureteral calculi or hydronephrosis. Stomach/Bowel: No evidence of obstruction, inflammatory process or abnormal fluid collections. Vascular/Lymphatic: No pathologically enlarged lymph nodes. No acute vascular findings. Aortic atherosclerotic calcification incidentally noted. Reproductive:  No mass or other significant abnormality. Other:  None. Musculoskeletal:  No suspicious bone lesions identified. IMPRESSION: No acute findings within the abdomen or pelvis. Stable moderate hepatic steatosis. Aortic Atherosclerosis (ICD10-I70.0). Electronically Signed   By: JMarlaine HindM.D.   On: 02/16/2022 15:18    Procedures Procedures    Medications Ordered in ED Medications  pantoprazole (PROTONIX) injection 40 mg (has no administration in time range)  morphine (PF) 4 MG/ML injection 4 mg (4 mg Intravenous Given 02/16/22 1348)  ondansetron (ZOFRAN) injection 4 mg (4 mg Intravenous Given 02/16/22 1348)  iohexol (OMNIPAQUE) 300 MG/ML solution 100 mL (100 mLs Intravenous Contrast Given 02/16/22 1437)   HYDROmorphone (DILAUDID) injection 1 mg (1 mg Intravenous Given 02/16/22 1516)  sodium chloride 0.9 % bolus 1,000 mL (1,000 mLs Intravenous New Bag/Given 02/16/22 1519)    ED Course/ Medical Decision Making/ A&P Clinical Course as of 02/16/22 1539  Sun Feb 16, 2022  1512 Lactic Acid, Venous(!!): 2.3 [JS]  1512 WBC(!): 16.2 [JS]    Clinical Course User Index [JS] SJaneece Fitting PA-C                           Medical Decision Making Amount and/or Complexity of Data Reviewed Labs: ordered. Radiology: ordered.  Risk Prescription drug management.   This patient presents to the ED for concern of abdominal  pain, this involves a number of treatment options, and is a complaint that carries with it a high risk of complications and morbidity.  The differential diagnosis includes incarcerated hernia, strangulated hernia, versus SBO.    Co morbidities: Discussed in HPI   Brief History:  Patient here with sudden severe pain while playing Xbox today.  Did heavy lifting yesterday endorsing anorexia along with nausea and decrease in p.o. intake.  He diagnosed with right inguinal hernia.  EMR reviewed including pt PMHx, past surgical history and past visits to ER.   See HPI for more details   Lab Tests:  I ordered and independently interpreted labs.  The pertinent results include:    Labs notable for cytosis of 16.2, hemoglobin is stable.  CMP without any electrolyte derangement.  Lactic acid is elevated at 2.3.   Imaging Studies:  CT Abdomen with no acute findings.     Cardiac Monitoring:  The patient was maintained on a cardiac monitor.  I personally viewed and interpreted the cardiac monitored which showed an underlying rhythm of: NSR 120 EKG non-ischemic   Medicines ordered:  I ordered medication including morphine, zofran  for symptomatic treatment Reevaluation of the patient after these medicines showed that the patient stayed the same I have reviewed the patients  home medicines and have made adjustments as needed  Requires additional pain medication such as Dilaudid.  Critical Interventions:  Dilaudid given for pain control with some improvement in symptoms.    Reevaluation:  After the interventions noted above I re-evaluated patient and found that they have :stayed the same   Social Determinants of Health:  The patient's social determinants of health were a factor in the care of this patient    Problem List / ED Course:  Patient here with severe abdominal pain which began while playing Xbox today.  Did do some heavy lifting picking up 65 pound umbrella yesterday while at home.  Reports also nausea along with decrease in oral intake.  Extensive history to the abdomen including cholecystectomy and appendectomy.  Labs are abnormal today with a white count, a elevated lactate.  Ordered CT abdomen which showed no acute findings.  At the time of disposition patient's work-up was not completed, order further labs.   Dispostion:  After consideration of the diagnostic results and the patients response to treatment, I feel that the patent would benefit from continued evaluation by EDP, who will determine ultimate disposition.     Portions of this note were generated with [employer]. Dictation errors may occur despite best attempts at proofreading.   Final Clinical Impression(s) / ED Diagnoses Final diagnoses:  Abdominal pain, unspecified abdominal location    Rx / DC Orders ED Discharge Orders     None         Janeece Fitting, PA-C 02/16/22 1539    Daleen Bo, MD 02/17/22 1409

## 2022-02-17 ENCOUNTER — Inpatient Hospital Stay: Admission: RE | Admit: 2022-02-17 | Payer: 59 | Source: Ambulatory Visit

## 2022-02-17 ENCOUNTER — Telehealth: Payer: Self-pay | Admitting: *Deleted

## 2022-02-17 DIAGNOSIS — K760 Fatty (change of) liver, not elsewhere classified: Secondary | ICD-10-CM | POA: Diagnosis not present

## 2022-02-17 DIAGNOSIS — R1031 Right lower quadrant pain: Secondary | ICD-10-CM | POA: Diagnosis not present

## 2022-02-17 DIAGNOSIS — K219 Gastro-esophageal reflux disease without esophagitis: Secondary | ICD-10-CM | POA: Diagnosis not present

## 2022-02-17 DIAGNOSIS — R1032 Left lower quadrant pain: Secondary | ICD-10-CM

## 2022-02-17 DIAGNOSIS — R1030 Lower abdominal pain, unspecified: Secondary | ICD-10-CM

## 2022-02-17 DIAGNOSIS — R1084 Generalized abdominal pain: Secondary | ICD-10-CM | POA: Diagnosis not present

## 2022-02-17 LAB — COMPREHENSIVE METABOLIC PANEL
ALT: 29 U/L (ref 0–44)
AST: 17 U/L (ref 15–41)
Albumin: 3.9 g/dL (ref 3.5–5.0)
Alkaline Phosphatase: 56 U/L (ref 38–126)
Anion gap: 8 (ref 5–15)
BUN: 15 mg/dL (ref 6–20)
CO2: 28 mmol/L (ref 22–32)
Calcium: 8.9 mg/dL (ref 8.9–10.3)
Chloride: 103 mmol/L (ref 98–111)
Creatinine, Ser: 0.96 mg/dL (ref 0.61–1.24)
GFR, Estimated: >60 mL/min (ref >60)
Glucose, Bld: 101 mg/dL — ABNORMAL HIGH (ref 70–99)
Potassium: 3.8 mmol/L (ref 3.5–5.1)
Sodium: 139 mmol/L (ref 135–145)
Total Bilirubin: 0.7 mg/dL (ref 0.3–1.2)
Total Protein: 6.3 g/dL — ABNORMAL LOW (ref 6.5–8.1)

## 2022-02-17 LAB — CBC
HCT: 38.6 % — ABNORMAL LOW (ref 39.0–52.0)
Hemoglobin: 12.3 g/dL — ABNORMAL LOW (ref 13.0–17.0)
MCH: 29.7 pg (ref 26.0–34.0)
MCHC: 31.9 g/dL (ref 30.0–36.0)
MCV: 93.2 fL (ref 80.0–100.0)
Platelets: 265 10*3/uL (ref 150–400)
RBC: 4.14 MIL/uL — ABNORMAL LOW (ref 4.22–5.81)
RDW: 13.8 % (ref 11.5–15.5)
WBC: 7.7 10*3/uL (ref 4.0–10.5)
nRBC: 0 % (ref 0.0–0.2)

## 2022-02-17 MED ORDER — DICYCLOMINE HCL 10 MG PO CAPS
10.0000 mg | ORAL_CAPSULE | Freq: Three times a day (TID) | ORAL | Status: DC
Start: 2022-02-17 — End: 2022-02-18
  Administered 2022-02-17 – 2022-02-18 (×5): 10 mg via ORAL
  Filled 2022-02-17 (×5): qty 1

## 2022-02-17 MED ORDER — PANTOPRAZOLE SODIUM 40 MG PO TBEC
40.0000 mg | DELAYED_RELEASE_TABLET | Freq: Two times a day (BID) | ORAL | Status: DC
  Administered 2022-02-17 – 2022-02-18 (×3): 40 mg via ORAL
  Filled 2022-02-17 (×3): qty 1

## 2022-02-17 MED ORDER — LIDOCAINE 5 % EX PTCH
1.0000 | MEDICATED_PATCH | CUTANEOUS | Status: DC
  Administered 2022-02-17 – 2022-02-18 (×2): 1 via TRANSDERMAL
  Filled 2022-02-17 (×2): qty 1

## 2022-02-17 MED ORDER — AMLODIPINE BESYLATE 5 MG PO TABS
5.0000 mg | ORAL_TABLET | Freq: Every day | ORAL | Status: DC
Start: 2022-02-18 — End: 2022-02-17
  Filled 2022-02-17: qty 1

## 2022-02-17 MED ORDER — GABAPENTIN 300 MG PO CAPS
600.0000 mg | ORAL_CAPSULE | Freq: Three times a day (TID) | ORAL | Status: DC
  Administered 2022-02-17 – 2022-02-18 (×4): 600 mg via ORAL
  Filled 2022-02-17 (×4): qty 2

## 2022-02-17 MED ORDER — METHOCARBAMOL 500 MG PO TABS
500.0000 mg | ORAL_TABLET | Freq: Three times a day (TID) | ORAL | Status: DC
  Administered 2022-02-17 – 2022-02-18 (×4): 500 mg via ORAL
  Filled 2022-02-17 (×4): qty 1

## 2022-02-17 MED ORDER — HYDROCODONE-ACETAMINOPHEN 5-325 MG PO TABS
1.0000 | ORAL_TABLET | Freq: Four times a day (QID) | ORAL | Status: DC | PRN
  Administered 2022-02-17 – 2022-02-18 (×3): 1 via ORAL
  Filled 2022-02-17 (×3): qty 1

## 2022-02-17 NOTE — Consult Note (Addendum)
Consultation  Referring Provider:   Dr. Posey Pronto Primary Care Physician:  Marrian Salvage, [place] Primary Gastroenterologist:  Dr. Hilarie Fredrickson       Reason for Consultation:     Abdominal pain   Impression    Abdominal pain 10/25/2021 endoscopy and colonoscopy, endoscopy showed gastritis, status post dilatation for benign esophageal stenosis 02/16/2022 CT abdomen pelvis with contrast showed no pathology to explain patient's abdominal pain. With some associated diarrhea and nausea, possible gastroenteritis. Positive Carnett's sign after heavy lifting in a patient with chronic pain, possibly musculoskeletal.  GERD (gastroesophageal reflux disease) 10/25/2021 endoscopy showed gastritis, status post dilatation for benign esophageal stenosis Continue pantoprazole Patient having some mild dysphagia since May intermittent likely more GERD related, can consider barium swallow outpatient if continus Avoid NSAIDs.  Fatty liver  Chronic pain syndrome Has had imaging of cervical thoracic and lumbar spine.  Essential hypertension  OSA on CPAP    Recommendations   -With recent normal endoscopy, colonoscopy, CT abdomen / pelvis and labs resolved patient can likely be discharged home once pain is controlled. -Pain control per primary team -Protonix 40 mg PO daily -Continue Bentyl and zofran as needed. -Consider adding on carafate -Consider trying lidocaine patch with patient's history and positive Carnett's sign -Continue soft/regular diet -Encourage early ambulation  Thank you for your kind consultation, we will continue to follow.   Attending Physician Note   I have taken a history, reviewed the chart and examined the patient. I performed a substantive portion of this encounter, including complete performance of at least one of the key components, in conjunction with the APP. I agree with the APP's note, impression and recommendations with my edits. My additional impressions  and recommendations are as follows.   *Acute bilateral lower abdominal / groin pain appears to be musculoskeletal or radicular pain. CT AP negative for an acute GI process and positive Carnett's sign. No additional GI evaluation is planned at this time. Defer to primary service for further evaluation and mgmt.   *Acute diarrhea has resolved, suspect a self limited gastroenteritis. Not clear if this is related to his acute pain other than by timing. If diarrhea recurs send stool studies.   *GERD with history of esophageal strictures. Continue pantoprazole 40 mg po bid.   *Hepatic steatosis. Long term carb modified, fat modified, weight loss diet supervised by his PCP.   Joshua Edward, MD [facility] See AM[facility], [facility], for our on call provider             HPI:   Joshua Parrish is a 46 y.o. male with past medical history significant for fatty liver, GERD, depression, migraines, chronic pain, S/P appendectomy and cholecystectomy presents to the ER with abdominal pain.  Patient known to Dr. Hilarie Fredrickson. 10/08/2021 office visit by Ellouise Newer, PA for dysphagia.  Having symptoms since 2016 after cervical fusion, chronic reflux. 10/25/2021 endoscopy and colonoscopy Colonoscopy 4 mm polyp cecum, 2 polyps 4 to 6 mm in size ascending colon, 3 polyps 5 to 9 mm in size transverse colon.  Adenomatous polyps repeat 3 years. Endoscopy showed benign-appearing esophageal stenosis status post dilation, gastric polyps biopsied, gastritis, normal duodenum, no H. pylori, no EOE, benign gastritis 10/16/2015 colonoscopy with 3 polyps.  Pathology showed tubular adenomas.  Repeat recommended in 3 years.  Patient states he was sitting at home playing video games when had sudden 10 out of 10 epigastric and right lower abdominal pain with nausea no vomiting.  Had cold  sweats.  Had never felt pain like this prior.   Patient came to the ER because he was concerned it was a hernia. Patient states it was better with standing or  lying down worse with sitting. Patient also had several bouts of diarrhea with this. No fevers no chills, no sick contacts. Patient is normally been having bowel movement 1-2 times daily, no rectal pain has had scant bright red blood on toilet paper. Patient states day before he was lifting 65 pound concrete slab. Patient does dip denies smoking, rare alcohol, no drug use.  No anti-inflammatories. Patient denies chest pain does have some shortness of breath with the pain.  Patient had endoscopy February of this year postdilatation, states in May has began to have intermittent dysphagia with meats.  Some worsening GERD.  Primary care switch to pantoprazole 40 mg once daily.  Some improvement. Patient's been placed on several new medications and placed on Lamictal for pain, testosterone shots, Cialis daily for painful urination.  Patient presented to the ER with abdominal pain, nausea and diarrhea. 02/16/2022 CT abdomen pelvis with contrast showed moderate diffuse hepatic steatosis no masses no biliary obstruction pancreas normal, spleen normal.  No evidence of obstruction, inflammatory process or abnormal fluid collections-no pathology to explain patient's abdominal pain. 02/16/2022 Labs in the ER show white blood cell count 16.2, this was corrected today to 7.7 with fluids.  Hemoglobin 14 in the ER currently at 12.3, MCV 93.2.  In the ER patient had normal lipase, normal urinalysis, lactic acid initially elevated 2.3 improved with fluids Currently AST 17, ALT 29, alk phos 56.  Total protein 6.3, BUN 15, creatinine 0.96 improved from yesterday BUN 18, creatinine 1.04 Patient had recent work-up with neurology on 01/10/2012 for Clonus recurrent neck, shoulder pain.  B12, copper level normal, low vitamin D, weak ANA, and sed rate 30  Started on Lamictal 100 mg twice daily, titrating up.  Abnormal ED labs: Abnormal Labs Reviewed  CBC WITH DIFFERENTIAL/PLATELET - Abnormal; Notable for the following  components:      Result Value   WBC 16.2 (*)    Neutro Abs 12.1 (*)    Monocytes Absolute 1.1 (*)    Abs Immature Granulocytes 0.12 (*)    All other components within normal limits  LACTIC ACID, PLASMA - Abnormal; Notable for the following components:   Lactic Acid, Venous 2.3 (*)    All other components within normal limits  COMPREHENSIVE METABOLIC PANEL - Abnormal; Notable for the following components:   Glucose, Bld 101 (*)    Total Protein 6.3 (*)    All other components within normal limits  CBC - Abnormal; Notable for the following components:   RBC 4.14 (*)    Hemoglobin 12.3 (*)    HCT 38.6 (*)    All other components within normal limits     Past Medical History:  Diagnosis Date   Chicken pox    Depression    Fatty liver    GERD (gastroesophageal reflux disease)    Migraines    Osteoarthritis of shoulder    left   Rotator cuff impingement syndrome of left shoulder 06/19/2015   Sinusitis     Surgical History:  He  has a past surgical history that includes Clavicle excision (Left); Appendectomy; Knee surgery (Bilateral); Cervical laminectomy; Hypospadius correction; Shoulder arthroscopy with rotator cuff repair (Left, 06/26/2015); and Cholecystectomy (N/A, 12/24/2015). Family History:  His family history includes Alcohol abuse in his father; Arthritis in his mother; Breast cancer in  his maternal grandmother, maternal great-grandfather, and maternal uncle; Cancer in his maternal grandmother and another family member; Diabetes in an other family member; Hypertension in an other family member; Ovarian cancer in his mother. Social History:   reports that he has never smoked. His smokeless tobacco use includes chew. He reports current alcohol use. He reports that he does not use drugs.  Prior to Admission medications   Medication Sig Start Date End Date Taking? Authorizing Provider  allopurinol (ZYLOPRIM) 300 MG tablet TAKE ONE TABLET BY MOUTH DAILY Patient taking  differently: Take 300 mg by mouth daily. 10/07/21  Yes Marrian Salvage, FNP  amLODipine (NORVASC) 10 MG tablet TAKE ONE TABLET BY MOUTH DAILY Patient taking differently: Take 10 mg by mouth in the morning. 02/03/22  Yes Marrian Salvage, FNP  anastrozole (ARIMIDEX) 1 MG tablet Take 1 mg by mouth every Monday, Wednesday, and Friday.   Yes [provider]  buPROPion (WELLBUTRIN XL) 150 MG 24 hr tablet TAKE ONE TABLET BY MOUTH DAILY Patient taking differently: Take 150 mg by mouth in the morning. 01/13/22  Yes Marrian Salvage, FNP  escitalopram (LEXAPRO) 20 MG tablet TAKE ONE TABLET BY MOUTH DAILY Patient taking differently: Take 20 mg by mouth in the morning. 07/15/21  Yes Marrian Salvage, FNP  gabapentin (NEURONTIN) 300 MG capsule TAKE ONE TO TWO CAPSULES BY MOUTH THREE TIMES A DAY AS NEEDED Patient taking differently: Take 600 mg by mouth 3 (three) times daily as needed (for pain). 01/31/22  Yes Gregor Hams, MD  ibuprofen (ADVIL) 200 MG tablet Take 600 mg by mouth every 6 (six) hours as needed for headache or mild pain.   Yes [provider]  lamoTRIgine (LAMICTAL) 100 MG tablet Take 1 tablet (100 mg total) by mouth 2 (two) times daily. 01/09/22  Yes Marcial Pacas, MD  loratadine (CLARITIN) 10 MG tablet Take 10 mg by mouth daily as needed for allergies or rhinitis.   Yes [provider]  omeprazole (PRILOSEC) 40 MG capsule Take 1 capsule (40 mg total) by mouth daily. Patient taking differently: Take 40 mg by mouth daily before breakfast. 09/20/21  Yes Marrian Salvage, FNP  spironolactone (ALDACTONE) 25 MG tablet TAKE ONE TABLET BY MOUTH DAILY Patient taking differently: Take 25 mg by mouth in the morning. 01/13/22  Yes Marrian Salvage, FNP  tadalafil (CIALIS) 20 MG tablet Take 20 mg by mouth in the morning.   Yes [provider]  valsartan-hydrochlorothiazide (DIOVAN-HCT) 320-25 MG tablet TAKE ONE TABLET BY MOUTH DAILY Patient  taking differently: Take 1 tablet by mouth in the morning. 10/07/21  Yes Marrian Salvage, FNP  XYOSTED 75 MG/0.5ML SOAJ Inject 75 mg into the skin every Wednesday.   Yes [provider]  cyclobenzaprine (FLEXERIL) 10 MG tablet Take 1 tablet (10 mg total) by mouth 3 (three) times daily as needed for muscle spasms. Patient not taking: Reported on 02/16/2022 11/22/21   Chevis Pretty, FNP  HYDROcodone-acetaminophen (NORCO/VICODIN) 5-325 MG tablet Take 1 tablet by mouth every 6 (six) hours as needed. Patient not taking: Reported on 02/16/2022 12/25/21   Gregor Hams, MD  ipratropium (ATROVENT) 0.03 % nasal spray Place 2 sprays into both nostrils every 12 (twelve) hours. Patient not taking: Reported on 02/16/2022 08/28/21   Mar Daring, PA-C  lamoTRIgine (LAMICTAL) 25 MG tablet 1 twice a day x1wk 2 twice a day x 2nd wk 3 twice a day x3rd wk Patient not taking: Reported on 02/16/2022 01/09/22  Marcial Pacas, MD  methocarbamol (ROBAXIN) 500 MG tablet Take 1 tablet (500 mg total) by mouth at bedtime as needed. Patient not taking: Reported on 02/16/2022 11/12/20   Marrian Salvage, FNP  naproxen (NAPROSYN) 500 MG tablet Take 1 tablet (500 mg total) by mouth 2 (two) times daily with a meal. Patient not taking: Reported on 02/16/2022 11/22/21   Chevis Pretty, FNP  nystatin-triamcinolone (MYCOLOG II) cream Apply 1 application topically 2 (two) times daily. Patient not taking: Reported on 02/16/2022 09/20/21   Marrian Salvage, FNP  Semaglutide-Weight Management (WEGOVY) 0.25 MG/0.5ML SOAJ Inject 0.25 mg into the skin once a week. Patient not taking: Reported on 02/16/2022 02/07/22   Marrian Salvage, FNP  triamcinolone cream (KENALOG) 0.1 % Apply 1 application topically 2 (two) times daily. Patient not taking: Reported on 02/16/2022 12/17/20   Marrian Salvage, FNP    Current Facility-Administered Medications  Medication Dose Route Frequency Provider Last Rate  Last Admin   acetaminophen (TYLENOL) tablet 650 mg  650 mg Oral Q6H PRN Elwyn Reach, MD       Or   acetaminophen (TYLENOL) suppository 650 mg  650 mg Rectal Q6H PRN Elwyn Reach, MD       allopurinol (ZYLOPRIM) tablet 300 mg  300 mg Oral Daily Elwyn Reach, MD       [START ON 02/18/2022] amLODipine (NORVASC) tablet 5 mg  5 mg Oral Daily Lavina Hamman, MD       anastrozole (ARIMIDEX) tablet 1 mg  1 mg Oral Q M,W,F Garba, Mohammad L, MD       buPROPion (WELLBUTRIN XL) 24 hr tablet 150 mg  150 mg Oral Daily Garba, Mohammad L, MD       dextrose 5 % in lactated ringers infusion   Intravenous Continuous Elwyn Reach, MD 100 mL/hr at 02/17/22 0620 New Bag at 02/17/22 0620   dicyclomine (BENTYL) capsule 10 mg  10 mg Oral TID AC Lavina Hamman, MD       enoxaparin (LOVENOX) injection 70 mg  70 mg Subcutaneous Q24H Garba, Mohammad L, MD   70 mg at 02/16/22 2054   escitalopram (LEXAPRO) tablet 20 mg  20 mg Oral Daily Gala Romney L, MD       gabapentin (NEURONTIN) capsule 600 mg  600 mg Oral TID Lavina Hamman, MD       HYDROcodone-acetaminophen (NORCO/VICODIN) 5-325 MG per tablet 1 tablet  1 tablet Oral Q6H PRN Lavina Hamman, MD       lamoTRIgine (LAMICTAL) tablet 100 mg  100 mg Oral BID Gala Romney L, MD   100 mg at 02/16/22 2059   loratadine (CLARITIN) tablet 10 mg  10 mg Oral Daily PRN Elwyn Reach, MD       methocarbamol (ROBAXIN) tablet 500 mg  500 mg Oral TID Lavina Hamman, MD       ondansetron [facility]) tablet 4 mg  4 mg Oral Q6H PRN Elwyn Reach, MD       Or   ondansetron (ZOFRAN) injection 4 mg  4 mg Intravenous Q6H PRN Elwyn Reach, MD       pantoprazole (PROTONIX) EC tablet 40 mg  40 mg Oral BID AC Lavina Hamman, MD       spironolactone (ALDACTONE) tablet 25 mg  25 mg Oral Daily Elwyn Reach, MD        Allergies as of 02/16/2022 - Review Complete 02/16/2022  Allergen Reaction Noted  Coconut oil Diarrhea, Nausea And Vomiting, and  Other (See Comments) 02/16/2022   Lisinopril Cough 11/12/2020    Review of Systems:    Constitutional: No weight loss, fever, chills, weakness or fatigue HEENT: Eyes: No change in vision               Ears, Nose, Throat:  No change in hearing or congestion Skin: No rash or itching Cardiovascular: No chest pain, chest pressure or palpitations   Respiratory: No SOB or cough Gastrointestinal: See HPI and otherwise negative Genitourinary: No dysuria or change in urinary frequency Neurological: No headache, dizziness or syncope Musculoskeletal: No new muscle or joint pain Hematologic: No bleeding or bruising Psychiatric: No history of depression or anxiety     Physical Exam:  Vital signs in last 24 hours: Temp:  [97.7 F (36.5 C)-98.4 F (36.9 C)] 97.7 F (36.5 C) (06/19 0557) Pulse Rate:  [74-114] 74 (06/19 0557) Resp:  [11-25] 16 (06/19 0557) BP: (99-181)/(57-114) 99/68 (06/19 0557) SpO2:  [89 %-98 %] 97 % (06/19 0557) Weight:  [145.2 kg] 145.2 kg (06/18 1316) Last BM Date : 02/16/22 Last BM recorded by nurses in past 5 days No data recorded  General:   Pleasant, well developed male in no acute distress Head:  Normocephalic and atraumatic. Eyes: sclerae anicteric,conjunctive pink  Heart:  regular rate and rhythm Pulm: Clear anteriorly; no wheezing Abdomen:  Soft, Obese AB, Active bowel sounds. mild tenderness in the entire abdomen. With guarding and Without rebound, positive carnett sign, ventral epigastric hernia. No organomegaly appreciated. Extremities:  Without edema. Msk:  Symmetrical without gross deformities. Peripheral pulses intact.  Neurologic:  Alert and  oriented x4;  No focal deficits.  Skin:   Dry and intact without significant lesions or rashes. Psychiatric:  Cooperative. Normal mood and affect.  LAB RESULTS: Recent Labs    02/16/22 1339 02/17/22 0349  WBC 16.2* 7.7  HGB 14.0 12.3*  HCT 43.5 38.6*  PLT 370 265   BMET Recent Labs    02/16/22 1339  02/17/22 0349  NA 138 139  K 3.8 3.8  CL 104 103  CO2 22 28  GLUCOSE 95 101*  BUN 18 15  CREATININE 1.04 0.96  CALCIUM 9.7 8.9   LFT Recent Labs    02/17/22 0349  PROT 6.3*  ALBUMIN 3.9  AST 17  ALT 29  ALKPHOS 56  BILITOT 0.7   PT/INR No results for input(s): "LABPROT", "INR" in the last 72 hours.  STUDIES: CT ABDOMEN PELVIS W CONTRAST  Result Date: 02/16/2022 CLINICAL DATA:  Severe abdominal pain and tenderness beginning today. Nausea and diarrhea. EXAM: CT ABDOMEN AND PELVIS WITH CONTRAST TECHNIQUE: Multidetector CT imaging of the abdomen and pelvis was performed using the standard protocol following bolus administration of intravenous contrast. RADIATION DOSE REDUCTION: This exam was performed according to the departmental dose-optimization program which includes automated exposure control, adjustment of the mA and/or kV according to patient size and/or use of iterative reconstruction technique. CONTRAST:  183m OMNIPAQUE IOHEXOL 300 MG/ML  SOLN COMPARISON:  12/30/2021 FINDINGS: Lower Chest: No acute findings. Hepatobiliary: No hepatic masses identified. Moderate diffuse hepatic steatosis is again seen, with focal areas of fatty sparing in the left hepatic lobe adjacent to the falciform ligament. Prior cholecystectomy. No evidence of biliary obstruction. Pancreas:  No mass or inflammatory changes. Spleen: Within normal limits in size and appearance. Adrenals/Urinary Tract: No masses identified. No evidence of ureteral calculi or hydronephrosis. Stomach/Bowel: No evidence of obstruction, inflammatory process or abnormal fluid  collections. Vascular/Lymphatic: No pathologically enlarged lymph nodes. No acute vascular findings. Aortic atherosclerotic calcification incidentally noted. Reproductive:  No mass or other significant abnormality. Other:  None. Musculoskeletal:  No suspicious bone lesions identified. IMPRESSION: No acute findings within the abdomen or pelvis. Stable moderate  hepatic steatosis. Aortic Atherosclerosis (ICD10-I70.0). Electronically Signed   By: Marlaine Hind M.D.   On: 02/16/2022 15:18     Vladimir Crofts  02/17/2022, 8:13 AM

## 2022-02-17 NOTE — Hospital Course (Signed)
46 year old male with past medical history of GERD, morbid obesity, HTN, depression, anxiety, chronic pain syndrome.  Presents to the hospital with complaints of central abdominal pain associated with some nausea without any vomiting. Work-up so far unremarkable for any acute intra-abdominal pathology. Possibility of musculoskeletal pain most likely.  GI was consulted.

## 2022-02-17 NOTE — Progress Notes (Signed)
  Progress Note Patient: Joshua Parrish BJS:[id number] DOB: August 31, 1976 DOA: 02/16/2022  DOS: the patient was seen and examined on 02/17/2022  Brief hospital course: 46 year old male with past medical history of GERD, morbid obesity, HTN, depression, anxiety, chronic pain syndrome.  Presents to the hospital with complaints of central abdominal pain associated with some nausea without any vomiting. Work-up so far unremarkable for any acute intra-abdominal pathology. Possibility of musculoskeletal pain most likely.  GI was consulted. Assessment and Plan: Acute on chronic abdominal pain. Patient actually has chronic abdominal pain and follows up with GI. Multiple work-up in the past has been unremarkable for concern including EGD and colonoscopy. Currently normal CT scan, normal lipase. No other acute abnormality. Appears to be musculoskeletal pain.  Will treat symptomatically. Appreciate GI consultation.  Essential hypertension. Continue blood pressure medications.  GERD. Continue PPI.  OSA. Morbid obesity. Body mass index is 47.26 kg/m.  Placing the pt at higher risk of poor outcomes. Continue CPAP nightly.  Chronic pain syndrome with osteoarthritis. For now continue pain medications.  Subjective: Continues to report severe abdominal pain.  Pain worsens with movement.  No nausea or vomiting.  Passing gas but no BM.  Physical Exam: Vitals:   02/17/22 0145 02/17/22 0557 02/17/22 1008 02/17/22 1811  BP: 112/72 99/68 102/69 (!) 115/100  Pulse: 77 74 62 75  Resp: '19 16 16 16  '$ Temp: 97.7 F (36.5 C) 97.7 F (36.5 C) 97.9 F (36.6 C) 97.7 F (36.5 C)  TempSrc: Oral Oral Oral Oral  SpO2: 96% 97% 93% 92%  Weight:      Height:       General: Appear in moderate distress; no visible Abnormal Neck Mass Or lumps, Conjunctiva normal Cardiovascular: S1 and S2 Present, no Murmur, Respiratory: good respiratory effort, Bilateral Air entry present and CTA, no Crackles, no wheezes Abdomen:  Bowel Sound present, diffusely tender Extremities: no Pedal edema Neurology: alert and oriented to time, place, and person  Gait not checked due to patient safety concerns   Data Reviewed: I have Reviewed nursing notes, Vitals, and Lab results since pt's last encounter. Pertinent lab results CBC and BMP I have ordered test including CBC and BMP I have discussed pt's care plan and test results with gastroenterology.   Family Communication: Wife at bedside  Disposition: Status is: Inpatient Remains inpatient appropriate because: Severe abdominal pain requiring IV narcotics.  Author: Berle Mull, MD 02/17/2022 8:01 PM  Please look on [email or web address] to find out who is on call.

## 2022-02-17 NOTE — Telephone Encounter (Signed)
Who Is Calling Patient / Member / Family / Caregiver Call Type Triage / Clinical Relationship To Patient Self Return Phone Number [phone] (Primary) Chief Complaint SEVERE ABDOMINAL PAIN - Severe pain in abdomen Reason for Call Symptomatic / Request for Health Information Initial Comment Caller states he has a hernia and right now he feels like he has to vomit and is having cold chills and is wondering if he should go to the er or wait to schedule an appt.Caller states he is also having severe abdominal pain Translation No Nurse Assessment Nurse: Zenia Resides, RN, Diane Date/Time (Eastern Time): 02/16/2022 12:14:52 PM Confirm and document reason for call. If symptomatic, describe symptoms. ---Caller states he has an umbilical hernia and severe abd. pain and nausea and cold chills, but feels warm. Started last night, but worse this morning. Also having diarrhea that won't stop. Temp is 99.  02/16/2022 12:17:30 PM Go to ED Now Yes Zenia Resides, RN, Diane

## 2022-02-17 NOTE — Telephone Encounter (Signed)
Patient is currently in the hospital.

## 2022-02-18 DIAGNOSIS — R1084 Generalized abdominal pain: Secondary | ICD-10-CM | POA: Diagnosis not present

## 2022-02-18 MED ORDER — POLYETHYLENE GLYCOL 3350 17 G PO PACK: 17.0000 g | PACK | Freq: Every day | ORAL | 0 refills | Status: DC

## 2022-02-18 MED ORDER — DOCUSATE SODIUM 100 MG PO CAPS: 100.0000 mg | ORAL_CAPSULE | Freq: Two times a day (BID) | ORAL | 2 refills | Status: DC

## 2022-02-18 MED ORDER — DICYCLOMINE HCL 10 MG PO CAPS: 10.0000 mg | ORAL_CAPSULE | Freq: Three times a day (TID) | ORAL | 0 refills | Status: DC

## 2022-02-18 MED ORDER — GABAPENTIN 300 MG PO CAPS: 600.0000 mg | ORAL_CAPSULE | Freq: Three times a day (TID) | ORAL | 0 refills | Status: DC

## 2022-02-18 MED ORDER — BISACODYL 10 MG RE SUPP: 10.0000 mg | RECTAL | 0 refills | Status: DC | PRN

## 2022-02-18 MED ORDER — HYDROCODONE-ACETAMINOPHEN 5-325 MG PO TABS: 1.0000 | ORAL_TABLET | Freq: Four times a day (QID) | ORAL | 0 refills | Status: DC | PRN

## 2022-02-18 MED ORDER — METHOCARBAMOL 500 MG PO TABS: 500.0000 mg | ORAL_TABLET | Freq: Three times a day (TID) | ORAL | 0 refills | Status: DC

## 2022-02-18 MED ORDER — OMEPRAZOLE 40 MG PO CPDR: 40.0000 mg | DELAYED_RELEASE_CAPSULE | Freq: Every day | ORAL | 0 refills | Status: DC

## 2022-02-18 NOTE — Clinical Social Work Note (Signed)
  Transition of Care [facility]) Screening Note   Patient Details  Name: Joshua Parrish Date of Birth: June 13, 1976   [facility] CM/SW Contact:    Ross Ludwig, LCSW Phone Number: 02/18/2022, 3:28 PM    Transition of Care Department [facility]) has reviewed patient and no TOC needs have been identified at this time. We will continue to monitor patient advancement through interdisciplinary progression rounds. If new patient transition needs arise, please place a TOC consult.

## 2022-02-18 NOTE — Progress Notes (Signed)
Discharge instructions given to patient and all questions were answered.  

## 2022-02-19 ENCOUNTER — Telehealth: Payer: Self-pay

## 2022-02-19 NOTE — Telephone Encounter (Addendum)
Transition Care Management Unsuccessful Follow-up Telephone Call  Date of discharge and from where:  [place] 02-18-22 Dx: Abdominal pain   Attempts:  1st Attempt  Reason for unsuccessful TCM follow-up call:  Unable to leave message  Transition Care Management Unsuccessful Follow-up Telephone Call  Date of discharge and from where:  [place] 02-18-22 Dx: Abdominal pain   Attempts:  2nd Attempt  Reason for unsuccessful TCM follow-up call:  Left voice message

## 2022-02-20 ENCOUNTER — Other Ambulatory Visit: Payer: Self-pay

## 2022-02-20 ENCOUNTER — Emergency Department (HOSPITAL_COMMUNITY)
Admission: EM | Admit: 2022-02-20 | Discharge: 2022-02-20 | Disposition: A | Discharge status: Home / Self Care | Payer: 59 | Attending: Emergency Medicine | Admitting: Emergency Medicine

## 2022-02-20 ENCOUNTER — Encounter (HOSPITAL_COMMUNITY): Payer: Self-pay

## 2022-02-20 ENCOUNTER — Telehealth: Payer: Self-pay | Admitting: Family

## 2022-02-20 ENCOUNTER — Emergency Department (HOSPITAL_COMMUNITY): Payer: 59

## 2022-02-20 DIAGNOSIS — R109 Unspecified abdominal pain: Secondary | ICD-10-CM

## 2022-02-20 DIAGNOSIS — R1032 Left lower quadrant pain: Secondary | ICD-10-CM | POA: Diagnosis present

## 2022-02-20 DIAGNOSIS — R1012 Left upper quadrant pain: Secondary | ICD-10-CM | POA: Insufficient documentation

## 2022-02-20 DIAGNOSIS — R0902 Hypoxemia: Secondary | ICD-10-CM | POA: Insufficient documentation

## 2022-02-20 DIAGNOSIS — R000 Tachycardia, unspecified: Secondary | ICD-10-CM | POA: Insufficient documentation

## 2022-02-20 LAB — CBC WITH DIFFERENTIAL/PLATELET
Abs Immature Granulocytes: 0.06 10*3/uL (ref 0.00–0.07)
Basophils Absolute: 0 10*3/uL (ref 0.0–0.1)
Basophils Relative: 0 %
Eosinophils Absolute: 0.2 10*3/uL (ref 0.0–0.5)
Eosinophils Relative: 2 %
HCT: 42.9 % (ref 39.0–52.0)
Hemoglobin: 14.2 g/dL (ref 13.0–17.0)
Immature Granulocytes: 1 %
Lymphocytes Relative: 24 %
Lymphs Abs: 2 10*3/uL (ref 0.7–4.0)
MCH: 30.2 pg (ref 26.0–34.0)
MCHC: 33.1 g/dL (ref 30.0–36.0)
MCV: 91.3 fL (ref 80.0–100.0)
Monocytes Absolute: 0.5 10*3/uL (ref 0.1–1.0)
Monocytes Relative: 6 %
Neutro Abs: 5.8 10*3/uL (ref 1.7–7.7)
Neutrophils Relative %: 67 %
Platelets: 330 10*3/uL (ref 150–400)
RBC: 4.7 MIL/uL (ref 4.22–5.81)
RDW: 13.5 % (ref 11.5–15.5)
WBC: 8.6 10*3/uL (ref 4.0–10.5)
nRBC: 0 % (ref 0.0–0.2)

## 2022-02-20 LAB — URINALYSIS, ROUTINE W REFLEX MICROSCOPIC
Bacteria, UA: NONE SEEN
Bilirubin Urine: NEGATIVE
Glucose, UA: NEGATIVE mg/dL
Hgb urine dipstick: NEGATIVE
Ketones, ur: NEGATIVE mg/dL
Leukocytes,Ua: NEGATIVE
Nitrite: NEGATIVE
Protein, ur: 30 mg/dL — AB
Specific Gravity, Urine: 1.014 (ref 1.005–1.030)
pH: 7 (ref 5.0–8.0)

## 2022-02-20 LAB — COMPREHENSIVE METABOLIC PANEL
ALT: 47 U/L — ABNORMAL HIGH (ref 0–44)
AST: 42 U/L — ABNORMAL HIGH (ref 15–41)
Albumin: 4.2 g/dL (ref 3.5–5.0)
Alkaline Phosphatase: 72 U/L (ref 38–126)
Anion gap: 13 (ref 5–15)
BUN: 14 mg/dL (ref 6–20)
CO2: 23 mmol/L (ref 22–32)
Calcium: 9.5 mg/dL (ref 8.9–10.3)
Chloride: 101 mmol/L (ref 98–111)
Creatinine, Ser: 1.14 mg/dL (ref 0.61–1.24)
GFR, Estimated: >60 mL/min (ref >60)
Glucose, Bld: 106 mg/dL — ABNORMAL HIGH (ref 70–99)
Potassium: 4.4 mmol/L (ref 3.5–5.1)
Sodium: 137 mmol/L (ref 135–145)
Total Bilirubin: 0.8 mg/dL (ref 0.3–1.2)
Total Protein: 7.1 g/dL (ref 6.5–8.1)

## 2022-02-20 LAB — LIPASE, BLOOD: Lipase: 46 U/L (ref 11–51)

## 2022-02-20 MED ORDER — HYDROMORPHONE HCL 1 MG/ML IJ SOLN
1.0000 mg | Freq: Once | INTRAMUSCULAR | Status: AC
  Administered 2022-02-20: 1 mg via INTRAVENOUS
  Filled 2022-02-20: qty 1

## 2022-02-20 MED ORDER — DIPHENHYDRAMINE HCL 50 MG/ML IJ SOLN
12.5000 mg | Freq: Once | INTRAMUSCULAR | Status: AC
  Administered 2022-02-20: 12.5 mg via INTRAVENOUS
  Filled 2022-02-20: qty 1

## 2022-02-20 MED ORDER — IOHEXOL 350 MG/ML SOLN
100.0000 mL | Freq: Once | INTRAVENOUS | Status: AC | PRN
  Administered 2022-02-20: 100 mL via INTRAVENOUS

## 2022-02-20 MED ORDER — METOCLOPRAMIDE HCL 5 MG/ML IJ SOLN
5.0000 mg | Freq: Once | INTRAMUSCULAR | Status: AC
  Administered 2022-02-20: 5 mg via INTRAVENOUS
  Filled 2022-02-20: qty 2

## 2022-02-20 MED ORDER — ONDANSETRON HCL 4 MG/2ML IJ SOLN
4.0000 mg | Freq: Once | INTRAMUSCULAR | Status: AC
  Administered 2022-02-20: 4 mg via INTRAVENOUS
  Filled 2022-02-20: qty 2

## 2022-02-20 MED ORDER — FAMOTIDINE IN NACL 20-0.9 MG/50ML-% IV SOLN
20.0000 mg | Freq: Once | INTRAVENOUS | Status: AC
  Administered 2022-02-20: 20 mg via INTRAVENOUS
  Filled 2022-02-20: qty 50

## 2022-02-20 MED ORDER — OXYCODONE HCL 5 MG PO TABS: 5.0000 mg | ORAL_TABLET | Freq: Four times a day (QID) | ORAL | 0 refills | Status: DC | PRN

## 2022-02-20 MED ORDER — SODIUM CHLORIDE 0.9 % IV BOLUS
500.0000 mL | Freq: Once | INTRAVENOUS | Status: AC
  Administered 2022-02-20: 500 mL via INTRAVENOUS

## 2022-02-20 NOTE — ED Triage Notes (Signed)
Pt to ED via EMS from home c/o abdominal pain that started today. HX of the same, HX inguinal hernia. #20 R. Hand. Zofran, fentanyl given by EMS,  Last VS: hr 130, 160/110, 98%RA

## 2022-02-20 NOTE — ED Provider Notes (Signed)
[facility]ERGENCY DEPARTMENT Provider Note   CSN: [id number] Arrival date & time: 02/20/22  1816     History  Chief Complaint  Patient presents with   Abdominal Pain    Joshua Parrish is a 46 y.o. male.  HPI  46 year old male presents to the emergency department with ongoing abdominal pain.  Patient's past medical history includes cholecystectomy, appendectomy he reports possible history of umbilical hernia but this is not confirmed on chart review.  A couple days ago patient had right lower quadrant/mid abdominal pain.  Patient recently had admission and discharged 2 days ago for same complaint.  He states his symptoms minimally improved while here in the hospital, was plan for outpatient PCP/GI follow-up.  He states since being home his pain is continued, become more severe, not controlled with oral meds.  He is also complaining of hypoxia and was placed on nasal cannula prior to arrival.  Patient denies any genitourinary symptoms.  No diarrhea.  Home Medications Prior to Admission medications   Medication Sig Start Date End Date Taking? Authorizing Provider  allopurinol (ZYLOPRIM) 300 MG tablet TAKE ONE TABLET BY MOUTH DAILY Patient taking differently: Take 300 mg by mouth daily. 10/07/21   Marrian Salvage, FNP  anastrozole (ARIMIDEX) 1 MG tablet Take 1 mg by mouth every Monday, Wednesday, and Friday.    [provider]  bisacodyl (DULCOLAX) 10 MG suppository Place 1 suppository (10 mg total) rectally as needed for moderate constipation. 02/18/22   Lavina Hamman, MD  buPROPion (WELLBUTRIN XL) 150 MG 24 hr tablet TAKE ONE TABLET BY MOUTH DAILY Patient taking differently: Take 150 mg by mouth in the morning. 01/13/22   Marrian Salvage, FNP  dicyclomine (BENTYL) 10 MG capsule Take 1 capsule (10 mg total) by mouth 3 (three) times daily before meals. 02/18/22   Lavina Hamman, MD  docusate sodium (COLACE) 100 MG capsule Take 1 capsule (100 mg  total) by mouth 2 (two) times daily. 02/18/22 02/18/23  Lavina Hamman, MD  escitalopram (LEXAPRO) 20 MG tablet TAKE ONE TABLET BY MOUTH DAILY 02/17/22   Debbrah Alar, NP  gabapentin (NEURONTIN) 300 MG capsule Take 2 capsules (600 mg total) by mouth 3 (three) times daily. 02/18/22   Lavina Hamman, MD  HYDROcodone-acetaminophen (NORCO/VICODIN) 5-325 MG tablet Take 1 tablet by mouth every 6 (six) hours as needed. 02/18/22   Lavina Hamman, MD  ipratropium (ATROVENT) 0.03 % nasal spray Place 2 sprays into both nostrils every 12 (twelve) hours. Patient not taking: Reported on 02/16/2022 08/28/21   Mar Daring, PA-C  lamoTRIgine (LAMICTAL) 100 MG tablet Take 1 tablet (100 mg total) by mouth 2 (two) times daily. 01/09/22   Marcial Pacas, MD  loratadine (CLARITIN) 10 MG tablet Take 10 mg by mouth daily as needed for allergies or rhinitis.    [provider]  methocarbamol (ROBAXIN) 500 MG tablet Take 1 tablet (500 mg total) by mouth 3 (three) times daily. 02/18/22   Lavina Hamman, MD  omeprazole (PRILOSEC) 40 MG capsule Take 1 capsule (40 mg total) by mouth daily before breakfast. 02/18/22   Lavina Hamman, MD  polyethylene glycol (MIRALAX) 17 g packet Take 17 g by mouth daily. 02/18/22   Lavina Hamman, MD  tadalafil (CIALIS) 20 MG tablet Take 20 mg by mouth in the morning.    [provider]  triamcinolone cream (KENALOG) 0.1 % Apply 1 application topically 2 (two) times daily. Patient not  taking: Reported on 02/16/2022 12/17/20   Marrian Salvage, FNP  XYOSTED 75 MG/0.5ML SOAJ Inject 75 mg into the skin every Wednesday.    [provider]      Allergies    Coconut oil and Lisinopril    Review of Systems   Review of Systems  Constitutional:  Positive for fatigue. Negative for fever.  Respiratory:  Negative for shortness of breath.   Cardiovascular:  Negative for chest pain.  Gastrointestinal:  Positive for abdominal pain. Negative for diarrhea and  vomiting.  Musculoskeletal:  Negative for back pain.  Skin:  Negative for rash.  Neurological:  Negative for headaches.    Physical Exam Updated Vital Signs BP 126/71   Pulse (!) 110   Temp 98.3 F (36.8 C) (Oral)   Resp 18   Ht '5\' 9"'$  (1.753 m)   Wt (!) 145.2 kg   SpO2 93%   BMI 47.26 kg/m  Physical Exam Vitals and nursing note reviewed.  Constitutional:      Appearance: Normal appearance.  HENT:     Head: Normocephalic.     Mouth/Throat:     Mouth: Mucous membranes are moist.  Cardiovascular:     Rate and Rhythm: Normal rate.  Pulmonary:     Effort: Pulmonary effort is normal. No respiratory distress.  Abdominal:     General: Bowel sounds are normal. There is no distension.     Palpations: Abdomen is soft.     Tenderness: There is abdominal tenderness in the periumbilical area, left upper quadrant and left lower quadrant. There is guarding. There is no rebound.     Hernia: No hernia is present.  Skin:    General: Skin is warm.  Neurological:     Mental Status: He is alert and oriented to person, place, and time. Mental status is at baseline.  Psychiatric:        Mood and Affect: Mood normal.     ED Results / Procedures / Treatments   Labs (all labs ordered are listed, but only abnormal results are displayed) Labs Reviewed  CBC WITH DIFFERENTIAL/PLATELET  COMPREHENSIVE METABOLIC PANEL  LIPASE, BLOOD  URINALYSIS, ROUTINE W REFLEX MICROSCOPIC    EKG None  Radiology No results found.  Procedures Procedures    Medications Ordered in ED Medications  ondansetron (ZOFRAN) injection 4 mg (has no administration in time range)  HYDROmorphone (DILAUDID) injection 1 mg (has no administration in time range)  sodium chloride 0.9 % bolus 500 mL (has no administration in time range)  famotidine (PEPCID) IVPB 20 mg premix (has no administration in time range)    ED Course/ Medical Decision Making/ A&P                           Medical Decision Making Amount  and/or Complexity of Data Reviewed Labs: ordered. Radiology: ordered.  Risk Prescription drug management.   46 year old male presents emergency department with abdominal pain.  Recent admission for same complaint, reassuring work-up and was planned for outpatient follow-up.  But states the pain returned.  Patient was tachycardic on arrival.  Abdomen tender in the mid and left quadrants.  No palpable hernia.  Blood work reassuring without any acute abnormalities.  Also of note patient at 1 point was hypoxic on room air with EMS.  Due to recent hospitalization with hypoxia CT PE study was pursued in addition to CT of the abdomen pelvis.  No pulmonary embolus identified on exam.  Patient  was weaned to room air.  After IV medication patient feels improved.  No acute finding in the abdomen or pelvis. Doubt mesenteric ischemia given course, recent negative inpatient work up with GI consult, current presentation. Believe the patient needs to follow-up with gastroenterology, possible repeat endoscopies and further treatment.  No indication of emergent medical condition/need for admission.  Patient at this time appears safe and stable for discharge and close outpatient follow up. Discharge plan and strict return to ED precautions discussed, patient verbalizes understanding and agreement.        Final Clinical Impression(s) / ED Diagnoses Final diagnoses:  None    Rx / DC Orders ED Discharge Orders     None         Lorelle Gibbs, DO 02/20/22 2324

## 2022-02-20 NOTE — Telephone Encounter (Signed)
Langley Gauss (spouse) called stating that pt's abdominal pain has returned after his hospital visit to [facility]. Pt was transferred to triage nurse for further eval.

## 2022-02-20 NOTE — Discharge Instructions (Signed)
You have been seen and discharged from the emergency department.  Your blood work, CT of the chest/abdomen/pelvis showed no acute findings.  I believe need to follow-up with gastroenterology for further evaluation and treatment.  Take home medications as prescribed. If you have any worsening symptoms or further concerns for your health please return to an emergency department for further evaluation.

## 2022-02-20 NOTE — ED Notes (Signed)
Provider made aware of the patient's discharge vitals. Provider reports that she is aware. Patient reports no distress despite vital signs.

## 2022-02-20 NOTE — ED Notes (Signed)
Patient transported to CT 

## 2022-02-20 NOTE — ED Notes (Signed)
Provider notified of the patient's pain score

## 2022-02-20 NOTE — Telephone Encounter (Signed)
Transition Care Management Follow-up Telephone Call Date of discharge and from where: 02/18/2022 - [facility] - abdominal pain How have you been since you were released from the hospital? Doing better, still has some nausea and tired Any questions or concerns? No  Items Reviewed: Did the pt receive and understand the discharge instructions provided? Yes  Medications obtained and verified? Yes  Other? No  Any new allergies since your discharge? No  Dietary orders reviewed? Yes Do you have support at home? Yes   Home Care and Equipment/Supplies: Were home health services ordered? No  Were any new equipment or medical supplies ordered?  No   Functional Questionnaire: (I = Independent and D = Dependent) ADLs: I  Bathing/Dressing- I  Meal Prep- I  Eating- I  Maintaining continence- I  Transferring/Ambulation- I  Managing Meds- I  Follow up appointments reviewed:  PCP Hospital f/u appt confirmed? Yes  Scheduled to see Marrian Salvage on 6/27 @ 11:30. [facility] f/u appt confirmed? No   Are transportation arrangements needed? No  If their condition worsens, is the pt aware to call PCP or go to the Emergency Dept.? Yes Was the patient provided with contact information for the PCP's office or ED? Yes Was to pt encouraged to call back with questions or concerns? Yes

## 2022-02-21 ENCOUNTER — Telehealth: Payer: Self-pay

## 2022-02-21 NOTE — Telephone Encounter (Signed)
Pt seen at ED 

## 2022-02-25 ENCOUNTER — Ambulatory Visit (INDEPENDENT_AMBULATORY_CARE_PROVIDER_SITE_OTHER): Payer: 59 | Admitting: Family

## 2022-02-25 VITALS — BP 105/60 | HR 92 | Temp 98.3°F | Resp 16 | Ht 69.0 in | Wt 321.0 lb

## 2022-02-25 DIAGNOSIS — R1010 Upper abdominal pain, unspecified: Secondary | ICD-10-CM

## 2022-02-25 DIAGNOSIS — K4090 Unilateral inguinal hernia, without obstruction or gangrene, not specified as recurrent: Secondary | ICD-10-CM

## 2022-02-25 DIAGNOSIS — Z6841 Body Mass Index (BMI) 40.0 and over, adult: Secondary | ICD-10-CM

## 2022-02-25 DIAGNOSIS — I10 Essential (primary) hypertension: Secondary | ICD-10-CM

## 2022-03-07 ENCOUNTER — Encounter: Payer: Self-pay | Admitting: Family

## 2022-03-12 ENCOUNTER — Other Ambulatory Visit: Payer: Self-pay | Admitting: Family

## 2022-03-12 ENCOUNTER — Ambulatory Visit: Payer: Self-pay | Admitting: General Surgery

## 2022-03-13 ENCOUNTER — Ambulatory Visit: Payer: 59 | Admitting: Family Medicine

## 2022-03-19 NOTE — Patient Instructions (Signed)
DUE TO COVID-19 ONLY TWO VISITORS  (aged 47 and older)  ARE ALLOWED TO COME WITH YOU AND STAY IN THE WAITING ROOM ONLY DURING PRE OP AND PROCEDURE.   **NO VISITORS ARE ALLOWED IN THE SHORT STAY AREA OR RECOVERY ROOM!!**  IF YOU WILL BE ADMITTED INTO THE HOSPITAL YOU ARE ALLOWED ONLY FOUR SUPPORT PEOPLE DURING VISITATION HOURS ONLY (7 AM -8PM)   The support person(s) must pass our screening, gel in and out, and wear a mask at all times, including in the patient's room. Patients must also wear a mask when staff or their support person are in the room. Visitors GUEST BADGE MUST BE WORN VISIBLY  One adult visitor may remain with you overnight and MUST be in the room by 8 P.M.     Your procedure is scheduled on: 03/31/22   Report to [facility] Main Entrance    Report to admitting at: 1:15 PM   Call this number if you have problems the morning of surgery [phone]   Eat a light diet the day before surgery.  Examples including soups, broths, toast, yogurt, mashed potatoes.  Things to avoid include carbonated beverages (fizzy beverages), raw fruits and raw vegetables, or beans.   If your bowels are filled with gas, your surgeon will have difficulty visualizing your pelvic organs which increases your surgical risks.  Do not eat food :After Midnight.   After Midnight you may have the following liquids until : 12:30 PM DAY OF SURGERY  Water Black Coffee (sugar ok, NO MILK/CREAM OR CREAMERS)  Tea (sugar ok, NO MILK/CREAM OR CREAMERS) regular and decaf                             Plain Jell-O (NO RED)                                           Fruit ices (not with fruit pulp, NO RED)                                     Popsicles (NO RED)                                                                  Juice: apple, WHITE grape, WHITE cranberry Sports drinks like Gatorade (NO RED)   Drink  Ensure drink AT :12:30 PM the day of surgery.     The day of surgery:  Drink ONE (1)  Pre-Surgery Clear Ensure or G2 at AM the morning of surgery. Drink in one sitting. Do not sip.  This drink was given to you during your hospital  pre-op appointment visit. Nothing else to drink after completing the  Pre-Surgery Clear Ensure or G2.          If you have questions, please contact your surgeon's office.    Oral Hygiene is also important to reduce your risk of infection.  Remember - BRUSH YOUR TEETH THE MORNING OF SURGERY WITH YOUR REGULAR TOOTHPASTE   Do NOT smoke after Midnight   Take these medicines the morning of surgery with A SIP OF WATER: gabapentin,lamotrigine,bupropion,escitalopram,loratadine,anastrozole,amlodipine,allopurinol,bentyl,omeprazole.  DO NOT TAKE ANY ORAL DIABETIC MEDICATIONS DAY OF YOUR SURGERY  Bring CPAP mask and tubing day of surgery.                              You may not have any metal on your body including hair pins, jewelry, and body piercing             Do not wear lotions, powders, perfumes/cologne, or deodorant              Men may shave face and neck.   Do not bring valuables to the hospital. [place].   Contacts, dentures or bridgework may not be worn into surgery.   Bring small overnight bag day of surgery.   DO NOT [place]. PHARMACY WILL DISPENSE MEDICATIONS LISTED ON YOUR MEDICATION LIST TO YOU DURING YOUR ADMISSION [place]!    Patients discharged on the day of surgery will not be allowed to drive home.  Someone NEEDS to stay with you for the first 24 hours after anesthesia.   Special Instructions: Bring a copy of your healthcare power of attorney and living will documents         the day of surgery if you haven't scanned them before.              Please read over the following fact sheets you were given: IF YOU HAVE QUESTIONS ABOUT YOUR PRE-OP INSTRUCTIONS PLEASE CALL [phone]     [facility] -  Preparing for Surgery Before surgery, you can play an important role.  Because skin is not sterile, your skin needs to be as free of germs as possible.  You can reduce the number of germs on your skin by washing with CHG (chlorahexidine gluconate) soap before surgery.  CHG is an antiseptic cleaner which kills germs and bonds with the skin to continue killing germs even after washing. Please DO NOT use if you have an allergy to CHG or antibacterial soaps.  If your skin becomes reddened/irritated stop using the CHG and inform your nurse when you arrive at Short Stay. Do not shave (including legs and underarms) for at least 48 hours prior to the first CHG shower.  You may shave your face/neck. Please follow these instructions carefully:  1.  Shower with CHG Soap the night before surgery and the  morning of Surgery.  2.  If you choose to wash your hair, wash your hair first as usual with your  normal  shampoo.  3.  After you shampoo, rinse your hair and body thoroughly to remove the  shampoo.                           4.  Use CHG as you would any other liquid soap.  You can apply chg directly  to the skin and wash                       Gently with a scrungie or clean washcloth.  5.  Apply the CHG Soap  to your body ONLY FROM THE NECK DOWN.   Do not use on face/ open                           Wound or open sores. Avoid contact with eyes, ears mouth and genitals (private parts).                       Wash face,  Genitals (private parts) with your normal soap.             6.  Wash thoroughly, paying special attention to the area where your surgery  will be performed.  7.  Thoroughly rinse your body with warm water from the neck down.  8.  DO NOT shower/wash with your normal soap after using and rinsing off  the CHG Soap.                9.  Pat yourself dry with a clean towel.            10.  Wear clean pajamas.            11.  Place clean sheets on your bed the night of your first shower and do not  sleep  with pets. Day of Surgery : Do not apply any lotions/deodorants the morning of surgery.  Please wear clean clothes to the hospital/surgery center.  FAILURE TO FOLLOW THESE INSTRUCTIONS MAY RESULT IN THE CANCELLATION OF YOUR SURGERY PATIENT SIGNATURE_________________________________  NURSE SIGNATURE__________________________________  ________________________________________________________________________

## 2022-03-20 ENCOUNTER — Encounter (HOSPITAL_COMMUNITY)
Admission: RE | Admit: 2022-03-20 | Discharge: 2022-03-20 | Disposition: A | Discharge status: Home / Self Care | Payer: 59 | Source: Ambulatory Visit | Attending: General Surgery | Admitting: General Surgery

## 2022-03-20 ENCOUNTER — Other Ambulatory Visit: Payer: Self-pay

## 2022-03-20 ENCOUNTER — Encounter (HOSPITAL_COMMUNITY): Payer: Self-pay

## 2022-03-20 VITALS — BP 128/75 | HR 85 | Temp 98.5°F | Ht 69.0 in | Wt 315.0 lb

## 2022-03-20 DIAGNOSIS — I10 Essential (primary) hypertension: Secondary | ICD-10-CM | POA: Insufficient documentation

## 2022-03-20 DIAGNOSIS — Z01812 Encounter for preprocedural laboratory examination: Secondary | ICD-10-CM | POA: Insufficient documentation

## 2022-03-20 HISTORY — DX: Anxiety disorder, unspecified: F41.9

## 2022-03-20 HISTORY — DX: Essential (primary) hypertension: I10

## 2022-03-20 HISTORY — DX: Sleep apnea, unspecified: G47.30

## 2022-03-20 LAB — BASIC METABOLIC PANEL
Anion gap: 9 (ref 5–15)
BUN: 15 mg/dL (ref 6–20)
CO2: 28 mmol/L (ref 22–32)
Calcium: 9.9 mg/dL (ref 8.9–10.3)
Chloride: 102 mmol/L (ref 98–111)
Creatinine, Ser: 1.11 mg/dL (ref 0.61–1.24)
GFR, Estimated: >60 mL/min (ref >60)
Glucose, Bld: 91 mg/dL (ref 70–99)
Potassium: 4.3 mmol/L (ref 3.5–5.1)
Sodium: 139 mmol/L (ref 135–145)

## 2022-03-20 LAB — CBC
HCT: 39.8 % (ref 39.0–52.0)
Hemoglobin: 12.8 g/dL — ABNORMAL LOW (ref 13.0–17.0)
MCH: 29.4 pg (ref 26.0–34.0)
MCHC: 32.2 g/dL (ref 30.0–36.0)
MCV: 91.5 fL (ref 80.0–100.0)
Platelets: 294 10*3/uL (ref 150–400)
RBC: 4.35 MIL/uL (ref 4.22–5.81)
RDW: 13.3 % (ref 11.5–15.5)
WBC: 7.9 10*3/uL (ref 4.0–10.5)
nRBC: 0 % (ref 0.0–0.2)

## 2022-03-20 NOTE — Progress Notes (Signed)
For Short Stay: [place] appointment date: Date of COVID positive in last 24 days:  Bowel Prep reminder:   For Anesthesia: PCP - Dierdre Forth: FNP Cardiologist -   Chest x-ray -  EKG - 02/24/22 Stress Test -  ECHO -  Cardiac Cath -  Pacemaker/ICD device last checked: Pacemaker orders received: Device Rep notified:  Spinal Cord Stimulator:  Sleep Study - Yes CPAP - Yes  Fasting Blood Sugar -  Checks Blood Sugar _____ times a day Date and result of last Hgb A1c-  Blood Thinner Instructions: Aspirin Instructions: Last Dose:  Activity level: Can go up a flight of stairs and activities of daily living without stopping and without chest pain and/or shortness of breath   Able to exercise without chest pain and/or shortness of breath   Unable to go up a flight of stairs without chest pain and/or shortness of breath     Anesthesia review: Hx: HTN,OSA(CPAP)  Patient denies shortness of breath, fever, cough and chest pain at PAT appointment   Patient verbalized understanding of instructions that were given to them at the PAT appointment. Patient was also instructed that they will need to review over the PAT instructions again at home before surgery.

## 2022-03-21 ENCOUNTER — Other Ambulatory Visit: Payer: Self-pay | Admitting: Family

## 2022-03-21 MED ORDER — ESCITALOPRAM OXALATE 20 MG PO TABS
20.0000 mg | ORAL_TABLET | Freq: Every day | ORAL | 3 refills | Status: DC
Start: 2022-03-21 — End: 2022-11-12

## 2022-03-31 ENCOUNTER — Ambulatory Visit (HOSPITAL_COMMUNITY): Payer: 59 | Admitting: Anesthesiology

## 2022-03-31 ENCOUNTER — Encounter (HOSPITAL_COMMUNITY)
Admission: RE | Disposition: A | Discharge status: Home / Self Care | Payer: Self-pay | Source: Ambulatory Visit | Attending: General Surgery

## 2022-03-31 ENCOUNTER — Other Ambulatory Visit: Payer: Self-pay

## 2022-03-31 ENCOUNTER — Encounter (HOSPITAL_COMMUNITY): Payer: Self-pay | Admitting: General Surgery

## 2022-03-31 ENCOUNTER — Ambulatory Visit (HOSPITAL_COMMUNITY)
Admission: RE | Admit: 2022-03-31 | Discharge: 2022-03-31 | Disposition: A | Discharge status: Home / Self Care | Payer: 59 | Source: Ambulatory Visit | Attending: General Surgery | Admitting: General Surgery

## 2022-03-31 ENCOUNTER — Ambulatory Visit (HOSPITAL_BASED_OUTPATIENT_CLINIC_OR_DEPARTMENT_OTHER): Payer: 59 | Admitting: Anesthesiology

## 2022-03-31 DIAGNOSIS — G4733 Obstructive sleep apnea (adult) (pediatric): Secondary | ICD-10-CM | POA: Diagnosis not present

## 2022-03-31 DIAGNOSIS — G4730 Sleep apnea, unspecified: Secondary | ICD-10-CM | POA: Diagnosis not present

## 2022-03-31 DIAGNOSIS — Z9989 Dependence on other enabling machines and devices: Secondary | ICD-10-CM | POA: Diagnosis not present

## 2022-03-31 DIAGNOSIS — F17210 Nicotine dependence, cigarettes, uncomplicated: Secondary | ICD-10-CM | POA: Insufficient documentation

## 2022-03-31 DIAGNOSIS — I10 Essential (primary) hypertension: Secondary | ICD-10-CM | POA: Insufficient documentation

## 2022-03-31 DIAGNOSIS — Z8719 Personal history of other diseases of the digestive system: Secondary | ICD-10-CM

## 2022-03-31 DIAGNOSIS — K4090 Unilateral inguinal hernia, without obstruction or gangrene, not specified as recurrent: Secondary | ICD-10-CM

## 2022-03-31 DIAGNOSIS — K219 Gastro-esophageal reflux disease without esophagitis: Secondary | ICD-10-CM | POA: Insufficient documentation

## 2022-03-31 HISTORY — DX: Personal history of other diseases of the digestive system: Z87.19

## 2022-03-31 SURGERY — HERNIORRHAPHY, INGUINAL, ROBOT-ASSISTED, LAPAROSCOPIC
Anesthesia: General | Laterality: Right

## 2022-03-31 MED ORDER — IBUPROFEN 800 MG PO TABS: 800.0000 mg | ORAL_TABLET | Freq: Three times a day (TID) | ORAL | 0 refills | Status: DC | PRN

## 2022-03-31 MED ORDER — ROCURONIUM BROMIDE 10 MG/ML (PF) SYRINGE
PREFILLED_SYRINGE | INTRAVENOUS | Status: DC | PRN
  Administered 2022-03-31: 70 mg via INTRAVENOUS

## 2022-03-31 MED ORDER — MIDAZOLAM HCL 5 MG/5ML IJ SOLN
INTRAMUSCULAR | Status: DC | PRN
  Administered 2022-03-31: 2 mg via INTRAVENOUS

## 2022-03-31 MED ORDER — ONDANSETRON HCL 4 MG/2ML IJ SOLN
INTRAMUSCULAR | Status: AC
  Filled 2022-03-31: qty 2

## 2022-03-31 MED ORDER — DEXTROSE 5 % IV SOLN
INTRAVENOUS | Status: DC | PRN
  Administered 2022-03-31: 3 g via INTRAVENOUS

## 2022-03-31 MED ORDER — DEXAMETHASONE SODIUM PHOSPHATE 10 MG/ML IJ SOLN
INTRAMUSCULAR | Status: DC | PRN
  Administered 2022-03-31: 8 mg via INTRAVENOUS

## 2022-03-31 MED ORDER — LIDOCAINE HCL (PF) 2 % IJ SOLN
INTRAMUSCULAR | Status: DC | PRN
  Administered 2022-03-31: 1.5 mg/kg/h via INTRADERMAL

## 2022-03-31 MED ORDER — SUCCINYLCHOLINE CHLORIDE 200 MG/10ML IV SOSY
PREFILLED_SYRINGE | INTRAVENOUS | Status: DC | PRN
  Administered 2022-03-31: 200 mg via INTRAVENOUS

## 2022-03-31 MED ORDER — BUPIVACAINE LIPOSOME 1.3 % IJ SUSP: 20.0000 mL | Freq: Once | INTRAMUSCULAR | Status: DC

## 2022-03-31 MED ORDER — PHENYLEPHRINE HCL-NACL 20-0.9 MG/250ML-% IV SOLN
INTRAVENOUS | Status: DC | PRN
  Administered 2022-03-31: 100 ug/min via INTRAVENOUS

## 2022-03-31 MED ORDER — ACETAMINOPHEN 500 MG PO TABS
1000.0000 mg | ORAL_TABLET | ORAL | Status: AC
  Administered 2022-03-31: 1000 mg via ORAL
  Filled 2022-03-31: qty 2

## 2022-03-31 MED ORDER — ROCURONIUM BROMIDE 10 MG/ML (PF) SYRINGE
PREFILLED_SYRINGE | INTRAVENOUS | Status: AC
  Filled 2022-03-31: qty 10

## 2022-03-31 MED ORDER — ONDANSETRON HCL 4 MG/2ML IJ SOLN
4.0000 mg | Freq: Once | INTRAMUSCULAR | Status: DC | PRN
Start: 2022-03-31 — End: 2022-03-31

## 2022-03-31 MED ORDER — BUPIVACAINE-EPINEPHRINE (PF) 0.25% -1:200000 IJ SOLN
INTRAMUSCULAR | Status: AC
  Filled 2022-03-31: qty 30

## 2022-03-31 MED ORDER — DEXAMETHASONE SODIUM PHOSPHATE 10 MG/ML IJ SOLN
INTRAMUSCULAR | Status: AC
  Filled 2022-03-31: qty 1

## 2022-03-31 MED ORDER — CHLORHEXIDINE GLUCONATE 0.12 % MT SOLN
15.0000 mL | Freq: Once | OROMUCOSAL | Status: AC
  Administered 2022-03-31: 15 mL via OROMUCOSAL

## 2022-03-31 MED ORDER — KETAMINE HCL 10 MG/ML IJ SOLN
INTRAMUSCULAR | Status: DC | PRN
  Administered 2022-03-31: 10 mg via INTRAVENOUS
  Administered 2022-03-31 (×2): 20 mg via INTRAVENOUS

## 2022-03-31 MED ORDER — BUPIVACAINE-EPINEPHRINE 0.25% -1:200000 IJ SOLN
INTRAMUSCULAR | Status: DC | PRN
  Administered 2022-03-31: 30 mL

## 2022-03-31 MED ORDER — CHLORHEXIDINE GLUCONATE CLOTH 2 % EX PADS: 6.0000 | MEDICATED_PAD | Freq: Once | CUTANEOUS | Status: DC

## 2022-03-31 MED ORDER — SUGAMMADEX SODIUM 200 MG/2ML IV SOLN
INTRAVENOUS | Status: DC | PRN
  Administered 2022-03-31: 300 mg via INTRAVENOUS

## 2022-03-31 MED ORDER — MIDAZOLAM HCL 2 MG/2ML IJ SOLN
INTRAMUSCULAR | Status: AC
  Filled 2022-03-31: qty 2

## 2022-03-31 MED ORDER — FENTANYL CITRATE PF 50 MCG/ML IJ SOSY: 25.0000 ug | PREFILLED_SYRINGE | INTRAMUSCULAR | Status: DC | PRN

## 2022-03-31 MED ORDER — KETOROLAC TROMETHAMINE 30 MG/ML IJ SOLN
30.0000 mg | Freq: Once | INTRAMUSCULAR | Status: DC | PRN
Start: 2022-03-31 — End: 2022-03-31

## 2022-03-31 MED ORDER — KETAMINE HCL 50 MG/5ML IJ SOSY
PREFILLED_SYRINGE | INTRAMUSCULAR | Status: AC
Start: 2022-03-31 — End: ?
  Filled 2022-03-31: qty 5

## 2022-03-31 MED ORDER — OXYCODONE HCL 5 MG PO TABS
ORAL_TABLET | ORAL | Status: AC
  Filled 2022-03-31: qty 1

## 2022-03-31 MED ORDER — CEFAZOLIN SODIUM 1 G IJ SOLR
INTRAMUSCULAR | Status: AC
  Filled 2022-03-31: qty 10

## 2022-03-31 MED ORDER — ONDANSETRON HCL 4 MG/2ML IJ SOLN
INTRAMUSCULAR | Status: DC | PRN
  Administered 2022-03-31: 4 mg via INTRAVENOUS

## 2022-03-31 MED ORDER — PROPOFOL 10 MG/ML IV BOLUS
INTRAVENOUS | Status: DC | PRN
  Administered 2022-03-31: 200 mg via INTRAVENOUS
  Administered 2022-03-31: 30 mg via INTRAVENOUS
  Administered 2022-03-31: 20 mg via INTRAVENOUS

## 2022-03-31 MED ORDER — LIDOCAINE 2% (20 MG/ML) 5 ML SYRINGE
INTRAMUSCULAR | Status: DC | PRN
  Administered 2022-03-31: 100 mg via INTRAVENOUS

## 2022-03-31 MED ORDER — FENTANYL CITRATE (PF) 100 MCG/2ML IJ SOLN
INTRAMUSCULAR | Status: DC | PRN
  Administered 2022-03-31: 100 ug via INTRAVENOUS

## 2022-03-31 MED ORDER — 0.9 % SODIUM CHLORIDE (POUR BTL) OPTIME
TOPICAL | Status: DC | PRN
  Administered 2022-03-31: 1000 mL

## 2022-03-31 MED ORDER — PROPOFOL 10 MG/ML IV BOLUS
INTRAVENOUS | Status: AC
  Filled 2022-03-31: qty 20

## 2022-03-31 MED ORDER — OXYCODONE HCL 5 MG PO TABS
5.0000 mg | ORAL_TABLET | Freq: Once | ORAL | Status: AC | PRN
  Administered 2022-03-31: 5 mg via ORAL

## 2022-03-31 MED ORDER — BUPIVACAINE LIPOSOME 1.3 % IJ SUSP
INTRAMUSCULAR | Status: DC | PRN
  Administered 2022-03-31: 20 mL

## 2022-03-31 MED ORDER — OXYCODONE HCL 5 MG/5ML PO SOLN: 5.0000 mg | Freq: Once | ORAL | Status: AC | PRN

## 2022-03-31 MED ORDER — LACTATED RINGERS IV SOLN: INTRAVENOUS | Status: DC

## 2022-03-31 MED ORDER — CELECOXIB 200 MG PO CAPS
400.0000 mg | ORAL_CAPSULE | ORAL | Status: AC
  Administered 2022-03-31: 400 mg via ORAL
  Filled 2022-03-31: qty 2

## 2022-03-31 MED ORDER — BUPIVACAINE LIPOSOME 1.3 % IJ SUSP
INTRAMUSCULAR | Status: AC
  Filled 2022-03-31: qty 20

## 2022-03-31 MED ORDER — ENSURE PRE-SURGERY PO LIQD
296.0000 mL | Freq: Once | ORAL | Status: DC
  Filled 2022-03-31: qty 296

## 2022-03-31 MED ORDER — CEFAZOLIN IN SODIUM CHLORIDE 3-0.9 GM/100ML-% IV SOLN
3.0000 g | INTRAVENOUS | Status: DC
  Filled 2022-03-31: qty 100

## 2022-03-31 MED ORDER — FENTANYL CITRATE (PF) 100 MCG/2ML IJ SOLN
INTRAMUSCULAR | Status: AC
  Filled 2022-03-31: qty 2

## 2022-03-31 MED ORDER — OXYCODONE HCL 5 MG PO TABS: 5.0000 mg | ORAL_TABLET | Freq: Four times a day (QID) | ORAL | 0 refills | Status: DC | PRN

## 2022-03-31 MED ORDER — ORAL CARE MOUTH RINSE: 15.0000 mL | Freq: Once | OROMUCOSAL | Status: AC

## 2022-03-31 SURGICAL SUPPLY — 41 items
ADH SKN CLS APL DERMABOND .7 (GAUZE/BANDAGES/DRESSINGS) ×1
APL PRP STRL LF DISP 70% ISPRP (MISCELLANEOUS) ×2
APL SWBSTK 6 STRL LF DISP (MISCELLANEOUS)
APPLICATOR COTTON TIP 6 STRL (MISCELLANEOUS) IMPLANT
APPLICATOR COTTON TIP 6IN STRL (MISCELLANEOUS)
BAG COUNTER SPONGE SURGICOUNT (BAG) IMPLANT
BAG SPNG CNTER NS LX DISP (BAG)
CHLORAPREP W/TINT 26 (MISCELLANEOUS) ×3 IMPLANT
COVER SURGICAL LIGHT HANDLE (MISCELLANEOUS) ×2 IMPLANT
COVER TIP SHEARS 8 DVNC (MISCELLANEOUS) ×1 IMPLANT
COVER TIP SHEARS 8MM DA VINCI (MISCELLANEOUS) ×2
DERMABOND ADVANCED (GAUZE/BANDAGES/DRESSINGS) ×1
DERMABOND ADVANCED .7 DNX12 (GAUZE/BANDAGES/DRESSINGS) IMPLANT
DRAPE ARM DVNC X/XI (DISPOSABLE) ×4 IMPLANT
DRAPE COLUMN DVNC XI (DISPOSABLE) ×1 IMPLANT
DRAPE DA VINCI XI ARM (DISPOSABLE) ×8
DRAPE DA VINCI XI COLUMN (DISPOSABLE) ×2
ELECT REM PT RETURN 15FT ADLT (MISCELLANEOUS) ×2 IMPLANT
GLOVE BIOGEL PI IND STRL 7.0 (GLOVE) ×2 IMPLANT
GLOVE BIOGEL PI INDICATOR 7.0 (GLOVE) ×2
GLOVE SURG SS PI 7.0 STRL IVOR (GLOVE) ×4 IMPLANT
GOWN STRL REUS W/ TWL LRG LVL3 (GOWN DISPOSABLE) ×2 IMPLANT
GOWN STRL REUS W/ TWL XL LVL3 (GOWN DISPOSABLE) IMPLANT
GOWN STRL REUS W/TWL LRG LVL3 (GOWN DISPOSABLE) ×4
GOWN STRL REUS W/TWL XL LVL3 (GOWN DISPOSABLE)
IRRIG SUCT STRYKERFLOW 2 WTIP (MISCELLANEOUS)
IRRIGATION SUCT STRKRFLW 2 WTP (MISCELLANEOUS) IMPLANT
KIT BASIN OR (CUSTOM PROCEDURE TRAY) ×2 IMPLANT
KIT TURNOVER KIT A (KITS) ×2 IMPLANT
MARKER SKIN DUAL TIP RULER LAB (MISCELLANEOUS) ×2 IMPLANT
MESH 3DMAX MID 4X6 RT LRG (Mesh General) ×1 IMPLANT
SEAL CANN UNIV 5-8 DVNC XI (MISCELLANEOUS) ×3 IMPLANT
SEAL XI 5MM-8MM UNIVERSAL (MISCELLANEOUS) ×6
SPIKE FLUID TRANSFER (MISCELLANEOUS) ×1 IMPLANT
SUT MNCRL AB 4-0 PS2 18 (SUTURE) ×2 IMPLANT
SUT STRATAFIX SPIRAL PDS3-0 (SUTURE) ×2 IMPLANT
SUT VIC AB 2-0 SH 27 (SUTURE) ×2
SUT VIC AB 2-0 SH 27X BRD (SUTURE) ×1 IMPLANT
TOWEL OR 17X26 10 PK STRL BLUE (TOWEL DISPOSABLE) ×2 IMPLANT
TRAY LAPAROSCOPIC (CUSTOM PROCEDURE TRAY) ×2 IMPLANT
TROCAR Z-THREAD OPTICAL 5X100M (TROCAR) ×2 IMPLANT

## 2022-03-31 NOTE — Anesthesia Preprocedure Evaluation (Signed)
Anesthesia Evaluation  Patient identified by MRN, date of birth, ID band Patient awake    Reviewed: Allergy & Precautions, NPO status , Patient's Chart, lab work & pertinent test results  Airway Mallampati: II  TM Distance: <3 FB Neck ROM: Full    Dental no notable dental hx.    Pulmonary sleep apnea and Continuous Positive Airway Pressure Ventilation ,    Pulmonary exam normal breath sounds clear to auscultation       Cardiovascular hypertension, Pt. on medications Normal cardiovascular exam Rhythm:Regular Rate:Normal     Neuro/Psych negative neurological ROS  negative psych ROS   GI/Hepatic Neg liver ROS, GERD  ,  Endo/Other  Morbid obesity  Renal/GU negative Renal ROS  negative genitourinary   Musculoskeletal negative musculoskeletal ROS (+)   Abdominal   Peds negative pediatric ROS (+)  Hematology negative hematology ROS (+)   Anesthesia Other Findings   Reproductive/Obstetrics negative OB ROS                             Anesthesia Physical Anesthesia Plan  ASA: 3  Anesthesia Plan: General   Post-op Pain Management: Minimal or no pain anticipated   Induction: Intravenous  PONV Risk Score and Plan: 2 and Ondansetron, Dexamethasone and Treatment may vary due to age or medical condition  Airway Management Planned: Oral ETT  Additional Equipment:   Intra-op Plan:   Post-operative Plan: Extubation in OR  Informed Consent: I have reviewed the patients History and Physical, chart, labs and discussed the procedure including the risks, benefits and alternatives for the proposed anesthesia with the patient or authorized representative who has indicated his/her understanding and acceptance.     Dental advisory given  Plan Discussed with: CRNA and Surgeon  Anesthesia Plan Comments:         Anesthesia Quick Evaluation

## 2022-03-31 NOTE — Op Note (Signed)
Preop diagnosis: right inguinal hernia  Postop diagnosis: right inguinal hernia  Procedure: Robotic  Right inguinal hernia repair with mesh  Surgeon: Gurney Maxin, M.D.  Anesthesia: Gen.   Indications for procedure: Joshua Parrish is a 46 y.o. male with symptoms of pain and enlarging Right inguinal hernia(s). After discussing risks, alternatives and benefits he decided on robotic repair and was brought to day surgery for repair.  Description of procedure: The patient was brought into the operative suite, placed supine. Anesthesia was administered with endotracheal tube. Patient was strapped in place. The patient was prepped and draped in the usual sterile fashion.  A small transverse incision was made just right of midline about 20 cm cephalad of the pubic symphysis. A 54m trocar was used to gain access to the peritoneal cavity by optical entry technique. Pneumoperitoneum was applied with a high flow and low pressure. The laparoscope was reinserted to confirm position.  Next Exparel:Marcaine mix was used for bilateral TAP blocks. 1 8 mm trocar was placed in the right mid abdomen. 1 8 mm trocar was placed in the left mid abdomen. The 5 mm trocar was upsized to an 8 mm trocar. The patient was placed in trendelenberg position. The robot was docked.  On initial visualization , there was a moderate right inguinal hernia. A peritoneal flap was created on the right. This was continued medially to the pubic bone medially and laterally to muscles. Hernia sac was completely dissected out of the canal. Vas deference and contents of the cord were safely dissected away of the hernia sac. There was a large lipoma of the cord that was removed.  A large mid weight right 3D max mesh was inserted and sutured with 2-0 vicryl medially to the lacunar ligament. The mesh was positioned flat and directly up against the direct and indirect areas. The peritoneal flap was sewn back up with running 3-0 strattafix suture.  The  CO2 was evacuated while watching to ensure the mesh did not migrate.. The anterior rectus fascia was closed with 0 vicryl in interrupted sutures and all skin incisions were closed with 4-0 monocryl subcu stitch. The patient awoke from anesthesia and was brought to PACU in stable condition.  Findings: right inguinal hernia  Specimen: none  Blood loss: 10 ml  Local anesthesia: 50 ml Exparel:Marcaine mix  Complications: none  Implant: right large mid weight Bard 3D max mesh  LGurney Maxin M.D. General, Bariatric, & Minimally Invasive Surgery [facility], PU[place]5:03 PM 03/31/2022

## 2022-03-31 NOTE — H&P (Signed)
Chief Complaint: Hernia   History of Present Illness: Joshua Parrish is a 46 y.o. male who is seen today as an office consultation at the request of Dr. Valere Dross for evaluation of Hernia .   He first noticed the hernia 1 year ago but finally had it worked up last month. Symptoms are pain in the area. He has nausea that he associated with epigastric pain.  He does smoke He does not have diabetes He has no history of hernias  Review of Systems: A complete review of systems was obtained from the patient. I have reviewed this information and discussed as appropriate with the patient. See HPI as well for other ROS.  Review of Systems  Constitutional: Negative.  HENT: Negative.  Eyes: Negative.  Respiratory: Negative.  Cardiovascular: Negative.  Gastrointestinal: Negative.  Genitourinary: Negative.  Musculoskeletal: Negative.  Skin: Negative.  Neurological: Negative.  Endo/Heme/Allergies: Negative.  Psychiatric/Behavioral: Negative.   Medical History: Past Medical History:  Diagnosis Date  Anxiety  GERD (gastroesophageal reflux disease)  Sleep apnea   There is no problem list on file for this patient.  Past Surgical History:  Procedure Laterality Date  APPENDECTOMY  CHOLECYSTECTOMY  knee surgery  neck surgery  shoulder surgery   Allergies  Allergen Reactions  Coconut Oil Diarrhea, Nausea And Vomiting and Other (See Comments)  Allergic to coconuts  Lisinopril Cough   Current Outpatient Medications on File Prior to Visit  Medication Sig Dispense Refill  allopurinoL (ZYLOPRIM) 300 MG tablet Take 1 tablet by mouth once daily  bisacodyL (DULCOLAX) 10 mg suppository Place rectally  buPROPion (WELLBUTRIN XL) 150 MG XL tablet Take 1 tablet by mouth once daily  dicyclomine (BENTYL) 10 mg capsule Take by mouth  docusate (COLACE) 100 MG capsule Take 100 mg by mouth 2 (two) times daily  escitalopram oxalate (LEXAPRO) 20 MG tablet Take 1 tablet by mouth once daily  gabapentin  (NEURONTIN) 300 MG capsule Take 600 mg by mouth 3 (three) times daily  ipratropium (ATROVENT) 21 mcg (0.03 %) nasal spray Place into one nostril  lamoTRIgine (LAMICTAL) 100 MG tablet Take 100 mg by mouth 2 (two) times daily  methocarbamoL (ROBAXIN) 500 MG tablet Take 500 mg by mouth 3 (three) times daily  omeprazole (PRILOSEC) 40 MG DR capsule Take by mouth  oxyCODONE (ROXICODONE) 5 MG immediate release tablet Take by mouth  polyethylene glycol (MIRALAX) powder Take by mouth  triamcinolone 0.1 % cream Apply 1 Application topically 2 (two) times daily  anastrozole (ARIMIDEX) 1 mg tablet Take by mouth  loratadine (CLARITIN) 10 mg tablet Take by mouth  tadalafiL (CIALIS) 20 MG tablet Take by mouth  XYOSTED 75 mg/0.5 mL subcutaneous auto-injector Inject subcutaneously   No current facility-administered medications on file prior to visit.   History reviewed. No pertinent family history.  Social History   Tobacco Use  Smoking Status Some Days  Types: Cigarettes  Smokeless Tobacco Not on file   Social History   Socioeconomic History  Marital status: Divorced  Tobacco Use  Smoking status: Some Days  Types: Cigarettes  Vaping Use  Vaping Use: Every day  Substance and Sexual Activity  Alcohol use: Yes  Drug use: Never   Objective:   Vitals:  02/26/22 1039  BP: (!) 138/90  Pulse: (!) 112  Temp: 36.8 C (98.2 F)  SpO2: 98%  Weight: (!) 145.5 kg (320 lb 12.8 oz)  Height: 175.3 cm ('5\' 9"'$ )   Body mass index is 47.37 kg/m.  Physical Exam Constitutional:  Appearance: Normal appearance.  HENT:  Head: Normocephalic and atraumatic.  Pulmonary:  Effort: Pulmonary effort is normal.  Abdominal:  Comments: Small right inguinal hernia, diastasis of epigastric area  Musculoskeletal:  General: Normal range of motion.  Cervical back: Normal range of motion.  Neurological:  General: No focal deficit present.  Mental Status: He is alert and oriented to person, place, and time.  Mental status is at baseline.  Psychiatric:  Mood and Affect: Mood normal.  Behavior: Behavior normal.  Thought Content: Thought content normal.    Labs, Imaging and Diagnostic Testing: I reviewed CT scan showing a moderate right inguinal hernia. I did not see a hiatal hernia or epigastric hernia  Assessment and Plan:  Diagnoses and all orders for this visit:  Diastasis recti  Non-recurrent unilateral inguinal hernia without obstruction or gangrene  We discussed etiology of hernias and how they can cause pain. We discussed options for inguinal hernia repair vs observation. We discussed details of the surgery of general anesthesia, surgical approach and incisions, dissecting the sack away from vas deference, testicular vessels and nerves and placement of mesh. We discussed risks of bleeding, infection, recurrence, injury to vas deference, testicular vessels, nerve injury, and chronic pain. He showed good understanding and wanted proceed with minimally invasive right inguinal hernia repair as outpatient.

## 2022-03-31 NOTE — Anesthesia Procedure Notes (Signed)
Procedure Name: Intubation Date/Time: 03/31/2022 3:44 PM  Performed by: Milford Cage, CRNAPre-anesthesia Checklist: Patient identified, Emergency Drugs available, Suction available and Patient being monitored Patient Re-evaluated:Patient Re-evaluated prior to induction Oxygen Delivery Method: Circle system utilized Preoxygenation: Pre-oxygenation with 100% oxygen Induction Type: IV induction Ventilation: Mask ventilation without difficulty Laryngoscope Size: Miller and 2 Grade View: Grade II Tube type: Oral Tube size: 7.0 mm Number of attempts: 1 Airway Equipment and Method: Stylet Placement Confirmation: ETT inserted through vocal cords under direct vision, positive ETCO2 and breath sounds checked- equal and bilateral Secured at: 22 cm Tube secured with: Tape Dental Injury: Teeth and Oropharynx as per pre-operative assessment

## 2022-03-31 NOTE — Discharge Instructions (Signed)
[facility]  UMBILICAL OR INGUINAL HERNIA REPAIR: POST OP INSTRUCTIONS  Always review your discharge instruction sheet given to you by the facility where your surgery was performed. IF YOU HAVE DISABILITY OR FAMILY LEAVE FORMS, YOU MUST BRING THEM TO THE OFFICE FOR PROCESSING.   DO NOT GIVE THEM TO YOUR DOCTOR.  1. A  prescription for pain medication may be given to you upon discharge.  Take your pain medication as prescribed, if needed.  If narcotic pain medicine is not needed, then you may take acetaminophen (Tylenol) or ibuprofen (Advil) as needed. 2. Take your usually prescribed medications unless otherwise directed. If you need a refill on your pain medication, please contact your pharmacy.  They will contact our office to request authorization. Prescriptions will not be filled after 5 pm or on week-ends. 3. You should follow a light diet the first 24 hours after arrival home, such as soup and crackers, etc.  Be sure to include lots of fluids daily.  Resume your normal diet the day after surgery. 4.Most patients will experience some swelling and bruising around the umbilicus or in the groin and scrotum.  Ice packs and reclining will help.  Swelling and bruising can take several days to resolve.  6. It is common to experience some constipation if taking pain medication after surgery.  Increasing fluid intake and taking a stool softener (such as Colace) will usually help or prevent this problem from occurring.  A mild laxative (Milk of Magnesia or Miralax) should be taken according to package directions if there are no bowel movements after 48 hours. 7. Unless discharge instructions indicate otherwise, you may remove your bandages 24-48 hours after surgery, and you may shower at that time.  You may have steri-strips (small skin tapes) in place directly over the incision.  These strips should be left on the skin for 7-10 days.  If your surgeon used skin glue on the  incision, you may shower in 24 hours.  The glue will flake off over the next 2-3 weeks.  Any sutures or staples will be removed at the office during your follow-up visit. 8. ACTIVITIES:  You may resume regular (light) daily activities beginning the next day--such as daily self-care, walking, climbing stairs--gradually increasing activities as tolerated.  You may have sexual intercourse when it is comfortable.  Refrain from any heavy lifting or straining until approved by your doctor.  a.You may drive when you are no longer taking prescription pain medication, you can comfortably wear a seatbelt, and you can safely maneuver your car and apply brakes. b.RETURN TO WORK:   _____________________________________________  9.You should see your doctor in the office for a follow-up appointment approximately 2-3 weeks after your surgery.  Make sure that you call for this appointment within a day or two after you arrive home to insure a convenient appointment time. 10.OTHER INSTRUCTIONS: _________________________    _____________________________________  WHEN TO CALL YOUR DOCTOR: Fever over 101.0 Inability to urinate Nausea and/or vomiting Extreme swelling or bruising Continued bleeding from incision. Increased pain, redness, or drainage from the incision  The clinic staff is available to answer your questions during regular business hours.  Please don't hesitate to call and ask to speak to one of the nurses for clinical concerns.  If you have a medical emergency, go to the nearest emergency room or call 911.  A surgeon from [facility] is always on call at the hospital   [address], [address],   [place], [place]  [address] ?  [address] [address], [place], [place]   [address] [phone] ? [phone] ? FAX [phone] Web site: [email or web address]  

## 2022-03-31 NOTE — Transfer of Care (Signed)
Immediate Anesthesia Transfer of Care Note  Patient: Joshua Parrish  Procedure(s) Performed: XI ROBOTIC ASSISTED RIGHT INGUINAL HERNIA WITH MESH (Right)  Patient Location: PACU  Anesthesia Type:General  Level of Consciousness: drowsy  Airway & Oxygen Therapy: Patient Spontanous Breathing and Patient connected to face mask oxygen  Post-op Assessment: Report given to RN and Post -op Vital signs reviewed and stable  Post vital signs: Reviewed and stable  Last Vitals:  Vitals Value Taken Time  BP 129/69 03/31/22 1718  Temp    Pulse 94 03/31/22 1720  Resp 19 03/31/22 1720  SpO2 94 % 03/31/22 1720  Vitals shown include unvalidated device data.  Last Pain:  Vitals:   03/31/22 1310  TempSrc:   PainSc: 0-No pain         Complications: No notable events documented.

## 2022-04-01 NOTE — Anesthesia Postprocedure Evaluation (Signed)
Anesthesia Post Note  Patient: Joshua Parrish  Procedure(s) Performed: XI ROBOTIC ASSISTED RIGHT INGUINAL HERNIA WITH MESH (Right)     Patient location during evaluation: PACU Anesthesia Type: General Level of consciousness: awake and alert Pain management: pain level controlled Vital Signs Assessment: post-procedure vital signs reviewed and stable Respiratory status: spontaneous breathing, nonlabored ventilation, respiratory function stable and patient connected to nasal cannula oxygen Cardiovascular status: blood pressure returned to baseline and stable Postop Assessment: no apparent nausea or vomiting Anesthetic complications: no   No notable events documented.  Last Vitals:  Vitals:   03/31/22 1745 03/31/22 1757  BP: 119/65 (!) 145/86  Pulse: 93 96  Resp: 17 13  Temp:    SpO2: 92% 100%    Last Pain:  Vitals:   03/31/22 1757  TempSrc:   PainSc: 2                  Elianny Buxbaum S

## 2022-04-02 ENCOUNTER — Other Ambulatory Visit: Payer: Self-pay | Admitting: Family

## 2022-04-06 ENCOUNTER — Telehealth: Payer: 59 | Admitting: Nurse Practitioner

## 2022-04-06 DIAGNOSIS — L245 Irritant contact dermatitis due to other chemical products: Secondary | ICD-10-CM | POA: Diagnosis not present

## 2022-04-06 MED ORDER — TRIAMCINOLONE ACETONIDE 0.1 % EX CREA: 1.0000 | TOPICAL_CREAM | Freq: Two times a day (BID) | CUTANEOUS | 0 refills | Status: DC

## 2022-04-06 NOTE — Progress Notes (Signed)
I have spent 5 minutes in review of e-visit questionnaire, review and updating patient chart, medical decision making and response to patient.  ° °Zadyn Yardley W Breiana Stratmann, NP ° °  °

## 2022-04-06 NOTE — Progress Notes (Signed)
E Visit for Rash  We are sorry that you are not feeling well. Here is how we plan to help!   Based upon what you have shared with me it looks like you have mild irritation of the skin post operatively. We will treat with a topical steroid ointment for now. If skin worsens then you would need to follow up with the surgeon or your PCP regarding this. For now it does not appear to be any serious infection that requires pill antibiotics. If the area starts to drain fluid that has an odor or change of color you would need to notify the surgeon or your PCP as soon as possible.     HOME CARE:  Take cool showers and avoid direct sunlight. Apply cool compress or wet dressings. Take a bath in an oatmeal bath.  Sprinkle content of one Aveeno packet under running faucet with comfortably warm water.  Bathe for 15-20 minutes, 1-2 times daily.  Pat dry with a towel. Do not rub the rash. Use hydrocortisone cream. Take an antihistamine like Benadryl for widespread rashes that itch.  The adult dose of Benadryl is 25-50 mg by mouth 4 times daily. Caution:  This type of medication may cause sleepiness.  Do not drink alcohol, drive, or operate dangerous machinery while taking antihistamines.  Do not take these medications if you have prostate enlargement.  Read package instructions thoroughly on all medications that you take.  GET HELP RIGHT AWAY IF:  Symptoms don't go away after treatment. Severe itching that persists. If you rash spreads or swells. If you rash begins to smell. If it blisters and opens or develops a yellow-brown crust. You develop a fever. You have a sore throat. You become short of breath.  MAKE SURE YOU:  Understand these instructions. Will watch your condition. Will get help right away if you are not doing well or get worse.  Thank you for choosing an e-visit.  Your e-visit answers were reviewed by a board certified advanced clinical practitioner to complete your personal care  plan. Depending upon the condition, your plan could have included both over the counter or prescription medications.  Please review your pharmacy choice. Make sure the pharmacy is open so you can pick up prescription now. If there is a problem, you may contact your provider through [employer] and have the prescription routed to another pharmacy.  Your safety is important to [place]. If you have drug allergies check your prescription carefully.   For the next 24 hours you can use MyChart to ask questions about today's visit, request a non-urgent call back, or ask for a work or school excuse. You will get an email in the next two days asking about your experience. I hope that your e-visit has been valuable and will speed your recovery.

## 2022-04-08 ENCOUNTER — Emergency Department (HOSPITAL_BASED_OUTPATIENT_CLINIC_OR_DEPARTMENT_OTHER)
Admission: EM | Admit: 2022-04-08 | Discharge: 2022-04-08 | Disposition: A | Discharge status: Home / Self Care | Payer: 59 | Attending: Emergency Medicine | Admitting: Emergency Medicine

## 2022-04-08 ENCOUNTER — Emergency Department (HOSPITAL_BASED_OUTPATIENT_CLINIC_OR_DEPARTMENT_OTHER): Payer: 59

## 2022-04-08 ENCOUNTER — Encounter (HOSPITAL_BASED_OUTPATIENT_CLINIC_OR_DEPARTMENT_OTHER): Payer: Self-pay

## 2022-04-08 ENCOUNTER — Other Ambulatory Visit: Payer: Self-pay

## 2022-04-08 DIAGNOSIS — I10 Essential (primary) hypertension: Secondary | ICD-10-CM | POA: Diagnosis not present

## 2022-04-08 DIAGNOSIS — R110 Nausea: Secondary | ICD-10-CM | POA: Diagnosis not present

## 2022-04-08 DIAGNOSIS — R1084 Generalized abdominal pain: Secondary | ICD-10-CM | POA: Insufficient documentation

## 2022-04-08 DIAGNOSIS — G8918 Other acute postprocedural pain: Secondary | ICD-10-CM | POA: Diagnosis not present

## 2022-04-08 LAB — CBC
HCT: 38.5 % — ABNORMAL LOW (ref 39.0–52.0)
Hemoglobin: 12.8 g/dL — ABNORMAL LOW (ref 13.0–17.0)
MCH: 29.6 pg (ref 26.0–34.0)
MCHC: 33.2 g/dL (ref 30.0–36.0)
MCV: 88.9 fL (ref 80.0–100.0)
Platelets: 297 10*3/uL (ref 150–400)
RBC: 4.33 MIL/uL (ref 4.22–5.81)
RDW: 13.5 % (ref 11.5–15.5)
WBC: 6.7 10*3/uL (ref 4.0–10.5)
nRBC: 0 % (ref 0.0–0.2)

## 2022-04-08 LAB — COMPREHENSIVE METABOLIC PANEL
ALT: 24 U/L (ref 0–44)
AST: 15 U/L (ref 15–41)
Albumin: 4.4 g/dL (ref 3.5–5.0)
Alkaline Phosphatase: 61 U/L (ref 38–126)
Anion gap: 7 (ref 5–15)
BUN: 16 mg/dL (ref 6–20)
CO2: 26 mmol/L (ref 22–32)
Calcium: 9.2 mg/dL (ref 8.9–10.3)
Chloride: 105 mmol/L (ref 98–111)
Creatinine, Ser: 1.03 mg/dL (ref 0.61–1.24)
GFR, Estimated: >60 mL/min (ref >60)
Glucose, Bld: 97 mg/dL (ref 70–99)
Potassium: 4.3 mmol/L (ref 3.5–5.1)
Sodium: 138 mmol/L (ref 135–145)
Total Bilirubin: 0.3 mg/dL (ref 0.3–1.2)
Total Protein: 6.9 g/dL (ref 6.5–8.1)

## 2022-04-08 LAB — LACTIC ACID, PLASMA: Lactic Acid, Venous: 0.7 mmol/L (ref 0.5–1.9)

## 2022-04-08 LAB — LIPASE, BLOOD: Lipase: 29 U/L (ref 11–51)

## 2022-04-08 MED ORDER — IOHEXOL 300 MG/ML  SOLN
100.0000 mL | Freq: Once | INTRAMUSCULAR | Status: AC | PRN
  Administered 2022-04-08: 100 mL via INTRAVENOUS

## 2022-04-08 MED ORDER — OXYCODONE HCL 5 MG PO TABS: 5.0000 mg | ORAL_TABLET | ORAL | 0 refills | Status: DC | PRN

## 2022-04-08 MED ORDER — ONDANSETRON HCL 4 MG/2ML IJ SOLN
4.0000 mg | Freq: Once | INTRAMUSCULAR | Status: AC
  Administered 2022-04-08: 4 mg via INTRAVENOUS
  Filled 2022-04-08: qty 2

## 2022-04-08 MED ORDER — HYDROMORPHONE HCL 1 MG/ML IJ SOLN
1.0000 mg | Freq: Once | INTRAMUSCULAR | Status: AC
  Administered 2022-04-08: 1 mg via INTRAVENOUS
  Filled 2022-04-08: qty 1

## 2022-04-08 MED ORDER — SODIUM CHLORIDE 0.9 % IV BOLUS
1000.0000 mL | Freq: Once | INTRAVENOUS | Status: AC
  Administered 2022-04-08: 1000 mL via INTRAVENOUS

## 2022-04-08 NOTE — Discharge Instructions (Signed)
You were evaluated in the Emergency Department and after careful evaluation, we did not find any emergent condition requiring admission or further testing in the hospital.  Your exam/testing today is overall reassuring.  Recommend using MiraLAX if you use the oxycodone for pain, recommend follow-up with your surgeon.  Please return to the Emergency Department if you experience any worsening of your condition.   Thank you for allowing [place] to be a part of your care.

## 2022-04-08 NOTE — ED Triage Notes (Signed)
Had rt. inguinal sx 03/31/22 Pt states the rt. Groin thru to testicle has become worse tonight No dysuria +nausea

## 2022-04-08 NOTE — ED Provider Notes (Signed)
[facility] Emergency Department Provider Note MRN:  [id number]  Arrival date & time: 04/08/22     Chief Complaint   Groin Pain   History of Present Illness   Joshua Parrish is a 46 y.o. year-old male with a history of hypertension presenting to the ED with chief complaint of groin pain.  Over the past 3 to 4 days patient developing pain diffusely in the abdomen as well as the scrotum.  He is postop day 8 from a hernia repair surgery.  Had a bit of an elevated temperature, possible subjective fever earlier today.  Persistent nausea.  No chest pain, no shortness of breath.  Feels like his incision sites are leaking a bit.  Review of Systems  A thorough review of systems was obtained and all systems are negative except as noted in the HPI and PMH.   Patient's Health History    Past Medical History:  Diagnosis Date   Anxiety    Chicken pox    Depression    Fatty liver    GERD (gastroesophageal reflux disease)    Hypertension    Migraines    Osteoarthritis of shoulder    left   Rotator cuff impingement syndrome of left shoulder 06/19/2015   Sinusitis    Sleep apnea     Past Surgical History:  Procedure Laterality Date   APPENDECTOMY     CERVICAL LAMINECTOMY     has plates and fusion   CHOLECYSTECTOMY N/A 12/24/2015   Procedure: LAPAROSCOPIC CHOLECYSTECTOMY ;  Surgeon: [name] Pickerel, MD;  Location: [facility];  Service: General;  Laterality: N/A;   CLAVICLE EXCISION Left    HYPOSPADIAS CORRECTION     as child   KNEE SURGERY Bilateral    multiple   SHOULDER ARTHROSCOPY WITH ROTATOR CUFF REPAIR Left 06/26/2015   Procedure: SHOULDER ARTHROSCOPY WITH ROTATOR CUFF REPAIR;  Surgeon: Elsie Saas, MD;  Location: [facility];  Service: Orthopedics;  Laterality: Left;    Family History  Problem Relation Age of Onset   Arthritis Mother    Ovarian cancer Mother    Alcohol abuse Father    Breast cancer Maternal Uncle    Breast cancer Maternal  Grandmother    Cancer Maternal Grandmother        spinal surgery   Cancer Other    Hypertension Other    Diabetes Other    Breast cancer Maternal Great-grandfather    Colon cancer Neg Hx    Sleep apnea Neg Hx     Social History   Socioeconomic History   Marital status: Divorced    Spouse name: Not on file   Number of children: 1   Years of education: 12   Highest education level: Not on file  Occupational History   Occupation: Garage Interior and spatial designer   Tobacco Use   Smoking status: Never   Smokeless tobacco: Current    Types: Chew   Tobacco comments:    form given 11/29/15  Vaping Use   Vaping Use: Never used  Substance and Sexual Activity   Alcohol use: Yes    Alcohol/week: 0.0 standard drinks of alcohol    Comment: occasional   Drug use: No   Sexual activity: Yes  Other Topics Concern   Not on file  Social History Narrative   Fun: Lift weights, hike   Social Determinants of Health   Financial Resource Strain: Not on file  Food Insecurity: Not on file  Transportation Needs: Not on file  Physical Activity: Not on file  Stress: Not on file  Social Connections: Not on file  Intimate Partner Violence: Not on file     Physical Exam   Vitals:   04/08/22 0300 04/08/22 0436  BP: 123/60 (!) 110/57  Pulse: 98 88  Resp: 18 18  Temp:    SpO2: 94% 95%    CONSTITUTIONAL: Well-appearing, NAD NEURO/PSYCH:  Alert and oriented x 3, no focal deficits EYES:  eyes equal and reactive ENT/NECK:  no LAD, no JVD CARDIO: Regular rate, well-perfused, normal S1 and S2 PULM:  CTAB no wheezing or rhonchi GI/GU: Moderate diffuse tenderness, rebound MSK/SPINE:  No gross deformities, no edema SKIN:  no rash, atraumatic   *Additional and/or pertinent findings included in MDM below  Diagnostic and Interventional Summary    EKG Interpretation  Date/Time:    Ventricular Rate:    PR Interval:    QRS Duration:   QT Interval:    QTC Calculation:   R Axis:     Text  Interpretation:         Labs Reviewed  CBC - Abnormal; Notable for the following components:      Result Value   Hemoglobin 12.8 (*)    HCT 38.5 (*)    All other components within normal limits  COMPREHENSIVE METABOLIC PANEL  LIPASE, BLOOD  LACTIC ACID, PLASMA    CT ABDOMEN PELVIS W CONTRAST  Final Result    US SCROTUM W/DOPPLER  Final Result      Medications  sodium chloride 0.9 % bolus 1,000 mL (1,000 mLs Intravenous New Bag/Given 04/08/22 0255)  HYDROmorphone (DILAUDID) injection 1 mg (1 mg Intravenous Given 04/08/22 0255)  ondansetron (ZOFRAN) injection 4 mg (4 mg Intravenous Given 04/08/22 0254)  iohexol (OMNIPAQUE) 300 MG/ML solution 100 mL (100 mLs Intravenous Contrast Given 04/08/22 0349)     Procedures  /  Critical Care Procedures  ED Course and Medical Decision Making  Initial Impression and Ddx There is concern for possible peritonitis given the diffuse tenderness with rebound, postop day 8.  Vital signs reassuring.  Other considerations include postoperative ileus, postoperative bleeding, awaiting labs, CT  Past medical/surgical history that increases complexity of ED encounter: Recent hernia surgery  Interpretation of Diagnostics I personally reviewed the laboratory assessment and my interpretation is as follows: No significant blood count or electrolyte disturbance, no leukocytosis, no elevated lactate.  CT imaging is also reassuring, no acute abnormalities  Patient Reassessment and Ultimate Disposition/Management     Patient feeling a lot better, has continued benign abdominal exam.  Upon further inspection all 3 of his incision sites have some mild erythema surrounding them.  Doubt cellulitis, favoring local irritation from the medical adhesive.  Advised over-the-counter Benadryl or hydrocortisone.  Patient management required discussion with the following services or consulting groups:  General/Trauma Surgery  Complexity of Problems Addressed Acute illness  or injury that poses threat of life of bodily function  Additional Data Reviewed and Analyzed Further history obtained from: Further history from spouse/family member  Additional Factors Impacting ED Encounter Risk Use of parenteral controlled substances and Consideration of hospitalization  [name], [place] [email or web address]  Final Clinical Impressions(s) / ED Diagnoses     ICD-10-CM   1. Generalized abdominal pain  R10.84     2. Post-operative pain  G89.18       ED Discharge Orders          Ordered    oxyCODONE (ROXICODONE) 5  MG immediate release tablet  Every 4 hours PRN        04/08/22 0440             Discharge Instructions Discussed with and Provided to Patient:     Discharge Instructions      You were evaluated in the Emergency Department and after careful evaluation, we did not find any emergent condition requiring admission or further testing in the hospital.  Your exam/testing today is overall reassuring.  Recommend using MiraLAX if you use the oxycodone for pain, recommend follow-up with your surgeon.  Please return to the Emergency Department if you experience any worsening of your condition.   Thank you for allowing [place] to be a part of your care.       Maudie Flakes, MD 04/08/22 [phone]

## 2022-04-11 ENCOUNTER — Other Ambulatory Visit: Payer: Self-pay | Admitting: Family

## 2022-04-14 ENCOUNTER — Encounter: Payer: Self-pay | Admitting: Family Medicine

## 2022-04-14 ENCOUNTER — Ambulatory Visit (INDEPENDENT_AMBULATORY_CARE_PROVIDER_SITE_OTHER): Payer: 59 | Admitting: Family Medicine

## 2022-04-14 VITALS — BP 147/84 | HR 101 | Ht 69.0 in | Wt 317.5 lb

## 2022-04-14 DIAGNOSIS — Z9989 Dependence on other enabling machines and devices: Secondary | ICD-10-CM

## 2022-04-14 DIAGNOSIS — G253 Myoclonus: Secondary | ICD-10-CM

## 2022-04-14 DIAGNOSIS — G4733 Obstructive sleep apnea (adult) (pediatric): Secondary | ICD-10-CM | POA: Diagnosis not present

## 2022-04-14 NOTE — Progress Notes (Signed)
Chief Complaint  Patient presents with   Obstructive Sleep Apnea    Rm 1, alone. Here for CPAP f/u. Pt reports continues to have issues adjusting to CPAP. Pt toss and turns making it difficult to use.     HISTORY OF PRESENT ILLNESS:  04/14/22 ALL:  Joshua Parrish is a 46 y.o. male here today for follow up for OSA on CPAP, neck pain and involuntary jerking of extremities. He was last seen in 12/2021 by Dr Krista Blue who started lamotrigine '100mg'$  BID. EEG was normal. He reports muscle jerking has significantly improved. He is tolerating medication well with no obvious adverse effects. He is followed regularly by PCP.   He was last seen by Dr Rexene Alberts 10/2021 and having difficulty tolerating pressures. Max pressure decreased from 15 to 12cmH20. He reports doing better, today. He is tolerating settings. Upon review, max pressure remains at 15. He reports that a friend of his made some adjustments to his machine and he seems to be doing better. He is using CPAP most every night for at least 4 hours. He does endorse dry mouth at times. He has woken up some nights and removed his mask during sleep.     HISTORY (copied from Dr Rhea Belton previous note)  Joshua Parrish is a 46 year old male, with seen in request by primary doctor Gregor Hams, for evaluation of leg jerking movement, initial evaluation was on Jan 09, 2022,  I reviewed and summarized the referring note. PMHX. HTN Depression, anxiety Gout GERD Fatty Liver Obesity. History of cervical decompression  Patient had long history of obesity, obstructive sleep apnea, seeing Dr. Rexene Alberts, last visit was in February 23  He had a history of cervical decompression surgery, reported sudden onset neck pain, radiating pain to left shoulder, left-sided weakness prior to surgical decompression, surgery did help him,  He also had left shoulder arthroscopic surgery  Since 2022, after carrying a big heavy box upstairs, he began to noticed intermittent left arm  shaking, and if hold up his left leg, he also have left lower extremity shaking, improved by putting down the left arm and left leg,  Similar involvement on the right arm and right lower extremity as well, but much less frequent, he works on Copywriter, 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, often require prolonged walking, and heavy lifting, he complains of a year history of worsening low back pain after prolonged standing and walking," my lower back is killing me,",  He also complains of worsening neck pain, radiating pain to left shoulder, and to his left dominant hand,  He reported a history of abnormal brain scan as an infant, improved later, denies history of seizure, but during left hand and left jerking episode, sometimes he felt transient confusion, sweaty, last about 10 minutes  I was able to witnessed multiple episode, during deep tendon reflex examination, he had both lower extremity high-frequency myoclonic jerking-like movement, but if he is extending his left leg, he would trigger similar abnormal left leg movement, also involving left arm, seems to have variable effort on examinations,  Personally reviewed MRI of cervical spine in April 2023, evidence of C6-7 ACDF, mild degenerative changes no significant canal stenosis, no evidence of cord signal abnormality MRI of thoracic spine, mild degenerative changes, no cord compression MRI of lumbar spine, multilevel degenerative changes no significance canal or foraminal narrowing, MRI of brain, unremarkable  Lab in April 23, normal CBC, TSH   REVIEW OF SYSTEMS: Out of a complete 14 system review of  symptoms, the patient complains only of the following symptoms, none and all other reviewed systems are negative.   ALLERGIES: Allergies  Allergen Reactions   Coconut Oil Diarrhea, Nausea And Vomiting and Other (See Comments)    Allergic to coconuts   Lisinopril Cough     HOME MEDICATIONS: Outpatient Medications Prior to Visit  Medication Sig Dispense  Refill   allopurinol (ZYLOPRIM) 300 MG tablet TAKE ONE TABLET BY MOUTH DAILY 30 tablet 1   amLODipine (NORVASC) 10 MG tablet Take 10 mg by mouth daily.     anastrozole (ARIMIDEX) 1 MG tablet Take 1 mg by mouth every Monday, Wednesday, and Friday.     bisacodyl (DULCOLAX) 10 MG suppository Place 1 suppository (10 mg total) rectally as needed for moderate constipation. 12 suppository 0   buPROPion (WELLBUTRIN XL) 150 MG 24 hr tablet TAKE ONE TABLET BY MOUTH DAILY 90 tablet 1   Cholecalciferol (VITAMIN D3 PO) Take 2 tablets by mouth daily.     dicyclomine (BENTYL) 10 MG capsule Take 1 capsule (10 mg total) by mouth 3 (three) times daily before meals. 60 capsule 0   docusate sodium (COLACE) 100 MG capsule Take 1 capsule (100 mg total) by mouth 2 (two) times daily. 60 capsule 2   escitalopram (LEXAPRO) 20 MG tablet Take 1 tablet (20 mg total) by mouth daily. 90 tablet 3   gabapentin (NEURONTIN) 300 MG capsule Take 2 capsules (600 mg total) by mouth 3 (three) times daily. 90 capsule 0   ibuprofen (ADVIL) 800 MG tablet Take 1 tablet (800 mg total) by mouth every 8 (eight) hours as needed. 30 tablet 0   ipratropium (ATROVENT) 0.03 % nasal spray Place 2 sprays into both nostrils every 12 (twelve) hours. 30 mL 0   lamoTRIgine (LAMICTAL) 100 MG tablet Take 1 tablet (100 mg total) by mouth 2 (two) times daily. 60 tablet 11   loratadine (CLARITIN) 10 MG tablet Take 10 mg by mouth daily as needed for allergies or rhinitis.     methocarbamol (ROBAXIN) 500 MG tablet Take 1 tablet (500 mg total) by mouth 3 (three) times daily. (Patient taking differently: Take 500 mg by mouth every 8 (eight) hours as needed for muscle spasms.) 30 tablet 0   omeprazole (PRILOSEC) 40 MG capsule Take 1 capsule (40 mg total) by mouth daily before breakfast. 30 capsule 0   oxyCODONE (ROXICODONE) 5 MG immediate release tablet Take 1 tablet (5 mg total) by mouth every 4 (four) hours as needed for severe pain. 8 tablet 0   polyethylene  glycol (MIRALAX) 17 g packet Take 17 g by mouth daily. 14 each 0   spironolactone (ALDACTONE) 25 MG tablet TAKE ONE TABLET BY MOUTH DAILY 30 tablet 6   tadalafil (CIALIS) 20 MG tablet Take 20 mg by mouth in the morning.     testosterone cypionate (DEPOTESTOSTERONE CYPIONATE) 200 MG/ML injection Inject 100 mg into the muscle every Thursday.     triamcinolone cream (KENALOG) 0.1 % Apply 1 Application topically 2 (two) times daily. 30 g 0   No facility-administered medications prior to visit.     PAST MEDICAL HISTORY: Past Medical History:  Diagnosis Date   Anxiety    Chicken pox    Depression    Fatty liver    GERD (gastroesophageal reflux disease)    H/O hernia repair 03/31/2022   Hypertension    Migraines    Osteoarthritis of shoulder    left   Rotator cuff impingement syndrome of left shoulder 06/19/2015  Sinusitis    Sleep apnea      PAST SURGICAL HISTORY: Past Surgical History:  Procedure Laterality Date   APPENDECTOMY     CERVICAL LAMINECTOMY     has plates and fusion   CHOLECYSTECTOMY N/A 12/24/2015   Procedure: LAPAROSCOPIC CHOLECYSTECTOMY ;  Surgeon: [name] Pickerel, MD;  Location: [facility];  Service: General;  Laterality: N/A;   CLAVICLE EXCISION Left    HYPOSPADIAS CORRECTION     as child   KNEE SURGERY Bilateral    multiple   SHOULDER ARTHROSCOPY WITH ROTATOR CUFF REPAIR Left 06/26/2015   Procedure: SHOULDER ARTHROSCOPY WITH ROTATOR CUFF REPAIR;  Surgeon: Elsie Saas, MD;  Location: [facility];  Service: Orthopedics;  Laterality: Left;     FAMILY HISTORY: Family History  Problem Relation Age of Onset   Arthritis Mother    Ovarian cancer Mother    Alcohol abuse Father    Breast cancer Maternal Uncle    Breast cancer Maternal Grandmother    Cancer Maternal Grandmother        spinal surgery   Cancer Other    Hypertension Other    Diabetes Other    Breast cancer Maternal Great-grandfather    Colon cancer Neg Hx    Sleep apnea Neg Hx       SOCIAL HISTORY: Social History   Socioeconomic History   Marital status: Divorced    Spouse name: Not on file   Number of children: 1   Years of education: 12   Highest education level: Not on file  Occupational History   Occupation: Garage Interior and spatial designer   Tobacco Use   Smoking status: Never   Smokeless tobacco: Current    Types: Chew   Tobacco comments:    form given 11/29/15  Vaping Use   Vaping Use: Never used  Substance and Sexual Activity   Alcohol use: Yes    Alcohol/week: 0.0 standard drinks of alcohol    Comment: occasional   Drug use: No   Sexual activity: Yes  Other Topics Concern   Not on file  Social History Narrative   Fun: Lift weights, hike   Social Determinants of Health   Financial Resource Strain: Not on file  Food Insecurity: Not on file  Transportation Needs: Not on file  Physical Activity: Not on file  Stress: Not on file  Social Connections: Not on file  Intimate Partner Violence: Not on file     PHYSICAL EXAM  Vitals:   04/14/22 1412  BP: (!) 147/84  Pulse: (!) 101  Weight: (!) 317 lb 8 oz (144 kg)  Height: '5\' 9"'$  (1.753 m)   Body mass index is 46.89 kg/m.  Generalized: Well developed, in no acute distress  Cardiology: normal rate and rhythm, no murmur auscultated  Respiratory: clear to auscultation bilaterally    Neurological examination  Mentation: Alert oriented to time, place, history taking. Follows all commands speech and language fluent Cranial nerve II-XII: Pupils were equal round reactive to light. Extraocular movements were full, visual field were full on confrontational test. Facial sensation and strength were normal. Head turning and shoulder shrug  were normal and symmetric. Motor: The motor testing reveals 5 over 5 strength of all 4 extremities. Good symmetric motor tone is noted throughout.  Gait and station: Gait is normal.     DIAGNOSTIC DATA (LABS, IMAGING, TESTING) - I reviewed patient records, labs,  notes, testing and imaging myself where available.  Lab Results  Component Value Date   WBC  6.7 04/08/2022   HGB 12.8 (L) 04/08/2022   HCT 38.5 (L) 04/08/2022   MCV 88.9 04/08/2022   PLT 297 04/08/2022      Component Value Date/Time   NA 138 04/08/2022 0246   K 4.3 04/08/2022 0246   CL 105 04/08/2022 0246   CO2 26 04/08/2022 0246   GLUCOSE 97 04/08/2022 0246   BUN 16 04/08/2022 0246   CREATININE 1.03 04/08/2022 0246   CREATININE 0.85 09/20/2021 1544   CALCIUM 9.2 04/08/2022 0246   PROT 6.9 04/08/2022 0246   PROT 6.7 01/09/2022 1020   ALBUMIN 4.4 04/08/2022 0246   AST 15 04/08/2022 0246   ALT 24 04/08/2022 0246   ALKPHOS 61 04/08/2022 0246   BILITOT 0.3 04/08/2022 0246   GFRNONAA >60 04/08/2022 0246   GFRAA >60 02/06/2019 2359   Lab Results  Component Value Date   CHOL 229 (H) 04/19/2021   HDL 34.30 (L) 04/19/2021   LDLDIRECT 120.0 04/19/2021   TRIG 359.0 (H) 04/19/2021   CHOLHDL 7 04/19/2021   Lab Results  Component Value Date   HGBA1C 5.4 01/09/2022   Lab Results  Component Value Date   VITAMINB12 529 01/09/2022   Lab Results  Component Value Date   TSH 1.48 12/25/2021        No data to display               No data to display           ASSESSMENT AND PLAN  46 y.o. year old male  has a past medical history of Anxiety, Chicken pox, Depression, Fatty liver, GERD (gastroesophageal reflux disease), H/O hernia repair (03/31/2022), Hypertension, Migraines, Osteoarthritis of shoulder, Rotator cuff impingement syndrome of left shoulder (06/19/2015), Sinusitis, and Sleep apnea. 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 He will continue '100mg'$  BID. Compliance report shows excellent compliance. He is tolerating mas pressure of 15cmH20. He was encouraged to continue using CPAp nightly for at least 4 hours. May consider adjustment of humidity and Biotene OTC as needed. Healthy lifestyle habits  encouraged. He will follow up with PCP as directed. He will return to see me in 1 year, sooner if needed. He verbalizes understanding and agreement with this plan.   No orders of the defined types were placed in this encounter.    No orders of the defined types were placed in this encounter.    Debbora Presto, MSN, FNP-C 04/14/2022, 3:02 PM  [facility] [address] [place], [place] [address] [phone]

## 2022-04-14 NOTE — Patient Instructions (Signed)
Below is our plan:  We will continue lamotrigine '100mg'$  twice daily. Consider Biotene products over the counter for dry mouth   Please continue using your CPAP regularly. While your insurance requires that you use CPAP at least 4 hours each night on 70% of the nights, I recommend, that you not skip any nights and use it throughout the night if you can. Getting used to CPAP and staying with the treatment long term does take time and patience and discipline. Untreated obstructive sleep apnea when it is moderate to severe can have an adverse impact on cardiovascular health and raise her risk for heart disease, arrhythmias, hypertension, congestive heart failure, stroke and diabetes. Untreated obstructive sleep apnea causes sleep disruption, nonrestorative sleep, and sleep deprivation. This can have an impact on your day to day functioning and cause daytime sleepiness and impairment of cognitive function, memory loss, mood disturbance, and problems focussing. Using CPAP regularly can improve these symptoms.  Please make sure you are staying well hydrated. I recommend 50-60 ounces daily. Well balanced diet and regular exercise encouraged. Consistent sleep schedule with 6-8 hours recommended.   Please continue follow up with care team as directed.   Follow up with me in 1 year   You may receive a survey regarding today's visit. I encourage you to leave honest feed back as I do use this information to improve patient care. Thank you for seeing me today!

## 2022-05-04 ENCOUNTER — Other Ambulatory Visit: Payer: Self-pay | Admitting: Family

## 2022-06-02 ENCOUNTER — Ambulatory Visit: Payer: 59 | Admitting: Neurology

## 2022-06-12 NOTE — Progress Notes (Signed)
I, Peterson Lombard, LAT, ATC acting as a scribe for Lynne Leader, MD.  Joshua Parrish is a 46 y.o. male who presents to [place] at [facility] today for left knee and left ankle pain.  Patient was last seen by Dr. Georgina Snell on 12/25/2021 for lumbar radiculitis and has had prior ESI's on 5/8 and 02/13/2022.  Today, patient complains of left knee and left foot pain ongoing for about 1 month. Pt notes hx of prior L knee surgeries. Pt recalls [place] creek and slipped on the rocks, possibly "tweaking" his L knee. Pt locates pain to the anterior-medial aspect of the L knee. Pt locates his L foot pain to the head of the 1st MT on the plantar aspect.  He does have a history of gout.  He has had several life changes since his uric acid was last checked May 2022.  He is trying to lose weight and has had switch to high-protein diet and has had abdominal surgery.  He continues to take allopurinol 300 mg once daily.  L Knee swelling: yes Mechanical symptoms: yes Radiates: no Aggravates: normal activity Treatments tried: ice, HEP,   Pertinent review of systems: No fevers or chills  Relevant historical information: Gout.  Obesity.   Exam:  BP (!) 140/88   Ht '5\' 9"'$  (1.753 m)   Wt (!) 310 lb (140.6 kg)   BMI 45.78 kg/m  General: Well Developed, well nourished, and in no acute distress.   MSK: Left knee mild effusion otherwise normal-appearing Normal motion with crepitation.  Tender palpation medial joint line. Intact strength.  Left foot: Slight swelling first MTP.  Mildly tender palpation especially plantar aspect increased range of motion first MTP.   Lab and Radiology Results  Procedure: Real-time Ultrasound Guided Injection of left knee superior lateral patellar space Device: Philips Affiniti 50G Images permanently stored and available for review in PACS Verbal informed consent obtained.  Discussed risks and benefits of procedure. Warned about infection, bleeding,  hyperglycemia damage to structures among others. Patient expresses understanding and agreement Time-out conducted.   Noted no overlying erythema, induration, or other signs of local infection.   Skin prepped in a sterile fashion.   Local anesthesia: Topical Ethyl chloride.   With sterile technique and under real time ultrasound guidance: 40 mg of Kenalog and 2 mL of Marcaine injected into knee joint. Fluid seen entering the joint capsule.   Completed without difficulty   Pain immediately resolved suggesting accurate placement of the medication.   Advised to call if fevers/chills, erythema, induration, drainage, or persistent bleeding.   Images permanently stored and available for review in the ultrasound unit.  Impression: Technically successful ultrasound guided injection.    Diagnostic Limited MSK Ultrasound of: Left great toe MTP Mild effusion is present. Impression: Joint effusion first MTP   X-ray images left knee obtained today personally and independently interpreted Minimal DJD.  No acute fractures. Await formal radiology review  Lab Results  Component Value Date   LABURIC 6.3 01/15/2021      Assessment and Plan: 46 y.o. male with left knee pain.  Pain thought to be due to exacerbation of DJD or perhaps degenerative meniscus tear.  Gout exacerbation is also on the differential.  Plan for steroid injection.  We will go and check uric acid level as noted below.  Left great toe pain thought to be exacerbation of gout.  He has had several life changes that would increase his risk for gout.  We will check uric acid again and prescribe colchicine to use as needed.  Adjust allopurinol based on uric acid.  PDMP not reviewed this encounter. Orders Placed This Encounter  Procedures   [place] LIMITED JOINT SPACE STRUCTURES LOW LEFT(NO LINKED CHARGES)    Order Specific Question:   Reason for Exam (SYMPTOM  OR DIAGNOSIS REQUIRED)    Answer:   Left knee pain    Order Specific Question:    Preferred imaging location?    Answer:   [facility]   DG Knee AP/LAT W/[place] Left    Standing Status:   Future    Number of Occurrences:   1    Standing Expiration Date:   07/14/2022    Order Specific Question:   Reason for Exam (SYMPTOM  OR DIAGNOSIS REQUIRED)    Answer:   Left knee pain    Order Specific Question:   Preferred imaging location?    Answer:   [facility]   Uric acid    Standing Status:   Future    Number of Occurrences:   1    Standing Expiration Date:   06/14/2023   Meds ordered this encounter  Medications   colchicine 0.6 MG tablet    Sig: Take 1 tablet (0.6 mg total) by mouth daily.    Dispense:  60 tablet    Refill:  1     Discussed warning signs or symptoms. Please see discharge instructions. Patient expresses understanding.  The above documentation has been reviewed and is accurate and complete Lynne Leader, M.D.

## 2022-06-13 ENCOUNTER — Ambulatory Visit (INDEPENDENT_AMBULATORY_CARE_PROVIDER_SITE_OTHER): Payer: 59 | Admitting: Family Medicine

## 2022-06-13 ENCOUNTER — Ambulatory Visit (INDEPENDENT_AMBULATORY_CARE_PROVIDER_SITE_OTHER): Payer: 59

## 2022-06-13 ENCOUNTER — Ambulatory Visit: Payer: Self-pay

## 2022-06-13 VITALS — BP 140/88 | Ht 69.0 in | Wt 310.0 lb

## 2022-06-13 DIAGNOSIS — G8929 Other chronic pain: Secondary | ICD-10-CM

## 2022-06-13 DIAGNOSIS — M25562 Pain in left knee: Secondary | ICD-10-CM | POA: Diagnosis not present

## 2022-06-13 DIAGNOSIS — M1A9XX0 Chronic gout, unspecified, without tophus (tophi): Secondary | ICD-10-CM | POA: Insufficient documentation

## 2022-06-13 DIAGNOSIS — M79675 Pain in left toe(s): Secondary | ICD-10-CM | POA: Diagnosis not present

## 2022-06-13 DIAGNOSIS — M1A0720 Idiopathic chronic gout, left ankle and foot, without tophus (tophi): Secondary | ICD-10-CM

## 2022-06-13 LAB — URIC ACID: Uric Acid, Serum: 8.5 mg/dL — ABNORMAL HIGH (ref 4.0–7.8)

## 2022-06-13 MED ORDER — ALLOPURINOL 300 MG PO TABS: 600.0000 mg | ORAL_TABLET | Freq: Every day | ORAL | 3 refills | Status: DC

## 2022-06-13 MED ORDER — COLCHICINE 0.6 MG PO TABS: 0.6000 mg | ORAL_TABLET | Freq: Every day | ORAL | 1 refills | Status: DC

## 2022-06-13 NOTE — Addendum Note (Signed)
Addended by: Gregor Hams on: 06/13/2022 10:46 AM   Modules accepted: Orders

## 2022-06-13 NOTE — Progress Notes (Signed)
Uric acid is elevated at 8.5.  I have doubled the allopurinol to 600 mg daily.  This would be 2 pills of the allopurinol once a day.  New prescription sent to the [facility]. Please return for recheck in 6 weeks.

## 2022-06-13 NOTE — Patient Instructions (Addendum)
Thank you for coming in today.   You received an injection today. Seek immediate medical attention if the joint becomes red, extremely painful, or is oozing fluid.   Please get labs today before you leave   I've sent a prescription for colchicine to your pharmacy.   Check back as needed

## 2022-06-16 ENCOUNTER — Telehealth: Payer: Self-pay

## 2022-06-16 NOTE — Telephone Encounter (Signed)
Joshua Parrish (Key: [place]) [id number] OUZHQU 0.'25MG'$ /0.5ML auto-injectors Status: PA Response - DeniedCreated: October 13th, 2023 [id number] Sent: October 16th, 2023   FYI to provider.   Also I did not see this med on his current list. The last time I saw a message he was unable to get it due to the back order.

## 2022-06-16 NOTE — Telephone Encounter (Signed)
a [facility] (Key: [id number]) Rx #: [id number] ELTRVU 0.'25MG'$ /0.5ML auto-injectors   Form [facility] Electronic Prior Authorization Form [phone] NCPDP)  PA started

## 2022-06-16 NOTE — Progress Notes (Signed)
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  Pseudogout is a condition that can occasionally cause knee pain.  It is like gout.  The colchicine that I prescribed you should help.

## 2022-06-17 NOTE — Telephone Encounter (Signed)
No the PA just came through again

## 2022-06-20 ENCOUNTER — Encounter: Payer: Self-pay | Admitting: Family Medicine

## 2022-06-24 ENCOUNTER — Encounter: Payer: Self-pay | Admitting: Family Medicine

## 2022-06-25 NOTE — Progress Notes (Signed)
Joshua Parrish is a 46 y.o. male who presents to [place] at [facility] today for left knee pain follow up. Patient was last seen by Dr. Georgina Snell on 06/13/22 for this reason and reported locates pain to the anterior-medial aspect of the L knee, with swelling, mechanical symptoms, and worse pain with normal activity. Patient was given a steroid injection and had labs checked for gout. Today patient reports that the left knee is continually giving him pain especially in the knee cap. Patient is wondering if maybe a brace or something could help stabilize the knee. Pain is predominantly at the anterior aspect of the knee.  He is a Education officer, community.  He does a lot of kneeling on hard surfaces and notes that that is especially painful.   Pertinent review of systems: No fevers or chills  Relevant historical information: Hypertension Gout  Exam:  BP 130/82   Pulse 80   Ht '5\' 9"'$  (1.753 m)   Wt (!) 304 lb (137.9 kg)   SpO2 97%   BMI 44.89 kg/m  General: Well Developed, well nourished, and in no acute distress.   MSK: Left knee: Normal-appearing Tender palpation anterior knee. Palpable squeak present at anterior knee with knee motion. Intact strength pain with resisted knee extension. Normal knee strength to flexion without pain. Stable given his exam.   Lab and Radiology Results  Procedure: Real-time Ultrasound Guided Injection of left knee prepatellar bursa Device: Philips Affiniti 50G Images permanently stored and available for review in PACS Small prepatellar and infrapatellar bursa is present on ultrasound examination prior to injection. Verbal informed consent obtained.  Discussed risks and benefits of procedure. Warned about infection, bleeding, hyperglycemia damage to tendon and other structures among others. Patient expresses understanding and agreement Time-out conducted.   Noted no overlying erythema, induration, or other signs of  local infection.   Skin prepped in a sterile fashion.   Local anesthesia: Topical Ethyl chloride.   With sterile technique and under real time ultrasound guidance: 40 mg of Kenalog and 2 mL of Marcaine injected into prepatellar bursa space. Fluid seen entering the bursa.   Completed without difficulty   Pain immediately resolved suggesting accurate placement of the medication.   Advised to call if fevers/chills, erythema, induration, drainage, or persistent bleeding.   Images permanently stored and available for review in the ultrasound unit.  Impression: Technically successful ultrasound guided injection.      Assessment and Plan: 46 y.o. male with left anterior knee pain thought to be developing prepatellar bursitis.  Plan for anterior steroid injection and compression sleeve.   PDMP not reviewed this encounter. Orders Placed This Encounter  Procedures   [place] LIMITED JOINT SPACE STRUCTURES LOW LEFT(NO LINKED CHARGES)    Standing Status:   Future    Number of Occurrences:   1    Standing Expiration Date:   06/27/2023    Order Specific Question:   Reason for Exam (SYMPTOM  OR DIAGNOSIS REQUIRED)    Answer:   Left knee pain    Order Specific Question:   Preferred imaging location?    Answer:   [place]   Meds ordered this encounter  Medications   AMBULATORY NON FORMULARY MEDICATION    Sig: Compression sleeve left knee    Dispense:  1 each    Refill:  0     Discussed warning signs or symptoms. Please see discharge instructions. Patient expresses understanding.   The  above documentation has been reviewed and is accurate and complete Lynne Leader, M.D.

## 2022-06-26 ENCOUNTER — Ambulatory Visit (INDEPENDENT_AMBULATORY_CARE_PROVIDER_SITE_OTHER): Payer: 59 | Admitting: Family Medicine

## 2022-06-26 ENCOUNTER — Ambulatory Visit: Payer: Self-pay

## 2022-06-26 VITALS — BP 130/82 | HR 80 | Ht 69.0 in | Wt 304.0 lb

## 2022-06-26 DIAGNOSIS — M25562 Pain in left knee: Secondary | ICD-10-CM

## 2022-06-26 DIAGNOSIS — G8929 Other chronic pain: Secondary | ICD-10-CM | POA: Diagnosis not present

## 2022-06-26 DIAGNOSIS — M7042 Prepatellar bursitis, left knee: Secondary | ICD-10-CM | POA: Diagnosis not present

## 2022-06-26 MED ORDER — AMBULATORY NON FORMULARY MEDICATION: 0 refills | Status: DC

## 2022-06-26 NOTE — Patient Instructions (Addendum)
Thank you for coming in today.   Get a compression sleeve.   I recommend you obtained a compression sleeve to help with your joint problems. There are many options on the market however I recommend obtaining a full knee Body Helix compression sleeve.  You can find information (including how to appropriate measure yourself for sizing) can be found at www.Body http://www.lambert.com/.  Many of these products are health savings account (HSA) eligible.   You can use the compression sleeve at any time throughout the day but is most important to use while being active as well as for 2 hours post-activity.   It is appropriate to ice following activity with the compression sleeve in place.   Use padding with kneeling.   Recheck as needed.   Prepatellar Bursitis  Prepatellar bursitis is inflammation of the prepatellar bursa, which is a fluid-filled sac that acts as a cushion between the kneecap (patella) and the skin. Prepatellar bursitis happens when fluid builds up in this sac and causes it to swell. The condition causes knee pain. If left untreated, the bursa can get infected and become more serious. What are the causes? This condition may be caused by: Constant pressure on the knees from kneeling. A hit to the knee. Falling on the knee. Infection from bacteria. Moving the knee repeatedly in a forceful way. What increases the risk? You are more likely to develop this condition if: You play sports that have a high risk of falling on the knee or being hit on the knee. These include football, wrestling, basketball, or soccer. You do work that includes kneeling for long periods of time, such as roofing, flooring, plumbing, or gardening. You have another inflammatory condition, such as gout or rheumatoid arthritis. What are the signs or symptoms? The most common symptom of this condition is knee pain that gets better with rest. Other symptoms include: Swelling on the front of the kneecap. Warmth in the  knee. Tenderness with activity. Redness of the knee. Not being able to bend the knee or to kneel. How is this diagnosed? This condition is diagnosed based on: A physical exam. Your health care provider will compare your knees and check for tenderness and pain while moving your knee. Your medical history. Tests to check for infection. These may include blood tests and tests on the fluid in the bursa. Imaging tests, such as X-rays, an MRI, or ultrasound. These may be done to check for damage in the patella or to check for fluid buildup and swelling in the bursa. How is this treated? This condition may be treated by: Resting, icing, and raising (elevating) the knee. Taking medicines, such as: NSAIDs. These medicines can help to reduce pain and swelling. Antibiotics. These may be needed if you have an infection. Steroids. These are used to reduce swelling and inflammation. They may be prescribed if other treatments are not helping. Doing exercises to help you improve movement and strength (physical therapy). These may be recommended after pain and swelling improve. Having a procedure to remove fluid from the bursa. This may be done if other treatments are not helping. Having surgery to remove the bursa. This may be done if you have a severe infection or if the condition keeps coming back after treatment. Follow these instructions at home: Medicines Take over-the-counter and prescription medicines only as told by your health care provider. If you were prescribed antibiotics, take them as told by your health care provider. Do not stop using the antibiotic even if you  start to feel better. Managing pain, stiffness, and swelling  If told, put ice on the injured area. To do this: Put ice in a plastic bag. Place a towel between your skin and the bag. Leave the ice on for 20 minutes, 2-3 times a day. If your skin turns bright red, remove the ice right away to prevent skin damage. The risk of  damage is higher if you cannot feel pain, heat, or cold. Elevate the injured area above the level of your heart while you are sitting or lying down. Activity Do not use the injured limb to support your body weight until your health care provider says that you can. Use crutches or a walker as told by your health care provider. Rest your knee. Avoid activities that cause knee pain. Return to your normal activities as told by your health care provider. Ask your health care provider what activities are safe for you. Do exercises as told by your health care provider. General instructions Ask your health care provider when it is safe for you to drive or operate machinery. Do not use any products that contain nicotine or tobacco. These products include cigarettes, chewing tobacco, and vaping devices, such as e-cigarettes. These can delay healing. If you need help quitting, ask your health care provider. Keep all follow-up visits. Your health care provider will monitor your healing and adjust your activities. How is this prevented? Warm up and stretch before being active. Cool down and stretch after being active. Give your body time to rest between periods of activity. Maintain physical fitness, including strength and flexibility. Be safe and responsible while being active. This will help you to avoid falls. Wear kneepads if you have to kneel for a long period of time. Contact a health care provider if: Your symptoms do not improve or they get worse. Your symptoms keep coming back after treatment. You develop a fever and have warmth, redness, or swelling over your knee. This information is not intended to replace advice given to you by your health care provider. Make sure you discuss any questions you have with your health care provider. Document Revised: 03/03/2022 Document Reviewed: 03/03/2022 Elsevier Patient Education  [place].

## 2022-06-27 ENCOUNTER — Ambulatory Visit: Payer: 59 | Admitting: Family Medicine

## 2022-07-06 ENCOUNTER — Encounter: Payer: Self-pay | Admitting: Family Medicine

## 2022-07-06 DIAGNOSIS — M7042 Prepatellar bursitis, left knee: Secondary | ICD-10-CM

## 2022-07-06 DIAGNOSIS — G8929 Other chronic pain: Secondary | ICD-10-CM

## 2022-07-15 ENCOUNTER — Ambulatory Visit (INDEPENDENT_AMBULATORY_CARE_PROVIDER_SITE_OTHER): Payer: 59 | Admitting: Family Medicine

## 2022-07-15 ENCOUNTER — Ambulatory Visit (INDEPENDENT_AMBULATORY_CARE_PROVIDER_SITE_OTHER): Payer: 59

## 2022-07-15 VITALS — BP 112/78 | HR 88 | Ht 69.0 in | Wt 311.0 lb

## 2022-07-15 DIAGNOSIS — M1A0620 Idiopathic chronic gout, left knee, without tophus (tophi): Secondary | ICD-10-CM | POA: Diagnosis not present

## 2022-07-15 DIAGNOSIS — G8929 Other chronic pain: Secondary | ICD-10-CM

## 2022-07-15 DIAGNOSIS — M25562 Pain in left knee: Secondary | ICD-10-CM

## 2022-07-15 DIAGNOSIS — M7042 Prepatellar bursitis, left knee: Secondary | ICD-10-CM | POA: Diagnosis not present

## 2022-07-15 LAB — COMPREHENSIVE METABOLIC PANEL
ALT: 40 U/L (ref 0–53)
AST: 23 U/L (ref 0–37)
Albumin: 4.6 g/dL (ref 3.5–5.2)
Alkaline Phosphatase: 74 U/L (ref 39–117)
BUN: 13 mg/dL (ref 6–23)
CO2: 30 mEq/L (ref 19–32)
Calcium: 9.5 mg/dL (ref 8.4–10.5)
Chloride: 99 mEq/L (ref 96–112)
Creatinine, Ser: 1.06 mg/dL (ref 0.40–1.50)
GFR: 83.75 mL/min (ref >60.00)
Glucose, Bld: 98 mg/dL (ref 70–99)
Potassium: 4.4 mEq/L (ref 3.5–5.1)
Sodium: 136 mEq/L (ref 135–145)
Total Bilirubin: 0.5 mg/dL (ref 0.2–1.2)
Total Protein: 7.2 g/dL (ref 6.0–8.3)

## 2022-07-15 LAB — URIC ACID: Uric Acid, Serum: 5.3 mg/dL (ref 4.0–7.8)

## 2022-07-15 NOTE — Progress Notes (Signed)
   I, Peterson Lombard, LAT, ATC acting as a scribe for Lynne Leader, MD.  Joshua Parrish is a 46 y.o. male who presents to [place] [facility] today for f/u L knee pain. Pt was last seen by Dr. Georgina Snell on 06/26/22 and was given a L knee prepatellar bursa steroid injection and was advised to use a compression sleeve. Pt sent a MyChart message on 07/06/22 reporting cont'd L knee pain and a MRI was ordered. Today, pt reports he is here for labs to check his uric acid. Pt notes he is starting to have pain in his R knee due to compensation.   He has been taking allopurinol and colchicine over the last month.  He is not sure that it is made much of a difference for his knee.  Dx imaging: 07/15/22 L knee MRI- pending 06/13/22 L knee XR  Pertinent review of systems: No fevers or chills  Relevant historical information: Gout   Exam:  BP 112/78   Pulse 88   Ht '5\' 9"'$  (1.753 m)   Wt (!) 311 lb (141.1 kg)   SpO2 94%   BMI 45.93 kg/m  General: Well Developed, well nourished, and in no acute distress.   MSK: Left knee tender palpation anterior knee.    Lab and Radiology Results Lab Results  Component Value Date   LABURIC 8.5 (H) 06/13/2022        Assessment and Plan: 46 y.o. male with left knee pain.  Etiology remains unclear.  He has an MRI pending later today.  This should be helpful to evaluate source of pain.  As for the gout we will go and check uric acid and metabolic panel today.  Adjust allopurinol based on uric acid level.   PDMP not reviewed this encounter. Orders Placed This Encounter  Procedures   Uric acid    Standing Status:   Future    Number of Occurrences:   1    Standing Expiration Date:   07/16/2023   Comprehensive metabolic panel    Standing Status:   Future    Number of Occurrences:   1    Standing Expiration Date:   07/16/2023   No orders of the defined types were placed in this encounter.    Discussed warning signs or symptoms.  Please see discharge instructions. Patient expresses understanding.   [email or web address]

## 2022-07-15 NOTE — Progress Notes (Signed)
Labs look much better.  Continue current allopurinol dose.

## 2022-07-15 NOTE — Patient Instructions (Addendum)
Thank you for coming in today.   Please get labs today before you leave   Check back after the MRI

## 2022-07-18 ENCOUNTER — Encounter: Payer: Self-pay | Admitting: Family Medicine

## 2022-07-18 NOTE — Progress Notes (Signed)
The knee MRI shows a little bit of arthritis but not a lot of irritation or injury to the front of the knee where you could experience pain.  It is not obvious why you have so much pain in your knee.  I was considering a potential pinched nerve in your back but your lumbar spine MRI from earlier this year does not show severe pinched nerve at this level as well.  I think the main issue is pressure on the front of your knee or underneath your kneecap and offloading pressure with cushioning is probably the way to go.

## 2022-07-21 ENCOUNTER — Telehealth: Payer: 59 | Admitting: Family Medicine

## 2022-07-21 DIAGNOSIS — J019 Acute sinusitis, unspecified: Secondary | ICD-10-CM

## 2022-07-21 DIAGNOSIS — B9789 Other viral agents as the cause of diseases classified elsewhere: Secondary | ICD-10-CM

## 2022-07-21 MED ORDER — FLUTICASONE PROPIONATE 50 MCG/ACT NA SUSP: 2.0000 | Freq: Every day | NASAL | 0 refills | Status: DC

## 2022-07-21 NOTE — Progress Notes (Signed)
E-Visit for Sinus Problems  We are sorry that you are not feeling well.  Here is how we plan to help!  Based on what you have shared with me it looks like you have sinusitis.  Sinusitis is inflammation and infection in the sinus cavities of the head.  Based on your presentation I believe you most likely have Acute Viral Sinusitis.This is an infection most likely caused by a virus. There is not specific treatment for viral sinusitis other than to help you with the symptoms until the infection runs its course.  You may use an oral decongestant such as Mucinex D or if you have glaucoma or high blood pressure use plain Mucinex. Saline nasal spray help and can safely be used as often as needed for congestion, I have prescribed: Fluticasone nasal spray two sprays in each nostril once a day  Some authorities believe that zinc sprays or the use of Echinacea may shorten the course of your symptoms.  Sinus infections are not as easily transmitted as other respiratory infection, however we still recommend that you avoid close contact with loved ones, especially the very young and elderly.  Remember to wash your hands thoroughly throughout the day as this is the number one way to prevent the spread of infection!  Home Care: Only take medications as instructed by your medical team. Do not take these medications with alcohol. A steam or ultrasonic humidifier can help congestion.  You can place a towel over your head and breathe in the steam from hot water coming from a faucet. Avoid close contacts especially the very young and the elderly. Cover your mouth when you cough or sneeze. Always remember to wash your hands.  Get Help Right Away If: You develop worsening fever or sinus pain. You develop a severe head ache or visual changes. Your symptoms persist after you have completed your treatment plan.  Make sure you Understand these instructions. Will watch your condition. Will get help right away if you  are not doing well or get worse.   Thank you for choosing an e-visit.  Your e-visit answers were reviewed by a board certified advanced clinical practitioner to complete your personal care plan. Depending upon the condition, your plan could have included both over the counter or prescription medications.  Please review your pharmacy choice. Make sure the pharmacy is open so you can pick up prescription now. If there is a problem, you may contact your provider through [employer] and have the prescription routed to another pharmacy.  Your safety is important to [place]. If you have drug allergies check your prescription carefully.   For the next 24 hours you can use MyChart to ask questions about today's visit, request a non-urgent call back, or ask for a work or school excuse. You will get an email in the next two days asking about your experience. I hope that your e-visit has been valuable and will speed your recovery.  I provided 5 minutes of non face-to-face time during this encounter for chart review, medication and order placement, as well as and documentation.

## 2022-08-11 ENCOUNTER — Telehealth: Payer: 59 | Admitting: Nurse Practitioner

## 2022-08-11 DIAGNOSIS — R051 Acute cough: Secondary | ICD-10-CM

## 2022-08-11 DIAGNOSIS — J069 Acute upper respiratory infection, unspecified: Secondary | ICD-10-CM | POA: Diagnosis not present

## 2022-08-11 MED ORDER — BENZONATATE 100 MG PO CAPS: 100.0000 mg | ORAL_CAPSULE | Freq: Three times a day (TID) | ORAL | 0 refills | Status: DC | PRN

## 2022-08-11 MED ORDER — ALBUTEROL SULFATE HFA 108 (90 BASE) MCG/ACT IN AERS: 2.0000 | INHALATION_SPRAY | Freq: Four times a day (QID) | RESPIRATORY_TRACT | 0 refills | Status: DC | PRN

## 2022-08-11 NOTE — Progress Notes (Signed)
We are sorry that you are not feeling well.  Here is how we plan to help!  Based on your presentation I believe you most likely have A cough due to a virus.  This is called viral bronchitis and is best treated by rest, plenty of fluids and control of the cough.  You may use Ibuprofen or Tylenol as directed to help your symptoms.  Common viruses that cause these symptoms include: COVID, flu, RSV or rhinovirus. Providers prescribe antibiotics to treat infections caused by bacteria. Antibiotics are very powerful in treating bacterial infections when they are used properly. To maintain their effectiveness, they should be used only when necessary. Overuse of antibiotics has resulted in the development of superbugs that are resistant to treatment!    After careful review of your answers, I would not recommend an antibiotic for your condition.  Antibiotics are not effective against viruses and therefore should not be used to treat them. Common examples of infections caused by viruses include colds and flu    In addition you may use A prescription cough medication called Tessalon Perles '100mg'$ . You may take 1-2 capsules every 8 hours as needed for your cough.  We will also prescribe an inhaler that will help with the tightness and coughing.   Meds ordered this encounter  Medications   benzonatate (TESSALON) 100 MG capsule    Sig: Take 1 capsule (100 mg total) by mouth 3 (three) times daily as needed.    Dispense:  30 capsule    Refill:  0   albuterol (VENTOLIN HFA) 108 (90 Base) MCG/ACT inhaler    Sig: Inhale 2 puffs into the lungs every 6 (six) hours as needed for wheezing or shortness of breath.    Dispense:  8 g    Refill:  0     From your responses in the eVisit questionnaire you describe inflammation in the upper respiratory tract which is causing a significant cough.  This is commonly called Bronchitis and has four common causes:   Allergies Viral Infections   USE OF BRONCHODILATOR ("RESCUE")  INHALERS: There is a risk from using your bronchodilator too frequently.  The risk is that over-reliance on a medication which only relaxes the muscles surrounding the breathing tubes can reduce the effectiveness of medications prescribed to reduce swelling and congestion of the tubes themselves.  Although you feel brief relief from the bronchodilator inhaler, your asthma may actually be worsening with the tubes becoming more swollen and filled with mucus.  This can delay other crucial treatments, such as oral steroid medications. If you need to use a bronchodilator inhaler daily, several times per day, you should discuss this with your provider.  There are probably better treatments that could be used to keep your asthma under control.     HOME CARE Only take medications as instructed by your medical team. Complete the entire course of an antibiotic. Drink plenty of fluids and get plenty of rest. Avoid close contacts especially the very young and the elderly Cover your mouth if you cough or cough into your sleeve. Always remember to wash your hands A steam or ultrasonic humidifier can help congestion.   GET HELP RIGHT AWAY IF: You develop worsening fever. You become short of breath You cough up blood. Your symptoms persist after you have completed your treatment plan MAKE SURE YOU  Understand these instructions. Will watch your condition. Will get help right away if you are not doing well or get worse.    Thank  you for choosing an e-visit.  Your e-visit answers were reviewed by a board certified advanced clinical practitioner to complete your personal care plan. Depending upon the condition, your plan could have included both over the counter or prescription medications.  Please review your pharmacy choice. Make sure the pharmacy is open so you can pick up prescription now. If there is a problem, you may contact your provider through [employer] and have the prescription routed to  another pharmacy.  Your safety is important to [place]. If you have drug allergies check your prescription carefully.   For the next 24 hours you can use MyChart to ask questions about today's visit, request a non-urgent call back, or ask for a work or school excuse. You will get an email in the next two days asking about your experience. I hope that your e-visit has been valuable and will speed your recovery.   I spent approximately 5 minutes reviewing the patient's history, current symptoms and coordinating their plan of care today.

## 2022-08-12 ENCOUNTER — Encounter: Payer: Self-pay | Admitting: Family

## 2022-08-12 ENCOUNTER — Other Ambulatory Visit: Payer: Self-pay | Admitting: Family

## 2022-08-12 ENCOUNTER — Ambulatory Visit (INDEPENDENT_AMBULATORY_CARE_PROVIDER_SITE_OTHER): Payer: 59 | Admitting: Family

## 2022-08-12 ENCOUNTER — Other Ambulatory Visit (HOSPITAL_BASED_OUTPATIENT_CLINIC_OR_DEPARTMENT_OTHER): Payer: Self-pay

## 2022-08-12 VITALS — BP 122/84 | HR 95 | Temp 98.8°F | Resp 18 | Ht 69.0 in | Wt 309.2 lb

## 2022-08-12 DIAGNOSIS — R051 Acute cough: Secondary | ICD-10-CM

## 2022-08-12 DIAGNOSIS — J209 Acute bronchitis, unspecified: Secondary | ICD-10-CM

## 2022-08-12 MED ORDER — AZITHROMYCIN 250 MG PO TABS
ORAL_TABLET | ORAL | 0 refills | Status: DC
  Filled 2022-08-12: qty 6, 5d supply, fill #0

## 2022-08-12 MED ORDER — AZITHROMYCIN 250 MG PO TABS: ORAL_TABLET | ORAL | 0 refills | Status: DC

## 2022-08-12 MED ORDER — HYDROCODONE BIT-HOMATROP MBR 5-1.5 MG/5ML PO SOLN
5.0000 mL | Freq: Three times a day (TID) | ORAL | 0 refills | Status: DC | PRN
  Filled 2022-08-12: qty 30, 2d supply, fill #0
  Filled 2022-08-12 (×2): qty 90, 6d supply, fill #0
  Filled 2022-08-12: qty 30, 2d supply, fill #0

## 2022-08-12 MED ORDER — PREDNISONE 20 MG PO TABS
20.0000 mg | ORAL_TABLET | Freq: Every day | ORAL | 0 refills | Status: DC
  Filled 2022-08-12: qty 5, 5d supply, fill #0

## 2022-08-12 MED ORDER — ALBUTEROL SULFATE (2.5 MG/3ML) 0.083% IN NEBU
2.5000 mg | INHALATION_SOLUTION | Freq: Once | RESPIRATORY_TRACT | Status: AC
  Administered 2022-08-12: 2.5 mg via RESPIRATORY_TRACT

## 2022-08-12 MED ORDER — HYDROCODONE BIT-HOMATROP MBR 5-1.5 MG/5ML PO SOLN: 5.0000 mL | Freq: Three times a day (TID) | ORAL | 0 refills | Status: DC | PRN

## 2022-08-12 MED ORDER — PREDNISONE 20 MG PO TABS: 20.0000 mg | ORAL_TABLET | Freq: Every day | ORAL | 0 refills | Status: DC

## 2022-08-12 NOTE — Progress Notes (Signed)
Joshua Parrish is a 46 y.o. 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   Low back pain 01/09/2022   Rotator cuff disorder, left 12/25/2017   Bilateral shoulder pain 05/22/2017   Left knee pain 05/22/2017   Urinary frequency 02/09/2017   Essential hypertension 01/16/2017   Prostatitis 12/25/2016   Sepsis ([place]) 12/25/2016   GERD (gastroesophageal reflux disease) 12/25/2016   Sinusitis 12/09/2016   Tendinitis of right rotator cuff 12/09/2016   Laryngopharyngeal reflux (LPR) 02/04/2016   Mass in neck 11/27/2015   Rotator cuff impingement syndrome of left shoulder 06/19/2015   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 8 g 0   allopurinol (ZYLOPRIM) 300 MG tablet Take 2 tablets (600 mg total) by mouth daily. 180 tablet 3   AMBULATORY NON FORMULARY MEDICATION Compression sleeve left knee 1 each 0   amLODipine (NORVASC) 10 MG tablet TAKE 1 TABLET BY MOUTH DAILY 30 tablet 3   anastrozole (ARIMIDEX) 1 MG tablet Take 1 mg by mouth every Monday, Wednesday, and Friday.     benzonatate (TESSALON) 100 MG capsule Take 1 capsule (100 mg total) by mouth 3 (three) times daily as needed. 30 capsule 0   buPROPion (WELLBUTRIN XL) 150 MG 24 hr tablet TAKE ONE TABLET BY MOUTH DAILY 90 tablet 1   Cholecalciferol (VITAMIN D3 PO) Take 2 tablets by mouth daily.     colchicine 0.6 MG tablet Take 1 tablet (0.6 mg total) by mouth daily. 60 tablet 1   escitalopram (LEXAPRO) 20 MG tablet Take 1 tablet (20 mg total) by mouth daily. 90 tablet  3   fluticasone (FLONASE) 50 MCG/ACT nasal spray Place 2 sprays into both nostrils daily. 16 g 0   gabapentin (NEURONTIN) 300 MG capsule Take 2 capsules (600 mg total) by mouth 3 (three) times daily. 90 capsule 0   ibuprofen (ADVIL) 800 MG tablet Take 1 tablet (800 mg total) by mouth every 8 (eight) hours as needed. 30 tablet 0   ipratropium (ATROVENT) 0.03 % nasal spray Place 2 sprays into both nostrils every 12 (twelve) hours. 30 mL 0   lamoTRIgine (LAMICTAL) 100 MG tablet Take 1 tablet (100 mg total) by mouth 2 (two) times daily. 60 tablet 11   loratadine (CLARITIN) 10 MG tablet Take 10 mg by mouth daily as needed for allergies or rhinitis.     omeprazole (PRILOSEC) 40 MG capsule Take 1 capsule (40 mg total) by mouth daily before breakfast. 30 capsule 0   polyethylene glycol (MIRALAX) 17 g packet Take 17 g by mouth daily. 14 each 0   spironolactone (ALDACTONE) 25 MG tablet TAKE ONE TABLET BY MOUTH DAILY 30 tablet 6   tadalafil (CIALIS) 20 MG tablet Take 20 mg by mouth in the morning.     testosterone cypionate (DEPOTESTOSTERONE CYPIONATE) 200 MG/ML injection Inject 100 mg into the muscle every Thursday.     triamcinolone cream (KENALOG) 0.1 % Apply 1 Application topically 2 (two) times daily. 30 g 0   valsartan-hydrochlorothiazide (DIOVAN-HCT) 320-25 MG tablet  TAKE ONE TABLET BY MOUTH DAILY 30 tablet 3   azithromycin (ZITHROMAX Z-PAK) 250 MG tablet Take 2 tablets (500 mg) by mouth today, then 1 tablet (250 mg) by mouth daily x4 days. 6 tablet 0   HYDROcodone bit-homatropine (HYCODAN) 5-1.5 MG/5ML syrup Take 5 mLs by mouth every 8 (eight) hours as needed for cough. 120 mL 0   predniSONE (DELTASONE) 20 MG tablet Take 1 tablet (20 mg total) by mouth daily with breakfast. 5 tablet 0   No current facility-administered medications for this visit.    Allergies: Silicone, Coconut oil, and Lisinopril  Past Medical History:  Diagnosis Date   Anxiety    Chicken pox    Depression    Fatty liver     GERD (gastroesophageal reflux disease)    H/O hernia repair 03/31/2022   Hypertension    Migraines    Osteoarthritis of shoulder    left   Rotator cuff impingement syndrome of left shoulder 06/19/2015   Sinusitis    Sleep apnea     Past Surgical History:  Procedure Laterality Date   APPENDECTOMY     CERVICAL LAMINECTOMY     has plates and fusion   CHOLECYSTECTOMY N/A 12/24/2015   Procedure: LAPAROSCOPIC CHOLECYSTECTOMY ;  Surgeon: [name] Pickerel, MD;  Location: [facility];  Service: General;  Laterality: N/A;   CLAVICLE EXCISION Left    HYPOSPADIAS CORRECTION     as child   KNEE SURGERY Bilateral    multiple   SHOULDER ARTHROSCOPY WITH ROTATOR CUFF REPAIR Left 06/26/2015   Procedure: SHOULDER ARTHROSCOPY WITH ROTATOR CUFF REPAIR;  Surgeon: Elsie Saas, MD;  Location: [place];  Service: Orthopedics;  Laterality: Left;    Family History  Problem Relation Age of Onset   Arthritis Mother    Ovarian cancer Mother    Alcohol abuse Father    Breast cancer Maternal Uncle    Breast cancer Maternal Grandmother    Cancer Maternal Grandmother        spinal surgery   Cancer Other    Hypertension Other    Diabetes Other    Breast cancer Maternal Great-grandfather    Colon cancer Neg Hx    Sleep apnea Neg Hx     Social History   Tobacco Use   Smoking status: Never   Smokeless tobacco: Current    Types: Chew   Tobacco comments:    form given 11/29/15  Substance Use Topics   Alcohol use: Yes    Alcohol/week: 0.0 standard drinks of alcohol    Comment: occasional    Subjective:  2-3 day history of cough/ congestion; did e-visit yesterday and was given Tessalon perles/ albuterol; no fever; "barking cough"; notes that e-visit provider encouraged him to follow up for in person OV; feels like cough is moving down into his chest;     Objective:  Vitals:   08/12/22 0900  BP: 122/84  Pulse: 95  Resp: 18  Temp: 98.8 F (37.1 C)  TempSrc: Oral  SpO2: 96%  Weight:  (!) 309 lb 3.2 oz (140.3 kg)  Height: '5\' 9"'$  (1.753 m)    General: Well developed, well nourished, in no acute distress  Skin : Warm and dry.  Head: Normocephalic and atraumatic  Eyes: Sclera and conjunctiva clear; pupils round and reactive to light; extraocular movements intact  Ears: External normal; canals clear; tympanic membranes normal  Oropharynx: Pink, supple. No suspicious lesions  Neck: Supple without thyromegaly, adenopathy  Lungs: Respirations unlabored; wheezing noted in  upper lobes- improved after albuterol treatment in office; CVS exam: normal rate and regular rhythm.  Neurologic: Alert and oriented; speech intact; face symmetrical; moves all extremities well; CNII-XII intact without focal deficit   Assessment:  1. Acute bronchitis, unspecified organism   2. Acute cough     Plan:  Albuterol nebulizer treatment in office with relief; Rx for Z-pak, prednisone and Hycodan cough syrup; increase fluids, rest and follow up worse, no better.  Work note given for today and tomorrow to work from home;   No follow-ups on file.  No orders of the defined types were placed in this encounter.   Requested Prescriptions   Signed Prescriptions Disp Refills   azithromycin (ZITHROMAX Z-PAK) 250 MG tablet 6 tablet 0    Sig: Take 2 tablets (500 mg) by mouth today, then 1 tablet (250 mg) by mouth daily x4 days.   HYDROcodone bit-homatropine (HYCODAN) 5-1.5 MG/5ML syrup 120 mL 0    Sig: Take 5 mLs by mouth every 8 (eight) hours as needed for cough.   predniSONE (DELTASONE) 20 MG tablet 5 tablet 0    Sig: Take 1 tablet (20 mg total) by mouth daily with breakfast.

## 2022-08-12 NOTE — Patient Instructions (Signed)
Hold the Colchicine while you are on the Z-pak; the prednisone will help limit any gout issues as well.

## 2022-08-14 ENCOUNTER — Observation Stay (HOSPITAL_BASED_OUTPATIENT_CLINIC_OR_DEPARTMENT_OTHER)
Admission: EM | Admit: 2022-08-14 | Discharge: 2022-08-16 | Disposition: A | Discharge status: Home / Self Care | Payer: 59 | Attending: Internal Medicine | Admitting: Internal Medicine

## 2022-08-14 ENCOUNTER — Other Ambulatory Visit: Payer: Self-pay

## 2022-08-14 ENCOUNTER — Encounter (HOSPITAL_COMMUNITY): Payer: Self-pay

## 2022-08-14 ENCOUNTER — Ambulatory Visit (INDEPENDENT_AMBULATORY_CARE_PROVIDER_SITE_OTHER): Payer: Managed Care, Other (non HMO) | Admitting: Family

## 2022-08-14 ENCOUNTER — Encounter: Payer: Self-pay | Admitting: Family

## 2022-08-14 ENCOUNTER — Encounter (HOSPITAL_BASED_OUTPATIENT_CLINIC_OR_DEPARTMENT_OTHER): Payer: Self-pay | Admitting: Emergency Medicine

## 2022-08-14 ENCOUNTER — Emergency Department (HOSPITAL_BASED_OUTPATIENT_CLINIC_OR_DEPARTMENT_OTHER): Payer: 59

## 2022-08-14 VITALS — BP 126/62 | HR 91 | Temp 98.3°F | Resp 18 | Ht 69.0 in | Wt 313.4 lb

## 2022-08-14 DIAGNOSIS — R051 Acute cough: Secondary | ICD-10-CM

## 2022-08-14 DIAGNOSIS — Z96653 Presence of artificial knee joint, bilateral: Secondary | ICD-10-CM | POA: Diagnosis not present

## 2022-08-14 DIAGNOSIS — Z79899 Other long term (current) drug therapy: Secondary | ICD-10-CM | POA: Diagnosis not present

## 2022-08-14 DIAGNOSIS — R0609 Other forms of dyspnea: Secondary | ICD-10-CM

## 2022-08-14 DIAGNOSIS — R0602 Shortness of breath: Secondary | ICD-10-CM

## 2022-08-14 DIAGNOSIS — Z7951 Long term (current) use of inhaled steroids: Secondary | ICD-10-CM | POA: Insufficient documentation

## 2022-08-14 DIAGNOSIS — R0902 Hypoxemia: Secondary | ICD-10-CM

## 2022-08-14 DIAGNOSIS — K219 Gastro-esophageal reflux disease without esophagitis: Secondary | ICD-10-CM | POA: Diagnosis present

## 2022-08-14 DIAGNOSIS — Z1152 Encounter for screening for COVID-19: Secondary | ICD-10-CM | POA: Insufficient documentation

## 2022-08-14 DIAGNOSIS — I10 Essential (primary) hypertension: Secondary | ICD-10-CM | POA: Diagnosis not present

## 2022-08-14 DIAGNOSIS — J069 Acute upper respiratory infection, unspecified: Secondary | ICD-10-CM

## 2022-08-14 DIAGNOSIS — J205 Acute bronchitis due to respiratory syncytial virus: Principal | ICD-10-CM | POA: Insufficient documentation

## 2022-08-14 DIAGNOSIS — J9601 Acute respiratory failure with hypoxia: Secondary | ICD-10-CM | POA: Diagnosis not present

## 2022-08-14 DIAGNOSIS — G4733 Obstructive sleep apnea (adult) (pediatric): Secondary | ICD-10-CM

## 2022-08-14 DIAGNOSIS — J210 Acute bronchiolitis due to respiratory syncytial virus: Secondary | ICD-10-CM

## 2022-08-14 LAB — RESP PANEL BY RT-PCR (RSV, FLU A&B, COVID)  RVPGX2
Influenza A by PCR: NEGATIVE
Influenza B by PCR: NEGATIVE
Resp Syncytial Virus by PCR: POSITIVE — AB
SARS Coronavirus 2 by RT PCR: NEGATIVE

## 2022-08-14 LAB — BASIC METABOLIC PANEL
Anion gap: 10 (ref 5–15)
BUN: 17 mg/dL (ref 6–20)
CO2: 24 mmol/L (ref 22–32)
Calcium: 9.1 mg/dL (ref 8.9–10.3)
Chloride: 99 mmol/L (ref 98–111)
Creatinine, Ser: 1 mg/dL (ref 0.61–1.24)
GFR, Estimated: >60 mL/min (ref >60)
Glucose, Bld: 114 mg/dL — ABNORMAL HIGH (ref 70–99)
Potassium: 4.1 mmol/L (ref 3.5–5.1)
Sodium: 133 mmol/L — ABNORMAL LOW (ref 135–145)

## 2022-08-14 LAB — CBC WITH DIFFERENTIAL/PLATELET
Abs Immature Granulocytes: 0.04 10*3/uL (ref 0.00–0.07)
Basophils Absolute: 0 10*3/uL (ref 0.0–0.1)
Basophils Relative: 0 %
Eosinophils Absolute: 0 10*3/uL (ref 0.0–0.5)
Eosinophils Relative: 0 %
HCT: 40.9 % (ref 39.0–52.0)
Hemoglobin: 13.6 g/dL (ref 13.0–17.0)
Immature Granulocytes: 1 %
Lymphocytes Relative: 26 %
Lymphs Abs: 1.4 10*3/uL (ref 0.7–4.0)
MCH: 28.6 pg (ref 26.0–34.0)
MCHC: 33.3 g/dL (ref 30.0–36.0)
MCV: 85.9 fL (ref 80.0–100.0)
Monocytes Absolute: 0.4 10*3/uL (ref 0.1–1.0)
Monocytes Relative: 7 %
Neutro Abs: 3.5 10*3/uL (ref 1.7–7.7)
Neutrophils Relative %: 66 %
Platelets: 315 10*3/uL (ref 150–400)
RBC: 4.76 MIL/uL (ref 4.22–5.81)
RDW: 14.3 % (ref 11.5–15.5)
WBC: 5.3 10*3/uL (ref 4.0–10.5)
nRBC: 0 % (ref 0.0–0.2)

## 2022-08-14 LAB — TROPONIN I (HIGH SENSITIVITY)
Troponin I (High Sensitivity): 3 ng/L (ref <18)
Troponin I (High Sensitivity): 3 ng/L (ref <18)

## 2022-08-14 MED ORDER — ALBUTEROL SULFATE (2.5 MG/3ML) 0.083% IN NEBU
INHALATION_SOLUTION | RESPIRATORY_TRACT | Status: AC
  Administered 2022-08-14: 2.5 mg
  Filled 2022-08-14: qty 3

## 2022-08-14 MED ORDER — IPRATROPIUM-ALBUTEROL 0.5-2.5 (3) MG/3ML IN SOLN
3.0000 mL | Freq: Four times a day (QID) | RESPIRATORY_TRACT | Status: DC
  Administered 2022-08-14: 3 mL via RESPIRATORY_TRACT
  Filled 2022-08-14: qty 3

## 2022-08-14 MED ORDER — ALBUTEROL SULFATE HFA 108 (90 BASE) MCG/ACT IN AERS: 2.0000 | INHALATION_SPRAY | RESPIRATORY_TRACT | Status: DC | PRN

## 2022-08-14 MED ORDER — ACETAMINOPHEN 500 MG PO TABS
1000.0000 mg | ORAL_TABLET | Freq: Once | ORAL | Status: DC
  Filled 2022-08-14: qty 2

## 2022-08-14 MED ORDER — ALBUTEROL SULFATE (2.5 MG/3ML) 0.083% IN NEBU: 2.5000 mg | INHALATION_SOLUTION | RESPIRATORY_TRACT | Status: DC | PRN

## 2022-08-14 MED ORDER — ALBUTEROL SULFATE (2.5 MG/3ML) 0.083% IN NEBU
2.5000 mg | INHALATION_SOLUTION | Freq: Once | RESPIRATORY_TRACT | Status: DC
  Administered 2022-08-14: 2.5 mg via RESPIRATORY_TRACT

## 2022-08-14 MED ORDER — SALINE SPRAY 0.65 % NA SOLN
1.0000 | NASAL | Status: DC | PRN
  Administered 2022-08-14: 1 via NASAL
  Filled 2022-08-14 (×2): qty 44

## 2022-08-14 MED ORDER — SODIUM CHLORIDE 0.9 % IV BOLUS
1000.0000 mL | Freq: Once | INTRAVENOUS | Status: AC
  Administered 2022-08-14: 1000 mL via INTRAVENOUS

## 2022-08-14 MED ORDER — ENOXAPARIN SODIUM 80 MG/0.8ML IJ SOSY
70.0000 mg | PREFILLED_SYRINGE | INTRAMUSCULAR | Status: DC
  Administered 2022-08-14: 70 mg via SUBCUTANEOUS
  Filled 2022-08-14: qty 0.8

## 2022-08-14 MED ORDER — MAGNESIUM SULFATE 2 GM/50ML IV SOLN
2.0000 g | Freq: Once | INTRAVENOUS | Status: AC
  Administered 2022-08-14: 2 g via INTRAVENOUS
  Filled 2022-08-14: qty 50

## 2022-08-14 MED ORDER — METHYLPREDNISOLONE SODIUM SUCC 125 MG IJ SOLR
125.0000 mg | Freq: Once | INTRAMUSCULAR | Status: AC
  Administered 2022-08-14: 125 mg via INTRAVENOUS
  Filled 2022-08-14: qty 2

## 2022-08-14 MED ORDER — IPRATROPIUM-ALBUTEROL 0.5-2.5 (3) MG/3ML IN SOLN
RESPIRATORY_TRACT | Status: AC
  Administered 2022-08-14: 3 mL
  Filled 2022-08-14: qty 3

## 2022-08-14 MED ORDER — LORATADINE 10 MG PO TABS
10.0000 mg | ORAL_TABLET | Freq: Every day | ORAL | Status: DC | PRN
  Administered 2022-08-14: 10 mg via ORAL
  Filled 2022-08-14: qty 1

## 2022-08-14 MED ORDER — METHYLPREDNISOLONE SODIUM SUCC 40 MG IJ SOLR
40.0000 mg | Freq: Two times a day (BID) | INTRAMUSCULAR | Status: DC
  Administered 2022-08-14 – 2022-08-16 (×4): 40 mg via INTRAVENOUS
  Filled 2022-08-14 (×4): qty 1

## 2022-08-14 MED ORDER — IPRATROPIUM-ALBUTEROL 0.5-2.5 (3) MG/3ML IN SOLN
3.0000 mL | Freq: Three times a day (TID) | RESPIRATORY_TRACT | Status: DC
  Administered 2022-08-15: 3 mL via RESPIRATORY_TRACT
  Filled 2022-08-14: qty 3

## 2022-08-14 MED ORDER — IPRATROPIUM-ALBUTEROL 0.5-2.5 (3) MG/3ML IN SOLN
3.0000 mL | RESPIRATORY_TRACT | Status: AC
  Administered 2022-08-14: 3 mL via RESPIRATORY_TRACT
  Filled 2022-08-14: qty 3

## 2022-08-14 MED ORDER — GUAIFENESIN 100 MG/5ML PO LIQD
5.0000 mL | ORAL | Status: DC | PRN
  Filled 2022-08-14: qty 10

## 2022-08-14 NOTE — Progress Notes (Signed)
Plan of Care Note for accepted transfer  Patient: Joshua Parrish    XLK:[id number]  DOA: 08/14/2022     Facility requesting transfer: [facility] Requesting Provider: PA Sherrill Raring  Reason for transfer: shortness of breath, hypoxia, RSV bronchitis Facility course:   46 year old male with PMHx HTN presenting with shortness of breath and wheezing despite course of Azithromycin last week.  In ED, patient found to be positive for RSV.  Borderline low BP's in the ED so given 1 liter NS.   Patient desaturated to SpO2 88% with ambulation with HR 130's.    Patient Given 125 solumedrol, 2 grams magnesium sulfate.  3 duo nebs given with no improvement.  CXR negative for pneumonia.  On 2lpm via [place] currently accepting to med surg bed at [facility] or first available.   Plan of care: The patient is accepted for admission to [place]  unit, at [facility].    Author: Vernelle Emerald, MD  08/14/2022  Check [email or web address] for on-call coverage.  Nursing staff, Please call [place] number on Amion as soon as patient's arrival, so appropriate admitting provider can evaluate the pt.

## 2022-08-14 NOTE — ED Notes (Signed)
[facility] at facility for transport 

## 2022-08-14 NOTE — ED Notes (Signed)
Ambulated pt in hallway, upon waking about 15 seconds in, "started to get more SOB and was dizzy", asked if he wanted to continue, he said "we have to find out", so continued on, taking the halfway shortcut by the charge desk, noted to "very dizzy and SOB" and started heavily breathing through nose.  SATs were 89%, with HR of 130, looked like he was going to pass out. Upon sitting down, did blow by for a min, then placed pt on 2L [place]. Breathing slowed down after sitting on for 30 seconds or so. Stated "he felt better on O2".

## 2022-08-14 NOTE — H&P (Signed)
History and Physical  OBE [name]:[id number] DOB: August 18, 1976 DOA: 08/14/2022  Referring physician: Direct admit, accepted by Dr. Cyd Silence, [facility], hospitalist service. PCP: Joshua Parrish, [place]  Outpatient Specialists: None. Patient coming from: Home.  Chief Complaint: Shortness of breath  HPI: Joshua Parrish is a 47 y.o. male with medical history significant for hypertension, obesity, chronic anxiety/depression, who initially presented to [facility] ED with complaints of progressively worsening shortness of breath associated with audible wheezing and a nonproductive cough.  No subjective fevers or chills.  Denies history of asthma.  The patient is a non-smoker however he is exposed to secondhand smoking.  In the ED, chest x-ray nonacute.  COVID-19 screening test, influenza A, and influenza B tests negative.  Respiratory syncytial virus by PCR positive.  Started on IV steroids and breathing treatments.  [facility], hospitalist, was asked to admit.  The patient was accepted by Dr. Cyd Silence, [facility], and transferred to [place] unit as observation status.  ED Course: Tmax 98.3.  BP 126/74, pulse 97, respiratory 18, saturation 96% on 2 L.  Review of Systems: Review of systems as noted in the HPI. All other systems reviewed and are negative.   Past Medical History:  Diagnosis Date   Anxiety    Chicken pox    Depression    Fatty liver    GERD (gastroesophageal reflux disease)    H/O hernia repair 03/31/2022   Hypertension    Migraines    Osteoarthritis of shoulder    left   Rotator cuff impingement syndrome of left shoulder 06/19/2015   Sinusitis    Sleep apnea    Past Surgical History:  Procedure Laterality Date   APPENDECTOMY     CERVICAL LAMINECTOMY     has plates and fusion   CHOLECYSTECTOMY N/A 12/24/2015   Procedure: LAPAROSCOPIC CHOLECYSTECTOMY ;  Surgeon: [name] Pickerel, MD;  Location: [facility];  Service: General;  Laterality: N/A;   CLAVICLE EXCISION Left    HYPOSPADIAS CORRECTION      as child   KNEE SURGERY Bilateral    multiple   SHOULDER ARTHROSCOPY WITH ROTATOR CUFF REPAIR Left 06/26/2015   Procedure: SHOULDER ARTHROSCOPY WITH ROTATOR CUFF REPAIR;  Surgeon: Elsie Saas, MD;  Location: [place];  Service: Orthopedics;  Laterality: Left;    Social History:  reports that he has never smoked. His smokeless tobacco use includes chew. He reports current alcohol use. He reports that he does not use drugs.   Allergies  Allergen Reactions   Silicone Rash    Dermabond   Coconut Oil Diarrhea, Nausea And Vomiting and Other (See Comments)    Allergic to coconuts   Lisinopril Cough    Family History  Problem Relation Age of Onset   Arthritis Mother    Ovarian cancer Mother    Alcohol abuse Father    Breast cancer Maternal Uncle    Breast cancer Maternal Grandmother    Cancer Maternal Grandmother        spinal surgery   Cancer Other    Hypertension Other    Diabetes Other    Breast cancer Maternal Great-grandfather    Colon cancer Neg Hx    Sleep apnea Neg Hx       Prior to Admission medications   Medication Sig Start Date End Date Taking? Authorizing Provider  albuterol (VENTOLIN HFA) 108 (90 Base) MCG/ACT inhaler Inhale 2 puffs into the lungs every 6 (six) hours as needed for wheezing or shortness of breath. 08/11/22  Apolonio Schneiders, FNP  allopurinol (ZYLOPRIM) 300 MG tablet Take 2 tablets (600 mg total) by mouth daily. 06/13/22   Gregor Hams, MD  AMBULATORY NON FORMULARY MEDICATION Compression sleeve left knee 06/26/22   Gregor Hams, MD  amLODipine (NORVASC) 10 MG tablet TAKE 1 TABLET BY MOUTH DAILY 05/06/22   Joshua Salvage, FNP  anastrozole (ARIMIDEX) 1 MG tablet Take 1 mg by mouth every Monday, Wednesday, and Friday.    [provider]  azithromycin (ZITHROMAX Z-PAK) 250 MG tablet Take 2 tablets (500 mg) by mouth today, then 1 tablet (250 mg) by mouth daily x4 days. 08/12/22   Joshua Salvage, FNP  benzonatate  (TESSALON) 100 MG capsule Take 1 capsule (100 mg total) by mouth 3 (three) times daily as needed. 08/11/22   Apolonio Schneiders, FNP  buPROPion (WELLBUTRIN XL) 150 MG 24 hr tablet TAKE ONE TABLET BY MOUTH DAILY 03/12/22   Joshua Salvage, FNP  Cholecalciferol (VITAMIN D3 PO) Take 2 tablets by mouth daily.    [provider]  colchicine 0.6 MG tablet Take 1 tablet (0.6 mg total) by mouth daily. 06/13/22   Gregor Hams, MD  escitalopram (LEXAPRO) 20 MG tablet Take 1 tablet (20 mg total) by mouth daily. 03/21/22   Joshua Salvage, FNP  fluticasone (FLONASE) 50 MCG/ACT nasal spray Place 2 sprays into both nostrils daily. 07/21/22   Perlie Mayo, NP  gabapentin (NEURONTIN) 300 MG capsule Take 2 capsules (600 mg total) by mouth 3 (three) times daily. 02/18/22   Lavina Hamman, MD  HYDROcodone bit-homatropine (HYCODAN) 5-1.5 MG/5ML syrup Take 5 mLs by mouth every 8 (eight) hours as needed for cough. 08/12/22   Joshua Salvage, FNP  ibuprofen (ADVIL) 800 MG tablet Take 1 tablet (800 mg total) by mouth every 8 (eight) hours as needed. 03/31/22   Kinsinger, Arta Bruce, MD  ipratropium (ATROVENT) 0.03 % nasal spray Place 2 sprays into both nostrils every 12 (twelve) hours. 08/28/21   Mar Daring, PA-C  lamoTRIgine (LAMICTAL) 100 MG tablet Take 1 tablet (100 mg total) by mouth 2 (two) times daily. 01/09/22   Marcial Pacas, MD  loratadine (CLARITIN) 10 MG tablet Take 10 mg by mouth daily as needed for allergies or rhinitis.    [provider]  omeprazole (PRILOSEC) 40 MG capsule Take 1 capsule (40 mg total) by mouth daily before breakfast. 02/18/22   Lavina Hamman, MD  polyethylene glycol (MIRALAX) 17 g packet Take 17 g by mouth daily. 02/18/22   Lavina Hamman, MD  predniSONE (DELTASONE) 20 MG tablet Take 1 tablet (20 mg total) by mouth daily with breakfast. 08/12/22   Joshua Salvage, FNP  spironolactone (ALDACTONE) 25 MG tablet TAKE ONE TABLET BY MOUTH DAILY  04/11/22   Joshua Salvage, FNP  tadalafil (CIALIS) 20 MG tablet Take 20 mg by mouth in the morning.    [provider]  testosterone cypionate (DEPOTESTOSTERONE CYPIONATE) 200 MG/ML injection Inject 100 mg into the muscle every Thursday. 03/07/22   [provider]  triamcinolone cream (KENALOG) 0.1 % Apply 1 Application topically 2 (two) times daily. 04/06/22   Gildardo Pounds, NP  valsartan-hydrochlorothiazide (DIOVAN-HCT) 320-25 MG tablet TAKE ONE TABLET BY MOUTH DAILY 05/06/22   Joshua Salvage, FNP    Physical Exam: BP 126/74 (BP Location: Right Arm)   Pulse 86   Temp 98 F (36.7 C) (Oral)   Resp 20   Ht '5\' 9"'$  (1.753 m)  Wt 136.1 kg   SpO2 96%   BMI 44.30 kg/m   General: 46 y.o. year-old male well developed well nourished in no acute distress.  Alert and oriented x3. Cardiovascular: Regular rate and rhythm with no rubs or gallops.  No thyromegaly or JVD noted.  No lower extremity edema. 2/4 pulses in all 4 extremities. Respiratory: Diffuse wheezing bilaterally.  Poor inspiratory effort. Abdomen: Soft nontender nondistended with normal bowel sounds x4 quadrants. Muskuloskeletal: No cyanosis, clubbing or edema noted bilaterally Neuro: CN II-XII intact, strength, sensation, reflexes Skin: No ulcerative lesions noted or rashes Psychiatry: Judgement and insight appear normal. Mood is appropriate for condition and setting          Labs on Admission:  Basic Metabolic Panel: Recent Labs  Lab 08/14/22 1505  NA 133*  K 4.1  CL 99  CO2 24  GLUCOSE 114*  BUN 17  CREATININE 1.00  CALCIUM 9.1   Liver Function Tests: No results for input(s): "AST", "ALT", "ALKPHOS", "BILITOT", "PROT", "ALBUMIN" in the last 168 hours. No results for input(s): "LIPASE", "AMYLASE" in the last 168 hours. No results for input(s): "AMMONIA" in the last 168 hours. CBC: Recent Labs  Lab 08/14/22 1505  WBC 5.3  NEUTROABS 3.5  HGB 13.6  HCT 40.9  MCV 85.9  PLT 315    Cardiac Enzymes: No results for input(s): "CKTOTAL", "CKMB", "CKMBINDEX", "TROPONINI" in the last 168 hours.  BNP (last 3 results) No results for input(s): "BNP" in the last 8760 hours.  ProBNP (last 3 results) No results for input(s): "PROBNP" in the last 8760 hours.  CBG: No results for input(s): "GLUCAP" in the last 168 hours.  Radiological Exams on Admission: DG Chest 2 View  Result Date: 08/14/2022 CLINICAL DATA:  Short of breath for 4-5 days EXAM: CHEST - 2 VIEW COMPARISON:  10/26/2020 FINDINGS: The heart size and mediastinal contours are within normal limits. Both lungs are clear. The visualized skeletal structures are unremarkable. IMPRESSION: No active cardiopulmonary disease. Electronically Signed   By: Randa Ngo M.D.   On: 08/14/2022 14:53    EKG: I independently viewed the EKG done and my findings are as followed: Sinus rhythm rate of 90.  Nonspecific ST-T changes.  QTc 440.  Assessment/Plan Present on Admission:  RSV bronchitis  Principal Problem:   RSV bronchitis  RSV bronchitis, POA Continue supportive care IV Solu-Medrol 40 mg twice daily, DuoNebs every 6 hours, incentive spirometer Mobilize as tolerated  Acute hypoxic respiratory failure secondary to above Not on oxygen supplementation at baseline Currently on 2 L to maintain a saturation greater than 90% Wean off oxygen supplementation as tolerated  Severe morbid obesity BMI 44 OSA on CPAP Resume home regimen Recommend weight loss outpatient with regular physical activity and healthy dieting.  Chronic anxiety/depression Essential hypertension Resume home regimen. Continue to monitor vital signs   DVT prophylaxis: Subcu Lovenox daily  Code Status: Full code  Family Communication: None at bedside.  Disposition Plan: Admitted to MedSurg unit as observation status.  Consults called: None.  Admission status: Observation status.   Status is: Observation    Kayleen Memos MD [facility] Pager [phone]  If 7PM-7AM, please contact night-coverage [email or web address] Password [email or web address]  08/15/2022, 2:11 AM

## 2022-08-14 NOTE — Plan of Care (Signed)

## 2022-08-14 NOTE — ED Notes (Signed)
[facility] at bedside to transport

## 2022-08-14 NOTE — Progress Notes (Signed)
Joshua Parrish is a 45 y.o. 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   Low back pain 01/09/2022   Rotator cuff disorder, left 12/25/2017   Bilateral shoulder pain 05/22/2017   Left knee pain 05/22/2017   Urinary frequency 02/09/2017   Essential hypertension 01/16/2017   Prostatitis 12/25/2016   Sepsis ([place]) 12/25/2016   GERD (gastroesophageal reflux disease) 12/25/2016   Sinusitis 12/09/2016   Tendinitis of right rotator cuff 12/09/2016   Laryngopharyngeal reflux (LPR) 02/04/2016   Mass in neck 11/27/2015   Rotator cuff impingement syndrome of left shoulder 06/19/2015   Acromioclavicular joint arthritis 06/19/2015    No current facility-administered medications for this visit.   Current Outpatient Medications  Medication Sig Dispense Refill   albuterol (VENTOLIN HFA) 108 (90 Base) MCG/ACT inhaler Inhale 2 puffs into the lungs every 6 (six) hours as needed for wheezing or shortness of breath. 8 g 0   allopurinol (ZYLOPRIM) 300 MG tablet Take 2 tablets (600 mg total) by mouth daily. 180 tablet 3   AMBULATORY NON FORMULARY MEDICATION Compression sleeve left knee 1 each 0   amLODipine (NORVASC) 10 MG tablet TAKE 1 TABLET BY MOUTH DAILY 30 tablet 3   anastrozole (ARIMIDEX) 1 MG tablet Take 1 mg by mouth every Monday, Wednesday, and Friday.     azithromycin (ZITHROMAX Z-PAK) 250 MG tablet Take 2 tablets (500 mg) by mouth today, then 1 tablet (250 mg) by mouth daily x4 days. 6 tablet 0   benzonatate (TESSALON) 100 MG capsule Take 1 capsule (100 mg total) by mouth 3 (three) times daily as needed. 30 capsule 0   buPROPion (WELLBUTRIN XL) 150 MG 24 hr tablet TAKE ONE TABLET BY MOUTH DAILY 90 tablet 1   Cholecalciferol (VITAMIN D3 PO)  Take 2 tablets by mouth daily.     colchicine 0.6 MG tablet Take 1 tablet (0.6 mg total) by mouth daily. 60 tablet 1   escitalopram (LEXAPRO) 20 MG tablet Take 1 tablet (20 mg total) by mouth daily. 90 tablet 3   fluticasone (FLONASE) 50 MCG/ACT nasal spray Place 2 sprays into both nostrils daily. 16 g 0   gabapentin (NEURONTIN) 300 MG capsule Take 2 capsules (600 mg total) by mouth 3 (three) times daily. 90 capsule 0   HYDROcodone bit-homatropine (HYCODAN) 5-1.5 MG/5ML syrup Take 5 mLs by mouth every 8 (eight) hours as needed for cough. 120 mL 0   ibuprofen (ADVIL) 800 MG tablet Take 1 tablet (800 mg total) by mouth every 8 (eight) hours as needed. 30 tablet 0   ipratropium (ATROVENT) 0.03 % nasal spray Place 2 sprays into both nostrils every 12 (twelve) hours. 30 mL 0   lamoTRIgine (LAMICTAL) 100 MG tablet Take 1 tablet (100 mg total) by mouth 2 (two) times daily. 60 tablet 11   loratadine (CLARITIN) 10 MG tablet Take 10 mg by mouth daily as needed for allergies or rhinitis.     omeprazole (PRILOSEC) 40 MG capsule Take 1 capsule (40 mg total) by mouth daily before breakfast. 30 capsule 0   polyethylene glycol (MIRALAX) 17 g packet Take 17 g by mouth daily. 14 each 0   predniSONE (DELTASONE) 20 MG tablet Take 1 tablet (20 mg total) by mouth  daily with breakfast. 5 tablet 0   spironolactone (ALDACTONE) 25 MG tablet TAKE ONE TABLET BY MOUTH DAILY 30 tablet 6   tadalafil (CIALIS) 20 MG tablet Take 20 mg by mouth in the morning.     testosterone cypionate (DEPOTESTOSTERONE CYPIONATE) 200 MG/ML injection Inject 100 mg into the muscle every Thursday.     triamcinolone cream (KENALOG) 0.1 % Apply 1 Application topically 2 (two) times daily. 30 g 0   valsartan-hydrochlorothiazide (DIOVAN-HCT) 320-25 MG tablet TAKE ONE TABLET BY MOUTH DAILY 30 tablet 3   Facility-Administered Medications Ordered in Other Visits  Medication Dose Route Frequency Provider Last Rate Last Admin   albuterol (VENTOLIN HFA) 108  (90 Base) MCG/ACT inhaler 2 puff  2 puff Inhalation Q2H PRN Drenda Freeze, MD        Allergies: Silicone, Coconut oil, and Lisinopril  Past Medical History:  Diagnosis Date   Anxiety    Chicken pox    Depression    Fatty liver    GERD (gastroesophageal reflux disease)    H/O hernia repair 03/31/2022   Hypertension    Migraines    Osteoarthritis of shoulder    left   Rotator cuff impingement syndrome of left shoulder 06/19/2015   Sinusitis    Sleep apnea     Past Surgical History:  Procedure Laterality Date   APPENDECTOMY     CERVICAL LAMINECTOMY     has plates and fusion   CHOLECYSTECTOMY N/A 12/24/2015   Procedure: LAPAROSCOPIC CHOLECYSTECTOMY ;  Surgeon: [name] Pickerel, MD;  Location: [facility];  Service: General;  Laterality: N/A;   CLAVICLE EXCISION Left    HYPOSPADIAS CORRECTION     as child   KNEE SURGERY Bilateral    multiple   SHOULDER ARTHROSCOPY WITH ROTATOR CUFF REPAIR Left 06/26/2015   Procedure: SHOULDER ARTHROSCOPY WITH ROTATOR CUFF REPAIR;  Surgeon: Elsie Saas, MD;  Location: [place];  Service: Orthopedics;  Laterality: Left;    Family History  Problem Relation Age of Onset   Arthritis Mother    Ovarian cancer Mother    Alcohol abuse Father    Breast cancer Maternal Uncle    Breast cancer Maternal Grandmother    Cancer Maternal Grandmother        spinal surgery   Cancer Other    Hypertension Other    Diabetes Other    Breast cancer Maternal Great-grandfather    Colon cancer Neg Hx    Sleep apnea Neg Hx     Social History   Tobacco Use   Smoking status: Never   Smokeless tobacco: Current    Types: Chew   Tobacco comments:    form given 11/29/15  Substance Use Topics   Alcohol use: Yes    Alcohol/week: 0.0 standard drinks of alcohol    Comment: occasional    Subjective:   Seen 2 days ago with suspected bronchitis; was given oral prednisone, Z-pak, albuterol; notes that in past 24 hours symptoms have worsened and feels  like having very difficult time breathing; notes that became very light headed in office when walking into office;     Objective:  Vitals:   08/14/22 1303  BP: 126/62  Pulse: 91  Resp: 18  Temp: 98.3 F (36.8 C)  TempSrc: Oral  SpO2: 98%  Weight: (!) 313 lb 6.4 oz (142.2 kg)  Height: '5\' 9"'$  (1.753 m)    General: Well developed, well nourished, in no acute distress  Skin : Warm and dry.  Head: Normocephalic  and atraumatic  Eyes: Sclera and conjunctiva clear; pupils round and reactive to light; extraocular movements intact  Ears: External normal; canals clear; tympanic membranes normal  Oropharynx: Pink, supple. No suspicious lesions  Neck: Supple without thyromegaly, adenopathy  Lungs: Respirations labored; wheezing noted in all 4 lobes CVS exam: normal rate and regular rhythm.  Neurologic: Alert and oriented; speech intact; face symmetrical; moves all extremities well; CNII-XII intact without focal deficit   Assessment:  1. Acute cough   2. Shortness of breath   3. Dyspnea on exertion     Plan:  Duo-neb treatment given in office with no improvement in symptoms; he is taken to ER for immediate evaluation; ER provider is notified that patient is being sent for evaluation.   No follow-ups on file.  No orders of the defined types were placed in this encounter.   Requested Prescriptions    No prescriptions requested or ordered in this encounter

## 2022-08-14 NOTE — ED Notes (Signed)
Patient transported to X-ray 

## 2022-08-14 NOTE — ED Triage Notes (Signed)
Pt reports SHOB x 4-5 days. He has seen his PCP who prescribed him a zpack on Tuesday. He saw his PCP again today who he says told him he's "getting worse". Denies fever that he knows of. Productive cough (dark brown), reports new chest tightness today. Oxygen saturation at triage 94%, audible wheezes. RRT called to triage to assess.

## 2022-08-14 NOTE — ED Provider Notes (Signed)
[facility] EMERGENCY DEPARTMENT Provider Note   CSN: [id number] Arrival date & time: 08/14/22  1340     History  Chief Complaint  Patient presents with   Shortness of Breath    Joshua Parrish is a 46 y.o. male.   Shortness of Breath    Patient presents due to shortness of breath that started about a week ago.  He has been wheezing, given albuterol inhaler, Z-Pak and prednisone by his primary.  He has not been improving at all.  States today he was seen by his primary sent him to ED for additional evaluation.  He denies any history of asthma or COPD, does not smoke cigarettes.  He has been had intermittently productive cough.  He is also having chest tightness which started today and has been constant, is worse with inspiration.  No lower extremity swelling, history of PE or ACS.  Takes medicine for hypertension, anxiety, depression.  Home Medications Prior to Admission medications   Medication Sig Start Date End Date Taking? Authorizing Provider  albuterol (VENTOLIN HFA) 108 (90 Base) MCG/ACT inhaler Inhale 2 puffs into the lungs every 6 (six) hours as needed for wheezing or shortness of breath. 08/11/22   Apolonio Schneiders, FNP  allopurinol (ZYLOPRIM) 300 MG tablet Take 2 tablets (600 mg total) by mouth daily. 06/13/22   Gregor Hams, MD  AMBULATORY NON FORMULARY MEDICATION Compression sleeve left knee 06/26/22   Gregor Hams, MD  amLODipine (NORVASC) 10 MG tablet TAKE 1 TABLET BY MOUTH DAILY 05/06/22   Marrian Salvage, FNP  anastrozole (ARIMIDEX) 1 MG tablet Take 1 mg by mouth every Monday, Wednesday, and Friday.    [provider]  azithromycin (ZITHROMAX Z-PAK) 250 MG tablet Take 2 tablets (500 mg) by mouth today, then 1 tablet (250 mg) by mouth daily x4 days. 08/12/22   Marrian Salvage, FNP  benzonatate (TESSALON) 100 MG capsule Take 1 capsule (100 mg total) by mouth 3 (three) times daily as needed. 08/11/22   Apolonio Schneiders, FNP  buPROPion (WELLBUTRIN  XL) 150 MG 24 hr tablet TAKE ONE TABLET BY MOUTH DAILY 03/12/22   Marrian Salvage, FNP  Cholecalciferol (VITAMIN D3 PO) Take 2 tablets by mouth daily.    [provider]  colchicine 0.6 MG tablet Take 1 tablet (0.6 mg total) by mouth daily. 06/13/22   Gregor Hams, MD  escitalopram (LEXAPRO) 20 MG tablet Take 1 tablet (20 mg total) by mouth daily. 03/21/22   Marrian Salvage, FNP  fluticasone (FLONASE) 50 MCG/ACT nasal spray Place 2 sprays into both nostrils daily. 07/21/22   Perlie Mayo, NP  gabapentin (NEURONTIN) 300 MG capsule Take 2 capsules (600 mg total) by mouth 3 (three) times daily. 02/18/22   Lavina Hamman, MD  HYDROcodone bit-homatropine (HYCODAN) 5-1.5 MG/5ML syrup Take 5 mLs by mouth every 8 (eight) hours as needed for cough. 08/12/22   Marrian Salvage, FNP  ibuprofen (ADVIL) 800 MG tablet Take 1 tablet (800 mg total) by mouth every 8 (eight) hours as needed. 03/31/22   Kinsinger, Arta Bruce, MD  ipratropium (ATROVENT) 0.03 % nasal spray Place 2 sprays into both nostrils every 12 (twelve) hours. 08/28/21   Mar Daring, PA-C  lamoTRIgine (LAMICTAL) 100 MG tablet Take 1 tablet (100 mg total) by mouth 2 (two) times daily. 01/09/22   Marcial Pacas, MD  loratadine (CLARITIN) 10 MG tablet Take 10 mg by mouth daily as needed for allergies or rhinitis.  [provider]  omeprazole (PRILOSEC) 40 MG capsule Take 1 capsule (40 mg total) by mouth daily before breakfast. 02/18/22   Lavina Hamman, MD  polyethylene glycol (MIRALAX) 17 g packet Take 17 g by mouth daily. 02/18/22   Lavina Hamman, MD  predniSONE (DELTASONE) 20 MG tablet Take 1 tablet (20 mg total) by mouth daily with breakfast. 08/12/22   Marrian Salvage, FNP  spironolactone (ALDACTONE) 25 MG tablet TAKE ONE TABLET BY MOUTH DAILY 04/11/22   Marrian Salvage, FNP  tadalafil (CIALIS) 20 MG tablet Take 20 mg by mouth in the morning.    [provider]  testosterone  cypionate (DEPOTESTOSTERONE CYPIONATE) 200 MG/ML injection Inject 100 mg into the muscle every Thursday. 03/07/22   [provider]  triamcinolone cream (KENALOG) 0.1 % Apply 1 Application topically 2 (two) times daily. 04/06/22   Gildardo Pounds, NP  valsartan-hydrochlorothiazide (DIOVAN-HCT) 320-25 MG tablet TAKE ONE TABLET BY MOUTH DAILY 05/06/22   Marrian Salvage, FNP      Allergies    Silicone, Coconut oil, and Lisinopril    Review of Systems   Review of Systems  Respiratory:  Positive for shortness of breath.     Physical Exam Updated Vital Signs BP (!) 137/91   Pulse 99   Temp 98.1 F (36.7 C)   Resp (!) 24   Ht '5\' 9"'$  (1.753 m)   Wt 136.1 kg   SpO2 96%   BMI 44.30 kg/m  Physical Exam Vitals and nursing note reviewed. Exam conducted with a chaperone present.  Constitutional:      Appearance: Normal appearance. He is obese.  HENT:     Head: Normocephalic and atraumatic.  Eyes:     General: No scleral icterus.       Right eye: No discharge.        Left eye: No discharge.     Extraocular Movements: Extraocular movements intact.     Pupils: Pupils are equal, round, and reactive to light.  Cardiovascular:     Rate and Rhythm: Regular rhythm. Tachycardia present.     Pulses: Normal pulses.     Heart sounds: Normal heart sounds. No murmur heard.    No friction rub. No gallop.  Pulmonary:     Effort: Pulmonary effort is normal. Tachypnea present. No respiratory distress.     Breath sounds: Wheezing present.  Abdominal:     General: Abdomen is flat. Bowel sounds are normal. There is no distension.     Palpations: Abdomen is soft.     Tenderness: There is no abdominal tenderness.  Musculoskeletal:     Right lower leg: No edema.     Left lower leg: No edema.  Skin:    General: Skin is warm and dry.     Coloration: Skin is not jaundiced.  Neurological:     Mental Status: He is alert. Mental status is at baseline.     Coordination: Coordination normal.      ED Results / Procedures / Treatments   Labs (all labs ordered are listed, but only abnormal results are displayed) Labs Reviewed  RESP PANEL BY RT-PCR (RSV, FLU A&B, COVID)  RVPGX2 - Abnormal; Notable for the following components:      Result Value   Resp Syncytial Virus by PCR POSITIVE (*)    All other components within normal limits  BASIC METABOLIC PANEL - Abnormal; Notable for the following components:   Sodium 133 (*)    Glucose, Bld 114 (*)  All other components within normal limits  CBC WITH DIFFERENTIAL/PLATELET  TROPONIN I (HIGH SENSITIVITY)  TROPONIN I (HIGH SENSITIVITY)    EKG EKG Interpretation  Date/Time:  Thursday August 14 2022 14:01:35 EST Ventricular Rate:  90 PR Interval:  138 QRS Duration: 90 QT Interval:  360 QTC Calculation: 440 R Axis:   37 Text Interpretation: Normal sinus rhythm Normal ECG When compared with ECG of 20-Feb-2022 18:23, PREVIOUS ECG IS PRESENT Confirmed by Wandra Arthurs [phone]) on 08/14/2022 4:33:55 PM  Radiology DG Chest 2 View  Result Date: 08/14/2022 CLINICAL DATA:  Short of breath for 4-5 days EXAM: CHEST - 2 VIEW COMPARISON:  10/26/2020 FINDINGS: The heart size and mediastinal contours are within normal limits. Both lungs are clear. The visualized skeletal structures are unremarkable. IMPRESSION: No active cardiopulmonary disease. Electronically Signed   By: Randa Ngo M.D.   On: 08/14/2022 14:53    Procedures Procedures    Medications Ordered in ED Medications  albuterol (VENTOLIN HFA) 108 (90 Base) MCG/ACT inhaler 2 puff (has no administration in time range)  ipratropium-albuterol (DUONEB) 0.5-2.5 (3) MG/3ML nebulizer solution (3 mLs  Given 08/14/22 1405)  albuterol (PROVENTIL) (2.5 MG/3ML) 0.083% nebulizer solution (2.5 mg  Given 08/14/22 1405)  ipratropium-albuterol (DUONEB) 0.5-2.5 (3) MG/3ML nebulizer solution 3 mL (3 mLs Nebulization Given 08/14/22 1532)  methylPREDNISolone sodium succinate (SOLU-MEDROL) 125  mg/2 mL injection 125 mg (125 mg Intravenous Given 08/14/22 1533)  magnesium sulfate IVPB 2 g 50 mL (0 g Intravenous Stopped 08/14/22 1633)  sodium chloride 0.9 % bolus 1,000 mL (0 mLs Intravenous Stopped 08/14/22 1733)    ED Course/ Medical Decision Making/ A&P                           Medical Decision Making Amount and/or Complexity of Data Reviewed Radiology: ordered.  Risk Prescription drug management. Decision regarding hospitalization.   Patient presents due to cough and shortness of breath to his primary care office.  Differential includes but not limited to acute respiratory distress, COVID, flu, RSV, pneumonia.   On exam patient is diffusely wheezing, tachypneic and tachycardic.  He is afebrile.  Borderline soft blood pressures.  I ordered, viewed and interpreted laboratory workup.  No leukocytosis or anemia.  Troponin is low.  BMP without gross electrolyte derangement or AKI.  Patient is notably RSV positive.  Chest x-ray is negative for any pneumonia, agree with radiologist read.  EKG shows sinus tachycardia without any specific ischemic changes.  Patient is on cardiac monitoring in sinus tachycardia per my interpretation.  I reevaluated the patient he became hypotensive at 90/60.  I ordered a liter fluid bolus.  I have also ordered 3 DuoNebs, mag to treat as if it is an asthma exacerbation due to the diffuse wheezing.  Patient felt somewhat improved after breathing treatments but not significantly.  We ambulated the patient with pulse ox, he became tachycardic in the 130s and desatted to 8889% on room air.  Put on 2 L supplemental oxygen.  Initially I was planning on sending the patient home but given new hypoxia with RSV patient will need admission.         Final Clinical Impression(s) / ED Diagnoses Final diagnoses:  RSV (acute bronchiolitis due to respiratory syncytial virus)  Hypoxia    Rx / DC Orders ED Discharge Orders     None         Sherrill Raring, Hershal Coria 08/14/22 [place],  Lujean Rave, MD 08/14/22 2023

## 2022-08-14 NOTE — ED Notes (Signed)
RT assessed patientin triage. BBS wheezing and very SOB. Albuterol Atrovebnt neb given when he arrived in him room. Patient placed on monitor. Rt to monitor as needed

## 2022-08-15 DIAGNOSIS — G4733 Obstructive sleep apnea (adult) (pediatric): Secondary | ICD-10-CM

## 2022-08-15 DIAGNOSIS — I10 Essential (primary) hypertension: Secondary | ICD-10-CM

## 2022-08-15 DIAGNOSIS — K219 Gastro-esophageal reflux disease without esophagitis: Secondary | ICD-10-CM

## 2022-08-15 LAB — CBC
HCT: 42.1 % (ref 39.0–52.0)
Hemoglobin: 13.7 g/dL (ref 13.0–17.0)
MCH: 29.1 pg (ref 26.0–34.0)
MCHC: 32.5 g/dL (ref 30.0–36.0)
MCV: 89.6 fL (ref 80.0–100.0)
Platelets: 310 10*3/uL (ref 150–400)
RBC: 4.7 MIL/uL (ref 4.22–5.81)
RDW: 14.3 % (ref 11.5–15.5)
WBC: 6.6 10*3/uL (ref 4.0–10.5)
nRBC: 0 % (ref 0.0–0.2)

## 2022-08-15 LAB — MAGNESIUM: Magnesium: 2.5 mg/dL — ABNORMAL HIGH (ref 1.7–2.4)

## 2022-08-15 LAB — BASIC METABOLIC PANEL
Anion gap: 11 (ref 5–15)
BUN: 15 mg/dL (ref 6–20)
CO2: 26 mmol/L (ref 22–32)
Calcium: 9.4 mg/dL (ref 8.9–10.3)
Chloride: 102 mmol/L (ref 98–111)
Creatinine, Ser: 0.73 mg/dL (ref 0.61–1.24)
GFR, Estimated: >60 mL/min (ref >60)
Glucose, Bld: 133 mg/dL — ABNORMAL HIGH (ref 70–99)
Potassium: 4.5 mmol/L (ref 3.5–5.1)
Sodium: 139 mmol/L (ref 135–145)

## 2022-08-15 LAB — PHOSPHORUS: Phosphorus: 4.3 mg/dL (ref 2.5–4.6)

## 2022-08-15 MED ORDER — IPRATROPIUM-ALBUTEROL 0.5-2.5 (3) MG/3ML IN SOLN
3.0000 mL | Freq: Once | RESPIRATORY_TRACT | Status: DC
  Administered 2022-08-14: 3 mL via RESPIRATORY_TRACT

## 2022-08-15 MED ORDER — POLYETHYLENE GLYCOL 3350 17 G PO PACK: 17.0000 g | PACK | Freq: Every day | ORAL | Status: DC | PRN

## 2022-08-15 MED ORDER — IPRATROPIUM-ALBUTEROL 0.5-2.5 (3) MG/3ML IN SOLN
3.0000 mL | Freq: Four times a day (QID) | RESPIRATORY_TRACT | Status: DC
  Administered 2022-08-15 – 2022-08-16 (×4): 3 mL via RESPIRATORY_TRACT
  Filled 2022-08-15 (×4): qty 3

## 2022-08-15 MED ORDER — OXYMETAZOLINE HCL 0.05 % NA SOLN
1.0000 | Freq: Two times a day (BID) | NASAL | Status: DC | PRN
  Administered 2022-08-16: 1 via NASAL
  Filled 2022-08-15: qty 15

## 2022-08-15 MED ORDER — HYDRALAZINE HCL 20 MG/ML IJ SOLN: 10.0000 mg | Freq: Four times a day (QID) | INTRAMUSCULAR | Status: DC | PRN

## 2022-08-15 MED ORDER — IPRATROPIUM-ALBUTEROL 0.5-2.5 (3) MG/3ML IN SOLN: 3.0000 mL | Freq: Two times a day (BID) | RESPIRATORY_TRACT | Status: DC

## 2022-08-15 MED ORDER — MELATONIN 3 MG PO TABS
3.0000 mg | ORAL_TABLET | Freq: Every evening | ORAL | Status: DC | PRN
  Administered 2022-08-16: 3 mg via ORAL
  Filled 2022-08-15: qty 1

## 2022-08-15 MED ORDER — LAMOTRIGINE 100 MG PO TABS
100.0000 mg | ORAL_TABLET | Freq: Two times a day (BID) | ORAL | Status: DC
  Administered 2022-08-15 – 2022-08-16 (×3): 100 mg via ORAL
  Filled 2022-08-15 (×3): qty 1

## 2022-08-15 MED ORDER — PANTOPRAZOLE SODIUM 40 MG PO TBEC
40.0000 mg | DELAYED_RELEASE_TABLET | Freq: Every day | ORAL | Status: DC
  Administered 2022-08-15 – 2022-08-16 (×2): 40 mg via ORAL
  Filled 2022-08-15 (×2): qty 1

## 2022-08-15 MED ORDER — GABAPENTIN 300 MG PO CAPS: 600.0000 mg | ORAL_CAPSULE | Freq: Three times a day (TID) | ORAL | Status: DC | PRN

## 2022-08-15 MED ORDER — PROCHLORPERAZINE EDISYLATE 10 MG/2ML IJ SOLN: 5.0000 mg | Freq: Four times a day (QID) | INTRAMUSCULAR | Status: DC | PRN

## 2022-08-15 MED ORDER — ACETAMINOPHEN 325 MG PO TABS: 650.0000 mg | ORAL_TABLET | Freq: Four times a day (QID) | ORAL | Status: DC | PRN

## 2022-08-15 MED ORDER — ESCITALOPRAM OXALATE 20 MG PO TABS
20.0000 mg | ORAL_TABLET | Freq: Every day | ORAL | Status: DC
  Administered 2022-08-15 – 2022-08-16 (×2): 20 mg via ORAL
  Filled 2022-08-15 (×2): qty 1

## 2022-08-15 NOTE — Assessment & Plan Note (Signed)
.   Continuing home regimen of CPAP nightly  

## 2022-08-15 NOTE — Assessment & Plan Note (Signed)
Continuing home regimen of daily PPI therapy.  

## 2022-08-15 NOTE — Assessment & Plan Note (Signed)
Currently normotensive without scheduled antihypertensive therapy As needed intravenous angiotensins for markedly elevated blood pressure

## 2022-08-15 NOTE — Addendum Note (Signed)
Addended by: Sena Hitch on: 08/15/2022 08:52 AM   Modules accepted: Orders

## 2022-08-15 NOTE — TOC Progression Note (Signed)
Transition of Care [facility]) - Progression Note    Patient Details  Name: Joshua Parrish MRN: [id number] Date of Birth: September 10, 1975  Transition of Care [facility]) CM/SW Contact  Servando Snare, [place] Phone Number: 08/15/2022, 8:32 AM  Clinical Narrative:      Transition of Care (TOC) Screening Note   Patient Details  Name: Joshua Parrish Date of Birth: [birth year]   Transition of Care [facility]) CM/SW Contact:    Servando Snare, LCSW Phone Number: 08/15/2022, 8:32 AM    Transition of Care Department [facility]) has reviewed patient and no TOC needs have been identified at this time. We will continue to monitor patient advancement through interdisciplinary progression rounds. If new patient transition needs arise, please place a TOC consult.         Expected Discharge Plan and Services                                                 Social Determinants of Health (SDOH) Interventions    Readmission Risk Interventions     No data to display

## 2022-08-15 NOTE — Hospital Course (Signed)
46 year old male with past medical history of hypertension, obesity, anxiety disorder, depression who presented to [facility] emergency department with complaints of shortness of breath wheezing and cough.  Upon evaluation in the emergency department patient was found to be intermittently hypoxic with attempted ambulation and extremely short of breath.  Initial workup revealed RSV PCR testing was positive.  Due to substantial shortness of breath and intermittent hypoxia patient initiated on bronchodilator therapy and systemic steroids and the hospice group was then called to assess the patient for admission to the hospital.  Patient was then accepted to [facility] long hospital for continued medical care.  In the days that followed patient was continued on aggressive bronchodilator therapy as well as systemic steroids for substantial wheezing.  Supportive care was provided and patient gradually improved over the next several days.  As patient clinically improved, oxygen was weaned and discontinued.  Patient was discharged in improved and stable condition on 12/16.  Patient was discharged home on an additional 4-day burst of oral prednisone as well as an albuterol inhaler.  Patient was instructed to avoid strenuous activity for an additional 3 days upon returning to work.

## 2022-08-15 NOTE — Progress Notes (Signed)
Mobility Specialist - Progress Note   08/15/22 1051  Mobility  Activity Ambulated with assistance in hallway  Level of Assistance Independent after set-up  Assistive Device None  Distance Ambulated (ft) 250 ft  Activity Response Tolerated well  Mobility Referral Yes  $Mobility charge 1 Mobility   Nurse requested Mobility Specialist to perform oxygen saturation test with pt which includes removing pt from oxygen both at rest and while ambulating.  Below are the results from that testing.     Patient Saturations on Room Air at Rest = spO2 95%  Patient Saturations on Room Air while Ambulating = sp02 96% .    At end of testing pt left in room on 0  Liters of oxygen.  Reported results to nurse.   Pt received in bed and agreed to ambulate. Pt felt SOB nearing midway point, SpO2 was at 95%. Pt returned to bed with all needs met.   Roderick Pee Mobility Specialist

## 2022-08-15 NOTE — Progress Notes (Signed)
PROGRESS NOTE   Joshua Parrish  DJS:[id number] DOB: 1976-05-31 DOA: 08/14/2022 PCP: Marrian Salvage, FNP   Date of Service: the patient was seen and examined on 08/15/2022  Brief Narrative:  46 year old male with past medical history of hypertension, obesity, anxiety disorder, depression who presented to [facility] emergency department with complaints of shortness of breath wheezing and cough.  Upon evaluation in the emergency department patient was found to be intermittently hypoxic with attempted ambulation and extremely short of breath.  Initial workup revealed RSV PCR testing was positive.  Due to substantial shortness of breath and intermittent hypoxia patient initiated on bronchodilator therapy and systemic steroids and the hospice group was then called to assess the patient for admission to the hospital.  Patient was then accepted to [facility] long hospital for continued medical care.    Assessment and Plan: * RSV bronchitis Patient somewhat clinically improved since hospitalization however patient continuing to exhibit shortness of breath and wheezing Continue to provide patient with systemic steroids Continuing aggressive bronchodilator therapy Supplemental oxygen as necessary for bouts of hypoxia  Essential hypertension Currently normotensive without scheduled antihypertensive therapy As needed intravenous angiotensins for markedly elevated blood pressure  OSA on CPAP Continuing home regimen of CPAP nightly   GERD without esophagitis Continuing home regimen of daily PPI therapy.         Subjective:  Patient continues to complain of shortness of breath.  Shortness of breath is moderate to severe in intensity, worse with exertion and improved with rest.  Patient complains of associated generalized weakness and fatigue.  Patient complains of ongoing wheezing with continued cough.  Physical Exam:  Vitals:   08/15/22 0726 08/15/22 0825 08/15/22 1328  08/15/22 1936  BP: (!) 148/87  (!) 149/87 120/64  Pulse: 82  89 75  Resp: '20  18 20  '$ Temp: 97.7 F (36.5 C)  98.2 F (36.8 C) 97.9 F (36.6 C)  TempSrc: Oral  Oral Oral  SpO2: 95% 97% 94% 96%  Weight:      Height:         Constitutional: Awake alert and oriented x3, no associated distress.   Skin: no rashes, no lesions, good skin turgor noted. Eyes: Pupils are equally reactive to light.  No evidence of scleral icterus or conjunctival pallor.  ENMT: Moist mucous membranes noted.  Posterior pharynx clear of any exudate or lesions.   Respiratory: Coarse breath is with expiratory wheezing noted in all fields.  No evidence of rales.  Normal respiratory effort. No accessory muscle use.  Cardiovascular: Regular rate and rhythm, no murmurs / rubs / gallops. No extremity edema. 2+ pedal pulses. No carotid bruits.  Abdomen: Abdomen is protuberant but soft and nontender.  No evidence of intra-abdominal masses.  Positive bowel sounds noted in all quadrants.   Musculoskeletal: No joint deformity upper and lower extremities. Good ROM, no contractures. Normal muscle tone.    Data Reviewed:  I have personally reviewed and interpreted labs, imaging.  Significant findings are   CBC: Recent Labs  Lab 08/14/22 1505 08/15/22 0619  WBC 5.3 6.6  NEUTROABS 3.5  --   HGB 13.6 13.7  HCT 40.9 42.1  MCV 85.9 89.6  PLT 315 885   Basic Metabolic Panel: Recent Labs  Lab 08/14/22 1505 08/15/22 0619  NA 133* 139  K 4.1 4.5  CL 99 102  CO2 24 26  GLUCOSE 114* 133*  BUN 17 15  CREATININE 1.00 0.73  CALCIUM 9.1 9.4  MG  --  2.5*  PHOS  --  4.3     Code Status:  Full code.  Code status decision has been confirmed with: patient Family Communication: Mother is at the bedside who has been updated on plan of care   Severity of Illness:  The appropriate patient status for this patient is OBSERVATION. Observation status is judged to be reasonable and necessary in order to provide the required  intensity of service to ensure the patient's safety. The patient's presenting symptoms, physical exam findings, and initial radiographic and laboratory data in the context of their medical condition is felt to place them at decreased risk for further clinical deterioration. Furthermore, it is anticipated that the patient will be medically stable for discharge from the hospital within 2 midnights of admission.   Time spent:  34 minutes  Author:  Vernelle Emerald MD  08/15/2022 8:07 PM

## 2022-08-15 NOTE — Assessment & Plan Note (Signed)
Patient somewhat clinically improved since hospitalization however patient continuing to exhibit shortness of breath and wheezing Continue to provide patient with systemic steroids Continuing aggressive bronchodilator therapy Supplemental oxygen as necessary for bouts of hypoxia

## 2022-08-16 ENCOUNTER — Other Ambulatory Visit (HOSPITAL_COMMUNITY): Payer: Self-pay

## 2022-08-16 LAB — COMPREHENSIVE METABOLIC PANEL
ALT: 37 U/L (ref 0–44)
AST: 22 U/L (ref 15–41)
Albumin: 3.9 g/dL (ref 3.5–5.0)
Alkaline Phosphatase: 54 U/L (ref 38–126)
Anion gap: 9 (ref 5–15)
BUN: 22 mg/dL — ABNORMAL HIGH (ref 6–20)
CO2: 24 mmol/L (ref 22–32)
Calcium: 9.1 mg/dL (ref 8.9–10.3)
Chloride: 104 mmol/L (ref 98–111)
Creatinine, Ser: 0.89 mg/dL (ref 0.61–1.24)
GFR, Estimated: >60 mL/min (ref >60)
Glucose, Bld: 134 mg/dL — ABNORMAL HIGH (ref 70–99)
Potassium: 4.5 mmol/L (ref 3.5–5.1)
Sodium: 137 mmol/L (ref 135–145)
Total Bilirubin: 0.4 mg/dL (ref 0.3–1.2)
Total Protein: 6.4 g/dL — ABNORMAL LOW (ref 6.5–8.1)

## 2022-08-16 LAB — CBC WITH DIFFERENTIAL/PLATELET
Abs Immature Granulocytes: 0.08 10*3/uL — ABNORMAL HIGH (ref 0.00–0.07)
Basophils Absolute: 0 10*3/uL (ref 0.0–0.1)
Basophils Relative: 0 %
Eosinophils Absolute: 0 10*3/uL (ref 0.0–0.5)
Eosinophils Relative: 0 %
HCT: 42.1 % (ref 39.0–52.0)
Hemoglobin: 13 g/dL (ref 13.0–17.0)
Immature Granulocytes: 1 %
Lymphocytes Relative: 18 %
Lymphs Abs: 1.4 10*3/uL (ref 0.7–4.0)
MCH: 28.6 pg (ref 26.0–34.0)
MCHC: 30.9 g/dL (ref 30.0–36.0)
MCV: 92.7 fL (ref 80.0–100.0)
Monocytes Absolute: 0.5 10*3/uL (ref 0.1–1.0)
Monocytes Relative: 6 %
Neutro Abs: 5.7 10*3/uL (ref 1.7–7.7)
Neutrophils Relative %: 75 %
Platelets: 287 10*3/uL (ref 150–400)
RBC: 4.54 MIL/uL (ref 4.22–5.81)
RDW: 14.6 % (ref 11.5–15.5)
WBC: 7.7 10*3/uL (ref 4.0–10.5)
nRBC: 0 % (ref 0.0–0.2)

## 2022-08-16 LAB — MAGNESIUM: Magnesium: 2.5 mg/dL — ABNORMAL HIGH (ref 1.7–2.4)

## 2022-08-16 MED ORDER — PREDNISONE 20 MG PO TABS
40.0000 mg | ORAL_TABLET | Freq: Every day | ORAL | 0 refills | Status: AC
  Filled 2022-08-16: qty 8, 4d supply, fill #0

## 2022-08-16 MED ORDER — BENZONATATE 100 MG PO CAPS
100.0000 mg | ORAL_CAPSULE | Freq: Three times a day (TID) | ORAL | 0 refills | Status: DC | PRN
  Filled 2022-08-16 (×2): qty 30, 10d supply, fill #0

## 2022-08-16 MED ORDER — ALBUTEROL SULFATE HFA 108 (90 BASE) MCG/ACT IN AERS
2.0000 | INHALATION_SPRAY | RESPIRATORY_TRACT | 0 refills | Status: DC | PRN
  Filled 2022-08-16: qty 6.7, 16d supply, fill #0
  Filled 2022-08-16: qty 6.7, 30d supply, fill #0

## 2022-08-16 NOTE — Discharge Instructions (Signed)
Please follow up with your primary care provider in 1-2 weeks Please try to stay 6 feet away from members of your household until you are symptom free.  If you must be in their vicinity wear a mask You may return to work but do not engage in strenuous activity for 3 days.  Return to the ED if you develop worsening shortness of breath, chest pain, weakness or fevers in excess of 100.26F Take all prescribed medications exactly as instructed.

## 2022-08-16 NOTE — Discharge Summary (Signed)
Physician Discharge Summary   Patient: Joshua Parrish MRN: [id number] DOB: 1976-06-11  Admit date:     08/14/2022  Discharge date: 08/16/22  Discharge Physician: Vernelle Emerald   PCP: Marrian Salvage, FNP   Recommendations at discharge:   Patient was instructed to follow up closely with his primary care provider Patient was provided with an albuterol metered-dose inhaler, was educated on its use and instructed to use it as needed for further bouts of wheezing. Patient was provided an additional 4-day course of prednisone Patient was instructed to avoid strenuous activity for an additional 3 days Patient is a to return the emergency department if you develop worsening shortness of breath chest pain weakness or fevers greater than 100.5 F.  Discharge Diagnoses: Principal Problem:   RSV bronchitis Active Problems:   Essential hypertension   OSA on CPAP   GERD without esophagitis  Resolved Problems:   * No resolved hospital problems. *   Hospital Course: 46 year old male with past medical history of hypertension, obesity, anxiety disorder, depression who presented to [facility] emergency department with complaints of shortness of breath wheezing and cough.  Upon evaluation in the emergency department patient was found to be intermittently hypoxic with attempted ambulation and extremely short of breath.  Initial workup revealed RSV PCR testing was positive.  Due to substantial shortness of breath and intermittent hypoxia patient initiated on bronchodilator therapy and systemic steroids and the hospice group was then called to assess the patient for admission to the hospital.  Patient was then accepted to [facility] long hospital for continued medical care.  In the days that followed patient was continued on aggressive bronchodilator therapy as well as systemic steroids for substantial wheezing.  Supportive care was provided and patient gradually improved over the next  several days.  As patient clinically improved, oxygen was weaned and discontinued.  Patient was discharged in improved and stable condition on 12/16.  Patient was discharged home on an additional 4-day burst of oral prednisone as well as an albuterol inhaler.  Patient was instructed to avoid strenuous activity for an additional 3 days upon returning to work.     Consultants: None Procedures performed: None  Disposition: Home Diet recommendation:  Cardiac diet  DISCHARGE MEDICATION: Allergies as of 08/16/2022       Reactions   Silicone Rash   Dermabond   Coconut Oil Diarrhea, Nausea And Vomiting, Other (See Comments)   Allergic to coconuts   Lisinopril Cough        Medication List     STOP taking these medications    AMBULATORY NON FORMULARY MEDICATION   azithromycin 250 MG tablet Commonly known as: Zithromax Z-Pak   fluticasone 50 MCG/ACT nasal spray Commonly known as: FLONASE   HYDROcodone bit-homatropine 5-1.5 MG/5ML syrup Commonly known as: HYCODAN   nitrofurantoin (macrocrystal-monohydrate) 100 MG capsule Commonly known as: MACROBID   VITAMIN D3 PO       TAKE these medications    albuterol 108 (90 Base) MCG/ACT inhaler Commonly known as: VENTOLIN HFA Inhale 2 puffs into the lungs every 4 (four) hours as needed for wheezing or shortness of breath. What changed: when to take this   allopurinol 300 MG tablet Commonly known as: ZYLOPRIM Take 2 tablets (600 mg total) by mouth daily.   amLODipine 10 MG tablet Commonly known as: NORVASC TAKE 1 TABLET BY MOUTH DAILY   anastrozole 1 MG tablet Commonly known as: ARIMIDEX Take 1 mg by mouth every Monday, Wednesday, and Friday.  benzonatate 100 MG capsule Commonly known as: TESSALON Take 1 capsule (100 mg total) by mouth 3 (three) times daily as needed for cough.   buPROPion 150 MG 24 hr tablet Commonly known as: WELLBUTRIN XL TAKE ONE TABLET BY MOUTH DAILY   colchicine 0.6 MG tablet Take 1  tablet (0.6 mg total) by mouth daily.   escitalopram 20 MG tablet Commonly known as: LEXAPRO Take 1 tablet (20 mg total) by mouth daily.   gabapentin 300 MG capsule Commonly known as: NEURONTIN Take 2 capsules (600 mg total) by mouth 3 (three) times daily. What changed:  when to take this reasons to take this   lamoTRIgine 100 MG tablet Commonly known as: LaMICtal Take 1 tablet (100 mg total) by mouth 2 (two) times daily.   loratadine 10 MG tablet Commonly known as: CLARITIN Take 10 mg by mouth daily as needed for allergies or rhinitis.   omeprazole 40 MG capsule Commonly known as: PRILOSEC Take 1 capsule (40 mg total) by mouth daily before breakfast.   predniSONE 20 MG tablet Commonly known as: DELTASONE Take 2 tablets (40 mg total) by mouth daily for 4 days. What changed:  how much to take when to take this   tadalafil 20 MG tablet Commonly known as: CIALIS Take 20 mg by mouth in the morning.   testosterone cypionate 200 MG/ML injection Commonly known as: DEPOTESTOSTERONE CYPIONATE Inject 100 mg into the muscle every Thursday.   valsartan-hydrochlorothiazide 320-25 MG tablet Commonly known as: DIOVAN-HCT TAKE ONE TABLET BY MOUTH DAILY        Follow-up Information     Marrian Salvage, FNP. Go in 1 week(s).   Specialty: Internal Medicine Contact information: [address] [address] [phone]                 Discharge Exam: Danley Danker Weights   08/14/22 1401  Weight: 136.1 kg   Constitutional: Awake alert and oriented x3, no associated distress.   Respiratory: Minimal expiratory wheezing heard in all fields with no evidence of rales.  Normal respiratory effort. No accessory muscle use.  Cardiovascular: Regular rate and rhythm, no murmurs / rubs / gallops. No extremity edema. 2+ pedal pulses. No carotid bruits.  Abdomen: Abdomen is soft and nontender.  No evidence of intra-abdominal masses.  Positive bowel  sounds noted in all quadrants.   Musculoskeletal: No joint deformity upper and lower extremities. Good ROM, no contractures. Normal muscle tone.     Condition at discharge: fair  The results of significant diagnostics from this hospitalization (including imaging, microbiology, ancillary and laboratory) are listed below for reference.   Imaging Studies: DG Chest 2 View  Result Date: 08/14/2022 CLINICAL DATA:  Short of breath for 4-5 days EXAM: CHEST - 2 VIEW COMPARISON:  10/26/2020 FINDINGS: The heart size and mediastinal contours are within normal limits. Both lungs are clear. The visualized skeletal structures are unremarkable. IMPRESSION: No active cardiopulmonary disease. Electronically Signed   By: Randa Ngo M.D.   On: 08/14/2022 14:53    Microbiology: Results for orders placed or performed during the hospital encounter of 08/14/22  Resp panel by RT-PCR (RSV, Flu A&B, Covid) Anterior Nasal Swab     Status: Abnormal   Collection Time: 08/14/22  3:11 PM   Specimen: Anterior Nasal Swab  Result Value Ref Range Status   SARS Coronavirus 2 by RT PCR NEGATIVE NEGATIVE Final    Comment: (NOTE) SARS-CoV-2 target nucleic acids are NOT DETECTED.  The  SARS-CoV-2 RNA is generally detectable in upper respiratory specimens during the acute phase of infection. The lowest concentration of SARS-CoV-2 viral copies this assay can detect is 138 copies/mL. A negative result does not preclude SARS-Cov-2 infection and should not be used as the sole basis for treatment or other patient management decisions. A negative result may occur with  improper specimen collection/handling, submission of specimen other than nasopharyngeal swab, presence of viral mutation(s) within the areas targeted by this assay, and inadequate number of viral copies(<138 copies/mL). A negative result must be combined with clinical observations, patient history, and epidemiological information. The expected result is  Negative.  Fact Sheet for Patients:  EntrepreneurPulse.com.au  Fact Sheet for Healthcare Providers:  IncredibleEmployment.be  This test is no t yet approved or cleared by the [place] FDA and  has been authorized for detection and/or diagnosis of SARS-CoV-2 by FDA under an Emergency Use Authorization (EUA). This EUA will remain  in effect (meaning this test can be used) for the duration of the COVID-19 declaration under Section 564(b)(1) of the Act, 21 U.S.C.section 360bbb-3(b)(1), unless the authorization is terminated  or revoked sooner.       Influenza A by PCR NEGATIVE NEGATIVE Final   Influenza B by PCR NEGATIVE NEGATIVE Final    Comment: (NOTE) The Xpert Xpress SARS-CoV-2/FLU/RSV plus assay is intended as an aid in the diagnosis of influenza from Nasopharyngeal swab specimens and should not be used as a sole basis for treatment. Nasal washings and aspirates are unacceptable for Xpert Xpress SARS-CoV-2/FLU/RSV testing.  Fact Sheet for Patients: EntrepreneurPulse.com.au  Fact Sheet for Healthcare Providers: IncredibleEmployment.be  This test is not yet approved or cleared by the [place] FDA and has been authorized for detection and/or diagnosis of SARS-CoV-2 by FDA under an Emergency Use Authorization (EUA). This EUA will remain in effect (meaning this test can be used) for the duration of the COVID-19 declaration under Section 564(b)(1) of the Act, 21 U.S.C. section 360bbb-3(b)(1), unless the authorization is terminated or revoked.     Resp Syncytial Virus by PCR POSITIVE (A) NEGATIVE Final    Comment: (NOTE) Fact Sheet for Patients: EntrepreneurPulse.com.au  Fact Sheet for Healthcare Providers: IncredibleEmployment.be  This test is not yet approved or cleared by the [place] FDA and has been authorized for detection and/or diagnosis of  SARS-CoV-2 by FDA under an Emergency Use Authorization (EUA). This EUA will remain in effect (meaning this test can be used) for the duration of the COVID-19 declaration under Section 564(b)(1) of the Act, 21 U.S.C. section 360bbb-3(b)(1), unless the authorization is terminated or revoked.  Performed at [facility], [address] [place], [place] [address]     Labs: CBC: Recent Labs  Lab 08/14/22 1505 08/15/22 0619 08/16/22 0736  WBC 5.3 6.6 7.7  NEUTROABS 3.5  --  5.7  HGB 13.6 13.7 13.0  HCT 40.9 42.1 42.1  MCV 85.9 89.6 92.7  PLT 315 310 366   Basic Metabolic Panel: Recent Labs  Lab 08/14/22 1505 08/15/22 0619 08/16/22 0736  NA 133* 139 137  K 4.1 4.5 4.5  CL 99 102 104  CO2 '24 26 24  '$ GLUCOSE 114* 133* 134*  BUN 17 15 22*  CREATININE 1.00 0.73 0.89  CALCIUM 9.1 9.4 9.1  MG  --  2.5* 2.5*  PHOS  --  4.3  --    Liver Function Tests: Recent Labs  Lab 08/16/22 0736  AST 22  ALT 37  ALKPHOS 54  BILITOT  0.4  PROT 6.4*  ALBUMIN 3.9   CBG: No results for input(s): "GLUCAP" in the last 168 hours.  Discharge time spent: greater than 30 minutes.  Signed: Vernelle Emerald, MD [facility] 08/16/2022

## 2022-08-16 NOTE — Plan of Care (Signed)
Patient is adequate for discharge.  

## 2022-08-31 ENCOUNTER — Other Ambulatory Visit: Payer: Self-pay | Admitting: Family

## 2022-09-02 ENCOUNTER — Encounter: Payer: Self-pay | Admitting: Family

## 2022-09-07 ENCOUNTER — Other Ambulatory Visit: Payer: Self-pay | Admitting: Family

## 2022-09-12 ENCOUNTER — Other Ambulatory Visit (HOSPITAL_BASED_OUTPATIENT_CLINIC_OR_DEPARTMENT_OTHER): Payer: Self-pay

## 2022-09-12 ENCOUNTER — Other Ambulatory Visit (HOSPITAL_COMMUNITY): Payer: Self-pay

## 2022-09-23 ENCOUNTER — Telehealth: Payer: Self-pay | Admitting: Family

## 2022-09-23 NOTE — Telephone Encounter (Signed)
Patient aware he needs to make an appointment, he will make it through [facility] later today.

## 2022-09-23 NOTE — Telephone Encounter (Signed)
Ebony Hail, nurse case manager, was calling to advise provider that Joshua Parrish is not available and patient was wanting to switch to zepbound which is more available. She just wanted to make the provider aware as well as an alternative for patient.

## 2022-09-23 NOTE — Telephone Encounter (Signed)
Left message on machine to call back to schedule for OV or VV.

## 2022-09-26 ENCOUNTER — Ambulatory Visit: Payer: Managed Care, Other (non HMO) | Admitting: Family

## 2022-09-29 ENCOUNTER — Other Ambulatory Visit: Payer: Self-pay | Admitting: Neurology

## 2022-09-29 ENCOUNTER — Other Ambulatory Visit: Payer: Self-pay | Admitting: Family

## 2022-09-29 ENCOUNTER — Other Ambulatory Visit: Payer: Self-pay | Admitting: Nurse Practitioner

## 2022-09-30 ENCOUNTER — Emergency Department (HOSPITAL_BASED_OUTPATIENT_CLINIC_OR_DEPARTMENT_OTHER)
Admission: EM | Admit: 2022-09-30 | Discharge: 2022-10-01 | Disposition: A | Discharge status: Home / Self Care | Payer: Managed Care, Other (non HMO) | Attending: Emergency Medicine | Admitting: Emergency Medicine

## 2022-09-30 ENCOUNTER — Emergency Department (HOSPITAL_BASED_OUTPATIENT_CLINIC_OR_DEPARTMENT_OTHER): Payer: Managed Care, Other (non HMO)

## 2022-09-30 ENCOUNTER — Other Ambulatory Visit: Payer: Self-pay

## 2022-09-30 ENCOUNTER — Encounter (HOSPITAL_BASED_OUTPATIENT_CLINIC_OR_DEPARTMENT_OTHER): Payer: Self-pay

## 2022-09-30 DIAGNOSIS — I10 Essential (primary) hypertension: Secondary | ICD-10-CM | POA: Insufficient documentation

## 2022-09-30 DIAGNOSIS — R300 Dysuria: Secondary | ICD-10-CM | POA: Diagnosis not present

## 2022-09-30 DIAGNOSIS — J189 Pneumonia, unspecified organism: Secondary | ICD-10-CM | POA: Insufficient documentation

## 2022-09-30 DIAGNOSIS — R1084 Generalized abdominal pain: Secondary | ICD-10-CM | POA: Insufficient documentation

## 2022-09-30 DIAGNOSIS — Z79899 Other long term (current) drug therapy: Secondary | ICD-10-CM | POA: Insufficient documentation

## 2022-09-30 DIAGNOSIS — R059 Cough, unspecified: Secondary | ICD-10-CM | POA: Diagnosis present

## 2022-09-30 LAB — CBC
HCT: 40.7 % (ref 39.0–52.0)
Hemoglobin: 14 g/dL (ref 13.0–17.0)
MCH: 30.1 pg (ref 26.0–34.0)
MCHC: 34.4 g/dL (ref 30.0–36.0)
MCV: 87.5 fL (ref 80.0–100.0)
Platelets: 310 10*3/uL (ref 150–400)
RBC: 4.65 MIL/uL (ref 4.22–5.81)
RDW: 13.6 % (ref 11.5–15.5)
WBC: 9.3 10*3/uL (ref 4.0–10.5)
nRBC: 0 % (ref 0.0–0.2)

## 2022-09-30 LAB — BASIC METABOLIC PANEL
Anion gap: 11 (ref 5–15)
BUN: 15 mg/dL (ref 6–20)
CO2: 24 mmol/L (ref 22–32)
Calcium: 9.1 mg/dL (ref 8.9–10.3)
Chloride: 97 mmol/L — ABNORMAL LOW (ref 98–111)
Creatinine, Ser: 1.1 mg/dL (ref 0.61–1.24)
GFR, Estimated: >60 mL/min (ref >60)
Glucose, Bld: 128 mg/dL — ABNORMAL HIGH (ref 70–99)
Potassium: 3.7 mmol/L (ref 3.5–5.1)
Sodium: 132 mmol/L — ABNORMAL LOW (ref 135–145)

## 2022-09-30 LAB — URINALYSIS, W/ REFLEX TO CULTURE (INFECTION SUSPECTED)
Bilirubin Urine: NEGATIVE
Glucose, UA: NEGATIVE mg/dL
Ketones, ur: NEGATIVE mg/dL
Leukocytes,Ua: NEGATIVE
Nitrite: NEGATIVE
RBC / HPF: >50 RBC/hpf (ref 0–5)
Specific Gravity, Urine: 1.02 (ref 1.005–1.030)
pH: 6 (ref 5.0–8.0)

## 2022-09-30 MED ORDER — SODIUM CHLORIDE 0.9 % IV BOLUS
1000.0000 mL | Freq: Once | INTRAVENOUS | Status: AC
  Administered 2022-09-30: 1000 mL via INTRAVENOUS

## 2022-09-30 MED ORDER — IOHEXOL 300 MG/ML  SOLN
100.0000 mL | Freq: Once | INTRAMUSCULAR | Status: AC | PRN
  Administered 2022-09-30: 100 mL via INTRAVENOUS

## 2022-09-30 MED ORDER — MORPHINE SULFATE (PF) 4 MG/ML IV SOLN
4.0000 mg | Freq: Once | INTRAVENOUS | Status: AC
  Administered 2022-09-30: 4 mg via INTRAVENOUS
  Filled 2022-09-30: qty 1

## 2022-09-30 MED ORDER — SODIUM CHLORIDE 0.9 % IV SOLN
2.0000 g | Freq: Once | INTRAVENOUS | Status: AC
  Administered 2022-09-30: 2 g via INTRAVENOUS
  Filled 2022-09-30: qty 20

## 2022-09-30 NOTE — ED Provider Notes (Signed)
Joshua Parrish Note   CSN: [id number] Arrival date & time: 09/30/22  2202     History  Chief Complaint  Patient presents with   Back Pain    Joshua Parrish is a 47 y.o. male with history of anxiety, depression, fatty liver, GERD, hypertension, migraines, sleep apnea presents emergency department complaining of abdominal and back pain for the past 2 days.  Patient states symptoms feel the same as when he was previously diagnosed with prostatitis, and developed sepsis.  He has been having burning with urination and dark urine starting today, and fever prior to ER arrival of 102.20F. Hx of hernia repair surgery.   Patient also mentions he has had a cough for a day or so, thought this was associated with his fever.  Mostly nonproductive.  Was recently in the hospital for RSV pneumonia.   Back Pain Associated symptoms: dysuria and fever        Home Medications Prior to Admission medications   Medication Sig Start Date End Date Taking? Authorizing Parrish  amoxicillin-clavulanate (AUGMENTIN) 875-125 MG tablet Take 1 tablet by mouth every 12 (twelve) hours. 10/01/22  Yes Khamari Sheehan T, PA-C  azithromycin (ZITHROMAX) 250 MG tablet Take 1 tablet (250 mg total) by mouth daily. Take first 2 tablets together, then 1 every day until finished. 10/01/22  Yes Tashia Leiterman T, PA-C  albuterol (VENTOLIN HFA) 108 (90 Base) MCG/ACT inhaler Inhale 2 puffs into the lungs every 4 (four) hours as needed for wheezing or shortness of breath. 08/16/22   Shalhoub, Sherryll Burger, MD  allopurinol (ZYLOPRIM) 300 MG tablet Take 2 tablets (600 mg total) by mouth daily. 06/13/22   Gregor Hams, MD  amLODipine (NORVASC) 10 MG tablet TAKE 1 TABLET BY MOUTH DAILY 09/02/22   Marrian Salvage, FNP  anastrozole (ARIMIDEX) 1 MG tablet Take 1 mg by mouth every Monday, Wednesday, and Friday.    Parrish, Historical, MD  benzonatate (TESSALON) 100 MG capsule Take 1 capsule  (100 mg total) by mouth 3 (three) times daily as needed for cough. 08/16/22   Vernelle Emerald, MD  buPROPion (WELLBUTRIN XL) 150 MG 24 hr tablet TAKE 1 TABLET BY MOUTH DAILY 09/08/22   Shelda Pal, DO  colchicine 0.6 MG tablet Take 1 tablet (0.6 mg total) by mouth daily. 06/13/22   Gregor Hams, MD  escitalopram (LEXAPRO) 20 MG tablet Take 1 tablet (20 mg total) by mouth daily. 03/21/22   Marrian Salvage, FNP  gabapentin (NEURONTIN) 300 MG capsule Take 2 capsules (600 mg total) by mouth 3 (three) times daily. Patient taking differently: Take 600 mg by mouth 3 (three) times daily as needed (pain). 02/18/22   Lavina Hamman, MD  lamoTRIgine (LAMICTAL) 100 MG tablet Take 1 tablet (100 mg total) by mouth 2 (two) times daily. 01/09/22   Marcial Pacas, MD  loratadine (CLARITIN) 10 MG tablet Take 10 mg by mouth daily as needed for allergies or rhinitis.    Parrish, Historical, MD  omeprazole (PRILOSEC) 40 MG capsule TAKE ONE CAPSULE BY MOUTH DAILY 09/29/22   Marrian Salvage, FNP  tadalafil (CIALIS) 20 MG tablet Take 20 mg by mouth in the morning.    Parrish, Historical, MD  testosterone cypionate (DEPOTESTOSTERONE CYPIONATE) 200 MG/ML injection Inject 100 mg into the muscle every Thursday. 03/07/22   Parrish, Historical, MD  valsartan-hydrochlorothiazide (DIOVAN-HCT) 320-25 MG tablet TAKE 1 TABLET BY MOUTH DAILY 09/02/22   Marrian Salvage, FNP  Allergies    Silicone, Coconut oil, and Lisinopril    Review of Systems   Review of Systems  Constitutional:  Positive for chills and fever.  Genitourinary:  Positive for decreased urine volume, dysuria, flank pain and hematuria.  Musculoskeletal:  Positive for back pain.  All other systems reviewed and are negative.   Physical Exam Updated Vital Signs BP 120/68   Pulse (!) 102   Temp 99.5 F (37.5 C) (Oral)   Resp 15   Ht '5\' 9"'$  (1.753 m)   Wt (!) 136.1 kg   SpO2 95%   BMI 44.31 kg/m  Physical Exam Vitals and  nursing note reviewed.  Constitutional:      Appearance: Normal appearance.     Comments: Appears uncomfortable  HENT:     Head: Normocephalic and atraumatic.  Eyes:     Conjunctiva/sclera: Conjunctivae normal.  Cardiovascular:     Rate and Rhythm: Normal rate and regular rhythm.  Pulmonary:     Effort: Pulmonary effort is normal. No respiratory distress.     Breath sounds: Normal breath sounds.  Abdominal:     General: There is no distension.     Palpations: Abdomen is soft.     Tenderness: There is generalized abdominal tenderness. There is no right CVA tenderness, left CVA tenderness, guarding or rebound.  Genitourinary:    Comments: Rectal/prostate exam deferred Skin:    General: Skin is warm and dry.  Neurological:     General: No focal deficit present.     Mental Status: He is alert.     ED Results / Procedures / Treatments   Labs (all labs ordered are listed, but only abnormal results are displayed) Labs Reviewed  BASIC METABOLIC PANEL - Abnormal; Notable for the following components:      Result Value   Sodium 132 (*)    Chloride 97 (*)    Glucose, Bld 128 (*)    All other components within normal limits  URINALYSIS, W/ REFLEX TO CULTURE (INFECTION SUSPECTED) - Abnormal; Notable for the following components:   Hgb urine dipstick LARGE (*)    Protein, ur TRACE (*)    Bacteria, UA FEW (*)    All other components within normal limits  CBC    EKG None  Radiology CT ABDOMEN PELVIS W CONTRAST  Result Date: 09/30/2022 CLINICAL DATA:  Acute nonlocalized abdominal pain. Back pain. Fever. EXAM: CT ABDOMEN AND PELVIS WITH CONTRAST TECHNIQUE: Multidetector CT imaging of the abdomen and pelvis was performed using the standard protocol following bolus administration of intravenous contrast. RADIATION DOSE REDUCTION: This exam was performed according to the departmental dose-optimization program which includes automated exposure control, adjustment of the mA and/or kV  according to patient size and/or use of iterative reconstruction technique. CONTRAST:  169m OMNIPAQUE IOHEXOL 300 MG/ML  SOLN COMPARISON:  04/08/2022 FINDINGS: Lower chest: Nodular airspace disease in the left lower lobe is typical of pneumonia. No pleural fluid. Hepatobiliary: The liver is enlarged spanning 21.5 cm cranial caudal. Diffuse hepatic steatosis. More focal fatty infiltration adjacent to the falciform ligament. Clips in the gallbladder fossa postcholecystectomy. No biliary dilatation. Pancreas: Unremarkable. No pancreatic ductal dilatation or surrounding inflammatory changes. Spleen: Enlarged measuring 13.6 x 13.2 x 8.9 cm (volume = 840 cm^3). Adrenals/Urinary Tract: Normal adrenal glands. No hydronephrosis, renal calculi or suspicious renal lesion. Unremarkable urinary bladder Stomach/Bowel: Mild gastric distension with ingested contents. No bowel obstruction or inflammation. Prior appendectomy. Small to moderate volume of stool in the colon. Vascular/Lymphatic: No acute vascular  findings. Normal caliber abdominal aorta. Mild atherosclerosis. Patent portal vein. No abdominopelvic adenopathy. Reproductive: Prostate is unremarkable. Other: No free air, free fluid, or intra-abdominal fluid collection. Musculoskeletal: L2-L3 degenerative disc disease with vacuum phenomenon. There are no acute or suspicious osseous abnormalities. IMPRESSION: 1. No acute abnormality in the abdomen/pelvis. 2. Nodular airspace disease in the left lower lobe in the lower thorax is typical of pneumonia. 3. Hepatosplenomegaly and hepatic steatosis. Electronically Signed   By: Keith Rake M.D.   On: 09/30/2022 23:32    Procedures Procedures    Medications Ordered in ED Medications  morphine (PF) 4 MG/ML injection 4 mg (4 mg Intravenous Given 09/30/22 2339)  iohexol (OMNIPAQUE) 300 MG/ML solution 100 mL (100 mLs Intravenous Contrast Given 09/30/22 2314)  sodium chloride 0.9 % bolus 1,000 mL (0 mLs Intravenous Stopped  10/01/22 0100)  cefTRIAXone (ROCEPHIN) 2 g in sodium chloride 0.9 % 100 mL IVPB (0 g Intravenous Stopped 10/01/22 0101)    ED Course/ Medical Decision Making/ A&P Clinical Course as of 10/01/22 1513  Tue Sep 30, 2022  2336 Pyelonephritis. Ceftriaxone, IVF, PO [CC]    Clinical Course User Index [CC] Tretha Sciara, MD                             Medical Decision Making Amount and/or Complexity of Data Reviewed Labs: ordered. Radiology: ordered.  Risk Prescription drug management.  This patient is a 47 y.o. male  who presents to the ED for concern of back pain and dysuria.   Differential diagnoses prior to evaluation: The emergent differential diagnosis includes, but is not limited to,  UTI, pyelonephritis, nephrolithiasis, prostatitis, STD. This is not an exhaustive differential.   Past Medical History / Co-morbidities: anxiety, depression, fatty liver, GERD, hypertension, migraines, sleep apnea, inguinal hernia repair  Additional history: Chart reviewed. Pertinent results include: Records from 2018 where patient had some similar symptoms and was diagnosed with prostatitis versus pyelonephritis at the time.  Do not see any evidence of definitive testing, however he was treated with antibiotics to cover most likely organisms of both infectious etiology and seem to have improved.  Physical Exam: Physical exam performed. The pertinent findings include: Patient appears uncomfortable.  Low-grade fever here of 100.3 F, and mildly tachycardic, improved after fluids and pain medication.  Generalized abdominal tenderness, with no focal tenderness.  No CVA tenderness.  GU exam deferred.  Lab Tests/Imaging studies: I personally interpreted labs/imaging and the pertinent results include: No leukocytosis.  Sodium 132, chloride 97, mildly elevated glucose.  Kidney function normal.  Urinalysis with large hemoglobin, negative nitrates and leukocytes, less than 10 WBCs, and only few bacteria.   Not convincing for UTI.  CT abdomen pelvis unremarkable for findings in the abdomen, but did show some evidence of pneumonia in the left lower lobe of the lung.  I agree with the radiologist interpretation.  Medications: I ordered medication including IV fluids, analgesics, Rocephin.  I have reviewed the patients home medicines and have made adjustments as needed.   Disposition: After consideration of the diagnostic results and the patients response to treatment, I feel that emergency department workup does not suggest an emergent condition requiring admission or immediate intervention beyond what has been performed at this time. The plan is: Discharged home with antibiotics to cover for community-acquired pneumonia.  Patient is experiencing some symptoms of what could be prostatitis or early UTI.  Not evident on his urinalysis, and does not really meet  criteria for culture at this time.  However given Rocephin to cover for pneumonia as well as possible prostatitis from E. coli or STDs.  Will be discharged on Augmentin and azithromycin.  Encourage close follow-up with his PCP.  Not requiring oxygen and no increased respiratory effort, so think that he safe to be treated as an outpatient. The patient is safe for discharge and has been instructed to return immediately for worsening symptoms, change in symptoms or any other concerns.  Final Clinical Impression(s) / ED Diagnoses Final diagnoses:  Dysuria  Community acquired pneumonia of left lower lobe of lung    Rx / DC Orders ED Discharge Orders          Ordered    amoxicillin-clavulanate (AUGMENTIN) 875-125 MG tablet  Every 12 hours        10/01/22 0007    azithromycin (ZITHROMAX) 250 MG tablet  Daily        10/01/22 0007           Portions of this report may have been transcribed using voice recognition software. Every effort was made to ensure accuracy; however, inadvertent computerized transcription errors may be present.     Estill Cotta 10/01/22 1513    Tretha Sciara, MD 10/03/22 [phone]

## 2022-09-30 NOTE — ED Notes (Signed)
Patient transported to CT 

## 2022-09-30 NOTE — ED Provider Notes (Incomplete)
[facility] Provider Note   CSN: [id number] Arrival date & time: 09/30/22  2202     History {Add pertinent medical, surgical, social history, OB history to HPI:1} Chief Complaint  Patient presents with  . Back Pain    Joshua Parrish is a 47 y.o. male with history of anxiety, depression, fatty liver, GERD, hypertension, migraines, sleep apnea presents emergency department complaining of abdominal and back pain for the past 2 days.  Patient states symptoms feel the same as when he was previously diagnosed with prostatitis, and developed sepsis.  He has been having burning with urination and dark urine starting today, and fever prior to ER arrival of 102.33F. Hx of hernia repair surgery.    Back Pain      Home Medications Prior to Admission medications   Medication Sig Start Date End Date Taking? Authorizing Provider  albuterol (VENTOLIN HFA) 108 (90 Base) MCG/ACT inhaler Inhale 2 puffs into the lungs every 4 (four) hours as needed for wheezing or shortness of breath. 08/16/22   Shalhoub, Sherryll Burger, MD  allopurinol (ZYLOPRIM) 300 MG tablet Take 2 tablets (600 mg total) by mouth daily. 06/13/22   Gregor Hams, MD  amLODipine (NORVASC) 10 MG tablet TAKE 1 TABLET BY MOUTH DAILY 09/02/22   Marrian Salvage, FNP  anastrozole (ARIMIDEX) 1 MG tablet Take 1 mg by mouth every Monday, Wednesday, and Friday.    [provider]  benzonatate (TESSALON) 100 MG capsule Take 1 capsule (100 mg total) by mouth 3 (three) times daily as needed for cough. 08/16/22   Vernelle Emerald, MD  buPROPion (WELLBUTRIN XL) 150 MG 24 hr tablet TAKE 1 TABLET BY MOUTH DAILY 09/08/22   Shelda Pal, DO  colchicine 0.6 MG tablet Take 1 tablet (0.6 mg total) by mouth daily. 06/13/22   Gregor Hams, MD  escitalopram (LEXAPRO) 20 MG tablet Take 1 tablet (20 mg total) by mouth daily. 03/21/22   Marrian Salvage, FNP  gabapentin (NEURONTIN) 300 MG capsule  Take 2 capsules (600 mg total) by mouth 3 (three) times daily. Patient taking differently: Take 600 mg by mouth 3 (three) times daily as needed (pain). 02/18/22   Lavina Hamman, MD  lamoTRIgine (LAMICTAL) 100 MG tablet Take 1 tablet (100 mg total) by mouth 2 (two) times daily. 01/09/22   Marcial Pacas, MD  loratadine (CLARITIN) 10 MG tablet Take 10 mg by mouth daily as needed for allergies or rhinitis.    [provider]  omeprazole (PRILOSEC) 40 MG capsule TAKE ONE CAPSULE BY MOUTH DAILY 09/29/22   Marrian Salvage, FNP  tadalafil (CIALIS) 20 MG tablet Take 20 mg by mouth in the morning.    [provider]  testosterone cypionate (DEPOTESTOSTERONE CYPIONATE) 200 MG/ML injection Inject 100 mg into the muscle every Thursday. 03/07/22   [provider]  valsartan-hydrochlorothiazide (DIOVAN-HCT) 320-25 MG tablet TAKE 1 TABLET BY MOUTH DAILY 09/02/22   Marrian Salvage, FNP      Allergies    Silicone, Coconut oil, and Lisinopril    Review of Systems   Review of Systems  Musculoskeletal:  Positive for back pain.    Physical Exam Updated Vital Signs BP (!) 162/107 (BP Location: Right Arm)   Pulse (!) 120   Temp 100.3 F (37.9 C) (Oral)   Resp 20   Ht '5\' 9"'$  (1.753 m)   Wt (!) 136.1 kg   SpO2 97%   BMI 44.31 kg/m  Physical Exam  ED Results / Procedures / Treatments   Labs (all labs ordered are listed, but only abnormal results are displayed) Labs Reviewed  BASIC METABOLIC PANEL - Abnormal; Notable for the following components:      Result Value   Sodium 132 (*)    Chloride 97 (*)    Glucose, Bld 128 (*)    All other components within normal limits  URINALYSIS, W/ REFLEX TO CULTURE (INFECTION SUSPECTED) - Abnormal; Notable for the following components:   Hgb urine dipstick LARGE (*)    Protein, ur TRACE (*)    Bacteria, UA FEW (*)    All other components within normal limits  CBC    EKG None  Radiology No results  found.  Procedures Procedures  {Document cardiac monitor, telemetry assessment procedure when appropriate:1}  Medications Ordered in ED Medications  morphine (PF) 4 MG/ML injection 4 mg (has no administration in time range)  iohexol (OMNIPAQUE) 300 MG/ML solution 100 mL (100 mLs Intravenous Contrast Given 09/30/22 2314)    ED Course/ Medical Decision Making/ A&P   {   Click here for ABCD2, HEART and other calculatorsREFRESH Note before signing :1}                          Medical Decision Making Amount and/or Complexity of Data Reviewed Labs: ordered. Radiology: ordered.  Risk Prescription drug management.   ***  {Document critical care time when appropriate:1} {Document review of labs and clinical decision tools ie heart score, Chads2Vasc2 etc:1}  {Document your independent review of radiology images, and any outside records:1} {Document your discussion with family members, caretakers, and with consultants:1} {Document social determinants of health affecting pt's care:1} {Document your decision making why or why not admission, treatments were needed:1} Final Clinical Impression(s) / ED Diagnoses Final diagnoses:  None    Rx / DC Orders ED Discharge Orders     None

## 2022-09-30 NOTE — ED Triage Notes (Signed)
Patient here POV from Home.  Endorses Lower Back Pain that began at 0800 today. Associated with Darker Urine and Dysuria later in the evening and a fever of 102.5.  NAD Noted during Triage. A&Ox4. GCS 15. Ambulatory.

## 2022-09-30 NOTE — ED Notes (Signed)
Back pain that started at 8am  Claims dysuria and urine being dark IV started and labs and urine sent

## 2022-10-01 MED ORDER — AMOXICILLIN-POT CLAVULANATE 875-125 MG PO TABS: 1.0000 | ORAL_TABLET | Freq: Two times a day (BID) | ORAL | 0 refills | Status: DC

## 2022-10-01 MED ORDER — AZITHROMYCIN 250 MG PO TABS: 250.0000 mg | ORAL_TABLET | Freq: Every day | ORAL | 0 refills | Status: DC

## 2022-10-01 NOTE — Discharge Instructions (Addendum)
You were seen in the emergency department today for back pain and urinary discomfort.  Your urine sample did not show obvious infection at this time, but we did see evidence of pneumonia in your left lower lung on your CT scan.  We have given you antibiotics that would treat both prostatitis and pneumonia.  I am sending you home with an additional course of antibiotics.  You can continue taking ibuprofen or Tylenol as needed for pain or fever.  Continue to monitor how you are doing and return to the emergency department for any new or worsening symptoms such as fever despite medication, worsening pain, inability to urinate.

## 2022-10-01 NOTE — ED Notes (Signed)
Reviewed AVS/discharge instruction with patient. Time allotted for and all questions answered. Patient is agreeable for d/c and escorted to ed exit by staff.  

## 2022-10-03 ENCOUNTER — Telehealth: Payer: Self-pay

## 2022-10-03 NOTE — Telephone Encounter (Signed)
Transition Care Management Unsuccessful Follow-up Telephone Call  Date of discharge and from where:  [facility], 10/01/2022  Attempts:  1st Attempt  Reason for unsuccessful TCM follow-up call:  No answer/busy

## 2022-10-06 NOTE — Telephone Encounter (Signed)
Transition Care Management Unsuccessful Follow-up Telephone Call  Date of discharge and from where:  [facility] 10/01/2022  Attempts:  2nd Attempt  Reason for unsuccessful TCM follow-up call:  No answer/busy

## 2022-10-07 NOTE — Telephone Encounter (Signed)
Transition Care Management Unsuccessful Follow-up Telephone Call  Date of discharge and from where:  [facility] 10/01/2022  Attempts:  3rd Attempt  Reason for unsuccessful TCM follow-up call:  No answer/busy

## 2022-10-08 ENCOUNTER — Ambulatory Visit: Payer: 59 | Admitting: Family Medicine

## 2022-10-14 ENCOUNTER — Ambulatory Visit (HOSPITAL_BASED_OUTPATIENT_CLINIC_OR_DEPARTMENT_OTHER)
Admission: RE | Admit: 2022-10-14 | Discharge: 2022-10-14 | Disposition: A | Discharge status: Home / Self Care | Payer: Managed Care, Other (non HMO) | Source: Ambulatory Visit | Attending: Family | Admitting: Family

## 2022-10-14 ENCOUNTER — Telehealth: Payer: Self-pay

## 2022-10-14 ENCOUNTER — Ambulatory Visit (INDEPENDENT_AMBULATORY_CARE_PROVIDER_SITE_OTHER): Payer: Managed Care, Other (non HMO) | Admitting: Family

## 2022-10-14 ENCOUNTER — Encounter: Payer: Self-pay | Admitting: Family

## 2022-10-14 VITALS — BP 116/72 | HR 64 | Resp 17 | Ht 69.0 in | Wt 311.4 lb

## 2022-10-14 DIAGNOSIS — Z6841 Body Mass Index (BMI) 40.0 and over, adult: Secondary | ICD-10-CM

## 2022-10-14 DIAGNOSIS — R300 Dysuria: Secondary | ICD-10-CM | POA: Diagnosis not present

## 2022-10-14 DIAGNOSIS — J189 Pneumonia, unspecified organism: Secondary | ICD-10-CM | POA: Diagnosis present

## 2022-10-14 DIAGNOSIS — R7309 Other abnormal glucose: Secondary | ICD-10-CM

## 2022-10-14 LAB — CBC WITH DIFFERENTIAL/PLATELET
Basophils Absolute: 0 10*3/uL (ref 0.0–0.1)
Basophils Relative: 0.4 % (ref 0.0–3.0)
Eosinophils Absolute: 0.1 10*3/uL (ref 0.0–0.7)
Eosinophils Relative: 1.6 % (ref 0.0–5.0)
HCT: 40 % (ref 39.0–52.0)
Hemoglobin: 13.3 g/dL (ref 13.0–17.0)
Lymphocytes Relative: 33.3 % (ref 12.0–46.0)
Lymphs Abs: 1.9 10*3/uL (ref 0.7–4.0)
MCHC: 33.2 g/dL (ref 30.0–36.0)
MCV: 88.2 fl (ref 78.0–100.0)
Monocytes Absolute: 0.4 10*3/uL (ref 0.1–1.0)
Monocytes Relative: 7 % (ref 3.0–12.0)
Neutro Abs: 3.3 10*3/uL (ref 1.4–7.7)
Neutrophils Relative %: 57.7 % (ref 43.0–77.0)
Platelets: 335 10*3/uL (ref 150.0–400.0)
RBC: 4.54 Mil/uL (ref 4.22–5.81)
RDW: 14 % (ref 11.5–15.5)
WBC: 5.7 10*3/uL (ref 4.0–10.5)

## 2022-10-14 LAB — PSA: PSA: 0.52 ng/mL (ref 0.10–4.00)

## 2022-10-14 LAB — HEMOGLOBIN A1C: Hgb A1c MFr Bld: 5.4 % (ref 4.6–6.5)

## 2022-10-14 MED ORDER — ZEPBOUND 2.5 MG/0.5ML ~~LOC~~ SOAJ: 2.5000 mg | SUBCUTANEOUS | 0 refills | Status: DC

## 2022-10-14 NOTE — Telephone Encounter (Signed)
PA initiated via Covermymeds; KEY: [id number]. Awaiting determination.

## 2022-10-14 NOTE — Progress Notes (Signed)
Joshua Parrish is a 47 y.o. male with the following history as recorded in EpicCare:  Patient Active Problem List   Diagnosis Date Noted   RSV bronchitis 08/14/2022   Chronic gout 06/13/2022   Abdominal pain 02/16/2022   Gait abnormality 01/09/2022   Morbid obesity with BMI of 45.0-49.9, adult (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   Low back pain 01/09/2022   Rotator cuff disorder, left 12/25/2017   Bilateral shoulder pain 05/22/2017   Left knee pain 05/22/2017   Urinary frequency 02/09/2017   Essential hypertension 01/16/2017   Prostatitis 12/25/2016   Sepsis ([place]) 12/25/2016   GERD without esophagitis 12/25/2016   Sinusitis 12/09/2016   Tendinitis of right rotator cuff 12/09/2016   Laryngopharyngeal reflux (LPR) 02/04/2016   Mass in neck 11/27/2015   Rotator cuff impingement syndrome of left shoulder 06/19/2015   Acromioclavicular joint arthritis 06/19/2015    Current Outpatient Medications  Medication Sig Dispense Refill   albuterol (VENTOLIN HFA) 108 (90 Base) MCG/ACT inhaler Inhale 2 puffs into the lungs every 4 (four) hours as needed for wheezing or shortness of breath. 6.7 g 0   allopurinol (ZYLOPRIM) 300 MG tablet Take 2 tablets (600 mg total) by mouth daily. 180 tablet 3   amLODipine (NORVASC) 10 MG tablet TAKE 1 TABLET BY MOUTH DAILY 30 tablet 3   amoxicillin-clavulanate (AUGMENTIN) 875-125 MG tablet Take 1 tablet by mouth every 12 (twelve) hours. 14 tablet 0   anastrozole (ARIMIDEX) 1 MG tablet Take 1 mg by mouth every Monday, Wednesday, and Friday.     azithromycin (ZITHROMAX) 250 MG tablet Take 1 tablet (250 mg total) by mouth daily. Take first 2 tablets together, then 1 every day until finished. 6 tablet 0   benzonatate (TESSALON) 100 MG capsule Take 1 capsule (100 mg total) by mouth 3 (three) times daily as needed for cough. 30 capsule 0   buPROPion (WELLBUTRIN XL) 150 MG 24 hr tablet TAKE 1 TABLET BY MOUTH DAILY 90 tablet 1   colchicine 0.6  MG tablet Take 1 tablet (0.6 mg total) by mouth daily. 60 tablet 1   escitalopram (LEXAPRO) 20 MG tablet Take 1 tablet (20 mg total) by mouth daily. 90 tablet 3   gabapentin (NEURONTIN) 300 MG capsule Take 2 capsules (600 mg total) by mouth 3 (three) times daily. (Patient taking differently: Take 600 mg by mouth 3 (three) times daily as needed (pain).) 90 capsule 0   lamoTRIgine (LAMICTAL) 100 MG tablet Take 1 tablet (100 mg total) by mouth 2 (two) times daily. 60 tablet 11   loratadine (CLARITIN) 10 MG tablet Take 10 mg by mouth daily as needed for allergies or rhinitis.     omeprazole (PRILOSEC) 40 MG capsule TAKE ONE CAPSULE BY MOUTH DAILY 90 capsule 3   tadalafil (CIALIS) 20 MG tablet Take 20 mg by mouth in the morning.     testosterone cypionate (DEPOTESTOSTERONE CYPIONATE) 200 MG/ML injection Inject 100 mg into the muscle every Thursday.     tirzepatide (ZEPBOUND) 2.5 MG/0.5ML Pen Inject 2.5 mg into the skin once a week. 2 mL 0   valsartan-hydrochlorothiazide (DIOVAN-HCT) 320-25 MG tablet TAKE 1 TABLET BY MOUTH DAILY 30 tablet 3   No current facility-administered medications for this visit.    Allergies: Silicone, Coconut oil, and Lisinopril  Past Medical History:  Diagnosis Date   Anxiety    Chicken pox    Depression    Fatty liver    GERD (gastroesophageal reflux  disease)    H/O hernia repair 03/31/2022   Hypertension    Migraines    Osteoarthritis of shoulder    left   Rotator cuff impingement syndrome of left shoulder 06/19/2015   Sinusitis    Sleep apnea     Past Surgical History:  Procedure Laterality Date   APPENDECTOMY     CERVICAL LAMINECTOMY     has plates and fusion   CHOLECYSTECTOMY N/A 12/24/2015   Procedure: LAPAROSCOPIC CHOLECYSTECTOMY ;  Surgeon: [name] Pickerel, MD;  Location: [facility];  Service: General;  Laterality: N/A;   CLAVICLE EXCISION Left    HYPOSPADIAS CORRECTION     as child   KNEE SURGERY Bilateral    multiple   SHOULDER ARTHROSCOPY WITH ROTATOR  CUFF REPAIR Left 06/26/2015   Procedure: SHOULDER ARTHROSCOPY WITH ROTATOR CUFF REPAIR;  Surgeon: Elsie Saas, MD;  Location: [facility];  Service: Orthopedics;  Laterality: Left;    Family History  Problem Relation Age of Onset   Arthritis Mother    Ovarian cancer Mother    Alcohol abuse Father    Breast cancer Maternal Uncle    Breast cancer Maternal Grandmother    Cancer Maternal Grandmother        spinal surgery   Cancer Other    Hypertension Other    Diabetes Other    Breast cancer Maternal Great-grandfather    Colon cancer Neg Hx    Sleep apnea Neg Hx     Social History   Tobacco Use   Smoking status: Never   Smokeless tobacco: Current    Types: Chew   Tobacco comments:    form given 11/29/15  Substance Use Topics   Alcohol use: Yes    Alcohol/week: 0.0 standard drinks of alcohol    Comment: occasional    Subjective:   Presents to follow up on recent ER visit ( on 09/30/2022)- was diagnosed with pneumonia and treated with Augmentin; needs updated imaging; was treated for dysuria- suspected prostatitis at same visit; urine was not cultured however; is feeling better;  Would also like to discuss starting Zepbound; he was told his insurance would cover this medication for him; would like to get started on the medication.   Objective:  Vitals:   10/14/22 0824  BP: 116/72  Pulse: 64  Resp: 17  SpO2: 98%  Weight: (!) 311 lb 6.4 oz (141.3 kg)  Height: 5' 9"$  (1.753 m)    General: Well developed, well nourished, in no acute distress  Skin : Warm and dry.  Head: Normocephalic and atraumatic  Eyes: Sclera and conjunctiva clear; pupils round and reactive to light; extraocular movements intact  Ears: External normal; canals clear; tympanic membranes normal  Oropharynx: Pink, supple. No suspicious lesions  Neck: Supple without thyromegaly, adenopathy  Lungs: Respirations unlabored; clear to auscultation bilaterally without wheeze, rales, rhonchi  CVS  exam: normal rate and regular rhythm.  Abdomen: Soft; nontender; nondistended; normoactive bowel sounds; no masses or hepatosplenomegaly  Musculoskeletal: No deformities; no active joint inflammation  Extremities: No edema, cyanosis, clubbing  Vessels: Symmetric bilaterally  Neurologic: Alert and oriented; speech intact; face symmetrical; moves all extremities well; CNII-XII intact without focal deficit   Assessment:  1. Dysuria   2. Elevated glucose   3. Pneumonia of left lower lobe due to infectious organism   4. Morbid obesity with BMI of 45.0-49.9, adult ([place])     Plan:  Update urine culture today; Check Hgba1c today; Update CXR today to ensure resolution; Will start  Zepbound- risks/ benefits discussed; he will call back with response in 3-4 weeks;   No follow-ups on file.  Orders Placed This Encounter  Procedures   Urine Culture   DG Chest 2 View    Standing Status:   Future    Number of Occurrences:   1    Standing Expiration Date:   10/15/2023    Order Specific Question:   Reason for Exam (SYMPTOM  OR DIAGNOSIS REQUIRED)    Answer:   history of pneumonia    Order Specific Question:   Preferred imaging location?    Answer:   [facility]   Hemoglobin A1c   PSA   CBC with Differential/Platelet    Requested Prescriptions   Signed Prescriptions Disp Refills   tirzepatide (ZEPBOUND) 2.5 MG/0.5ML Pen 2 mL 0    Sig: Inject 2.5 mg into the skin once a week.

## 2022-10-15 ENCOUNTER — Encounter: Payer: Self-pay | Admitting: Family

## 2022-10-15 ENCOUNTER — Encounter: Payer: Self-pay | Admitting: Family Medicine

## 2022-10-15 LAB — URINE CULTURE
MICRO NUMBER:: 14558169
Result:: NO GROWTH
SPECIMEN QUALITY:: ADEQUATE

## 2022-10-15 NOTE — Telephone Encounter (Signed)
Pt informed via Mychart.

## 2022-10-15 NOTE — Telephone Encounter (Signed)
PA denied. Plan exclusion.

## 2022-10-28 ENCOUNTER — Encounter: Payer: Self-pay | Admitting: Family

## 2022-10-28 ENCOUNTER — Ambulatory Visit: Payer: Managed Care, Other (non HMO) | Admitting: Family

## 2022-10-28 ENCOUNTER — Ambulatory Visit (INDEPENDENT_AMBULATORY_CARE_PROVIDER_SITE_OTHER): Payer: Managed Care, Other (non HMO) | Admitting: Family

## 2022-10-28 VITALS — BP 138/76 | HR 88 | Resp 18 | Ht 69.0 in | Wt 299.0 lb

## 2022-10-28 DIAGNOSIS — Z77120 Contact with and (suspected) exposure to mold (toxic): Secondary | ICD-10-CM | POA: Diagnosis not present

## 2022-10-28 NOTE — Progress Notes (Signed)
Joshua Parrish is a 47 y.o. 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   Low back pain 01/09/2022   Rotator cuff disorder, left 12/25/2017   Bilateral shoulder pain 05/22/2017   Left knee pain 05/22/2017   Urinary frequency 02/09/2017   Essential hypertension 01/16/2017   Prostatitis 12/25/2016   Sepsis ([place]) 12/25/2016   GERD without esophagitis 12/25/2016   Sinusitis 12/09/2016   Tendinitis of right rotator cuff 12/09/2016   Laryngopharyngeal reflux (LPR) 02/04/2016   Mass in neck 11/27/2015   Rotator cuff impingement syndrome of left shoulder 06/19/2015   Acromioclavicular joint arthritis 06/19/2015    Current Outpatient Medications  Medication Sig Dispense Refill   albuterol (VENTOLIN HFA) 108 (90 Base) MCG/ACT inhaler Inhale 2 puffs into the lungs every 4 (four) hours as needed for wheezing or shortness of breath. 6.7 g 0   allopurinol (ZYLOPRIM) 300 MG tablet Take 2 tablets (600 mg total) by mouth daily. 180 tablet 3   amLODipine (NORVASC) 10 MG tablet TAKE 1 TABLET BY MOUTH DAILY 30 tablet 3   anastrozole (ARIMIDEX) 1 MG tablet Take 1 mg by mouth every Monday, Wednesday, and Friday.     buPROPion (WELLBUTRIN XL) 150 MG 24 hr tablet TAKE 1 TABLET BY MOUTH DAILY 90 tablet 1   colchicine 0.6 MG tablet Take 1 tablet (0.6 mg total) by mouth daily. 60 tablet 1   escitalopram (LEXAPRO) 20 MG tablet Take 1 tablet (20 mg total) by mouth daily. 90 tablet 3   gabapentin (NEURONTIN) 300 MG capsule Take 2 capsules (600 mg total) by mouth 3 (three) times daily. (Patient taking differently: Take 600 mg by mouth 3 (three) times daily as needed (pain).) 90 capsule 0   lamoTRIgine (LAMICTAL) 100 MG tablet Take  1 tablet (100 mg total) by mouth 2 (two) times daily. 60 tablet 11   loratadine (CLARITIN) 10 MG tablet Take 10 mg by mouth daily as needed for allergies or rhinitis.     omeprazole (PRILOSEC) 40 MG capsule TAKE ONE CAPSULE BY MOUTH DAILY 90 capsule 3   tadalafil (CIALIS) 20 MG tablet Take 20 mg by mouth in the morning.     testosterone cypionate (DEPOTESTOSTERONE CYPIONATE) 200 MG/ML injection Inject 100 mg into the muscle every Thursday.     valsartan-hydrochlorothiazide (DIOVAN-HCT) 320-25 MG tablet TAKE 1 TABLET BY MOUTH DAILY 30 tablet 3   No current facility-administered medications for this visit.    Allergies: Silicone, Coconut oil, and Lisinopril  Past Medical History:  Diagnosis Date   Anxiety    Chicken pox    Depression    Fatty liver    GERD (gastroesophageal reflux disease)    H/O hernia repair 03/31/2022   Hypertension    Migraines    Osteoarthritis of shoulder    left   Rotator cuff impingement syndrome of left shoulder 06/19/2015   Sinusitis    Sleep apnea     Past Surgical History:  Procedure Laterality Date   APPENDECTOMY     CERVICAL LAMINECTOMY     has plates and fusion   CHOLECYSTECTOMY N/A 12/24/2015   Procedure: LAPAROSCOPIC CHOLECYSTECTOMY ;  Surgeon: [name] Pickerel, MD;  Location: [facility];  Service: General;  Laterality: N/A;   CLAVICLE EXCISION Left    HYPOSPADIAS CORRECTION     as child   KNEE SURGERY Bilateral    multiple   SHOULDER ARTHROSCOPY WITH ROTATOR CUFF REPAIR Left 06/26/2015   Procedure: SHOULDER ARTHROSCOPY WITH ROTATOR CUFF REPAIR;  Surgeon: Elsie Saas, MD;  Location: [facility];  Service: Orthopedics;  Laterality: Left;    Family History  Problem Relation Age of Onset   Arthritis Mother    Ovarian cancer Mother    Alcohol abuse Father    Breast cancer Maternal Uncle    Breast cancer Maternal Grandmother    Cancer Maternal Grandmother        spinal surgery   Cancer Other    Hypertension Other    Diabetes Other     Breast cancer Maternal Great-grandfather    Colon cancer Neg Hx    Sleep apnea Neg Hx     Social History   Tobacco Use   Smoking status: Never   Smokeless tobacco: Current    Types: Chew   Tobacco comments:    form given 11/29/15  Substance Use Topics   Alcohol use: Yes    Alcohol/week: 0.0 standard drinks of alcohol    Comment: occasional    Subjective:   Patient notes that mold was recently found in his apartment; asking for referral to allergist to "make sure everything is okay."  Notes that stress level has been high recently- is working through break up with long term girlfriend; notes that appetite has been down/ optimistic they will be able to reconcile;   Had CXR done 2 weeks ago which was normal;     Objective:  Vitals:   10/28/22 0936 10/28/22 0957  BP: (!) 140/76 138/76  Pulse: 88   Resp: 18   SpO2: 98%   Weight: 299 lb (135.6 kg)   Height: '5\' 9"'$  (1.753 m)     General: Well developed, well nourished, in no acute distress  Skin : Warm and dry.  Head: Normocephalic and atraumatic  Lungs: Respirations unlabored;  Neurologic: Alert and oriented; speech intact; face symmetrical; moves all extremities well; CNII-XII intact without focal deficit   Assessment:  1. Mold exposure     Plan:  Reviewed recent CXR- normal exam; will refer to allergist per patient request; follow up as needed otherwise.   No follow-ups on file.  Orders Placed This Encounter  Procedures   Ambulatory referral to Allergy    Referral Priority:   Routine    Referral Type:   Allergy Testing    Referral Reason:   Specialty Services Required    Requested Specialty:   Allergy    Number of Visits Requested:   1    Requested Prescriptions    No prescriptions requested or ordered in this encounter

## 2022-10-31 ENCOUNTER — Telehealth: Payer: Self-pay | Admitting: Family

## 2022-10-31 NOTE — Telephone Encounter (Signed)
Pt called stating that he is not doing ok mentally and wanted to speak with Mickel Baas. Advised that Mickel Baas is currently seeing pts and offered to help. Pt stated that he had called around to several places and had no luck getting in with anyone to help with mental health. Recommended that he reach out to the [facility] to see if they could get him in. Pt stated that he had been hung up on several times by them. Recommended that he speak with the 24/7 [place] hotline to see about getting help with getting in somewhere. Pt asked to be transferred to them. Pt was transferred.

## 2022-11-06 ENCOUNTER — Telehealth: Payer: Self-pay

## 2022-11-06 NOTE — Telephone Encounter (Signed)
Received call from Ebony Hail w/ [employer] through [place], she is a Glass blower/designer- she wanted to make sure we are aware of denial for Zepbound and to see if Mickel Baas would prescribe any other weight loss related medications. I did inform her that Mickel Baas typically does not prescribe stimulant related weight loss medications. I gave Ebony Hail the number to [facility].

## 2022-11-11 ENCOUNTER — Other Ambulatory Visit: Payer: Self-pay

## 2022-11-11 ENCOUNTER — Encounter: Payer: Managed Care, Other (non HMO) | Admitting: Nurse Practitioner

## 2022-11-11 ENCOUNTER — Telehealth: Payer: Managed Care, Other (non HMO) | Admitting: Physician Assistant

## 2022-11-11 ENCOUNTER — Emergency Department (HOSPITAL_COMMUNITY)
Admission: EM | Admit: 2022-11-11 | Discharge: 2022-11-12 | Disposition: A | Discharge status: Other Acute Inpatient Hospital | Payer: 59 | Attending: Emergency Medicine | Admitting: Emergency Medicine

## 2022-11-11 ENCOUNTER — Encounter (HOSPITAL_COMMUNITY): Payer: Self-pay | Admitting: *Deleted

## 2022-11-11 DIAGNOSIS — Z79899 Other long term (current) drug therapy: Secondary | ICD-10-CM | POA: Insufficient documentation

## 2022-11-11 DIAGNOSIS — G4709 Other insomnia: Secondary | ICD-10-CM | POA: Insufficient documentation

## 2022-11-11 DIAGNOSIS — T50902A Poisoning by unspecified drugs, medicaments and biological substances, intentional self-harm, initial encounter: Secondary | ICD-10-CM | POA: Diagnosis not present

## 2022-11-11 DIAGNOSIS — Z1152 Encounter for screening for COVID-19: Secondary | ICD-10-CM | POA: Insufficient documentation

## 2022-11-11 DIAGNOSIS — I10 Essential (primary) hypertension: Secondary | ICD-10-CM | POA: Insufficient documentation

## 2022-11-11 DIAGNOSIS — T40692A Poisoning by other narcotics, intentional self-harm, initial encounter: Secondary | ICD-10-CM | POA: Insufficient documentation

## 2022-11-11 DIAGNOSIS — G4700 Insomnia, unspecified: Secondary | ICD-10-CM

## 2022-11-11 DIAGNOSIS — F329 Major depressive disorder, single episode, unspecified: Secondary | ICD-10-CM | POA: Insufficient documentation

## 2022-11-11 DIAGNOSIS — R45851 Suicidal ideations: Secondary | ICD-10-CM | POA: Insufficient documentation

## 2022-11-11 LAB — CBC WITH DIFFERENTIAL/PLATELET
Abs Immature Granulocytes: 0.01 K/uL (ref 0.00–0.07)
Basophils Absolute: 0 K/uL (ref 0.0–0.1)
Basophils Relative: 1 %
Eosinophils Absolute: 0 K/uL (ref 0.0–0.5)
Eosinophils Relative: 0 %
HCT: 42.3 % (ref 39.0–52.0)
Hemoglobin: 14.1 g/dL (ref 13.0–17.0)
Immature Granulocytes: 0 %
Lymphocytes Relative: 23 %
Lymphs Abs: 1.3 K/uL (ref 0.7–4.0)
MCH: 29.3 pg (ref 26.0–34.0)
MCHC: 33.3 g/dL (ref 30.0–36.0)
MCV: 87.8 fL (ref 80.0–100.0)
Monocytes Absolute: 0.5 K/uL (ref 0.1–1.0)
Monocytes Relative: 8 %
Neutro Abs: 3.9 K/uL (ref 1.7–7.7)
Neutrophils Relative %: 68 %
Platelets: 311 K/uL (ref 150–400)
RBC: 4.82 MIL/uL (ref 4.22–5.81)
RDW: 13.3 % (ref 11.5–15.5)
WBC: 5.7 K/uL (ref 4.0–10.5)
nRBC: 0 % (ref 0.0–0.2)

## 2022-11-11 LAB — COMPREHENSIVE METABOLIC PANEL
ALT: 43 U/L (ref 0–44)
AST: 30 U/L (ref 15–41)
Albumin: 4.4 g/dL (ref 3.5–5.0)
Alkaline Phosphatase: 80 U/L (ref 38–126)
Anion gap: 13 (ref 5–15)
BUN: 9 mg/dL (ref 6–20)
CO2: 25 mmol/L (ref 22–32)
Calcium: 8.8 mg/dL — ABNORMAL LOW (ref 8.9–10.3)
Chloride: 103 mmol/L (ref 98–111)
Creatinine, Ser: 1.07 mg/dL (ref 0.61–1.24)
GFR, Estimated: >60 mL/min (ref >60)
Glucose, Bld: 108 mg/dL — ABNORMAL HIGH (ref 70–99)
Potassium: 3.2 mmol/L — ABNORMAL LOW (ref 3.5–5.1)
Sodium: 141 mmol/L (ref 135–145)
Total Bilirubin: 0.7 mg/dL (ref 0.3–1.2)
Total Protein: 6.9 g/dL (ref 6.5–8.1)

## 2022-11-11 LAB — ACETAMINOPHEN LEVEL
Acetaminophen (Tylenol), Serum: <10 ug/mL — ABNORMAL LOW (ref 10–30)
Acetaminophen (Tylenol), Serum: <10 ug/mL — ABNORMAL LOW (ref 10–30)

## 2022-11-11 LAB — ETHANOL: Alcohol, Ethyl (B): 23 mg/dL — ABNORMAL HIGH (ref <10)

## 2022-11-11 LAB — SALICYLATE LEVEL: Salicylate Lvl: <7 mg/dL — ABNORMAL LOW (ref 7.0–30.0)

## 2022-11-11 LAB — CBG MONITORING, ED: Glucose-Capillary: 119 mg/dL — ABNORMAL HIGH (ref 70–99)

## 2022-11-11 MED ORDER — POTASSIUM CHLORIDE CRYS ER 20 MEQ PO TBCR
40.0000 meq | EXTENDED_RELEASE_TABLET | Freq: Once | ORAL | Status: AC
  Administered 2022-11-11: 40 meq via ORAL
  Filled 2022-11-11: qty 2

## 2022-11-11 MED ORDER — ESCITALOPRAM OXALATE 10 MG PO TABS
20.0000 mg | ORAL_TABLET | Freq: Every day | ORAL | Status: DC
  Administered 2022-11-12: 20 mg via ORAL
  Filled 2022-11-11: qty 2

## 2022-11-11 MED ORDER — BUPROPION HCL 75 MG PO TABS
75.0000 mg | ORAL_TABLET | Freq: Every day | ORAL | Status: DC
  Administered 2022-11-12: 75 mg via ORAL
  Filled 2022-11-11: qty 1

## 2022-11-11 NOTE — ED Triage Notes (Addendum)
BIB EMS after ingesting 38 '25mg'$  Hydroxyzine  that were prescribed last wee with a full glass of Rum. Pt  states he started thinking today about suicide. Recent breakup with girlfriend triggered event. 162/88-84-18-99% RA CBG 137  Pt states he's "a failure" people at work get frustrated. Mom hated ex girlfriend, unable to sleep.  Pt was on a virtual call with Doctor talking about sleeping pills around 1pm when he took the pills on the call.

## 2022-11-11 NOTE — ED Notes (Signed)
Poison control called and case started. Recommended to hold pt for observation for 6 hours. Monitor QTc, and QRS with repeat EKG at the 6 hr mark. Repeat tylenol and co-ingestion labs at 4 hr mark. Benzodiazapine's recommended for agitation/sedation.

## 2022-11-11 NOTE — Progress Notes (Signed)
Hinton Dyer,  We cannot address sleep issues over E-visits at this time. You are welcome to schedule a Video Visit with one of our providers so we can talk more about what has been going on, or contact your primary care.    If you convert to a video visit, we will bill your insurance (similar to an office visit) and you will not be charged for this e-Visit. You will be able to stay at home and speak with the first available [facility] advanced practice provider. The link to do a video visit is in the drop down Menu tab of your Welcome screen in [place].     NOTE: You will NOT be charged for this eVisit.  If you do not have a PCP, [facility] offers a free physician referral service available at [phone]. Our trained staff has the experience, knowledge and resources to put you in touch with a physician who is right for you.    If you are having a true medical emergency please call 911.   Your e-visit answers were reviewed by a board certified advanced clinical practitioner to complete your personal care plan.  Thank you for using e-Visits.

## 2022-11-11 NOTE — ED Provider Notes (Signed)
[facility] Provider Note   CSN: [id number] Arrival date & time: 11/11/22  1416     History  Chief Complaint  Patient presents with   Drug Overdose   Suicidal    Joshua Parrish is a 47 y.o. male.  Patient is a 47 year old male with a past medical history of depression and hypertension that presented to the emergency department after an intentional drug overdose.  Patient states that he has been having insomnia for the last week and was started on hydroxyzine however states that his insomnia has only gotten worse.  He states that he has been having thoughts of feeling worthless and today while talking to his doctor in a televisit had a sudden thought of wanting to kill himself and he took approximately 38 tabs of 25 mg hydroxyzine.  His primary doctor called 911 for him and brought him here.  He denies any coingestions.  He denies any HI or hallucinations. He has no physical complaints.  The history is provided by the patient and the EMS personnel.  Drug Overdose       Home Medications Prior to Admission medications   Medication Sig Start Date End Date Taking? Authorizing Provider  albuterol (VENTOLIN HFA) 108 (90 Base) MCG/ACT inhaler Inhale 2 puffs into the lungs every 4 (four) hours as needed for wheezing or shortness of breath. 08/16/22   Shalhoub, Sherryll Burger, MD  allopurinol (ZYLOPRIM) 300 MG tablet Take 2 tablets (600 mg total) by mouth daily. 06/13/22   Gregor Hams, MD  amLODipine (NORVASC) 10 MG tablet TAKE 1 TABLET BY MOUTH DAILY 09/02/22   Marrian Salvage, FNP  anastrozole (ARIMIDEX) 1 MG tablet Take 1 mg by mouth every Monday, Wednesday, and Friday.    [provider]  buPROPion (WELLBUTRIN XL) 150 MG 24 hr tablet TAKE 1 TABLET BY MOUTH DAILY 09/08/22   Shelda Pal, DO  colchicine 0.6 MG tablet Take 1 tablet (0.6 mg total) by mouth daily. 06/13/22   Gregor Hams, MD  escitalopram (LEXAPRO) 20 MG tablet  Take 1 tablet (20 mg total) by mouth daily. 03/21/22   Marrian Salvage, FNP  gabapentin (NEURONTIN) 300 MG capsule Take 2 capsules (600 mg total) by mouth 3 (three) times daily. Patient taking differently: Take 600 mg by mouth 3 (three) times daily as needed (pain). 02/18/22   Lavina Hamman, MD  lamoTRIgine (LAMICTAL) 100 MG tablet Take 1 tablet (100 mg total) by mouth 2 (two) times daily. 01/09/22   Marcial Pacas, MD  loratadine (CLARITIN) 10 MG tablet Take 10 mg by mouth daily as needed for allergies or rhinitis.    [provider]  omeprazole (PRILOSEC) 40 MG capsule TAKE ONE CAPSULE BY MOUTH DAILY 09/29/22   Marrian Salvage, FNP  tadalafil (CIALIS) 20 MG tablet Take 20 mg by mouth in the morning.    [provider]  testosterone cypionate (DEPOTESTOSTERONE CYPIONATE) 200 MG/ML injection Inject 100 mg into the muscle every Thursday. 03/07/22   [provider]  valsartan-hydrochlorothiazide (DIOVAN-HCT) 320-25 MG tablet TAKE 1 TABLET BY MOUTH DAILY 09/02/22   Marrian Salvage, FNP      Allergies    Silicone, Coconut oil, and Lisinopril    Review of Systems   Review of Systems  Physical Exam Updated Vital Signs BP 129/80   Pulse 90   Temp 98.4 F (36.9 C) (Oral)   Resp (!) 25   Ht '5\' 9"'$  (1.753 m)  Wt 127 kg   SpO2 95%   BMI 41.35 kg/m  Physical Exam Vitals and nursing note reviewed.  Constitutional:      General: He is not in acute distress.    Appearance: Normal appearance.  HENT:     Head: Normocephalic and atraumatic.     Nose: Nose normal.     Mouth/Throat:     Mouth: Mucous membranes are moist.     Pharynx: Oropharynx is clear.  Eyes:     Extraocular Movements: Extraocular movements intact.     Conjunctiva/sclera: Conjunctivae normal.  Cardiovascular:     Rate and Rhythm: Normal rate and regular rhythm.     Heart sounds: Normal heart sounds.  Pulmonary:     Effort: Pulmonary effort is normal.     Breath sounds: Normal breath  sounds.  Abdominal:     General: Abdomen is flat.     Palpations: Abdomen is soft.     Tenderness: There is no abdominal tenderness.  Musculoskeletal:        General: Normal range of motion.     Cervical back: Normal range of motion and neck supple.  Skin:    General: Skin is warm and dry.  Neurological:     General: No focal deficit present.     Mental Status: He is alert and oriented to person, place, and time.     Sensory: No sensory deficit.     Motor: No weakness.  Psychiatric:        Behavior: Behavior normal.     Comments: Tearful, depressed mood, cooperative     ED Results / Procedures / Treatments   Labs (all labs ordered are listed, but only abnormal results are displayed) Labs Reviewed  COMPREHENSIVE METABOLIC PANEL - Abnormal; Notable for the following components:      Result Value   Potassium 3.2 (*)    Glucose, Bld 108 (*)    Calcium 8.8 (*)    All other components within normal limits  ETHANOL - Abnormal; Notable for the following components:   Alcohol, Ethyl (B) 23 (*)    All other components within normal limits  ACETAMINOPHEN LEVEL - Abnormal; Notable for the following components:   Acetaminophen (Tylenol), Serum <10 (*)    All other components within normal limits  SALICYLATE LEVEL - Abnormal; Notable for the following components:   Salicylate Lvl [id number] (*)    All other components within normal limits  ACETAMINOPHEN LEVEL - Abnormal; Notable for the following components:   Acetaminophen (Tylenol), Serum <10 (*)    All other components within normal limits  CBG MONITORING, ED - Abnormal; Notable for the following components:   Glucose-Capillary 119 (*)    All other components within normal limits  CBC WITH DIFFERENTIAL/PLATELET    EKG EKG Interpretation  Date/Time:  Tuesday November 11 2022 19:10:10 EDT Ventricular Rate:  95 PR Interval:  147 QRS Duration: 96 QT Interval:  373 QTC Calculation: 469 R Axis:   23 Text Interpretation: Sinus rhythm  Abnormal R-wave progression, late transition No significant change since last tracing Confirmed by Leanord Asal ([id number]) on 11/11/2022 7:43:19 PM  Radiology No results found.  Procedures Procedures    Medications Ordered in ED Medications  potassium chloride SA (KLOR-CON M) CR tablet 40 mEq (40 mEq Oral Given 11/11/22 1612)    ED Course/ Medical Decision Making/ A&P Clinical Course as of 11/11/22 1944  Tue Nov 11, 2022  1647 Initial labs with mild hypokalemia which will be repleted, otherwise within  normal range. Rpt APAP level at 1700 and will have repeat EKG at 1900. [VK]  1943 No significant change on repeat EKG. Patient is medically cleared for psych eval. [VK]    Clinical Course User Index [VK] Kemper Durie, DO                             Medical Decision Making This patient presents to the ED with chief complaint(s) of intentional overdose with pertinent past medical history of HTN, depression which further complicates the presenting complaint. The complaint involves an extensive differential diagnosis and also carries with it a high risk of complications and morbidity.    The differential diagnosis includes suicide attempt, suicidal ideation, drug overdose, possible coingestions with Tylenol or salicylate, patient has no other medical complaints at this time  Additional history obtained: Additional history obtained from EMS  Records reviewed Primary Care Documents  ED Course and Reassessment: Patient was hemodynamically stable on arrival in no acute distress.  Poison control was called by nursing staff who recommended EKG now, 4-hour Tylenol level and a total of 6-hour observation with repeat EKG at the end of the 6 hours.  Patient's ingestion time was around 1300.  Patient's initial EKG had normal QTc and QRS intervals.  He will continue to be monitored on telemetry.  He was placed on one-to-one observation.  He would currently like to be voluntary.  Independent  labs interpretation:  The following labs were independently interpreted: within normal range  Independent visualization of imaging: N/A  Consultation: - Consulted or discussed management/test interpretation w/ external professional: TTS      Amount and/or Complexity of Data Reviewed Labs: ordered.  Risk Prescription drug management.          Final Clinical Impression(s) / ED Diagnoses Final diagnoses:  Intentional overdose, initial encounter [facility])  Suicidal ideation    Rx / DC Orders ED Discharge Orders     None         Kemper Durie, DO 11/11/22 1944

## 2022-11-11 NOTE — Progress Notes (Signed)
Virtual Visit Consent   Joshua Parrish, you are scheduled for a virtual visit with a [facility] provider today. Just as with appointments in the office, your consent must be obtained to participate. Your consent will be active for this visit and any virtual visit you may have with one of our providers in the next 365 days. If you have a MyChart account, a copy of this consent can be sent to you electronically.  As this is a virtual visit, video technology does not allow for your provider to perform a traditional examination. This may limit your provider's ability to fully assess your condition. If your provider identifies any concerns that need to be evaluated in person or the need to arrange testing (such as labs, EKG, etc.), we will make arrangements to do so. Although advances in technology are sophisticated, we cannot ensure that it will always work on either your end or our end. If the connection with a video visit is poor, the visit may have to be switched to a telephone visit. With either a video or telephone visit, we are not always able to ensure that we have a secure connection.  By engaging in this virtual visit, you consent to the provision of healthcare and authorize for your insurance to be billed (if applicable) for the services provided during this visit. Depending on your insurance coverage, you may receive a charge related to this service.  I need to obtain your verbal consent now. Are you willing to proceed with your visit today? Joshua Parrish has provided verbal consent on 11/11/2022 for a virtual visit (video or telephone). Mar Daring, PA-C  Date: 11/11/2022 1:28 PM  Virtual Visit via Video Note   I, Mar Daring, connected with  Joshua Parrish  ([id number], [birth year]) on 11/11/22 at  1:00 PM EDT by a video-enabled telemedicine application and verified that I am speaking with the correct person using two identifiers.  Location: Patient: Virtual Visit Location Patient:  Home Provider: Virtual Visit Location Provider: Home Office   I discussed the limitations of evaluation and management by telemedicine and the availability of in person appointments. The patient expressed understanding and agreed to proceed.    History of Present Illness: Joshua Parrish is a 47 y.o. who identifies as a male who was assigned male at birth, and is being seen today for insomnia. Reports was given Hydroxyzine by psychiatrist and reports increases anxiety and he has been awake since 3am yesterday. He reports to me that he has taken 26 hydroxyzine. While on video he went and took more out of the bottle. Provider initiated [id number] and police and ambulance are reporting to property. While provider was trying to trying to talk with patient after 911 call, patient cut video. Provider attempted to call patient multiple times and calls were forwarded to voicemail. Provider also tried to contact emergency contact, but was unable to reach them.    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   Low back pain 01/09/2022   Rotator cuff disorder, left 12/25/2017   Bilateral shoulder pain 05/22/2017   Left knee pain 05/22/2017   Urinary frequency 02/09/2017   Essential hypertension 01/16/2017   Prostatitis 12/25/2016   Sepsis ([place])  12/25/2016   GERD without esophagitis 12/25/2016   Sinusitis 12/09/2016   Tendinitis of right rotator cuff 12/09/2016   Laryngopharyngeal reflux (LPR) 02/04/2016   Mass in neck 11/27/2015   Rotator cuff impingement syndrome of left shoulder 06/19/2015   Acromioclavicular joint arthritis 06/19/2015    Allergies:  Allergies  Allergen Reactions   Silicone Rash    Dermabond   Coconut Oil Diarrhea, Nausea And Vomiting and Other  (See Comments)    Allergic to coconuts   Lisinopril Cough   Medications:  Current Outpatient Medications:    albuterol (VENTOLIN HFA) 108 (90 Base) MCG/ACT inhaler, Inhale 2 puffs into the lungs every 4 (four) hours as needed for wheezing or shortness of breath., Disp: 6.7 g, Rfl: 0   allopurinol (ZYLOPRIM) 300 MG tablet, Take 2 tablets (600 mg total) by mouth daily., Disp: 180 tablet, Rfl: 3   amLODipine (NORVASC) 10 MG tablet, TAKE 1 TABLET BY MOUTH DAILY, Disp: 30 tablet, Rfl: 3   anastrozole (ARIMIDEX) 1 MG tablet, Take 1 mg by mouth every Monday, Wednesday, and Friday., Disp: , Rfl:    buPROPion (WELLBUTRIN XL) 150 MG 24 hr tablet, TAKE 1 TABLET BY MOUTH DAILY, Disp: 90 tablet, Rfl: 1   colchicine 0.6 MG tablet, Take 1 tablet (0.6 mg total) by mouth daily., Disp: 60 tablet, Rfl: 1   escitalopram (LEXAPRO) 20 MG tablet, Take 1 tablet (20 mg total) by mouth daily., Disp: 90 tablet, Rfl: 3   gabapentin (NEURONTIN) 300 MG capsule, Take 2 capsules (600 mg total) by mouth 3 (three) times daily. (Patient taking differently: Take 600 mg by mouth 3 (three) times daily as needed (pain).), Disp: 90 capsule, Rfl: 0   lamoTRIgine (LAMICTAL) 100 MG tablet, Take 1 tablet (100 mg total) by mouth 2 (two) times daily., Disp: 60 tablet, Rfl: 11   loratadine (CLARITIN) 10 MG tablet, Take 10 mg by mouth daily as needed for allergies or rhinitis., Disp: , Rfl:    omeprazole (PRILOSEC) 40 MG capsule, TAKE ONE CAPSULE BY MOUTH DAILY, Disp: 90 capsule, Rfl: 3   tadalafil (CIALIS) 20 MG tablet, Take 20 mg by mouth in the morning., Disp: , Rfl:    testosterone cypionate (DEPOTESTOSTERONE CYPIONATE) 200 MG/ML injection, Inject 100 mg into the muscle every Thursday., Disp: , Rfl:    valsartan-hydrochlorothiazide (DIOVAN-HCT) 320-25 MG tablet, TAKE 1 TABLET BY MOUTH DAILY, Disp: 30 tablet, Rfl: 3  Observations/Objective: Patient is well-developed, well-nourished in no acute distress.  Resting comfortably at home.   Head is normocephalic, atraumatic.  No labored breathing.  Speech is clear and coherent with logical content.  Patient is alert and oriented at baseline.    Assessment and Plan: 1. OD (overdose of drug), intentional self-harm, initial encounter ([place])  - 911 dispatched to patient  - Provider attempted many contacts and unable to proceed - Provider number given to EMS dispatch in case I am needed  Follow Up Instructions: I discussed the assessment and treatment plan with the patient. The patient was provided an opportunity to ask questions and all were answered. The patient agreed with the plan and demonstrated an understanding of the instructions.  A copy of instructions were sent to the patient via MyChart unless otherwise noted below.    The patient was advised to call back or seek an in-person evaluation if the symptoms worsen or if the condition fails to improve as anticipated.  Time:  I spent 30 minutes with the patient via telehealth technology discussing the above problems/concerns.  Mar Daring, PA-C

## 2022-11-11 NOTE — ED Notes (Signed)
Pt is give sandwich diet cola and water.

## 2022-11-11 NOTE — Consult Note (Signed)
[place] ED ASSESSMENT   Reason for Consult:  Psychiatry evaluation Referring Physician:  ER Physician Patient Identification: Joshua Parrish MRN:  [id number] ED Chief Complaint: OD (overdose of drug), intentional self-harm, initial encounter [facility])  Diagnosis:  Principal Problem:   OD (overdose of drug), intentional self-harm, initial encounter [facility]) Active Problems:   Insomnia disorder, with other sleep disorder, recurrent   ED Assessment Time Calculation: Start Time: 2011 Stop Time: 2038 Total Time in Minutes (Assessment Completion): 27   Subjective:   Joshua Parrish is a 47 y.o. male patient admitted with previous hx anxiety, insomnia and depression brought in by EMS after he ingested 38 capsules of Hydroxyzine 25 mg with  a glass full of RUM.Marland KitchenPatient reports his head is over thinking about so many things including fleeting SI  HPI:  Well groomed, obese male seen this evening for evaluation.  Patient fully participated in the assessment.  Patient reports that he has had issue with sleep and nothing seems to help.  He also suffers from sleep apnea and uses CPAP Machine.  Patient suffers from Depression and anxiety and rates both 4/10 with 10 being severe anxiety and depression.  Patient reports that his intent for taking the amount of Hydroxyzine and rum was to sleep and not to kill himself.  Patient reports that he has tried many different sleep medications and nothing seem to work.  Patient reports that lately however he has been going through stressful situation involving relationship, financial issues, work related stressors and family issues.  He lives alone and is fully employed and loves his job.  Patient denies inpatient mental health hospitalization but father completed suicide with a gun.  He reports suicide attempt one time long ago by using alcohol to drink himself to death.  Patient reports he passed out and woke up the next morning surprised that he is still alive.  Patient sees Karalee Height  as his mental health provider and therapist is Darnelle Bos.   Patient is awake, alert and cooperative and calm.  He vehemently denied SI/HI/AVH.  Patient again reports that he took the Hydroxyzine with [facility] for sleep.  He has been getting only two to three hours of sleep at night and denies day time nap.  He reports good appetite. Patient lives alone, father completed suicide with a gun and has multiple stressors,  we will consider psychiatry inpatient hospitalization for safety and stabilization.  We will fax out records to facilities with available bed.  Home   medications will be resumed while waiting for bed.  Past Psychiatric History: previous hx anxiety, insomnia and depression.  No mental health inpatient hospitalization.  Karalee Height is Psychiatrist while Darnelle Bos is therapist.    Risk to Self or Others: Is the patient at risk to self? No Has the patient been a risk to self in the past 6 months? No Has the patient been a risk to self within the distant past? No Is the patient a risk to others? No Has the patient been a risk to others in the past 6 months? No Has the patient been a risk to others within the distant past? No  [place] Scale:  [facility] from 11/11/2022 in [facility] Emergency Department at [facility] ED from 09/30/2022 in [facility] Emergency Department at [facility] ED to Hosp-Admission (Discharged) from 08/14/2022 in [place] CATEGORY High Risk No Risk Low Risk  AIMS:  , , ,  ,   ASAM:    Substance Abuse:     Past Medical History:  Past Medical History:  Diagnosis Date   Anxiety    Chicken pox    Depression    Fatty liver    GERD (gastroesophageal reflux disease)    H/O hernia repair 03/31/2022   Hypertension    Migraines    Osteoarthritis of shoulder    left   Rotator cuff impingement syndrome of left shoulder 06/19/2015   Sinusitis    Sleep apnea     Past  Surgical History:  Procedure Laterality Date   APPENDECTOMY     CERVICAL LAMINECTOMY     has plates and fusion   CHOLECYSTECTOMY N/A 12/24/2015   Procedure: LAPAROSCOPIC CHOLECYSTECTOMY ;  Surgeon: [name] Pickerel, MD;  Location: [facility];  Service: General;  Laterality: N/A;   CLAVICLE EXCISION Left    HYPOSPADIAS CORRECTION     as child   KNEE SURGERY Bilateral    multiple   SHOULDER ARTHROSCOPY WITH ROTATOR CUFF REPAIR Left 06/26/2015   Procedure: SHOULDER ARTHROSCOPY WITH ROTATOR CUFF REPAIR;  Surgeon: Elsie Saas, MD;  Location: [place];  Service: Orthopedics;  Laterality: Left;   Family History:  Family History  Problem Relation Age of Onset   Arthritis Mother    Ovarian cancer Mother    Alcohol abuse Father    Breast cancer Maternal Uncle    Breast cancer Maternal Grandmother    Cancer Maternal Grandmother        spinal surgery   Cancer Other    Hypertension Other    Diabetes Other    Breast cancer Maternal Great-grandfather    Colon cancer Neg Hx    Sleep apnea Neg Hx    Family Psychiatric  History: Father completed suicide with a gun.  No family hx of mental illness. Social History:  Social History   Substance and Sexual Activity  Alcohol Use Yes   Alcohol/week: 0.0 standard drinks of alcohol   Comment: occasional     Social History   Substance and Sexual Activity  Drug Use No    Social History   Socioeconomic History   Marital status: Divorced    Spouse name: Not on file   Number of children: 1   Years of education: 12   Highest education level: Not on file  Occupational History   Occupation: Garage Interior and spatial designer   Tobacco Use   Smoking status: Never   Smokeless tobacco: Current    Types: Chew   Tobacco comments:    form given 11/29/15  Vaping Use   Vaping Use: Never used  Substance and Sexual Activity   Alcohol use: Yes    Alcohol/week: 0.0 standard drinks of alcohol    Comment: occasional   Drug use: No   Sexual activity:  Yes  Other Topics Concern   Not on file  Social History Narrative   Fun: Lift weights, hike   Social Determinants of Health   Financial Resource Strain: Not on file  Food Insecurity: No Food Insecurity (08/14/2022)   Hunger Vital Sign    Worried About Running Out of Food in the Last Year: Never true    Ran Out of Food in the Last Year: Never true  Transportation Needs: No Transportation Needs (08/14/2022)   PRAPARE - Hydrologist (Medical): No    Lack of Transportation (Non-Medical): No  Physical Activity: Not on file  Stress:  Not on file  Social Connections: Not on file   Additional Social History:    Allergies:   Allergies  Allergen Reactions   Silicone Rash    Dermabond   Coconut Oil Diarrhea, Nausea And Vomiting and Other (See Comments)    Allergic to coconuts   Lisinopril Cough    Labs:  Results for orders placed or performed during the hospital encounter of 11/11/22 (from the past 48 hour(s))  CBC with Differential     Status: None   Collection Time: 11/11/22  2:48 PM  Result Value Ref Range   WBC 5.7 4.0 - 10.5 K/uL   RBC 4.82 4.22 - 5.81 MIL/uL   Hemoglobin 14.1 13.0 - 17.0 g/dL   HCT 42.3 39.0 - 52.0 %   MCV 87.8 80.0 - 100.0 fL   MCH 29.3 26.0 - 34.0 pg   MCHC 33.3 30.0 - 36.0 g/dL   RDW 13.3 11.5 - 15.5 %   Platelets 311 150 - 400 K/uL   nRBC 0.0 0.0 - 0.2 %   Neutrophils Relative % 68 %   Neutro Abs 3.9 1.7 - 7.7 K/uL   Lymphocytes Relative 23 %   Lymphs Abs 1.3 0.7 - 4.0 K/uL   Monocytes Relative 8 %   Monocytes Absolute 0.5 0.1 - 1.0 K/uL   Eosinophils Relative 0 %   Eosinophils Absolute 0.0 0.0 - 0.5 K/uL   Basophils Relative 1 %   Basophils Absolute 0.0 0.0 - 0.1 K/uL   Immature Granulocytes 0 %   Abs Immature Granulocytes 0.01 0.00 - 0.07 K/uL    Comment: Performed at [facility], [place] [address], [place], [place] [address]  Comprehensive metabolic panel     Status: Abnormal   Collection  Time: 11/11/22  2:48 PM  Result Value Ref Range   Sodium 141 135 - 145 mmol/L   Potassium 3.2 (L) 3.5 - 5.1 mmol/L   Chloride 103 98 - 111 mmol/L   CO2 25 22 - 32 mmol/L   Glucose, Bld 108 (H) 70 - 99 mg/dL    Comment: Glucose reference range applies only to samples taken after fasting for at least 8 hours.   BUN 9 6 - 20 mg/dL   Creatinine, Ser 1.07 0.61 - 1.24 mg/dL   Calcium 8.8 (L) 8.9 - 10.3 mg/dL   Total Protein 6.9 6.5 - 8.1 g/dL   Albumin 4.4 3.5 - 5.0 g/dL   AST 30 15 - 41 U/L   ALT 43 0 - 44 U/L   Alkaline Phosphatase 80 38 - 126 U/L   Total Bilirubin 0.7 0.3 - 1.2 mg/dL   GFR, Estimated >60 >60 mL/min    Comment: (NOTE) Calculated using the CKD-EPI Creatinine Equation (2021)    Anion gap 13 5 - 15    Comment: Performed at [facility], [place] [address] [place], [place] [address]  Ethanol     Status: Abnormal   Collection Time: 11/11/22  2:48 PM  Result Value Ref Range   Alcohol, Ethyl (B) 23 (H) <10 mg/dL    Comment: (NOTE) Lowest detectable limit for serum alcohol is 10 mg/dL.  For medical purposes only. Performed at [facility], [place] [address] [place], [place] [address]   Acetaminophen level     Status: Abnormal   Collection Time: 11/11/22  2:48 PM  Result Value Ref Range   Acetaminophen (Tylenol), Serum <10 (L) 10 - 30 ug/mL    Comment: (NOTE) Therapeutic concentrations vary  significantly. A range of 10-30 ug/mL  may be an effective concentration for many patients. However, some  are best treated at concentrations outside of this range. Acetaminophen concentrations >150 ug/mL at 4 hours after ingestion  and >50 ug/mL at 12 hours after ingestion are often associated with  toxic reactions.  Performed at [facility], [place] [address] [place], [place] [id number]   Salicylate level     Status: Abnormal   Collection Time: 11/11/22  2:48 PM  Result Value Ref Range   Salicylate Lvl [id number] (L) 7.0 - 30.0  mg/dL    Comment: Performed at [facility], [place] [address] [place], [place] [address]  CBG monitoring, ED     Status: Abnormal   Collection Time: 11/11/22  2:50 PM  Result Value Ref Range   Glucose-Capillary 119 (H) 70 - 99 mg/dL    Comment: Glucose reference range applies only to samples taken after fasting for at least 8 hours.  Acetaminophen level     Status: Abnormal   Collection Time: 11/11/22  5:03 PM  Result Value Ref Range   Acetaminophen (Tylenol), Serum <10 (L) 10 - 30 ug/mL    Comment: (NOTE) Therapeutic concentrations vary significantly. A range of 10-30 ug/mL  may be an effective concentration for many patients. However, some  are best treated at concentrations outside of this range. Acetaminophen concentrations >150 ug/mL at 4 hours after ingestion  and >50 ug/mL at 12 hours after ingestion are often associated with  toxic reactions.  Performed at [facility], [place] [address] [place], [place] [address]     Current Facility-Administered Medications  Medication Dose Route Frequency Provider Last Rate Last Admin   [START ON 11/12/2022] buPROPion [facility]) tablet 75 mg  75 mg Oral Q1200 Delfin Gant, NP       [START ON 11/12/2022] escitalopram (LEXAPRO) tablet 20 mg  20 mg Oral Daily Charmaine Downs C, NP       Current Outpatient Medications  Medication Sig Dispense Refill   albuterol (VENTOLIN HFA) 108 (90 Base) MCG/ACT inhaler Inhale 2 puffs into the lungs every 4 (four) hours as needed for wheezing or shortness of breath. 6.7 g 0   allopurinol (ZYLOPRIM) 300 MG tablet Take 2 tablets (600 mg total) by mouth daily. 180 tablet 3   amLODipine (NORVASC) 10 MG tablet TAKE 1 TABLET BY MOUTH DAILY 30 tablet 3   anastrozole (ARIMIDEX) 1 MG tablet Take 1 mg by mouth every Monday, Wednesday, and Friday.     buPROPion (WELLBUTRIN XL) 150 MG 24 hr tablet TAKE 1 TABLET BY MOUTH DAILY 90 tablet 1   colchicine 0.6 MG tablet Take 1  tablet (0.6 mg total) by mouth daily. 60 tablet 1   escitalopram (LEXAPRO) 20 MG tablet Take 1 tablet (20 mg total) by mouth daily. 90 tablet 3   gabapentin (NEURONTIN) 300 MG capsule Take 2 capsules (600 mg total) by mouth 3 (three) times daily. (Patient taking differently: Take 600 mg by mouth 3 (three) times daily as needed (pain).) 90 capsule 0   lamoTRIgine (LAMICTAL) 100 MG tablet Take 1 tablet (100 mg total) by mouth 2 (two) times daily. 60 tablet 11   loratadine (CLARITIN) 10 MG tablet Take 10 mg by mouth daily as needed for allergies or rhinitis.     omeprazole (PRILOSEC) 40 MG capsule TAKE ONE CAPSULE BY MOUTH DAILY 90 capsule 3   tadalafil (CIALIS) 20 MG tablet Take 20 mg by mouth in the  morning.     testosterone cypionate (DEPOTESTOSTERONE CYPIONATE) 200 MG/ML injection Inject 100 mg into the muscle every Thursday.     valsartan-hydrochlorothiazide (DIOVAN-HCT) 320-25 MG tablet TAKE 1 TABLET BY MOUTH DAILY 30 tablet 3    Musculoskeletal: Strength & Muscle Tone: within normal limits Gait & Station: normal Patient leans: Front   Psychiatric Specialty Exam: Presentation  General Appearance:  Casual; Other (comment); Neat (Morbid obesity)  Eye Contact: Good  Speech: Clear and Coherent; Normal Rate  Speech Volume: Normal  Handedness: Right   Mood and Affect  Mood: Depressed; Anxious  Affect: Congruent   Thought Process  Thought Processes: Coherent; Goal Directed  Descriptions of Associations:Intact  Orientation:Full (Time, Place and Person)  Thought Content:Logical  History of Schizophrenia/Schizoaffective disorder:No data recorded Duration of Psychotic Symptoms:No data recorded Hallucinations:Hallucinations: None  Ideas of Reference:None  Suicidal Thoughts:Suicidal Thoughts: No  Homicidal Thoughts:Homicidal Thoughts: No   Sensorium  Memory: Immediate Good; Recent Good; Remote Good  Judgment: Intact  Insight: Present   Executive  Functions  Concentration: Good  Attention Span: Good  Recall: Good  Fund of Knowledge: Good  Language: Good   Psychomotor Activity  Psychomotor Activity: Psychomotor Activity: Normal   Assets  Assets: Communication Skills; Desire for Improvement; Housing; Catering manager; Physical Health    Sleep  Sleep: Sleep: Poor   Physical Exam: Physical Exam Vitals and nursing note reviewed.  Constitutional:      Appearance: He is obese.  HENT:     Head: Normocephalic and atraumatic.     Nose: Nose normal.  Cardiovascular:     Rate and Rhythm: Normal rate and regular rhythm.  Pulmonary:     Effort: Pulmonary effort is normal.  Musculoskeletal:        General: Normal range of motion.     Cervical back: Normal range of motion.  Skin:    General: Skin is warm and dry.  Neurological:     General: No focal deficit present.     Mental Status: He is alert and oriented to person, place, and time.  Psychiatric:        Attention and Perception: Attention and perception normal.        Mood and Affect: Mood is anxious.        Speech: Speech normal.        Behavior: Behavior normal. Behavior is cooperative.        Thought Content: Thought content normal.        Cognition and Memory: Cognition and memory normal.        Judgment: Judgment is impulsive.    Review of Systems  Constitutional: Negative.   HENT: Negative.    Eyes: Negative.   Respiratory: Negative.    Cardiovascular: Negative.   Gastrointestinal: Negative.   Genitourinary: Negative.   Musculoskeletal: Negative.   Skin: Negative.   Neurological: Negative.   Endo/Heme/Allergies: Negative.   Psychiatric/Behavioral:  Positive for depression and suicidal ideas. The patient is nervous/anxious and has insomnia.    Blood pressure 129/80, pulse 90, temperature 98.4 F (36.9 C), temperature source Oral, resp. rate (!) 25, height '5\' 9"'$  (1.753 m), weight 127 kg, SpO2 95 %. Body mass index is 41.35  kg/m.  Medical Decision Making: Patient lives alone, father completed suicide with a gun and has multiple stressors,  we will consider psychiatry inpatient hospitalization for safety and stabilization.  We will fax out records to facilities with available bed.  Home   medications will be resumed while waiting for bed. Resume  Lexapro 20 mg po daily.  Wellbutrin 75 mg po daily-(dose per patient report -Decreased by [facility] Psychiatrist when Lexapro was added.)  Disposition:  Admit, seek bed placement.  Delfin Gant, NP-PMHNP-BC 11/11/2022 8:42 PM

## 2022-11-11 NOTE — ED Notes (Signed)
Pt has one belonging bag in the cabinet behind 13-18. Pt has shirt, shorts, socks,shoes and phone and watch which were placed inside shoes.

## 2022-11-12 ENCOUNTER — Encounter (HOSPITAL_COMMUNITY): Payer: Self-pay | Admitting: Nurse Practitioner

## 2022-11-12 ENCOUNTER — Inpatient Hospital Stay (HOSPITAL_COMMUNITY)
Admission: AD | Admit: 2022-11-12 | Discharge: 2022-11-16 | DRG: 885 | Disposition: A | Discharge status: Home / Self Care | Payer: 59 | Source: Intra-hospital | Attending: Psychiatry | Admitting: Psychiatry

## 2022-11-12 ENCOUNTER — Other Ambulatory Visit: Payer: Self-pay

## 2022-11-12 DIAGNOSIS — G4700 Insomnia, unspecified: Secondary | ICD-10-CM | POA: Diagnosis present

## 2022-11-12 DIAGNOSIS — T43592A Poisoning by other antipsychotics and neuroleptics, intentional self-harm, initial encounter: Secondary | ICD-10-CM | POA: Diagnosis present

## 2022-11-12 DIAGNOSIS — Z79899 Other long term (current) drug therapy: Secondary | ICD-10-CM

## 2022-11-12 DIAGNOSIS — Z818 Family history of other mental and behavioral disorders: Secondary | ICD-10-CM | POA: Diagnosis not present

## 2022-11-12 DIAGNOSIS — I10 Essential (primary) hypertension: Secondary | ICD-10-CM | POA: Diagnosis present

## 2022-11-12 DIAGNOSIS — M19012 Primary osteoarthritis, left shoulder: Secondary | ICD-10-CM | POA: Diagnosis present

## 2022-11-12 DIAGNOSIS — Y901 Blood alcohol level of 20-39 mg/100 ml: Secondary | ICD-10-CM | POA: Diagnosis present

## 2022-11-12 DIAGNOSIS — F419 Anxiety disorder, unspecified: Secondary | ICD-10-CM | POA: Diagnosis present

## 2022-11-12 DIAGNOSIS — K5900 Constipation, unspecified: Secondary | ICD-10-CM | POA: Diagnosis present

## 2022-11-12 DIAGNOSIS — K760 Fatty (change of) liver, not elsewhere classified: Secondary | ICD-10-CM | POA: Diagnosis present

## 2022-11-12 DIAGNOSIS — T50902A Poisoning by unspecified drugs, medicaments and biological substances, intentional self-harm, initial encounter: Principal | ICD-10-CM | POA: Diagnosis present

## 2022-11-12 DIAGNOSIS — F1010 Alcohol abuse, uncomplicated: Secondary | ICD-10-CM | POA: Diagnosis present

## 2022-11-12 DIAGNOSIS — Z811 Family history of alcohol abuse and dependence: Secondary | ICD-10-CM | POA: Diagnosis not present

## 2022-11-12 DIAGNOSIS — K30 Functional dyspepsia: Secondary | ICD-10-CM | POA: Diagnosis present

## 2022-11-12 DIAGNOSIS — R4587 Impulsiveness: Secondary | ICD-10-CM | POA: Diagnosis present

## 2022-11-12 DIAGNOSIS — F332 Major depressive disorder, recurrent severe without psychotic features: Secondary | ICD-10-CM | POA: Diagnosis present

## 2022-11-12 DIAGNOSIS — K219 Gastro-esophageal reflux disease without esophagitis: Secondary | ICD-10-CM | POA: Diagnosis present

## 2022-11-12 DIAGNOSIS — J45909 Unspecified asthma, uncomplicated: Secondary | ICD-10-CM | POA: Diagnosis present

## 2022-11-12 LAB — RESP PANEL BY RT-PCR (RSV, FLU A&B, COVID)  RVPGX2
Influenza A by PCR: NEGATIVE
Influenza B by PCR: NEGATIVE
Resp Syncytial Virus by PCR: NEGATIVE
SARS Coronavirus 2 by RT PCR: NEGATIVE

## 2022-11-12 MED ORDER — HALOPERIDOL 5 MG PO TABS: 5.0000 mg | ORAL_TABLET | Freq: Three times a day (TID) | ORAL | Status: DC | PRN

## 2022-11-12 MED ORDER — ALUM & MAG HYDROXIDE-SIMETH 200-200-20 MG/5ML PO SUSP: 30.0000 mL | ORAL | Status: DC | PRN

## 2022-11-12 MED ORDER — LORAZEPAM 2 MG/ML IJ SOLN: 2.0000 mg | Freq: Three times a day (TID) | INTRAMUSCULAR | Status: DC | PRN

## 2022-11-12 MED ORDER — LORAZEPAM 1 MG PO TABS: 2.0000 mg | ORAL_TABLET | Freq: Three times a day (TID) | ORAL | Status: DC | PRN

## 2022-11-12 MED ORDER — DIPHENHYDRAMINE HCL 25 MG PO CAPS: 50.0000 mg | ORAL_CAPSULE | Freq: Three times a day (TID) | ORAL | Status: DC | PRN

## 2022-11-12 MED ORDER — MAGNESIUM HYDROXIDE 400 MG/5ML PO SUSP: 30.0000 mL | Freq: Every day | ORAL | Status: DC | PRN

## 2022-11-12 MED ORDER — DIPHENHYDRAMINE HCL 50 MG/ML IJ SOLN: 50.0000 mg | Freq: Three times a day (TID) | INTRAMUSCULAR | Status: DC | PRN

## 2022-11-12 MED ORDER — HYDROXYZINE HCL 25 MG PO TABS: 25.0000 mg | ORAL_TABLET | Freq: Three times a day (TID) | ORAL | Status: DC | PRN

## 2022-11-12 MED ORDER — HALOPERIDOL LACTATE 5 MG/ML IJ SOLN: 5.0000 mg | Freq: Three times a day (TID) | INTRAMUSCULAR | Status: DC | PRN

## 2022-11-12 MED ORDER — ACETAMINOPHEN 325 MG PO TABS
650.0000 mg | ORAL_TABLET | Freq: Four times a day (QID) | ORAL | Status: DC | PRN
  Administered 2022-11-13: 650 mg via ORAL
  Filled 2022-11-12: qty 2

## 2022-11-12 NOTE — ED Notes (Signed)
[facility] called stated they will be here at 12:15 to transport patient

## 2022-11-12 NOTE — ED Notes (Signed)
RN spoke with patient mother and gave follow up, clear understanding voiced that patient would be transported to [facility].

## 2022-11-12 NOTE — Progress Notes (Addendum)
Pt was accepted to [facility] 11/12/2022. Bed assignment: [id number]  Pt meets inpatient criteria per Charmaine Downs, NP  Attending Physician will be Janine Limbo, MD  Report can be called to: - Adult unit: [phone]  Bed is ready now  Care Team Notified: [facility] Lynnda Shields, RN, Crecencio Mc, RN, and Mal Amabile, RN  [place], [place]  11/12/2022 10:21 AM

## 2022-11-12 NOTE — Tx Team (Signed)
Initial Treatment Plan 11/12/2022 2:52 PM EDUAR COUZENS [id number]    PATIENT STRESSORS: Financial difficulties   Loss of relationship   Medication change or noncompliance     PATIENT STRENGTHS: Capable of independent living  Religious Affiliation  Supportive family/friends  Work skills    PATIENT IDENTIFIED PROBLEMS: Depression/Loss of relationship  Insomnia  Medication Adjustment                 DISCHARGE CRITERIA:  Improved stabilization in mood, thinking, and/or behavior  PRELIMINARY DISCHARGE PLAN: Return to previous living arrangement  PATIENT/FAMILY INVOLVEMENT: This treatment plan has been presented to and reviewed with the patient, Joshua Parrish. The patient has been given the opportunity to ask questions and make suggestions.  Melinda Crutch, RN 11/12/2022, 2:52 PM

## 2022-11-12 NOTE — Progress Notes (Signed)
Pt is under review at [facility] [place] per evening [facility] Clayborne Ansley, RN. [facility] has requested Covid/Flu/RSV test.  Pt meet inpatient criteria per Delfin Gant, NP.  Care Team notified: Leanord Asal, DO, Charmaine Downs, NP, Tora Kindred, RN, Night [facility] Lucia Gaskins, RN   Benjaman Kindler, MSW, [facility] 11/12/2022 12:42 AM

## 2022-11-12 NOTE — ED Notes (Signed)
RN attempted to call patient mother was not successful left VM.

## 2022-11-12 NOTE — ED Notes (Signed)
RN called report to [facility] and s/w [place], Therapist, sports

## 2022-11-12 NOTE — [facility] Group Notes (Signed)
[facility] Group Notes:  (Nursing/MHT/Case Management/Adjunct)  Date:  11/12/2022  Time:  9:16 PM  Type of Therapy:  Group Therapy  Participation Level:  Active  Participation Quality:  Appropriate  Affect:  Appropriate  Cognitive:  Appropriate  Insight:  Appropriate  Engagement in Group:  Engaged  Modes of Intervention:  Education  Summary of Progress/Problems: Participated in anger management exercise. Reports day was 9/10. Met goal.  Orvan Falconer 11/12/2022, 9:16 PM
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
## 2022-11-13 DIAGNOSIS — F332 Major depressive disorder, recurrent severe without psychotic features: Principal | ICD-10-CM | POA: Insufficient documentation

## 2022-11-13 DIAGNOSIS — F1010 Alcohol abuse, uncomplicated: Secondary | ICD-10-CM | POA: Diagnosis present

## 2022-11-13 MED ORDER — IRBESARTAN 150 MG PO TABS
300.0000 mg | ORAL_TABLET | Freq: Every day | ORAL | Status: DC
  Administered 2022-11-14 – 2022-11-16 (×3): 300 mg via ORAL
  Filled 2022-11-13: qty 4
  Filled 2022-11-13 (×2): qty 2
  Filled 2022-11-13: qty 1
  Filled 2022-11-13: qty 2
  Filled 2022-11-13: qty 1

## 2022-11-13 MED ORDER — TESTOSTERONE CYPIONATE 200 MG/ML IM SOLN
100.0000 mg | INTRAMUSCULAR | Status: DC
  Administered 2022-11-13: 100 mg via INTRAMUSCULAR
  Filled 2022-11-13: qty 1

## 2022-11-13 MED ORDER — PANTOPRAZOLE SODIUM 40 MG PO TBEC
40.0000 mg | DELAYED_RELEASE_TABLET | Freq: Every day | ORAL | Status: DC
  Administered 2022-11-13 – 2022-11-16 (×4): 40 mg via ORAL
  Filled 2022-11-13 (×6): qty 1

## 2022-11-13 MED ORDER — AMLODIPINE BESYLATE 10 MG PO TABS
10.0000 mg | ORAL_TABLET | Freq: Every day | ORAL | Status: DC
  Administered 2022-11-13 – 2022-11-16 (×4): 10 mg via ORAL
  Filled 2022-11-13 (×7): qty 1

## 2022-11-13 MED ORDER — HYDROCHLOROTHIAZIDE 25 MG PO TABS
25.0000 mg | ORAL_TABLET | Freq: Every day | ORAL | Status: DC
  Administered 2022-11-13 – 2022-11-16 (×4): 25 mg via ORAL
  Filled 2022-11-13 (×7): qty 1

## 2022-11-13 MED ORDER — VENLAFAXINE HCL ER 75 MG PO CP24
75.0000 mg | ORAL_CAPSULE | Freq: Every day | ORAL | Status: DC
  Administered 2022-11-14 – 2022-11-16 (×3): 75 mg via ORAL
  Filled 2022-11-13 (×5): qty 1

## 2022-11-13 MED ORDER — VALSARTAN-HYDROCHLOROTHIAZIDE 320-25 MG PO TABS: 1.0000 | ORAL_TABLET | Freq: Every day | ORAL | Status: DC

## 2022-11-13 MED ORDER — ALBUTEROL SULFATE HFA 108 (90 BASE) MCG/ACT IN AERS: 2.0000 | INHALATION_SPRAY | RESPIRATORY_TRACT | Status: DC | PRN

## 2022-11-13 MED ORDER — VENLAFAXINE HCL ER 37.5 MG PO CP24
37.5000 mg | ORAL_CAPSULE | Freq: Once | ORAL | Status: AC
  Administered 2022-11-13: 37.5 mg via ORAL
  Filled 2022-11-13 (×2): qty 1

## 2022-11-13 NOTE — Group Note (Signed)
Date:  11/13/2022 Time:  10:43 PM  Group Topic/Focus:  Wrap-Up Group:   The focus of this group is to help patients review their daily goal of treatment and discuss progress on daily workbooks.    Participation Level:  Active  Participation Quality:  Appropriate and Attentive  Affect:  Appropriate  Cognitive:  Alert and Appropriate  Insight: Appropriate and Good  Engagement in Group:  Engaged  Modes of Intervention:  Discussion and Education  Additional Comments:  Pt attended and participated in wrap up group this evening. In this group we discussed the patient's day, their goals and progress while in the facility.   Cristi Loron 11/13/2022, 10:43 PM
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
## 2022-11-13 NOTE — Group Note (Signed)
Date:  11/13/2022 Time:  9:53 AM  Group Topic/Focus:  Orientation:   The focus of this group is to educate the patient on the purpose and policies of crisis stabilization and provide a format to answer questions about their admission.  The group details unit policies and expectations of patients while admitted.    Participation Level:  Active  Participation Quality:  Appropriate  Affect:  Appropriate  Cognitive:  Appropriate  Insight: Appropriate  Engagement in Group:  Engaged  Modes of Intervention:  Discussion  Additional Comments:     Jerrye Beavers 11/13/2022, 9:53 AM
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
## 2022-11-13 NOTE — Group Note (Signed)
LCSW Group Therapy Note   Group Date: 11/13/2022 Start Time: 1100 End Time: 1200  Type of Group and Topic: Psychoeducational Group: Discharge Planning  Participation Level: Active  Description of Group Discharge planning group reviews patient's anticipated discharge plans and assists patients to anticipate and address any barriers to wellness/recovery in the community. Suicide prevention education is reviewed with patients in group.  Therapeutic Goals 1. Patients will state their anticipated discharge plan and mental health aftercare 2. Patients will identify potential barriers to wellness in the community setting 3. Patients will engage in problem solving, solution focused discussion of ways to anticipate and address barriers to wellness/recovery  Summary of Patient Progress: The Pt attended group and remained there the entire time.  The Pt accepted all worksheets and materials provided.  The Pt was appropriate with all peers and staff.  The Pt demonstrated understanding of the topic being discussed by sharing their thoughts and feeling about discharge and sharing community resources with their peers.    Darleen Crocker, LCSWA 11/13/2022  1:34 PM

## 2022-11-13 NOTE — Progress Notes (Signed)
   11/12/22 2100  Psych Admission Type (Psych Patients Only)  Admission Status Voluntary  Psychosocial Assessment  Patient Complaints Anxiety  Eye Contact Fair  Facial Expression Anxious;Worried  Affect Appropriate to circumstance  Catering manager Activity Slow  Appearance/Hygiene Unremarkable  Behavior Characteristics Cooperative;Appropriate to situation  Mood Pleasant;Anxious  Thought Process  Coherency WDL  Content WDL  Delusions None reported or observed  Perception WDL  Hallucination None reported or observed  Judgment Limited  Confusion None  Danger to Self  Current suicidal ideation? Denies  Agreement Not to Harm Self Yes  Description of Agreement Verbal  Danger to Others  Danger to Others None reported or observed

## 2022-11-13 NOTE — Progress Notes (Signed)
Patient stated he slept well last night although total reported sleep is 4 hours. Patient stated he was looking forward to speaking with the provider and get his medications "straightened out." Patient has been socializing appropriately and cooperative on unit. Patient denies SI,HI, and A/V/H with no plan or intent. Patient requested tylenol due to a stress headache since he found out his ex girlfriend was posting negative things about him on facebook and calling him crazy. Discussed helpful ways to cope and patient provided with reassurance.
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
## 2022-11-13 NOTE — Progress Notes (Signed)
   11/13/22 0639  15 Minute Checks  Location Hallway  Visual Appearance Calm  Behavior Composed  Sleep (Behavioral Health Patients Only)  Calculate sleep? (Click Yes once per 24 hr at 0600 safety check) Yes  Documented sleep last 24 hours 4

## 2022-11-13 NOTE — Plan of Care (Signed)
  Problem: Health Behavior/Discharge Planning: Goal: Compliance with treatment plan for underlying cause of condition will improve Outcome: Progressing   Problem: Safety: Goal: Periods of time without injury will increase 11/13/2022 0911 by Melinda Crutch, RN Outcome: Progressing 11/13/2022 0910 by Melinda Crutch, RN Outcome: Progressing   Problem: Safety: Goal: Ability to identify and utilize support systems that promote safety will improve Outcome: Progressing   Problem: Self-Concept: Goal: Level of anxiety will decrease Outcome: Progressing

## 2022-11-13 NOTE — Plan of Care (Signed)
  Problem: Education: Goal: Emotional status will improve Outcome: Progressing Goal: Mental status will improve Outcome: Progressing Goal: Verbalization of understanding the information provided will improve Outcome: Progressing   Problem: Safety: Goal: Periods of time without injury will increase Outcome: Progressing   Problem: Coping: Goal: Coping ability will improve Outcome: Progressing   

## 2022-11-14 ENCOUNTER — Encounter (HOSPITAL_COMMUNITY): Payer: Self-pay

## 2022-11-14 DIAGNOSIS — F332 Major depressive disorder, recurrent severe without psychotic features: Principal | ICD-10-CM

## 2022-11-14 MED ORDER — GABAPENTIN 300 MG PO CAPS
300.0000 mg | ORAL_CAPSULE | Freq: Three times a day (TID) | ORAL | Status: DC
  Administered 2022-11-14 – 2022-11-16 (×7): 300 mg via ORAL
  Filled 2022-11-14 (×12): qty 1

## 2022-11-14 MED ORDER — TESTOSTERONE CYPIONATE 200 MG/ML IM SOLN: 100.0000 mg | INTRAMUSCULAR | Status: DC

## 2022-11-14 NOTE — Progress Notes (Signed)
D: Pt denied SI/HI/AVH this morning. Pt rated his depression a 0/10, anxiety a 0/10, and feelings of hopelessness a 0/10. Pt reports that his goal for today is "I want to learn to talk to woman". Pt has been pleasant, calm, and cooperative throughout the shift.   A: RN provided support and encouragement to patient. Pt given scheduled medications as prescribed. Q15 min checks verified for safety.    R: Patient verbally contracts for safety. Patient compliant with medications and treatment plan. Patient is interacting well on the unit. Pt is safe on the unit.   11/14/22 0900  Psych Admission Type (Psych Patients Only)  Admission Status Voluntary  Psychosocial Assessment  Patient Complaints None  Eye Contact Fair  Facial Expression Anxious  Affect Appropriate to circumstance  Speech Logical/coherent  Interaction Assertive  Motor Activity Other (Comment) (WDL)  Appearance/Hygiene Unremarkable  Behavior Characteristics Cooperative;Appropriate to situation  Mood Anxious;Pleasant  Thought Process  Coherency WDL  Content WDL  Delusions None reported or observed  Perception WDL  Hallucination None reported or observed  Judgment Impaired  Confusion None  Danger to Self  Current suicidal ideation? Denies  Agreement Not to Harm Self Yes  Description of Agreement Verbal  Danger to Others  Danger to Others None reported or observed

## 2022-11-14 NOTE — Progress Notes (Signed)
Patient has been up and active on the unit, attended group this evening and has voiced no complaints. Patient currently denies having pain, -si/hi/a/v hall. Support and encouragement offered, safety maintained on unit, will continue to monitor.   11/14/22 2120  Psych Admission Type (Psych Patients Only)  Admission Status Voluntary  Psychosocial Assessment  Patient Complaints None  Eye Contact Fair  Facial Expression Anxious  Affect Anxious;Appropriate to circumstance  Speech Logical/coherent  Interaction Minimal  Motor Activity Other (Comment) (wnl)  Appearance/Hygiene Unremarkable  Behavior Characteristics Cooperative;Appropriate to situation  Mood Anxious;Pleasant  Thought Process  Coherency WDL  Content WDL  Delusions None reported or observed  Perception WDL  Hallucination None reported or observed  Judgment Poor  Confusion None  Danger to Self  Current suicidal ideation? Denies  Agreement Not to Harm Self Yes  Description of Agreement verbal  Danger to Others  Danger to Others None reported or observed

## 2022-11-14 NOTE — BHH Group Notes (Signed)
[place] Group Notes:  (Nursing/MHT/Case Management/Adjunct)  Date:  11/14/2022  Time:  11:09 AM  Type of Therapy:  Group Therapy  Participation Level:  Active  Participation Quality:  Appropriate  Affect:  Appropriate  Cognitive:  Appropriate  Insight:  Appropriate  Engagement in Group:  Engaged  Modes of Intervention:  Discussion  Summary of Progress/Problems:  Joshua Parrish 11/14/2022, 11:09 AM

## 2022-11-14 NOTE — Progress Notes (Signed)
   11/14/22 0530  15 Minute Checks  Location Bedroom  Visual Appearance Calm  Behavior Sleeping  Sleep (Behavioral Health Patients Only)  Calculate sleep? (Click Yes once per 24 hr at 0600 safety check) Yes  Documented sleep last 24 hours 8

## 2022-11-14 NOTE — Progress Notes (Signed)
  Patient alert and oriented x 4. Patient denies SI, HI, AVH at this time. Reports anxiety "2 or 3/10. Patient was worried that his ex-girlfriend went on Facebook to post about patient's mental health. He reported that a friend visited him  in the hospital and brought this to his notice. Staff provided emotional support and encouraged patient to focus on getting better. Patient verbally contracts for safety, Q 15 minutes safety checks ongoing.   11/13/22 2110  Psych Admission Type (Psych Patients Only)  Admission Status Voluntary  Psychosocial Assessment  Patient Complaints Anxiety  Eye Contact Fair  Facial Expression Anxious;Sad  Affect Appropriate to circumstance  Speech Logical/coherent  Interaction Assertive  Motor Activity Slow  Appearance/Hygiene Unremarkable  Behavior Characteristics Cooperative;Appropriate to situation  Mood Pleasant;Anxious  Thought Process  Coherency WDL  Content WDL  Delusions None reported or observed  Perception WDL  Hallucination None reported or observed  Judgment Limited  Confusion None  Danger to Self  Current suicidal ideation? Denies  Agreement Not to Harm Self Yes  Description of Agreement Verbal contract  Danger to Others  Danger to Others None reported or observed

## 2022-11-14 NOTE — BH IP Treatment Plan (Signed)
Interdisciplinary Treatment and Diagnostic Plan Update  11/14/2022 Time of Session: [place] MRN: [id number]  Principal Diagnosis: Major depressive disorder, recurrent episode, severe ([place])  Secondary Diagnoses: Principal Problem:   Major depressive disorder, recurrent episode, severe ([place]) Active Problems:   Acute drug overdose, intentional self-harm, initial encounter ([place])   Alcohol abuse   Current Medications:  Current Facility-Administered Medications  Medication Dose Route Frequency Provider Last Rate Last Admin   acetaminophen (TYLENOL) tablet 650 mg  650 mg Oral Q6H PRN Onuoha, Chinwendu V, NP   650 mg at 11/13/22 1329   albuterol (VENTOLIN HFA) 108 (90 Base) MCG/ACT inhaler 2 puff  2 puff Inhalation Q4H PRN Nwoko, Agnes I, NP       alum & mag hydroxide-simeth (MAALOX/MYLANTA) 200-200-20 MG/5ML suspension 30 mL  30 mL Oral Q4H PRN Onuoha, Chinwendu V, NP       amLODipine (NORVASC) tablet 10 mg  10 mg Oral Daily Nwoko, Herbert Pun I, NP   10 mg at 11/14/22 0753   diphenhydrAMINE (BENADRYL) capsule 50 mg  50 mg Oral TID PRN Onuoha, Chinwendu V, NP       Or   diphenhydrAMINE (BENADRYL) injection 50 mg  50 mg Intramuscular TID PRN Onuoha, Chinwendu V, NP       gabapentin (NEURONTIN) capsule 300 mg  300 mg Oral TID Lindell Spar I, NP   300 mg at 11/14/22 1142   haloperidol (HALDOL) tablet 5 mg  5 mg Oral TID PRN Onuoha, Chinwendu V, NP       Or   haloperidol lactate (HALDOL) injection 5 mg  5 mg Intramuscular TID PRN Onuoha, Chinwendu V, NP       irbesartan (AVAPRO) tablet 300 mg  300 mg Oral Daily Minda Ditto, RPH   300 mg at 11/14/22 [id number]   And   hydrochlorothiazide (HYDRODIURIL) tablet 25 mg  25 mg Oral Daily Minda Ditto, RPH   25 mg at 11/14/22 0753   LORazepam (ATIVAN) tablet 2 mg  2 mg Oral TID PRN Onuoha, Chinwendu V, NP       Or   LORazepam (ATIVAN) injection 2 mg  2 mg Intramuscular TID PRN Onuoha, Chinwendu V, NP       magnesium hydroxide (MILK OF MAGNESIA)  suspension 30 mL  30 mL Oral Daily PRN Onuoha, Chinwendu V, NP       pantoprazole (PROTONIX) EC tablet 40 mg  40 mg Oral QPC lunch Ranae Palms, MD   40 mg at 11/14/22 1301   venlafaxine XR (EFFEXOR-XR) 24 hr capsule 75 mg  75 mg Oral Q breakfast Lindell Spar I, NP   75 mg at 11/14/22 0753   PTA Medications: Medications Prior to Admission  Medication Sig Dispense Refill Last Dose   anastrozole (ARIMIDEX) 1 MG tablet Take 1 mg by mouth every Monday, Wednesday, and Friday.      buPROPion (WELLBUTRIN) 75 MG tablet Take 75 mg by mouth every morning.      escitalopram (LEXAPRO) 10 MG tablet Take 10 mg by mouth daily.      hydrOXYzine (VISTARIL) 25 MG capsule Take 25 mg by mouth 2 (two) times daily.      meloxicam (MOBIC) 15 MG tablet Take 15 mg by mouth daily as needed for pain.      methocarbamol (ROBAXIN) 500 MG tablet Take 500 mg by mouth 2 (two) times daily as needed for muscle spasms.      albuterol (VENTOLIN HFA) 108 (90 Base) MCG/ACT inhaler Inhale  2 puffs into the lungs every 4 (four) hours as needed for wheezing or shortness of breath. 6.7 g 0    amLODipine (NORVASC) 10 MG tablet TAKE 1 TABLET BY MOUTH DAILY 30 tablet 3    omeprazole (PRILOSEC) 40 MG capsule TAKE ONE CAPSULE BY MOUTH DAILY 90 capsule 3    testosterone cypionate (DEPOTESTOSTERONE CYPIONATE) 200 MG/ML injection Inject 100 mg into the muscle every Thursday.      valsartan-hydrochlorothiazide (DIOVAN-HCT) 320-25 MG tablet TAKE 1 TABLET BY MOUTH DAILY 30 tablet 3     Patient Stressors: Financial difficulties   Loss of relationship   Medication change or noncompliance    Patient Strengths: Capable of independent living  Religious Affiliation  Supportive family/friends  Work skills   Treatment Modalities: Medication Management, Group therapy, Case management,  1 to 1 session with clinician, Psychoeducation, Recreational therapy.   Physician Treatment Plan for Primary Diagnosis: Major depressive disorder, recurrent  episode, severe ([place]) Long Term Goal(s): Improvement in symptoms so as ready for discharge   Short Term Goals: Ability to identify and develop effective coping behaviors will improve Ability to maintain clinical measurements within normal limits will improve Compliance with prescribed medications will improve Ability to identify triggers associated with substance abuse/mental health issues will improve Ability to identify changes in lifestyle to reduce recurrence of condition will improve Ability to verbalize feelings will improve Ability to disclose and discuss suicidal ideas Ability to demonstrate self-control will improve  Medication Management: Evaluate patient's response, side effects, and tolerance of medication regimen.  Therapeutic Interventions: 1 to 1 sessions, Unit Group sessions and Medication administration.  Evaluation of Outcomes: Progressing  Physician Treatment Plan for Secondary Diagnosis: Principal Problem:   Major depressive disorder, recurrent episode, severe ([place]) Active Problems:   Acute drug overdose, intentional self-harm, initial encounter ([place])   Alcohol abuse  Long Term Goal(s): Improvement in symptoms so as ready for discharge   Short Term Goals: Ability to identify and develop effective coping behaviors will improve Ability to maintain clinical measurements within normal limits will improve Compliance with prescribed medications will improve Ability to identify triggers associated with substance abuse/mental health issues will improve Ability to identify changes in lifestyle to reduce recurrence of condition will improve Ability to verbalize feelings will improve Ability to disclose and discuss suicidal ideas Ability to demonstrate self-control will improve     Medication Management: Evaluate patient's response, side effects, and tolerance of medication regimen.  Therapeutic Interventions: 1 to 1 sessions, Unit Group sessions and Medication  administration.  Evaluation of Outcomes: Progressing   RN Treatment Plan for Primary Diagnosis: Major depressive disorder, recurrent episode, severe ([place]) Long Term Goal(s): Knowledge of disease and therapeutic regimen to maintain health will improve  Short Term Goals: Ability to remain free from injury will improve, Ability to verbalize frustration and anger appropriately will improve, Ability to demonstrate self-control, Ability to participate in decision making will improve, Ability to verbalize feelings will improve, Ability to disclose and discuss suicidal ideas, Ability to identify and develop effective coping behaviors will improve, and Compliance with prescribed medications will improve  Medication Management: RN will administer medications as ordered by provider, will assess and evaluate patient's response and provide education to patient for prescribed medication. RN will report any adverse and/or side effects to prescribing provider.  Therapeutic Interventions: 1 on 1 counseling sessions, Psychoeducation, Medication administration, Evaluate responses to treatment, Monitor vital signs and CBGs as ordered, Perform/monitor CIWA, COWS, AIMS and Fall Risk screenings as ordered, Perform wound care  treatments as ordered.  Evaluation of Outcomes: Progressing   LCSW Treatment Plan for Primary Diagnosis: Major depressive disorder, recurrent episode, severe ([place]) Long Term Goal(s): Safe transition to appropriate next level of care at discharge, Engage patient in therapeutic group addressing interpersonal concerns.  Short Term Goals: Engage patient in aftercare planning with referrals and resources, Increase social support, Increase ability to appropriately verbalize feelings, Increase emotional regulation, Facilitate acceptance of mental health diagnosis and concerns, Facilitate patient progression through stages of change regarding substance use diagnoses and concerns, Identify triggers  associated with mental health/substance abuse issues, and Increase skills for wellness and recovery  Therapeutic Interventions: Assess for all discharge needs, 1 to 1 time with Social worker, Explore available resources and support systems, Assess for adequacy in community support network, Educate family and significant other(s) on suicide prevention, Complete Psychosocial Assessment, Interpersonal group therapy.  Evaluation of Outcomes: Progressing   Progress in Treatment: Attending groups: Yes. Participating in groups: Yes. Taking medication as prescribed: Yes. Toleration medication: Yes. Family/Significant other contact made: Yes, individual(s) contacted:  Nyjah Stirling [phone] (Mother Patient understands diagnosis: Yes. Discussing patient identified problems/goals with staff: Yes. Medical problems stabilized or resolved: Yes. Denies suicidal/homicidal ideation: Yes. Issues/concerns per patient self-inventory: Yes. Other:   New problem(s) identified: No, Describe:  None Reported  New Short Term/Long Term Goal(s): ): medication stabilization, elimination of SI thoughts, development of comprehensive mental wellness plan.   Patient Goals:  Coping Skills/Medication Stabilization  Discharge Plan or Barriers:   Reason for Continuation of Hospitalization: Anxiety Depression Medication stabilization Suicidal ideation Withdrawal symptoms  Estimated Length of Stay: 3-7 Days  Last 3 [place] Suicide Severity Risk Score: Flowsheet Row Admission (Current) from 11/12/2022 in [place] [facility] ED from 11/11/2022 in [facility] at [facility] ED from 09/30/2022 in [facility] at [place] No Risk High Risk No Risk       Last PHQ 2/9 Scores:    10/14/2022    8:30 AM 08/12/2022    9:15 AM 02/25/2022   11:17 AM  Depression screen PHQ 2/9  Decreased Interest 0 2 0  Down,  Depressed, Hopeless 0 2 0  PHQ - 2 Score 0 4 0  Altered sleeping  2   Tired, decreased energy  0   Change in appetite  2   Feeling bad or failure about yourself   2   Trouble concentrating  2   Moving slowly or fidgety/restless  0   Suicidal thoughts  2   PHQ-9 Score  14   Difficult doing work/chores  Somewhat difficult    detox, medication management for mood stabilization; elimination of SI thoughts; development of comprehensive mental wellness/sobriety plan  Scribe for Treatment Team: Windle Guard, LCSW 11/14/2022 1:51 PM

## 2022-11-14 NOTE — Group Note (Signed)
Recreation Therapy Group Note   Group Topic:Problem Solving  Group Date: 11/14/2022 Start Time: 0935 End Time: 1005 Facilitators: Artez Regis-McCall, LRT,CTRS Location: 300 [address] Dayroom   Goal Area(s) Addresses:  Patient will verbalize importance of using appropriate problem solving techniques.  Patient will identify positive change associated with effective problem solving skills.   Group Description:  Trivia.  In celebration of [place]. Patrick's Day, LRT engaged with group with St. Patrick's Day trivia.  LRT would read the questions and patients were given 10 seconds to write their responses down.  LRT would then give the group the correct answer.    Affect/Mood: Appropriate   Participation Level: Engaged   Participation Quality: Independent   Behavior: Appropriate   Speech/Thought Process: Focused   Insight: Good   Judgement: Good   Modes of Intervention: Trivia   Patient Response to Interventions:  Engaged   Education Outcome:  Acknowledges education and In group clarification offered    Clinical Observations/Individualized Feedback: Pt was focused and engaged.  Pt had a good sense of humor with some of the questions.  Pt was attentive to coming up with the correct answers to the questions asked.   Plan: Continue to engage patient in RT group sessions 2-3x/week.   Amara Manalang-McCall, LRT,CTRS 11/14/2022 1:26 PM

## 2022-11-14 NOTE — BHH Suicide Risk Assessment (Signed)
[place] INPATIENT:  Family/Significant Other Suicide Prevention Education  Suicide Prevention Education:  Education Completed; Joshua Parrish [phone] (Mother) has been identified by the patient as the family member/significant other with whom the patient will be residing, and identified as the person(s) who will aid the patient in the event of a mental health crisis (suicidal ideations/suicide attempt).  With written consent from the patient, the family member/significant other has been provided the following suicide prevention education, prior to the and/or following the discharge of the patient.  The suicide prevention education provided includes the following: Suicide risk factors Suicide prevention and interventions National Suicide Hotline telephone number [facility] assessment telephone number [facility] Emergency Assistance [place] and/or Residential Mobile Crisis Unit telephone number  Request made of family/significant other to: Remove weapons (e.g., guns, rifles, knives), all items previously/currently identified as safety concern.   Remove drugs/medications (over-the-counter, prescriptions, illicit drugs), all items previously/currently identified as a safety concern.  The family member/significant other verbalizes understanding of the suicide prevention education information provided.  The family member/significant other agrees to remove the items of safety concern listed above.  CSW spoke with Joshua Parrish who states that her son has his own apartment and confirms that he can return there after discharge.  She states that her son had his brother's firearms in the home but that she removed these items after he came to the hospital.  She state that the firearms are now locked in a safe at her home and that her son does not have access to them.  Joshua Parrish states that "this all happened after his ex-girlfriend left him and he became obsessed with her".  She states  "he sent text messages to multiple people saying goodbye but I am not sure that he even took the pills".  She states "I feel like this may have been an attempt to get the ex-girlfriend's attention".  Joshua Parrish states that she will continue to be a support for her son after discharge and will assist with outpatient appointments and medications.  CSW completed SPE with Joshua Parrish.   Joshua Parrish 11/14/2022, 11:27 AM

## 2022-11-14 NOTE — Progress Notes (Signed)
[facility] MD Progress Note  11/14/2022 8:52 AM Joshua Parrish  MRN:  [id number]  Reason for admission: 47 year old male with prior hx of major depressive disorder. Admitted to the [facility] from the [facility] ED with complain of suicide attempt by overdose on 38 tablets of hydroxyzine & a glass of rum. Chart review indicated that patient stated at the ED she took that amount of hydroxyzine with a glass of rum to help him sleep. And after he was unable to sleep, he contacted a [place] virtual provider to get some help for sleep. When the virtual doctor realized what patient had done, he called 911 & he was brought to the ED for evaluation & treatments. A review of his lab results indicated a BAL of 23.   Daily notes: Brye is seen. Chart reviewed. The chart findings discussed with the treatment team. He presents alert, oriented x 4. He is visible on the unit, attending group sessions. He presents with an improving affect, good eye contact & verbally responsive. He reports, "I'm doing better. I'm actually calm today, feeling relaxed & happy. I slept like a log last night. I'm gradually getting back to my baseline/pattern of my normal self now that I'm off the hydroxyzine. Please, let no one try to put me on that medicine ever again. Since being here, my mind is no longer racing, this was what was happening to me at home when I was taking that hydroxyzine medicine". Joshua Parrish currently denies any SIHI, AVH, delusional thoughts or paranoia. He does not appear to be responding to any internal stimuli. Reviewed vital signs, stable. The staff continues to titrate his treatment regimen to assure his mental health stability is restored prior to discharge. Will continue as already in progress.  Principal Problem: Major depressive disorder, recurrent episode, severe ([place])  Diagnosis: Principal Problem:   Major depressive disorder, recurrent episode, severe ([facility]) Active Problems:   Acute drug overdose, intentional  self-harm, initial encounter ([place])   Alcohol abuse  Total Time spent with patient:  35 minutes  Past Psychiatric History: See H&P  Past Medical History:  Past Medical History:  Diagnosis Date   Anxiety    Chicken pox    Depression    Fatty liver    GERD (gastroesophageal reflux disease)    H/O hernia repair 03/31/2022   Hypertension    Migraines    Osteoarthritis of shoulder    left   Rotator cuff impingement syndrome of left shoulder 06/19/2015   Sinusitis    Sleep apnea     Past Surgical History:  Procedure Laterality Date   APPENDECTOMY     CERVICAL LAMINECTOMY     has plates and fusion   CHOLECYSTECTOMY N/A 12/24/2015   Procedure: LAPAROSCOPIC CHOLECYSTECTOMY ;  Surgeon: [name] Pickerel, MD;  Location: [facility];  Service: General;  Laterality: N/A;   CLAVICLE EXCISION Left    HYPOSPADIAS CORRECTION     as child   KNEE SURGERY Bilateral    multiple   SHOULDER ARTHROSCOPY WITH ROTATOR CUFF REPAIR Left 06/26/2015   Procedure: SHOULDER ARTHROSCOPY WITH ROTATOR CUFF REPAIR;  Surgeon: Elsie Saas, MD;  Location: [facility];  Service: Orthopedics;  Laterality: Left;   Family History:  Family History  Problem Relation Age of Onset   Arthritis Mother    Ovarian cancer Mother    Alcohol abuse Father    Breast cancer Maternal Uncle    Breast cancer Maternal Grandmother    Cancer Maternal Grandmother  spinal surgery   Cancer Other    Hypertension Other    Diabetes Other    Breast cancer Maternal Great-grandfather    Colon cancer Neg Hx    Sleep apnea Neg Hx    Family Psychiatric  History: See H&P.  Social History:  Social History   Substance and Sexual Activity  Alcohol Use Yes   Alcohol/week: 0.0 standard drinks of alcohol   Comment: occasional     Social History   Substance and Sexual Activity  Drug Use No    Social History   Socioeconomic History   Marital status: Divorced    Spouse name: Not on file   Number of children: 1    Years of education: 12   Highest education level: Not on file  Occupational History   Occupation: Garage Interior and spatial designer   Tobacco Use   Smoking status: Never   Smokeless tobacco: Current    Types: Chew   Tobacco comments:    form given 11/29/15  Vaping Use   Vaping Use: Never used  Substance and Sexual Activity   Alcohol use: Yes    Alcohol/week: 0.0 standard drinks of alcohol    Comment: occasional   Drug use: No   Sexual activity: Yes  Other Topics Concern   Not on file  Social History Narrative   Fun: Lift weights, hike   Social Determinants of Health   Financial Resource Strain: Not on file  Food Insecurity: No Food Insecurity (11/12/2022)   Hunger Vital Sign    Worried About Running Out of Food in the Last Year: Never true    Ran Out of Food in the Last Year: Never true  Transportation Needs: No Transportation Needs (11/12/2022)   PRAPARE - Hydrologist (Medical): No    Lack of Transportation (Non-Medical): No  Physical Activity: Not on file  Stress: Not on file  Social Connections: Not on file   Additional Social History:   Sleep: Good  Appetite:  Good  Current Medications: Current Facility-Administered Medications  Medication Dose Route Frequency Provider Last Rate Last Admin   acetaminophen (TYLENOL) tablet 650 mg  650 mg Oral Q6H PRN Onuoha, Chinwendu V, NP   650 mg at 11/13/22 1329   albuterol (VENTOLIN HFA) 108 (90 Base) MCG/ACT inhaler 2 puff  2 puff Inhalation Q4H PRN Alysha Doolan I, NP       alum & mag hydroxide-simeth (MAALOX/MYLANTA) 200-200-20 MG/5ML suspension 30 mL  30 mL Oral Q4H PRN Onuoha, Chinwendu V, NP       amLODipine (NORVASC) tablet 10 mg  10 mg Oral Daily Maisy Newport, Herbert Pun I, NP   10 mg at 11/14/22 0753   diphenhydrAMINE (BENADRYL) capsule 50 mg  50 mg Oral TID PRN Onuoha, Chinwendu V, NP       Or   diphenhydrAMINE (BENADRYL) injection 50 mg  50 mg Intramuscular TID PRN Onuoha, Chinwendu V, NP       haloperidol  (HALDOL) tablet 5 mg  5 mg Oral TID PRN Onuoha, Chinwendu V, NP       Or   haloperidol lactate (HALDOL) injection 5 mg  5 mg Intramuscular TID PRN Onuoha, Chinwendu V, NP       irbesartan (AVAPRO) tablet 300 mg  300 mg Oral Daily Massengill, Nathan, MD   300 mg at 11/14/22 0753   And   hydrochlorothiazide (HYDRODIURIL) tablet 25 mg  25 mg Oral Daily Massengill, Ovid Curd, MD   25 mg at 11/14/22 [phone]  hydrOXYzine (ATARAX) tablet 25 mg  25 mg Oral TID PRN Onuoha, Chinwendu V, NP       LORazepam (ATIVAN) tablet 2 mg  2 mg Oral TID PRN Onuoha, Chinwendu V, NP       Or   LORazepam (ATIVAN) injection 2 mg  2 mg Intramuscular TID PRN Onuoha, Chinwendu V, NP       magnesium hydroxide (MILK OF MAGNESIA) suspension 30 mL  30 mL Oral Daily PRN Onuoha, Chinwendu V, NP       pantoprazole (PROTONIX) EC tablet 40 mg  40 mg Oral QPC lunch Ranae Palms, MD   40 mg at 11/13/22 1142   testosterone cypionate (DEPOTESTOSTERONE CYPIONATE) injection 100 mg  100 mg Intramuscular Q Thu Avant Printy I, NP   100 mg at 11/13/22 1723   venlafaxine XR (EFFEXOR-XR) 24 hr capsule 75 mg  75 mg Oral Q breakfast Lindell Spar I, NP   75 mg at 11/14/22 0753   Lab Results: No results found for this or any previous visit (from the past 48 hour(s)).  Blood Alcohol level:  Lab Results  Component Value Date   ETH 23 (H) [id number]   Metabolic Disorder Labs: Lab Results  Component Value Date   HGBA1C 5.4 10/14/2022   MPG 111 09/20/2021   MPG 111 08/21/2009   No results found for: "PROLACTIN" Lab Results  Component Value Date   CHOL 229 (H) 04/19/2021   TRIG 359.0 (H) 04/19/2021   HDL 34.30 (L) 04/19/2021   CHOLHDL 7 04/19/2021   VLDL 71.8 (H) 04/19/2021   Physical Findings: AIMS:  , ,  ,  ,    CIWA:    COWS:     Musculoskeletal: Strength & Muscle Tone: within normal limits Gait & Station: normal Patient leans: N/A  Psychiatric Specialty Exam:  Presentation  General Appearance:  Casual; Fairly  Groomed  Eye Contact: Good  Speech: Clear and Coherent; Normal Rate  Speech Volume: Normal  Handedness: Right  Mood and Affect  Mood: Depressed; Anxious  Affect: Congruent  Thought Process  Thought Processes: Coherent; Goal Directed  Descriptions of Associations:Intact  Orientation:Full (Time, Place and Person)  Thought Content:Logical  History of Schizophrenia/Schizoaffective disorder:No data recorded Duration of Psychotic Symptoms:No data recorded Hallucinations:Hallucinations: None  Ideas of Reference:None  Suicidal Thoughts:Suicidal Thoughts: No  Homicidal Thoughts:Homicidal Thoughts: No  Sensorium  Memory: Remote Good; Recent Good; Immediate Good  Judgment: Fair  Insight: Fair  Materials engineer: Fair  Attention Span: Fair  Recall: [employer] of Knowledge: Fair  Language: Fair  Psychomotor Activity  Psychomotor Activity: Psychomotor Activity: -- (See overly happy.)  Assets  Assets: Communication Skills; Desire for Improvement; Financial Resources/Insurance; Housing; Resilience; Physical Health  Sleep  Sleep: Sleep: Good Number of Hours of Sleep: 7  Physical Exam: Physical Exam Vitals and nursing note reviewed.  HENT:     Head: Normocephalic.     Nose: Nose normal.     Mouth/Throat:     Pharynx: Oropharynx is clear.  Eyes:     Pupils: Pupils are equal, round, and reactive to light.  Cardiovascular:     Rate and Rhythm: Normal rate.     Pulses: Normal pulses.  Pulmonary:     Effort: Pulmonary effort is normal.  Genitourinary:    Comments: Deferred Musculoskeletal:        General: Normal range of motion.     Cervical back: Normal range of motion.  Skin:    General: Skin is warm and dry.  Neurological:     General: No focal deficit present.     Mental Status: He is alert and oriented to person, place, and time.    Review of Systems  Constitutional:  Negative for chills, diaphoresis and fever.   HENT:  Negative for congestion and sore throat.   Eyes:  Negative for blurred vision.  Respiratory:  Negative for cough, shortness of breath and wheezing.   Cardiovascular:  Negative for chest pain and palpitations.  Gastrointestinal:  Negative for abdominal pain, constipation, diarrhea, heartburn, nausea and vomiting.  Genitourinary:  Negative for dysuria.  Musculoskeletal:  Negative for joint pain and myalgias.  Skin:  Negative for itching and rash.  Neurological:  Negative for dizziness, tingling, tremors, sensory change, speech change, focal weakness, seizures, loss of consciousness, weakness and headaches.  Endo/Heme/Allergies:        See allergy lists.  Psychiatric/Behavioral:  Positive for depression (Improving.) and substance abuse (Hx alcohol abuse). Negative for hallucinations, memory loss and suicidal ideas. The patient has insomnia (Improving). The patient is not nervous/anxious.    Blood pressure 137/87, pulse 74, temperature 97.8 F (36.6 C), resp. rate 18, height 5\' 8"  (1.727 m), weight 127.9 kg, SpO2 97 %. Body mass index is 42.88 kg/m.  Treatment Plan Summary: Daily contact with patient to assess and evaluate symptoms and progress in treatment and Medication management.   Continue inpatient hospitalization.  Will continue today 11/14/2022 plan as below except where it is noted.   Principal/active diagnoses. Major depressive disorder, recurrent episode, severe ([place]).  Alcohol use disorder, mild.   Associated symptoms. Anxiety issues.  Insomnia.  Impulsivity.  Plan:  -Completed Effexor-XR 37.5 mg po x once for depression.  -Continue Effexor XR 75 mg po daily for depression (start 11-13-21). -Continue Hydroxyzine 25 mg po tid prn for anxiety.    Agitation protocols: Cont as recommended;  -Benadryl 50 mg po or IM tid prn. -Haldol 5 mg po or IM tid prn.  -Lorazepam 2 mg po or IM tid prn.   Other medical issues.  Resumed Amlodipine 10 mg po daily for HTN.   Resumed Testosterone 100 mg IM Q Thursdays.  Resumed Diovan-HCTZ 320-25 mg po daily for HTN.  Continue Protonix 40 mg po Q am for GERD.  Resumed Albuterol inhaler 2 puff Q 4 hrs prn for SOB.   Other PRNS -Continue Tylenol 650 mg every 6 hours PRN for mild pain -Continue Maalox 30 ml Q 4 hrs PRN for indigestion -Continue MOM 30 ml po Q 6 hrs for constipation   Safety and Monitoring: Voluntary admission to inpatient psychiatric unit for safety, stabilization and treatment Daily contact with patient to assess and evaluate symptoms and progress in treatment Patient's case to be discussed in multi-disciplinary team meeting Observation Level : q15 minute checks Vital signs: q12 hours Precautions: Safety   Discharge Planning: Social work and case management to assist with discharge planning and identification of hospital follow-up needs prior to discharge Estimated LOS: 5-7 days Discharge Concerns: Need to establish a safety plan; Medication compliance and effectiveness Discharge Goals: Return home with outpatient referrals for mental health follow-up including medication management/psychotherapy  Lindell Spar, NP, pmhnp, fnp-bc 11/14/2022, 8:52 AM

## 2022-11-14 NOTE — BHH Group Notes (Signed)
[facility] Group Notes:  (Nursing/MHT/Case Management/Adjunct)  Date:  11/14/2022  Time:  8:24 PM  Type of Therapy:   AA  Participation Level:  Active  Participation Quality:  Appropriate  Affect:  Appropriate  Cognitive:  Appropriate  Insight:  Appropriate  Engagement in Group:  Engaged  Modes of Intervention:  Education  Summary of Progress/Problems: Attended AA group.  Orvan Falconer 11/14/2022, 8:24 PM

## 2022-11-14 NOTE — Plan of Care (Signed)
  Problem: Education: Goal: Emotional status will improve Outcome: Progressing Goal: Mental status will improve Outcome: Progressing Goal: Verbalization of understanding the information provided will improve Outcome: Progressing   Problem: Safety: Goal: Periods of time without injury will increase Outcome: Progressing   Problem: Coping: Goal: Coping ability will improve Outcome: Progressing   

## 2022-11-14 NOTE — Plan of Care (Signed)
°  Problem: Education: °Goal: Emotional status will improve °Outcome: Progressing °Goal: Mental status will improve °Outcome: Progressing °  °Problem: Activity: °Goal: Interest or engagement in activities will improve °Outcome: Progressing °  °

## 2022-11-15 NOTE — Progress Notes (Signed)

## 2022-11-15 NOTE — Progress Notes (Signed)
   11/15/22 2000  Psych Admission Type (Psych Patients Only)  Admission Status Voluntary  Psychosocial Assessment  Patient Complaints None  Eye Contact Fair  Facial Expression Anxious  Affect Anxious  Speech Logical/coherent  Interaction Assertive  Motor Activity Other (Comment) (WNL)  Appearance/Hygiene Unremarkable  Behavior Characteristics Cooperative;Appropriate to situation  Mood Anxious;Pleasant  Thought Process  Coherency WDL  Content WDL  Delusions None reported or observed  Perception WDL  Hallucination None reported or observed  Judgment Impaired  Confusion None  Danger to Self  Current suicidal ideation? Denies  Agreement Not to Harm Self Yes  Description of Agreement Verbal  Danger to Others  Danger to Others None reported or observed   Alert/oriented. Makes needs/concerns known to staff. Pleasant cooperative with staff. Denies SI/HI/A/V hallucinations. Patient states went to group. Will encourage continue compliance and progression towards goals. Verbally contracted for safety. Will continue to monitor.

## 2022-11-15 NOTE — Plan of Care (Signed)
  Problem: Safety: Goal: Periods of time without injury will increase Outcome: Progressing   

## 2022-11-15 NOTE — Progress Notes (Signed)
   11/15/22 0557  15 Minute Checks  Location Bedroom  Visual Appearance Calm  Behavior Sleeping  Sleep (Behavioral Health Patients Only)  Calculate sleep? (Click Yes once per 24 hr at 0600 safety check) Yes  Documented sleep last 24 hours 7.5

## 2022-11-15 NOTE — Group Note (Signed)
Date:  11/15/2022 Time:  11:51 AM  Group Topic/Focus:  Goals Group:   The focus of this group is to help patients establish daily goals to achieve during treatment and discuss how the patient can incorporate goal setting into their daily lives to aide in recovery. Orientation:   The focus of this group is to educate the patient on the purpose and policies of crisis stabilization and provide a format to answer questions about their admission.  The group details unit policies and expectations of patients while admitted.    Participation Level:  Active  Participation Quality:  Appropriate  Affect:  Appropriate  Cognitive:  Appropriate  Insight: Appropriate  Engagement in Group:  Engaged  Modes of Intervention:  Discussion  Additional Comments:    Joshua Parrish 11/15/2022, 11:51 AM

## 2022-11-15 NOTE — Progress Notes (Signed)
[facility] MD Progress Note  11/15/2022 9:02 AM GRANTLEY STAN  MRN:  [id number]  Reason for admission: Tanveer A. Somma is a 47 year old male with past psychiatric history of major depressive disorder. Admitted to the [facility] from the [facility] ED with complain of suicide attempt by overdose on 38 tablets of hydroxyzine & a glass of rum. Chart review indicated that patient stated at the ED she took that amount of hydroxyzine with a glass of rum to help him sleep. And after he was unable to sleep, he contacted a [place] virtual provider to get some help for sleep. When the virtual doctor realized what patient had done, he called 911 & he was brought to the ED for evaluation & treatments. A review of his lab results indicated a BAL of 23.   24 hour chart review: Staff report patient has been visible in milieu attending unit activities and groups. Medication compliant. Vital signs remains WNL. No PRNs given. Staff report patient is compliant with no behavioral or management issues noted.   Assessment: Patient assessed where he presents alert, oriented x 4. He remains visible on the unit, attending group sessions and activities. Euthymic mood, congruent affect. Maintains good eye contact & verbally responsive. He reports, "I'm doing much better now. Great. I've been outside. I've been talking to my friends. Getting the negative people out of my life. I'm going to be alright'. He reports recently realizing a woman in his life was 'not for him' and being upset at first but having a break here at [facility] has helped him 'put a lot into perspective about what and who really matters'. Reports improved sleep. He endorses strong friend group who he identifies as his primary support system including his mother. He reports living alone with his cats who he refers to as his 'babies' and works full time as a Paramedic. He reports symptoms exacerbated by Hydroxyzine in which he states led to 'mind racing and unable to  sleep'. He currently denies any SI/HI/AVH, delusional thoughts or paranoia. No obvious signs of responding to any external/internal stimuli. Plan noted below with no expected changes today.    Principal Problem: Major depressive disorder, recurrent episode, severe ([place])  Diagnosis: Principal Problem:   Major depressive disorder, recurrent episode, severe ([place]) Active Problems:   Acute drug overdose, intentional self-harm, initial encounter ([place])   Alcohol abuse  Total Time spent with patient:  35 minutes  Past Psychiatric History: See H&P  Past Medical History:  Past Medical History:  Diagnosis Date   Anxiety    Chicken pox    Depression    Fatty liver    GERD (gastroesophageal reflux disease)    H/O hernia repair 03/31/2022   Hypertension    Migraines    Osteoarthritis of shoulder    left   Rotator cuff impingement syndrome of left shoulder 06/19/2015   Sinusitis    Sleep apnea     Past Surgical History:  Procedure Laterality Date   APPENDECTOMY     CERVICAL LAMINECTOMY     has plates and fusion   CHOLECYSTECTOMY N/A 12/24/2015   Procedure: LAPAROSCOPIC CHOLECYSTECTOMY ;  Surgeon: [name] Pickerel, MD;  Location: [facility];  Service: General;  Laterality: N/A;   CLAVICLE EXCISION Left    HYPOSPADIAS CORRECTION     as child   KNEE SURGERY Bilateral    multiple   SHOULDER ARTHROSCOPY WITH ROTATOR CUFF REPAIR Left 06/26/2015   Procedure: SHOULDER ARTHROSCOPY WITH ROTATOR  CUFF REPAIR;  Surgeon: Elsie Saas, MD;  Location: [facility];  Service: Orthopedics;  Laterality: Left;   Family History:  Family History  Problem Relation Age of Onset   Arthritis Mother    Ovarian cancer Mother    Alcohol abuse Father    Breast cancer Maternal Uncle    Breast cancer Maternal Grandmother    Cancer Maternal Grandmother        spinal surgery   Cancer Other    Hypertension Other    Diabetes Other    Breast cancer Maternal Great-grandfather    Colon cancer Neg Hx     Sleep apnea Neg Hx    Family Psychiatric  History: See H&P.  Social History:  Social History   Substance and Sexual Activity  Alcohol Use Yes   Alcohol/week: 0.0 standard drinks of alcohol   Comment: occasional     Social History   Substance and Sexual Activity  Drug Use No    Social History   Socioeconomic History   Marital status: Divorced    Spouse name: Not on file   Number of children: 1   Years of education: 12   Highest education level: Not on file  Occupational History   Occupation: Garage Interior and spatial designer   Tobacco Use   Smoking status: Never   Smokeless tobacco: Current    Types: Chew   Tobacco comments:    form given 11/29/15  Vaping Use   Vaping Use: Never used  Substance and Sexual Activity   Alcohol use: Yes    Alcohol/week: 0.0 standard drinks of alcohol    Comment: occasional   Drug use: No   Sexual activity: Yes  Other Topics Concern   Not on file  Social History Narrative   Fun: Lift weights, hike   Social Determinants of Health   Financial Resource Strain: Not on file  Food Insecurity: No Food Insecurity (11/12/2022)   Hunger Vital Sign    Worried About Running Out of Food in the Last Year: Never true    Ran Out of Food in the Last Year: Never true  Transportation Needs: No Transportation Needs (11/12/2022)   PRAPARE - Hydrologist (Medical): No    Lack of Transportation (Non-Medical): No  Physical Activity: Not on file  Stress: Not on file  Social Connections: Not on file   Additional Social History:   Sleep: Good  Appetite:  Good  Current Medications: Current Facility-Administered Medications  Medication Dose Route Frequency Provider Last Rate Last Admin   acetaminophen (TYLENOL) tablet 650 mg  650 mg Oral Q6H PRN Onuoha, Chinwendu V, NP   650 mg at 11/13/22 1329   albuterol (VENTOLIN HFA) 108 (90 Base) MCG/ACT inhaler 2 puff  2 puff Inhalation Q4H PRN Nwoko, Agnes I, NP       alum & mag  hydroxide-simeth (MAALOX/MYLANTA) 200-200-20 MG/5ML suspension 30 mL  30 mL Oral Q4H PRN Onuoha, Chinwendu V, NP       amLODipine (NORVASC) tablet 10 mg  10 mg Oral Daily Nwoko, Herbert Pun I, NP   10 mg at 11/15/22 0815   diphenhydrAMINE (BENADRYL) capsule 50 mg  50 mg Oral TID PRN Onuoha, Chinwendu V, NP       Or   diphenhydrAMINE (BENADRYL) injection 50 mg  50 mg Intramuscular TID PRN Onuoha, Chinwendu V, NP       gabapentin (NEURONTIN) capsule 300 mg  300 mg Oral TID Encarnacion Slates, NP   300  mg at 11/15/22 0815   haloperidol (HALDOL) tablet 5 mg  5 mg Oral TID PRN Onuoha, Chinwendu V, NP       Or   haloperidol lactate (HALDOL) injection 5 mg  5 mg Intramuscular TID PRN Onuoha, Chinwendu V, NP       irbesartan (AVAPRO) tablet 300 mg  300 mg Oral Daily Minda Ditto, RPH   300 mg at 11/15/22 [id number]   And   hydrochlorothiazide (HYDRODIURIL) tablet 25 mg  25 mg Oral Daily Minda Ditto, RPH   25 mg at 11/15/22 0815   LORazepam (ATIVAN) tablet 2 mg  2 mg Oral TID PRN Onuoha, Chinwendu V, NP       Or   LORazepam (ATIVAN) injection 2 mg  2 mg Intramuscular TID PRN Onuoha, Chinwendu V, NP       magnesium hydroxide (MILK OF MAGNESIA) suspension 30 mL  30 mL Oral Daily PRN Onuoha, Chinwendu V, NP       pantoprazole (PROTONIX) EC tablet 40 mg  40 mg Oral QPC lunch Ranae Palms, MD   40 mg at 11/14/22 1301   venlafaxine XR (EFFEXOR-XR) 24 hr capsule 75 mg  75 mg Oral Q breakfast Lindell Spar I, NP   75 mg at 11/15/22 [id number]   Lab Results: No results found for this or any previous visit (from the past 76 hour(s)).  Blood Alcohol level:  Lab Results  Component Value Date   ETH 23 (H) [id number]   Metabolic Disorder Labs: Lab Results  Component Value Date   HGBA1C 5.4 10/14/2022   MPG 111 09/20/2021   MPG 111 08/21/2009   No results found for: "PROLACTIN" Lab Results  Component Value Date   CHOL 229 (H) 04/19/2021   TRIG 359.0 (H) 04/19/2021   HDL 34.30 (L) 04/19/2021   CHOLHDL 7 04/19/2021    VLDL 71.8 (H) 04/19/2021   Physical Findings: AIMS:  , ,  ,  ,    CIWA:    COWS:     Musculoskeletal: Strength & Muscle Tone: within normal limits Gait & Station: normal Patient leans: N/A  Psychiatric Specialty Exam:  Presentation  General Appearance:  Casual; Fairly Groomed  Eye Contact: Good  Speech: Clear and Coherent; Normal Rate  Speech Volume: Normal  Handedness: Right  Mood and Affect  Mood: Depressed; Anxious  Affect: Congruent  Thought Process  Thought Processes: Coherent; Goal Directed  Descriptions of Associations:Intact  Orientation:Full (Time, Place and Person)  Thought Content:Logical  History of Schizophrenia/Schizoaffective disorder:No data recorded Duration of Psychotic Symptoms:No data recorded Hallucinations:No data recorded  Ideas of Reference:None  Suicidal Thoughts:No data recorded  Homicidal Thoughts:No data recorded  Sensorium  Memory: Remote Good; Recent Good; Immediate Good  Judgment: Fair  Insight: Fair  Materials engineer: Fair  Attention Span: Fair  Recall: [employer] of Knowledge: Fair  Language: Fair  Psychomotor Activity  Psychomotor Activity: No data recorded  Assets  Assets: Communication Skills; Desire for Improvement; Financial Resources/Insurance; Housing; Resilience; Physical Health  Sleep  Sleep: No data recorded  Physical Exam: Physical Exam Vitals and nursing note reviewed.  HENT:     Head: Normocephalic.     Nose: Nose normal.     Mouth/Throat:     Pharynx: Oropharynx is clear.  Eyes:     Pupils: Pupils are equal, round, and reactive to light.  Cardiovascular:     Rate and Rhythm: Normal rate.     Pulses: Normal pulses.  Pulmonary:  Effort: Pulmonary effort is normal.  Genitourinary:    Comments: Deferred Musculoskeletal:        General: Normal range of motion.     Cervical back: Normal range of motion.  Skin:    General: Skin is warm and  dry.  Neurological:     General: No focal deficit present.     Mental Status: He is alert and oriented to person, place, and time.    Review of Systems  Constitutional:  Negative for chills, diaphoresis and fever.  HENT:  Negative for congestion and sore throat.   Eyes:  Negative for blurred vision.  Respiratory:  Negative for cough, shortness of breath and wheezing.   Cardiovascular:  Negative for chest pain and palpitations.  Gastrointestinal:  Negative for abdominal pain, constipation, diarrhea, heartburn, nausea and vomiting.  Genitourinary:  Negative for dysuria.  Musculoskeletal:  Negative for joint pain and myalgias.  Skin:  Negative for itching and rash.  Neurological:  Negative for dizziness, tingling, tremors, sensory change, speech change, focal weakness, seizures, loss of consciousness, weakness and headaches.  Endo/Heme/Allergies:        See allergy lists.  Psychiatric/Behavioral:  Positive for depression (Improving.) and substance abuse (Hx alcohol abuse). Negative for hallucinations, memory loss and suicidal ideas. The patient has insomnia (Improving). The patient is not nervous/anxious.    Blood pressure 129/89, pulse 73, temperature 98.3 F (36.8 C), temperature source Oral, resp. rate 18, height 5\' 8"  (1.727 m), weight 127.9 kg, SpO2 97 %. Body mass index is 42.88 kg/m.  Treatment Plan Summary: Daily contact with patient to assess and evaluate symptoms and progress in treatment and Medication management.   Continue inpatient hospitalization.  Will continue today 11/15/2022 plan as below except where it is noted.   Principal/active diagnoses. Major depressive disorder, recurrent episode, severe ([place]).  Alcohol use disorder, mild.   Associated symptoms. Anxiety issues.  Insomnia.  Impulsivity.  Plan:  Medications: -Continue:   -Effexor XR 75 mg po daily for depression increased from 37.5 mg 11/14/22 -Hydroxyzine 25 mg po tid prn for anxiety.    Other  PRNs -Tylenol 650 mg every 6 hours PRN for mild pain -Maalox 30 ml Q 4 hrs PRN for indigestion -MOM 30 ml po Q 6 hrs for constipation -Agitation protocol: Benadryl 50 mg PO/IM, Haldol 5 mg PO/IM, Lorazepam 2 mg PO/IM TID PRN   Other Medical Issues.  -Amlodipine 10 mg po daily for HTN.  -Testosterone 100 mg IM Q Thursdays.  -Diovan-HCTZ 320-25 mg po daily for HTN.  -Protonix 40 mg po Q am for GERD.  -Albuterol inhaler 2 puff Q 4 hrs prn for SOB.    Safety and Monitoring: Voluntary admission to inpatient psychiatric unit for safety, stabilization and treatment Daily contact with patient to assess and evaluate symptoms and progress in treatment Patient's case to be discussed in multi-disciplinary team meeting Observation Level : q15 minute checks Vital signs: q12 hours Precautions: Safety   Discharge Planning: Social work and case management to assist with discharge planning and identification of hospital follow-up needs prior to discharge Estimated LOS: 5-7 days Discharge Concerns: Need to establish a safety plan; Medication compliance and effectiveness Discharge Goals: Return home with outpatient referrals for mental health follow-up including medication management/psychotherapy  Inda Merlin, NP, pmhnp, fnp-bc 11/15/2022, 9:02 AMPatient ID: [name], male   DOB: December 28, 1975, 47 y.o.   MRN: [id number]

## 2022-11-15 NOTE — Group Note (Signed)
Date:  11/15/2022 Time:  4:26 PM  Group Topic/Focus:  Self Care:   The focus of this group is to help patients understand the importance of self-care in order to improve or restore emotional, physical, spiritual, interpersonal, and financial health.    Participation Level:  Active  Participation Quality:  Appropriate  Affect:  Appropriate  Cognitive:  Appropriate  Insight: Appropriate  Engagement in Group:  Engaged  Modes of Intervention:  Discussion  Additional Comments:    Garvin Fila 11/15/2022, 4:26 PM

## 2022-11-15 NOTE — Progress Notes (Signed)
Standard Group Notes:  (Nursing/MHT/Case Management/Adjunct)  Date:  11/15/2022  Time:  2000  Type of Therapy:   wrap up group  Participation Level:  Active  Participation Quality:  Appropriate, Attentive, Sharing, and Supportive  Affect:  Appropriate  Cognitive:  Alert  Insight:  Improving  Engagement in Group:  Engaged  Modes of Intervention:  Clarification, Education, and Support  Summary of Progress/Problems: Positive thinking and positive change were discussed.   Joshua Parrish 11/15/2022, 8:57 PM

## 2022-11-16 MED ORDER — PANTOPRAZOLE SODIUM 40 MG PO TBEC: 40.0000 mg | DELAYED_RELEASE_TABLET | Freq: Every day | ORAL | 0 refills | Status: DC

## 2022-11-16 MED ORDER — GABAPENTIN 300 MG PO CAPS: 300.0000 mg | ORAL_CAPSULE | Freq: Three times a day (TID) | ORAL | 0 refills | Status: DC

## 2022-11-16 MED ORDER — VENLAFAXINE HCL ER 75 MG PO CP24: 75.0000 mg | ORAL_CAPSULE | Freq: Every day | ORAL | 0 refills | Status: DC

## 2022-11-16 NOTE — Progress Notes (Signed)
  [facility] Adult Case Management Discharge Plan :  Will you be returning to the same living situation after discharge:  Yes,  home At discharge, do you have transportation home?: Yes,  mother will be picking patient up Do you have the ability to pay for your medications: Yes,  insurance   Release of information consent forms completed and in the chart;  Patient's signature needed at discharge.  Patient to Follow up at:  Follow-up Information     [facility]. Schedule an appointment as soon as possible for a visit.   Why: Per provider request, please schedule an appointment with Clemens Catholic (Psychiatry) and Darnelle Bos for therapy services. Contact information: [address], [place], [place] [address]  Phone: [phone]                Next level of care provider has access to [place] and Suicide Prevention discussed: Velta Addison  Tokuo Abajian [phone] (Mother)     Has patient been referred to the Quitline?: Patient refused referral  Patient has been referred for addiction treatment: [place], LCSW 11/16/2022, 2:06 PM

## 2022-11-16 NOTE — Progress Notes (Signed)
Discharge note: RN met with pt and reviewed pt's discharge instructions. Pt verbalized understanding of discharge instructions and pt did not have any questions. RN reviewed and provided pt with a copy of Suicide Safety Plan, SRA, AVS and Transition Record. RN returned pt's belongings to pt. Paper medication prescriptions were given to pt. Pt denied SI/HI/AVH and voiced no concerns. Pt was appreciative of the care pt received at [facility]. Patient discharged to the lobby without incident.

## 2022-11-16 NOTE — BHH Suicide Risk Assessment (Signed)
Suicide Risk Assessment  Discharge Assessment    [facility] Discharge Suicide Risk Assessment   Principal Problem: Major depressive disorder, recurrent episode, severe ([place]) Discharge Diagnoses: Principal Problem:   Major depressive disorder, recurrent episode, severe ([place]) Active Problems:   Acute drug overdose, intentional self-harm, initial encounter ([place])   Alcohol abuse  Joshua Parrish was admitted for Major depressive disorder, recurrent episode, severe ([place]) and crisis management.  He was treated with the following medications see MAR.  Joshua Parrish was discharged with current medication and was instructed on how to take medications as prescribed; (details listed below under Medication List).  Medical problems were identified and treated as needed.  Home medications were restarted as appropriate.  Improvement was monitored by observation and Joshua Parrish daily report of symptom reduction.  Emotional and mental status was monitored by daily self-inventory reports completed by Joshua Parrish and clinical staff.         Joshua Parrish was evaluated by the treatment team for stability and plans for continued recovery upon discharge.  Joshua Parrish motivation was an integral factor for scheduling further treatment.  Employment, transportation, bed availability, health status, family support, and any pending legal issues were also considered during his hospital stay.  He was offered further treatment options upon discharge including but not limited to Residential, Intensive Outpatient, and Outpatient treatment.  Joshua Parrish will follow up with the services as listed below under Follow Up Information.     Upon completion of this admission the Joshua Parrish was both mentally and medically stable for discharge denying suicidal/homicidal ideation, auditory/visual/tactile hallucinations, delusional thoughts and paranoia.     Total Time spent with patient: 45 minutes  Musculoskeletal: Strength & Muscle Tone: within normal  limits Gait & Station: normal Patient leans: N/A  Psychiatric Specialty Exam  Presentation  General Appearance:  Casual; Fairly Groomed  Eye Contact: Good  Speech: Clear and Coherent; Normal Rate  Speech Volume: Normal  Handedness: Right   Mood and Affect  Mood: Depressed; Anxious  Duration of Depression Symptoms: No data recorded Affect: Congruent   Thought Process  Thought Processes: Coherent; Goal Directed  Descriptions of Associations:Intact  Orientation:Full (Time, Place and Person)  Thought Content:Logical  History of Schizophrenia/Schizoaffective disorder:No data recorded Duration of Psychotic Symptoms:No data recorded Hallucinations:No data recorded Ideas of Reference:None  Suicidal Thoughts:No data recorded Homicidal Thoughts:No data recorded  Sensorium  Memory: Remote Good; Recent Good; Immediate Good  Judgment: Fair  Insight: Fair   Community education officer  Concentration: Fair  Attention Span: Fair  Recall: [employer] of Knowledge: Fair  Language: Fair   Psychomotor Activity  Psychomotor Activity:No data recorded  Assets  Assets: Communication Skills; Desire for Improvement; Financial Resources/Insurance; Housing; Resilience; Physical Health   Sleep  Sleep:No data recorded  Physical Exam: Physical Exam Vitals and nursing note reviewed.  Constitutional:      Appearance: He is obese. He is not ill-appearing.  HENT:     Head: Normocephalic.     Nose: Nose normal.     Mouth/Throat:     Mouth: Mucous membranes are moist.     Pharynx: Oropharynx is clear.  Eyes:     Pupils: Pupils are equal, round, and reactive to light.  Cardiovascular:     Rate and Rhythm: Normal rate.     Pulses: Normal pulses.  Pulmonary:     Effort: Pulmonary effort is normal.     Breath sounds: Normal breath sounds.  Abdominal:  Palpations: Abdomen is soft.  Musculoskeletal:        General: Normal range of motion.     Cervical  back: Normal range of motion.  Skin:    General: Skin is warm and dry.  Neurological:     Mental Status: He is alert and oriented to person, place, and time.  Psychiatric:        Attention and Perception: He does not perceive auditory or visual hallucinations.        Mood and Affect: Mood and affect normal. Mood is not depressed.        Speech: Speech normal.        Behavior: Behavior normal. Behavior is cooperative.        Thought Content: Thought content normal. Thought content is not paranoid or delusional. Thought content does not include homicidal or suicidal ideation. Thought content does not include homicidal or suicidal plan.        Cognition and Memory: Cognition and memory normal.        Judgment: Judgment is not impulsive or inappropriate.    Review of Systems  Psychiatric/Behavioral:  Negative for depression, hallucinations, substance abuse and suicidal ideas.   All other systems reviewed and are negative.  Blood pressure 122/71, pulse 67, temperature 97.9 F (36.6 C), temperature source Oral, resp. rate 20, height 5\' 8"  (1.727 m), weight 127.9 kg, SpO2 97 %. Body mass index is 42.88 kg/m.  Mental Status Per Nursing Assessment::   On Admission:  Self-harm behaviors  Demographic Factors:  Male, Divorced or widowed, Caucasian, and Living alone  Loss Factors: Loss of significant relationship  Historical Factors: Impulsivity  Risk Reduction Factors:   Sense of responsibility to family, Religious beliefs about death, Employed, and Positive social support  Continued Clinical Symptoms:  Previous Psychiatric Diagnoses and Treatments  Cognitive Features That Contribute To Risk:  None    Suicide Risk:  Moderate:  Frequent suicidal ideation with limited intensity, and duration, some specificity in terms of plans, no associated intent, good self-control, limited dysphoria/symptomatology, some risk factors present, and identifiable protective factors, including available  and accessible social support.   Follow-up Information     [facility]. Schedule an appointment as soon as possible for a visit.   Why: Per provider request, please schedule an appointment with Clemens Catholic (Psychiatry) and Darnelle Bos for therapy services. Contact information: [address], [place], [place] [address]  Phone: [phone]                Plan Of Care/Follow-up recommendations:  Activity: As tolerated  Diet: Heart healthy  Other: -Follow-up with your outpatient psychiatric provider -instructions on appointment date, time, and address (location) are provided to you in discharge paperwork.   -Take your psychiatric medications as prescribed at discharge - instructions are provided to you in the discharge paperwork.    -Follow-up with outpatient primary care doctor and other specialists -for management of preventative medicine and chronic medical disease, including:   -Testing: Follow-up with outpatient provider for abnormal lab results:  none   -Recommend abstinence from alcohol, tobacco, and other illicit drug use at discharge.    -If your psychiatric symptoms recur, worsen, or if you have side effects to your psychiatric medications, call your outpatient psychiatric provider, 911, [phone] or go to the nearest emergency department.   -If suicidal thoughts recur, call your outpatient psychiatric provider, 911, [phone] or go to the nearest emergency department.  Inda Merlin, NP 11/16/2022, 1:30 PM

## 2022-11-16 NOTE — Progress Notes (Signed)
   11/16/22 0546  15 Minute Checks  Location Bedroom  Visual Appearance Calm  Behavior Sleeping  Sleep (Behavioral Health Patients Only)  Calculate sleep? (Click Yes once per 24 hr at 0600 safety check) Yes  Documented sleep last 24 hours 8

## 2022-11-16 NOTE — BHH Group Notes (Signed)
[place] Group Notes:  (Nursing/MHT/Case Management/Adjunct)  Date:  11/16/2022  Time:  9:09 AM  Type of Therapy:   Goals group  Participation Level:  Active  Participation Quality:  Attentive  Affect:  Appropriate  Cognitive:  Appropriate  Insight:  Appropriate  Engagement in Group:  Engaged  Modes of Intervention:  Orientation  Summary of Progress/Problems: Goal is to be more social  Joshua Parrish 11/16/2022, 9:09 AM

## 2022-11-16 NOTE — Discharge Summary (Signed)
Physician Discharge Summary Note  Patient:  Joshua Parrish is an 47 y.o., male MRN:  [id number] DOB:  Aug 15, 1976 Patient phone:  [phone] (home)  Patient address:   [address] [place] [address],  Total Time spent with patient: 1 hour  Date of Admission:  11/12/2022 Date of Discharge: 11/16/2022  Reason for Admission: Joshua Parrish is a 47 year old male with past psychiatric history of major depressive disorder. Ad[facility] with complain of suicide attempt by overdose on 38 tablets of hydroxyzine & a glass of rum. Chart review indicated that patient stated at the ED she took that amount of hydroxyzine with a glass of rum to help him sleep. And after he was unable to sleep, he contacted a [place] virtual provider to get some help for sleep. When the virtual doctor realized what patient had done, he called 911 & he was brought to the ED for evaluation & treatments. A review of his lab results indicated a BAL of 23.    24 hour chart review: Staff report patient has been visible in milieu attending unit activities and groups. Pleasant affect. Engaging with peers and staff appropriately. Medication compliant. Vital signs remains WNL. No PRNs given. Staff report patient is compliant with no behavioral or management issues noted.    Assessment: Patient assessed where he presents alert, oriented x 4. He remains visible on the unit, attending group sessions and activities. Euthymic mood, congruent affect. Maintains good eye contact & verbally responsive. He reports feeling 'really good'. Denies any SI/HI/AVH. Feels symptoms prior to admission were intensified due to medications and some alcohol use denies any history of 'deep depression' or suicidal ideations otherwise. He endorsing 'strong' friend group, good relationship with his mother, and his 'love' for his job as his protective factors. He described recent break-up 'hit me' and I wasn't  handling it well but once she heard I was in here and showed me she doesn't care about me, I'm done with it. I don't see a point in caring about somebody who doesn't care about you. No love lost'. He contracts for safety and provides permission to speak to his mother and friend Joshua Parrish for collateral information and safety planning.   Collateral:  Joshua Parrish (mother) [phone] Reports recently speaking with patient yesterday and reports patient allegedly called friend week before while drinking reporting 'the same thing'. Mom states she doesn't believe patient actually took medications but did it more so for attention from his most recent girlfriend who recently broke up with him. Denies any history of suicide attempts of self injurious behaviors but reports patient has 'issues being alone'. Mom reports she lives about 10 minutes away from patient's apartment and has key; denies any safety concerns and feels patient 'needed this to see how she (ex-girlfriend) really felt about him'.   Joshua Parrish (close friend) [phone] Reports speaking to patient regularly prior to hospitalization including night of incident. States he doesn't believe patient actually took medication but said he did so more for attention of ex-girlfriend. Describes patient as 'pretty level headed' but was drinking night of incident. Denies any knowledge of suicide attempts of self injurious behaviors. Feels patient is safe for discharge. Denies any safety concerns.   Principal Problem: Major depressive disorder, recurrent episode, severe ([place]) Discharge Diagnoses: Principal Problem:   Major depressive disorder, recurrent episode, severe ([place]) Active Problems:   Acute drug overdose, intentional self-harm, initial encounter ([place])  Alcohol abuse   Past Psychiatric History: see H&P  Past Medical History:  Past Medical History:  Diagnosis Date   Anxiety    Chicken pox    Depression    Fatty liver    GERD  (gastroesophageal reflux disease)    H/O hernia repair 03/31/2022   Hypertension    Migraines    Osteoarthritis of shoulder    left   Rotator cuff impingement syndrome of left shoulder 06/19/2015   Sinusitis    Sleep apnea     Past Surgical History:  Procedure Laterality Date   APPENDECTOMY     CERVICAL LAMINECTOMY     has plates and fusion   CHOLECYSTECTOMY N/A 12/24/2015   Procedure: LAPAROSCOPIC CHOLECYSTECTOMY ;  Surgeon: [name] Pickerel, MD;  Location: [facility];  Service: General;  Laterality: N/A;   CLAVICLE EXCISION Left    HYPOSPADIAS CORRECTION     as child   KNEE SURGERY Bilateral    multiple   SHOULDER ARTHROSCOPY WITH ROTATOR CUFF REPAIR Left 06/26/2015   Procedure: SHOULDER ARTHROSCOPY WITH ROTATOR CUFF REPAIR;  Surgeon: Elsie Saas, MD;  Location: [facility];  Service: Orthopedics;  Laterality: Left;   Family History:  Family History  Problem Relation Age of Onset   Arthritis Mother    Ovarian cancer Mother    Alcohol abuse Father    Breast cancer Maternal Uncle    Breast cancer Maternal Grandmother    Cancer Maternal Grandmother        spinal surgery   Cancer Other    Hypertension Other    Diabetes Other    Breast cancer Maternal Great-grandfather    Colon cancer Neg Hx    Sleep apnea Neg Hx    Family Psychiatric History: see H&P Social History:  Social History   Substance and Sexual Activity  Alcohol Use Yes   Alcohol/week: 0.0 standard drinks of alcohol   Comment: occasional     Social History   Substance and Sexual Activity  Drug Use No    Social History   Socioeconomic History   Marital status: Divorced    Spouse name: Not on file   Number of children: 1   Years of education: 12   Highest education level: Not on file  Occupational History   Occupation: Garage Interior and spatial designer   Tobacco Use   Smoking status: Never   Smokeless tobacco: Current    Types: Chew   Tobacco comments:    form given 11/29/15  Vaping Use   Vaping  Use: Never used  Substance and Sexual Activity   Alcohol use: Yes    Alcohol/week: 0.0 standard drinks of alcohol    Comment: occasional   Drug use: No   Sexual activity: Yes  Other Topics Concern   Not on file  Social History Narrative   Fun: Lift weights, hike   Social Determinants of Health   Financial Resource Strain: Not on file  Food Insecurity: No Food Insecurity (11/12/2022)   Hunger Vital Sign    Worried About Running Out of Food in the Last Year: Never true    Ran Out of Food in the Last Year: Never true  Transportation Needs: No Transportation Needs (11/12/2022)   PRAPARE - Hydrologist (Medical): No    Lack of Transportation (Non-Medical): No  Physical Activity: Not on file  Stress: Not on file  Social Connections: Not on file    Hospital Course:   Joshua Parrish was admitted  for Major depressive disorder, recurrent episode, severe ([place]) and crisis management.  He was treated with the following medications see MAR.  Joshua Parrish was discharged with current medication and was instructed on how to take medications as prescribed; (details listed below under Medication List).  Medical problems were identified and treated as needed.  Home medications were restarted as appropriate.   Improvement was monitored by observation and Joshua Parrish daily report of symptom reduction.  Emotional and mental status was monitored by daily self-inventory reports completed by Joshua Parrish and clinical staff.         Joshua Parrish was evaluated by the treatment team for stability and plans for continued recovery upon discharge.  Joshua Parrish motivation was an integral factor for scheduling further treatment.  Employment, transportation, bed availability, health status, family support, and any pending legal issues were also considered during his hospital stay.  He was offered further treatment options upon discharge including but not limited to Residential, Intensive Outpatient, and  Outpatient treatment.  Joshua Parrish will follow up with the services as listed below under Follow Up Information.      Upon completion of this admission the Joshua MISKIEWICZ was both mentally and medically stable for discharge denying suicidal/homicidal ideation, auditory/visual/tactile hallucinations, delusional thoughts and paranoia.   Physical Findings: AIMS:  , ,  ,  ,    CIWA:    COWS:     Musculoskeletal: Strength & Muscle Tone: within normal limits Gait & Station: normal Patient leans: N/A   Psychiatric Specialty Exam:  Presentation  General Appearance:  Casual; Fairly Groomed  Eye Contact: Good  Speech: Clear and Coherent; Normal Rate  Speech Volume: Normal  Handedness: Right   Mood and Affect  Mood: Depressed; Anxious  Affect: Congruent   Thought Process  Thought Processes: Coherent; Goal Directed  Descriptions of Associations:Intact  Orientation:Full (Time, Place and Person)  Thought Content:Logical  History of Schizophrenia/Schizoaffective disorder:No data recorded Duration of Psychotic Symptoms:No data recorded Hallucinations:No data recorded Ideas of Reference:None  Suicidal Thoughts:No data recorded Homicidal Thoughts:No data recorded  Sensorium  Memory: Remote Good; Recent Good; Immediate Good  Judgment: Fair  Insight: Fair   Community education officer  Concentration: Fair  Attention Span: Fair  Recall: [employer] of Knowledge: Fair  Language: Fair   Psychomotor Activity  Psychomotor Activity:No data recorded  Assets  Assets: Communication Skills; Desire for Improvement; Financial Resources/Insurance; Housing; Resilience; Physical Health   Sleep  Sleep:No data recorded   Physical Exam: Physical Exam Vitals and nursing note reviewed.  Constitutional:      Appearance: He is obese.  HENT:     Head: Normocephalic.  Neurological:     Mental Status: He is alert and oriented to person, place, and time.  Psychiatric:         Attention and Perception: Attention and perception normal.        Mood and Affect: Mood and affect normal.        Speech: Speech normal.        Behavior: Behavior normal. Behavior is cooperative.        Thought Content: Thought content normal.        Cognition and Memory: Cognition and memory normal.        Judgment: Judgment normal.    Review of Systems  Endo/Heme/Allergies:  Negative for environmental allergies.  Psychiatric/Behavioral:  Negative for depression, hallucinations, substance abuse and suicidal ideas.   All other systems reviewed and are  negative.  Blood pressure 122/71, pulse 67, temperature 97.9 F (36.6 C), temperature source Oral, resp. rate 20, height 5\' 8"  (1.727 m), weight 127.9 kg, SpO2 97 %. Body mass index is 42.88 kg/m.   Social History   Tobacco Use  Smoking Status Never  Smokeless Tobacco Current   Types: Chew  Tobacco Comments   form given 11/29/15   Tobacco Cessation:  N/A, patient does not currently use tobacco products   Blood Alcohol level:  Lab Results  Component Value Date   ETH 23 (H) [id number]    Metabolic Disorder Labs:  Lab Results  Component Value Date   HGBA1C 5.4 10/14/2022   MPG 111 09/20/2021   MPG 111 08/21/2009   No results found for: "PROLACTIN" Lab Results  Component Value Date   CHOL 229 (H) 04/19/2021   TRIG 359.0 (H) 04/19/2021   HDL 34.30 (L) 04/19/2021   CHOLHDL 7 04/19/2021   VLDL 71.8 (H) 04/19/2021    See Psychiatric Specialty Exam and Suicide Risk Assessment completed by Attending Physician prior to discharge.  Discharge destination:  Home  Is patient on multiple antipsychotic therapies at discharge:  No   Has Patient had three or more failed trials of antipsychotic monotherapy by history:  No  Recommended Plan for Multiple Antipsychotic Therapies: NA  Discharge Instructions     Diet - low sodium heart healthy   Complete by: As directed    Increase activity slowly   Complete by: As  directed       Allergies as of 11/16/2022       Reactions   Silicone Rash   Dermabond   Coconut Oil Diarrhea, Nausea And Vomiting, Other (See Comments)   Allergic to coconuts   Lisinopril Cough        Medication List     STOP taking these medications    anastrozole 1 MG tablet Commonly known as: ARIMIDEX   buPROPion 75 MG tablet Commonly known as: WELLBUTRIN   escitalopram 10 MG tablet Commonly known as: LEXAPRO       TAKE these medications      Indication  albuterol 108 (90 Base) MCG/ACT inhaler Commonly known as: VENTOLIN HFA Inhale 2 puffs into the lungs every 4 (four) hours as needed for wheezing or shortness of breath.  Indication: Asthma   amLODipine 10 MG tablet Commonly known as: NORVASC TAKE 1 TABLET BY MOUTH DAILY  Indication: High Blood Pressure Disorder   gabapentin 300 MG capsule Commonly known as: NEURONTIN Take 1 capsule (300 mg total) by mouth 3 (three) times daily.  Indication: Anxiety/pain   hydrOXYzine 25 MG capsule Commonly known as: VISTARIL Take 25 mg by mouth 2 (two) times daily.  Indication: Feeling Anxious   meloxicam 15 MG tablet Commonly known as: MOBIC Take 15 mg by mouth daily as needed for pain.  Indication: Joint Damage causing Pain and Loss of Function   methocarbamol 500 MG tablet Commonly known as: ROBAXIN Take 500 mg by mouth 2 (two) times daily as needed for muscle spasms.  Indication: Musculoskeletal Pain   omeprazole 40 MG capsule Commonly known as: PRILOSEC TAKE ONE CAPSULE BY MOUTH DAILY  Indication: Gastroesophageal Reflux Disease   pantoprazole 40 MG tablet Commonly known as: PROTONIX Take 1 tablet (40 mg total) by mouth daily after lunch. Start taking on: November 17, 2022  Indication: Gastroesophageal Reflux Disease   testosterone cypionate 200 MG/ML injection Commonly known as: DEPOTESTOSTERONE CYPIONATE Inject 100 mg into the muscle every Thursday.  Indication: Deficient Activity  of the Testis    valsartan-hydrochlorothiazide 320-25 MG tablet Commonly known as: DIOVAN-HCT TAKE 1 TABLET BY MOUTH DAILY  Indication: High Blood Pressure Disorder   venlafaxine XR 75 MG 24 hr capsule Commonly known as: EFFEXOR-XR Take 1 capsule (75 mg total) by mouth daily with breakfast. Start taking on: November 17, 2022  Indication: Major Depressive Disorder        Follow-up Information     [facility]. Schedule an appointment as soon as possible for a visit.   Why: Per provider request, please schedule an appointment with Joshua Parrish (Psychiatry) and Joshua Parrish for therapy services. Contact information: [address], [place], [place] [address]  Phone: [phone]                Follow-up recommendations:   -Follow-up with your outpatient psychiatric provider -instructions on appointment date, time, and address (location) are provided to you in discharge paperwork.   -Take your psychiatric medications as prescribed at discharge - instructions are provided to you in the discharge paperwork.    -Follow-up with outpatient primary care doctor and other specialists -for management of preventative medicine and chronic medical disease, including:   -Testing: Follow-up with outpatient provider for abnormal lab results:  none   -Recommend abstinence from alcohol, tobacco, and other illicit drug use at discharge.    -If your psychiatric symptoms recur, worsen, or if you have side effects to your psychiatric medications, call your outpatient psychiatric provider, 911, [phone] or go to the nearest emergency department.   -If suicidal thoughts recur, call your outpatient psychiatric provider, 911, [phone] or go to the nearest emergency department.  Comments:    Signed: Inda Merlin, NP 11/16/2022, 1:52 PM

## 2022-11-20 ENCOUNTER — Telehealth: Payer: Managed Care, Other (non HMO) | Admitting: Nurse Practitioner

## 2022-11-20 DIAGNOSIS — G4700 Insomnia, unspecified: Secondary | ICD-10-CM | POA: Diagnosis not present

## 2022-11-20 NOTE — Progress Notes (Signed)
Virtual Visit Consent   Joshua Parrish, you are scheduled for a virtual visit with a [facility] provider today. Just as with appointments in the office, your consent must be obtained to participate. Your consent will be active for this visit and any virtual visit you may have with one of our providers in the next 365 days. If you have a MyChart account, a copy of this consent can be sent to you electronically.  As this is a virtual visit, video technology does not allow for your provider to perform a traditional examination. This may limit your provider's ability to fully assess your condition. If your provider identifies any concerns that need to be evaluated in person or the need to arrange testing (such as labs, EKG, etc.), we will make arrangements to do so. Although advances in technology are sophisticated, we cannot ensure that it will always work on either your end or our end. If the connection with a video visit is poor, the visit may have to be switched to a telephone visit. With either a video or telephone visit, we are not always able to ensure that we have a secure connection.  By engaging in this virtual visit, you consent to the provision of healthcare and authorize for your insurance to be billed (if applicable) for the services provided during this visit. Depending on your insurance coverage, you may receive a charge related to this service.  I need to obtain your verbal consent now. Are you willing to proceed with your visit today? Joshua Parrish has provided verbal consent on 11/20/2022 for a virtual visit (video or telephone). Joshua Schneiders, FNP  Date: 11/20/2022 7:53 AM  Virtual Visit via Video Note   I, Joshua Parrish, connected with  Joshua Parrish  ([id number], [birth year]) on 11/20/22 at  8:00 AM EDT by a video-enabled telemedicine application and verified that I am speaking with the correct person using two identifiers.  Location: Patient: Virtual Visit Location Patient: Home Provider:  Virtual Visit Location Provider: Home Office   I discussed the limitations of evaluation and management by telemedicine and the availability of in person appointments. The patient expressed understanding and agreed to proceed.    History of Present Illness: Joshua Parrish is a 47 y.o. who identifies as a male who was assigned male at birth, and is being seen today for sleep concerns.  He has been suffering from difficulty sleeping/ falling asleep and staying asleep     He has tried Melatonin, Zyquil/Nyquil without success   He is currently in therapy and has been given tools on sleep hygiene  He does practice journaling  He does have Sleep apnea and wears a CPAP every night for two years   He tried hydroxyzine and caused him to be anxious and could not sleep for 48 hours. This resulted in an ED admission    Problems:  Patient Active Problem List   Diagnosis Date Noted   Major depressive disorder, recurrent episode, severe ([place]) 11/13/2022   Alcohol abuse 11/13/2022   Acute drug overdose, intentional self-harm, initial encounter ([place]) 11/12/2022   OD (overdose of drug), intentional self-harm, initial encounter ([place]) 11/11/2022   Insomnia disorder, with other sleep disorder, recurrent 11/11/2022   RSV bronchitis 08/14/2022   Chronic gout 06/13/2022   Abdominal pain 02/16/2022   Gait abnormality 01/09/2022   Morbid obesity with BMI of 45.0-49.9, adult ([place]) 01/09/2022   OSA on CPAP 01/09/2022   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   Low back pain 01/09/2022  Rotator cuff disorder, left 12/25/2017   Bilateral shoulder pain 05/22/2017   Left knee pain 05/22/2017   Urinary frequency 02/09/2017   Essential hypertension 01/16/2017   Prostatitis 12/25/2016   Sepsis ([place]) 12/25/2016   GERD without esophagitis 12/25/2016   Sinusitis 12/09/2016   Tendinitis of right rotator cuff 12/09/2016   Laryngopharyngeal reflux (LPR) 02/04/2016   Mass in neck 11/27/2015   Rotator cuff impingement syndrome of  left shoulder 06/19/2015   Acromioclavicular joint arthritis 06/19/2015    Allergies:  Allergies  Allergen Reactions   Silicone Rash    Dermabond   Coconut Oil Diarrhea, Nausea And Vomiting and Other (See Comments)    Allergic to coconuts   Lisinopril Cough   Medications:  Current Outpatient Medications:    albuterol (VENTOLIN HFA) 108 (90 Base) MCG/ACT inhaler, Inhale 2 puffs into the lungs every 4 (four) hours as needed for wheezing or shortness of breath., Disp: 6.7 g, Rfl: 0   amLODipine (NORVASC) 10 MG tablet, TAKE 1 TABLET BY MOUTH DAILY, Disp: 30 tablet, Rfl: 3   gabapentin (NEURONTIN) 300 MG capsule, Take 1 capsule (300 mg total) by mouth 3 (three) times daily., Disp: 90 capsule, Rfl: 0   hydrOXYzine (VISTARIL) 25 MG capsule, Take 25 mg by mouth 2 (two) times daily., Disp: , Rfl:    meloxicam (MOBIC) 15 MG tablet, Take 15 mg by mouth daily as needed for pain., Disp: , Rfl:    methocarbamol (ROBAXIN) 500 MG tablet, Take 500 mg by mouth 2 (two) times daily as needed for muscle spasms., Disp: , Rfl:    omeprazole (PRILOSEC) 40 MG capsule, TAKE ONE CAPSULE BY MOUTH DAILY, Disp: 90 capsule, Rfl: 3   pantoprazole (PROTONIX) 40 MG tablet, Take 1 tablet (40 mg total) by mouth daily after lunch., Disp: 30 tablet, Rfl: 0   testosterone cypionate (DEPOTESTOSTERONE CYPIONATE) 200 MG/ML injection, Inject 100 mg into the muscle every Thursday., Disp: , Rfl:    valsartan-hydrochlorothiazide (DIOVAN-HCT) 320-25 MG tablet, TAKE 1 TABLET BY MOUTH DAILY, Disp: 30 tablet, Rfl: 3   venlafaxine XR (EFFEXOR-XR) 75 MG 24 hr capsule, Take 1 capsule (75 mg total) by mouth daily with breakfast., Disp: 30 capsule, Rfl: 0  Observations/Objective: Patient is well-developed, well-nourished in no acute distress.  Resting comfortably  at home.  Head is normocephalic, atraumatic.  No labored breathing.  Speech is clear and coherent with logical content.  Patient is alert and oriented at baseline.     Assessment and Plan: 1. Insomnia, unspecified type Discussed with patient that currently Telehealth services cannot prescribe anything else for sleep aside from the things he has tried. Discussed follow up with PCP or Psychiatry to discuss next steps for sleep regimen   Continue counseling and sleep hygiene methods      Follow Up Instructions: I discussed the assessment and treatment plan with the patient. The patient was provided an opportunity to ask questions and all were answered. The patient agreed with the plan and demonstrated an understanding of the instructions.  A copy of instructions were sent to the patient via MyChart unless otherwise noted below.    The patient was advised to call back or seek an in-person evaluation if the symptoms worsen or if the condition fails to improve as anticipated.  Time:  I spent 10 minutes with the patient via telehealth technology discussing the above problems/concerns.    Joshua Schneiders, FNP

## 2022-11-21 ENCOUNTER — Telehealth: Payer: Managed Care, Other (non HMO) | Admitting: Family

## 2022-11-21 ENCOUNTER — Encounter: Payer: Self-pay | Admitting: Family

## 2022-12-01 ENCOUNTER — Other Ambulatory Visit: Payer: Self-pay | Admitting: Neurology

## 2022-12-17 ENCOUNTER — Other Ambulatory Visit: Payer: Self-pay

## 2022-12-17 ENCOUNTER — Emergency Department (EMERGENCY_DEPARTMENT_HOSPITAL)
Admission: EM | Admit: 2022-12-17 | Discharge: 2022-12-19 | Disposition: A | Discharge status: Nursing Home (Includes ICF) | Payer: 59 | Source: Home / Self Care | Attending: Emergency Medicine | Admitting: Emergency Medicine

## 2022-12-17 ENCOUNTER — Encounter (HOSPITAL_COMMUNITY): Payer: Self-pay

## 2022-12-17 ENCOUNTER — Emergency Department (HOSPITAL_COMMUNITY): Payer: 59

## 2022-12-17 DIAGNOSIS — Y901 Blood alcohol level of 20-39 mg/100 ml: Secondary | ICD-10-CM | POA: Insufficient documentation

## 2022-12-17 DIAGNOSIS — Z20822 Contact with and (suspected) exposure to covid-19: Secondary | ICD-10-CM | POA: Insufficient documentation

## 2022-12-17 DIAGNOSIS — F332 Major depressive disorder, recurrent severe without psychotic features: Secondary | ICD-10-CM | POA: Diagnosis present

## 2022-12-17 DIAGNOSIS — T391X2A Poisoning by 4-Aminophenol derivatives, intentional self-harm, initial encounter: Secondary | ICD-10-CM | POA: Insufficient documentation

## 2022-12-17 DIAGNOSIS — T39312A Poisoning by propionic acid derivatives, intentional self-harm, initial encounter: Secondary | ICD-10-CM | POA: Insufficient documentation

## 2022-12-17 DIAGNOSIS — R4182 Altered mental status, unspecified: Secondary | ICD-10-CM | POA: Insufficient documentation

## 2022-12-17 DIAGNOSIS — F439 Reaction to severe stress, unspecified: Secondary | ICD-10-CM | POA: Insufficient documentation

## 2022-12-17 DIAGNOSIS — T50902A Poisoning by unspecified drugs, medicaments and biological substances, intentional self-harm, initial encounter: Secondary | ICD-10-CM | POA: Diagnosis not present

## 2022-12-17 DIAGNOSIS — Z79899 Other long term (current) drug therapy: Secondary | ICD-10-CM | POA: Insufficient documentation

## 2022-12-17 DIAGNOSIS — I10 Essential (primary) hypertension: Secondary | ICD-10-CM | POA: Insufficient documentation

## 2022-12-17 DIAGNOSIS — F1010 Alcohol abuse, uncomplicated: Secondary | ICD-10-CM

## 2022-12-17 DIAGNOSIS — R45851 Suicidal ideations: Secondary | ICD-10-CM

## 2022-12-17 LAB — CBC WITH DIFFERENTIAL/PLATELET
Abs Immature Granulocytes: 0.05 10*3/uL (ref 0.00–0.07)
Basophils Absolute: 0 10*3/uL (ref 0.0–0.1)
Basophils Relative: 0 %
Eosinophils Absolute: 0 10*3/uL (ref 0.0–0.5)
Eosinophils Relative: 0 %
HCT: 47.5 % (ref 39.0–52.0)
Hemoglobin: 15.9 g/dL (ref 13.0–17.0)
Immature Granulocytes: 0 %
Lymphocytes Relative: 28 %
Lymphs Abs: 3.5 10*3/uL (ref 0.7–4.0)
MCH: 29.4 pg (ref 26.0–34.0)
MCHC: 33.5 g/dL (ref 30.0–36.0)
MCV: 87.8 fL (ref 80.0–100.0)
Monocytes Absolute: 0.9 10*3/uL (ref 0.1–1.0)
Monocytes Relative: 7 %
Neutro Abs: 7.8 10*3/uL — ABNORMAL HIGH (ref 1.7–7.7)
Neutrophils Relative %: 65 %
Platelets: 436 10*3/uL — ABNORMAL HIGH (ref 150–400)
RBC: 5.41 MIL/uL (ref 4.22–5.81)
RDW: 13.1 % (ref 11.5–15.5)
WBC: 12.3 10*3/uL — ABNORMAL HIGH (ref 4.0–10.5)
nRBC: 0 % (ref 0.0–0.2)

## 2022-12-17 LAB — COMPREHENSIVE METABOLIC PANEL
ALT: 47 U/L — ABNORMAL HIGH (ref 0–44)
AST: 29 U/L (ref 15–41)
Albumin: 4.5 g/dL (ref 3.5–5.0)
Alkaline Phosphatase: 73 U/L (ref 38–126)
Anion gap: 15 (ref 5–15)
BUN: 20 mg/dL (ref 6–20)
CO2: 22 mmol/L (ref 22–32)
Calcium: 9.5 mg/dL (ref 8.9–10.3)
Chloride: 99 mmol/L (ref 98–111)
Creatinine, Ser: 1.42 mg/dL — ABNORMAL HIGH (ref 0.61–1.24)
GFR, Estimated: >60 mL/min (ref >60)
Glucose, Bld: 107 mg/dL — ABNORMAL HIGH (ref 70–99)
Potassium: 2.8 mmol/L — ABNORMAL LOW (ref 3.5–5.1)
Sodium: 136 mmol/L (ref 135–145)
Total Bilirubin: 0.7 mg/dL (ref 0.3–1.2)
Total Protein: 7.4 g/dL (ref 6.5–8.1)

## 2022-12-17 LAB — RAPID URINE DRUG SCREEN, HOSP PERFORMED
Amphetamines: NOT DETECTED
Barbiturates: NOT DETECTED
Benzodiazepines: NOT DETECTED
Cocaine: NOT DETECTED
Opiates: NOT DETECTED
Tetrahydrocannabinol: NOT DETECTED

## 2022-12-17 LAB — ETHANOL: Alcohol, Ethyl (B): 28 mg/dL — ABNORMAL HIGH (ref <10)

## 2022-12-17 LAB — ACETAMINOPHEN LEVEL
Acetaminophen (Tylenol), Serum: 148 ug/mL — ABNORMAL HIGH (ref 10–30)
Acetaminophen (Tylenol), Serum: 56 ug/mL — ABNORMAL HIGH (ref 10–30)

## 2022-12-17 LAB — SALICYLATE LEVEL: Salicylate Lvl: <7 mg/dL — ABNORMAL LOW (ref 7.0–30.0)

## 2022-12-17 LAB — CBG MONITORING, ED: Glucose-Capillary: 109 mg/dL — ABNORMAL HIGH (ref 70–99)

## 2022-12-17 MED ORDER — FOLIC ACID 1 MG PO TABS
1.0000 mg | ORAL_TABLET | Freq: Every day | ORAL | Status: DC
  Administered 2022-12-18 – 2022-12-19 (×2): 1 mg via ORAL
  Filled 2022-12-17 (×2): qty 1

## 2022-12-17 MED ORDER — LORAZEPAM 1 MG PO TABS
1.0000 mg | ORAL_TABLET | ORAL | Status: DC | PRN
  Administered 2022-12-18: 1 mg via ORAL
  Filled 2022-12-17: qty 1

## 2022-12-17 MED ORDER — THIAMINE HCL 100 MG/ML IJ SOLN
100.0000 mg | Freq: Every day | INTRAMUSCULAR | Status: DC
  Administered 2022-12-17: 100 mg via INTRAVENOUS
  Filled 2022-12-17: qty 2

## 2022-12-17 MED ORDER — ADULT MULTIVITAMIN W/MINERALS CH
1.0000 | ORAL_TABLET | Freq: Every day | ORAL | Status: DC
  Administered 2022-12-18 – 2022-12-19 (×2): 1 via ORAL
  Filled 2022-12-17 (×2): qty 1

## 2022-12-17 MED ORDER — LORAZEPAM 2 MG/ML IJ SOLN
1.0000 mg | INTRAMUSCULAR | Status: DC | PRN
  Administered 2022-12-17: 2 mg via INTRAVENOUS
  Filled 2022-12-17: qty 1

## 2022-12-17 MED ORDER — SODIUM CHLORIDE 0.9 % IV BOLUS
1000.0000 mL | Freq: Once | INTRAVENOUS | Status: AC
  Administered 2022-12-17: 1000 mL via INTRAVENOUS

## 2022-12-17 MED ORDER — THIAMINE MONONITRATE 100 MG PO TABS
100.0000 mg | ORAL_TABLET | Freq: Every day | ORAL | Status: DC
  Administered 2022-12-18 – 2022-12-19 (×2): 100 mg via ORAL
  Filled 2022-12-17 (×2): qty 1

## 2022-12-17 MED ORDER — POTASSIUM CHLORIDE CRYS ER 20 MEQ PO TBCR
40.0000 meq | EXTENDED_RELEASE_TABLET | Freq: Once | ORAL | Status: AC
  Administered 2022-12-18: 40 meq via ORAL
  Filled 2022-12-17: qty 2

## 2022-12-17 MED ORDER — ONDANSETRON HCL 4 MG/2ML IJ SOLN
4.0000 mg | Freq: Once | INTRAMUSCULAR | Status: AC
  Administered 2022-12-17: 4 mg via INTRAVENOUS
  Filled 2022-12-17: qty 2

## 2022-12-17 NOTE — ED Provider Notes (Signed)
[place] EMERGENCY DEPARTMENT AT [facility] Provider Note  CSN: [id number] Arrival date & time: 12/17/22 1759  Chief Complaint(s) Suicidal  HPI Joshua Parrish is a 47 y.o. male with past medical history as below, significant for anxiety, hypertension, depression, prior overdose, alcohol abuse who presents to the ED with complaint of overdose.  Patient reports around 4:00 today he took an unknown quantity of Motrin and Tylenol.  He thinks may be between 10 and 12 tablets of each.  He did this because he was upset about his partner leaving him and other increased stress at home.  He also drank 1 alcoholic beverage.  He does not describe an overt ongoing plan for suicide or homicide.  Does not appear acutely psychotic.  No hallucinations.  No falls or recent head injuries.  Reports some mild epigastric burning and slight nausea.  No change in bowel or bladder function.  No vomiting.  No dyspnea.  Past Medical History Past Medical History:  Diagnosis Date   Anxiety    Chicken pox    Depression    Fatty liver    GERD (gastroesophageal reflux disease)    H/O hernia repair 03/31/2022   Hypertension    Migraines    Osteoarthritis of shoulder    left   Rotator cuff impingement syndrome of left shoulder 06/19/2015   Sinusitis    Sleep apnea    Patient Active Problem List   Diagnosis Date Noted   Major depressive disorder, recurrent episode, severe 11/13/2022   Alcohol abuse 11/13/2022   Acute drug overdose, intentional self-harm, initial encounter 11/12/2022   OD (overdose of drug), intentional self-harm, initial encounter 11/11/2022   Insomnia disorder, with other sleep disorder, recurrent 11/11/2022   RSV bronchitis 08/14/2022   Chronic gout 06/13/2022   Abdominal pain 02/16/2022   Gait abnormality 01/09/2022   Morbid obesity with BMI of 45.0-49.9, adult 01/09/2022   OSA on CPAP 01/09/2022   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   Low back pain 01/09/2022   Rotator cuff disorder, left  12/25/2017   Bilateral shoulder pain 05/22/2017   Left knee pain 05/22/2017   Urinary frequency 02/09/2017   Essential hypertension 01/16/2017   Prostatitis 12/25/2016   Sepsis 12/25/2016   GERD without esophagitis 12/25/2016   Sinusitis 12/09/2016   Tendinitis of right rotator cuff 12/09/2016   Laryngopharyngeal reflux (LPR) 02/04/2016   Mass in neck 11/27/2015   Rotator cuff impingement syndrome of left shoulder 06/19/2015   Acromioclavicular joint arthritis 06/19/2015   Home Medication(s) Prior to Admission medications   Medication Sig Start Date End Date Taking? Authorizing Provider  amLODipine (NORVASC) 10 MG tablet TAKE 1 TABLET BY MOUTH DAILY 09/02/22  Yes Olive Bass, FNP  anastrozole (ARIMIDEX) 1 MG tablet Take 1 mg by mouth every Monday, Wednesday, and Friday.   Yes [provider]  gabapentin (NEURONTIN) 300 MG capsule Take 1 capsule (300 mg total) by mouth 3 (three) times daily. 11/16/22  Yes Leevy-Johnson, Lina Sar, NP  omeprazole (PRILOSEC) 40 MG capsule TAKE ONE CAPSULE BY MOUTH DAILY 09/29/22  Yes Olive Bass, FNP  pantoprazole (PROTONIX) 40 MG tablet Take 1 tablet (40 mg total) by mouth daily after lunch. 11/17/22  Yes Leevy-Johnson, Lina Sar, NP  testosterone cypionate (DEPOTESTOSTERONE CYPIONATE) 200 MG/ML injection Inject 100 mg into the muscle every Thursday. 03/07/22  Yes [provider]  valsartan-hydrochlorothiazide (DIOVAN-HCT) 320-25 MG tablet TAKE 1 TABLET BY MOUTH DAILY 09/02/22  Yes Olive Bass, FNP  venlafaxine XR (EFFEXOR-XR) 75 MG  24 hr capsule Take 1 capsule (75 mg total) by mouth daily with breakfast. 11/17/22  Yes Leevy-Johnson, Lina Sar, NP                                                                                                                                    Past Surgical History Past Surgical History:  Procedure Laterality Date   APPENDECTOMY     CERVICAL LAMINECTOMY     has plates and fusion    CHOLECYSTECTOMY N/A 12/24/2015   Procedure: LAPAROSCOPIC CHOLECYSTECTOMY ;  Surgeon: Gaynelle Adu, MD;  Location: [facility];  Service: General;  Laterality: N/A;   CLAVICLE EXCISION Left    HYPOSPADIAS CORRECTION     as child   KNEE SURGERY Bilateral    multiple   SHOULDER ARTHROSCOPY WITH ROTATOR CUFF REPAIR Left 06/26/2015   Procedure: SHOULDER ARTHROSCOPY WITH ROTATOR CUFF REPAIR;  Surgeon: Salvatore Marvel, MD;  Location: [facility];  Service: Orthopedics;  Laterality: Left;   Family History Family History  Problem Relation Age of Onset   Arthritis Mother    Ovarian cancer Mother    Alcohol abuse Father    Breast cancer Maternal Uncle    Breast cancer Maternal Grandmother    Cancer Maternal Grandmother        spinal surgery   Cancer Other    Hypertension Other    Diabetes Other    Breast cancer Maternal Great-grandfather    Colon cancer Neg Hx    Sleep apnea Neg Hx     Social History Social History   Tobacco Use   Smoking status: Never   Smokeless tobacco: Current    Types: Chew   Tobacco comments:    form given 11/29/15  Vaping Use   Vaping Use: Never used  Substance Use Topics   Alcohol use: Yes    Alcohol/week: 0.0 standard drinks of alcohol    Comment: occasional   Drug use: No   Allergies Silicone, Coconut oil, and Lisinopril  Review of Systems Review of Systems  Constitutional:  Negative for chills and fever.  HENT:  Negative for facial swelling and trouble swallowing.   Eyes:  Negative for photophobia and visual disturbance.  Respiratory:  Negative for cough and shortness of breath.   Cardiovascular:  Negative for chest pain and palpitations.  Gastrointestinal:  Positive for abdominal pain and nausea. Negative for vomiting.  Endocrine: Negative for polydipsia and polyuria.  Genitourinary:  Negative for difficulty urinating and hematuria.  Musculoskeletal:  Negative for gait problem and joint swelling.  Skin:  Negative for pallor and rash.   Neurological:  Negative for syncope and headaches.  Psychiatric/Behavioral:  Positive for dysphoric mood. Negative for agitation and confusion.     Physical Exam Vital Signs  I have reviewed the triage vital signs BP 96/61 (BP Location: Right Arm)   Pulse 68   Temp 98.4 F (36.9 C) (Oral)   Resp  17   Ht 5\' 8"  (1.727 m)   Wt 123.4 kg   SpO2 98%   BMI 41.36 kg/m  Physical Exam Vitals and nursing note reviewed.  Constitutional:      General: He is not in acute distress.    Appearance: Normal appearance. He is well-developed. He is obese. He is not ill-appearing.  HENT:     Head: Normocephalic and atraumatic. No raccoon eyes, Battle's sign, right periorbital erythema or left periorbital erythema.     Comments: No external signs of head trauma    Right Ear: External ear normal.     Left Ear: External ear normal.     Mouth/Throat:     Mouth: Mucous membranes are moist.  Eyes:     General: No scleral icterus. Cardiovascular:     Rate and Rhythm: Normal rate and regular rhythm.     Pulses: Normal pulses.     Heart sounds: Normal heart sounds.  Pulmonary:     Effort: Pulmonary effort is normal. No respiratory distress.     Breath sounds: Normal breath sounds.  Abdominal:     General: Abdomen is flat.     Palpations: Abdomen is soft.     Tenderness: There is no abdominal tenderness.  Musculoskeletal:     Right lower leg: No edema.     Left lower leg: No edema.  Skin:    General: Skin is warm and dry.     Capillary Refill: Capillary refill takes less than 2 seconds.  Neurological:     Mental Status: He is alert and oriented to person, place, and time.     GCS: GCS eye subscore is 4. GCS verbal subscore is 5. GCS motor subscore is 6.     Cranial Nerves: Cranial nerves 2-12 are intact.     Sensory: Sensation is intact.     Motor: Motor function is intact.     Coordination: Coordination is intact.     Comments: No clonus Strength 5/5 bilateral upper and lower extremities   Psychiatric:        Mood and Affect: Mood is depressed. Affect is tearful.        Behavior: Behavior is slowed and withdrawn. Behavior is cooperative.        Thought Content: Thought content includes suicidal ideation. Thought content does not include homicidal ideation. Thought content does not include homicidal or suicidal plan.     ED Results and Treatments Labs (all labs ordered are listed, but only abnormal results are displayed) Labs Reviewed  COMPREHENSIVE METABOLIC PANEL - Abnormal; Notable for the following components:      Result Value   Potassium 2.8 (*)    Glucose, Bld 107 (*)    Creatinine, Ser 1.42 (*)    ALT 47 (*)    All other components within normal limits  SALICYLATE LEVEL - Abnormal; Notable for the following components:   Salicylate Lvl <7.0 (*)    All other components within normal limits  ACETAMINOPHEN LEVEL - Abnormal; Notable for the following components:   Acetaminophen (Tylenol), Serum 148 (*)    All other components within normal limits  ETHANOL - Abnormal; Notable for the following components:   Alcohol, Ethyl (B) 28 (*)    All other components within normal limits  CBC WITH DIFFERENTIAL/PLATELET - Abnormal; Notable for the following components:   WBC 12.3 (*)    Platelets 436 (*)    Neutro Abs 7.8 (*)    All other components within normal limits  ACETAMINOPHEN LEVEL - Abnormal; Notable for the following components:   Acetaminophen (Tylenol), Serum 56 (*)    All other components within normal limits  CBG MONITORING, ED - Abnormal; Notable for the following components:   Glucose-Capillary 109 (*)    All other components within normal limits  SARS CORONAVIRUS 2 BY RT PCR  RAPID URINE DRUG SCREEN, HOSP PERFORMED                                                                                                                          Radiology DG Chest Portable 1 View  Result Date: 12/17/2022 CLINICAL DATA:  Tachypnea, suicidal ideation, possible  overdose EXAM: PORTABLE CHEST 1 VIEW COMPARISON:  10/14/2022 FINDINGS: Lungs are clear.  No pleural effusion or pneumothorax. The heart is normal in size. Cervical spine fixation hardware, incompletely visualized. IMPRESSION: No acute cardiopulmonary disease. Electronically Signed   By: Charline Bills M.D.   On: 12/17/2022 19:51    Pertinent labs & imaging results that were available during my care of the patient were reviewed by me and considered in my medical decision making (see MDM for details).  Medications Ordered in ED Medications  LORazepam (ATIVAN) tablet 1-4 mg ( Oral See Alternative 12/17/22 1949)    Or  LORazepam (ATIVAN) injection 1-4 mg (2 mg Intravenous Given 12/17/22 1949)  thiamine (VITAMIN B1) tablet 100 mg (100 mg Oral Given by Other 12/18/22 1016)    Or  thiamine (VITAMIN B1) injection 100 mg ( Intravenous See Alternative 12/18/22 1016)  folic acid (FOLVITE) tablet 1 mg (1 mg Oral Given by Other 12/18/22 1014)  multivitamin with minerals tablet 1 tablet (1 tablet Oral Given by Other 12/18/22 1015)  sodium chloride 0.9 % bolus 1,000 mL (0 mLs Intravenous Stopped 12/17/22 1937)  ondansetron (ZOFRAN) injection 4 mg (4 mg Intravenous Given 12/17/22 1838)  sodium chloride 0.9 % bolus 1,000 mL (0 mLs Intravenous Stopped 12/17/22 2050)  potassium chloride SA (KLOR-CON M) CR tablet 40 mEq (40 mEq Oral Given 12/18/22 0037)                                                                                                                                     Procedures Procedures  (including critical care time)  Medical Decision Making / ED Course    Medical Decision Making:    IVO MOGA is a 47 y.o. male  with past medical history as below, significant for anxiety, hypertension, depression, prior overdose, alcohol abuse who presents to the ED with complaint of overdose.. The complaint involves an extensive differential diagnosis and also carries with it a high risk of complications and  morbidity.  Serious etiology was considered. Ddx includes but is not limited to: Suicide attempt, medication effect, psychiatric disturbance, metabolic derangement, infectious, intoxication, etc.  Complete initial physical exam performed, notably the patient  was no acute distress, awakens easily.    Reviewed and confirmed nursing documentation for past medical history, family history, social history.  Vital signs reviewed.    Clinical Course as of 12/18/22 1503  Wed Dec 17, 2022  2348 4 hour tylenol level is downtrending. K is low, replaced orally. Pt is feeling better, medically cleared at this time for TTS eval. IVC was completed for suicide attempt [SG]  2349 Alcohol, Ethyl (B)(!): 28 Reports ETOH pta [SG]  2356 Time of ingestion approximately 4 PM today.  Poison control recommend 6-8 hour observation. It has been approximately 8 hours since ingestion. Pt is HDS, labs stable, APAP downtrending.  [SG]    Clinical Course User Index [SG] Sloan Leiter, DO   He is medically cleared, dispo pending TTS eval for intentional overdose.    Additional history obtained: -Additional history obtained from ems -External records from outside source obtained and reviewed including: Chart review including previous notes, labs, imaging, consultation notes including prior ED notes, prior labs and imaging, home medications She was seen 3/12 secondary to intentional overdose, at that point he reported that he took approximately 38 tablets of 25 mg Atarax, he was admitted to inpatient psychiatric services at that time.   Lab Tests: -I ordered, reviewed, and interpreted labs.   The pertinent results include:   Labs Reviewed  COMPREHENSIVE METABOLIC PANEL - Abnormal; Notable for the following components:      Result Value   Potassium 2.8 (*)    Glucose, Bld 107 (*)    Creatinine, Ser 1.42 (*)    ALT 47 (*)    All other components within normal limits  SALICYLATE LEVEL - Abnormal; Notable for the  following components:   Salicylate Lvl <7.0 (*)    All other components within normal limits  ACETAMINOPHEN LEVEL - Abnormal; Notable for the following components:   Acetaminophen (Tylenol), Serum 148 (*)    All other components within normal limits  ETHANOL - Abnormal; Notable for the following components:   Alcohol, Ethyl (B) 28 (*)    All other components within normal limits  CBC WITH DIFFERENTIAL/PLATELET - Abnormal; Notable for the following components:   WBC 12.3 (*)    Platelets 436 (*)    Neutro Abs 7.8 (*)    All other components within normal limits  ACETAMINOPHEN LEVEL - Abnormal; Notable for the following components:   Acetaminophen (Tylenol), Serum 56 (*)    All other components within normal limits  CBG MONITORING, ED - Abnormal; Notable for the following components:   Glucose-Capillary 109 (*)    All other components within normal limits  SARS CORONAVIRUS 2 BY RT PCR  RAPID URINE DRUG SCREEN, HOSP PERFORMED    Notable for apap + but downtrending  EKG   EKG Interpretation  Date/Time:    Ventricular Rate:    PR Interval:    QRS Duration:   QT Interval:    QTC Calculation:   R Axis:     Text Interpretation:  Imaging Studies ordered: I ordered imaging studies including cxr I independently visualized the following imaging with scope of interpretation limited to determining acute life threatening conditions related to emergency care; findings noted above, significant for stable I independently visualized and interpreted imaging. I agree with the radiologist interpretation   Medicines ordered and prescription drug management: Meds ordered this encounter  Medications   sodium chloride 0.9 % bolus 1,000 mL   ondansetron (ZOFRAN) injection 4 mg   OR Linked Order Group    LORazepam (ATIVAN) tablet 1-4 mg     Order Specific Question:   CIWA-AR < 5 =     Answer:   0 mg     Order Specific Question:   CIWA-AR 5 -10 =     Answer:   1 mg     Order  Specific Question:   CIWA-AR 11 -15 =     Answer:   2 mg     Order Specific Question:   CIWA-AR 16 -20 =     Answer:   3 mg     Order Specific Question:   CIWA-AR 16 -20 =     Answer:   Recheck CIWA-AR in 1 hour; if > 20 notify MD     Order Specific Question:   CIWA-AR > 20 =     Answer:   4 mg     Order Specific Question:   CIWA-AR > 20 =     Answer:   Call Rapid Response    LORazepam (ATIVAN) injection 1-4 mg     Order Specific Question:   CIWA-AR < 5 =     Answer:   0 mg     Order Specific Question:   CIWA-AR 5 -10 =     Answer:   1 mg     Order Specific Question:   CIWA-AR 11 -15 =     Answer:   2 mg     Order Specific Question:   CIWA-AR 16 -20 =     Answer:   3 mg     Order Specific Question:   CIWA-AR 16 -20 =     Answer:   Recheck CIWA-AR in 1 hour; if > 20 notify MD     Order Specific Question:   CIWA-AR > 20 =     Answer:   4 mg     Order Specific Question:   CIWA-AR > 20 =     Answer:   Call Rapid Response   OR Linked Order Group    thiamine (VITAMIN B1) tablet 100 mg    thiamine (VITAMIN B1) injection 100 mg   folic acid (FOLVITE) tablet 1 mg   multivitamin with minerals tablet 1 tablet   sodium chloride 0.9 % bolus 1,000 mL   potassium chloride SA (KLOR-CON M) CR tablet 40 mEq    -I have reviewed the patients home medicines and have made adjustments as needed   Consultations Obtained: I requested consultation with the toc/tts,     Cardiac Monitoring: The patient was maintained on a cardiac monitor.  I personally viewed and interpreted the cardiac monitored which showed an underlying rhythm of: NSR  Social Determinants of Health:  Diagnosis or treatment significantly limited by social determinants of health: alcohol use and polysubstance abuse   Reevaluation: After the interventions noted above, I reevaluated the patient and found that they have improved  Co morbidities that complicate the patient evaluation  Past Medical History:  Diagnosis Date  Anxiety    Chicken pox    Depression    Fatty liver    GERD (gastroesophageal reflux disease)    H/O hernia repair 03/31/2022   Hypertension    Migraines    Osteoarthritis of shoulder    left   Rotator cuff impingement syndrome of left shoulder 06/19/2015   Sinusitis    Sleep apnea       Dispostion: Disposition decision including need for hospitalization was considered, and patient disposition pending at time of sign out.    Final Clinical Impression(s) / ED Diagnoses Final diagnoses:  Intentional overdose, initial encounter  Alcohol abuse     This chart was dictated using voice recognition software.  Despite best efforts to proofread,  errors can occur which can change the documentation meaning.    Sloan Leiter, DO 12/18/22 [phone]

## 2022-12-17 NOTE — ED Notes (Signed)
Pt has 2 belongings bags, placed in cabinet above 5-8 assignment, phone, wallet, and smart watch in bag

## 2022-12-17 NOTE — ED Triage Notes (Signed)
BIBA from home for suicidal ideation, possible tylenol and ibuprofen overdose. Pt states he drank one liquor dink, and "probably about a bottle" of pills, unsure of which ones or dosages.

## 2022-12-17 NOTE — ED Notes (Signed)
Per poison control, repeat acetaminophen level after 4 hours, monitor for at least 6-8 hours

## 2022-12-17 NOTE — Social Work (Signed)
CSW has reviewed chart, TOC will follow up at DC for resources, as this patient is currently under observation for overdose of medications.

## 2022-12-17 NOTE — ED Notes (Signed)
Pt states he may have also taken unknown amount of gabapentin and valsartan

## 2022-12-17 NOTE — ED Notes (Signed)
Pt was wanded by security.  

## 2022-12-18 ENCOUNTER — Emergency Department (HOSPITAL_COMMUNITY): Payer: 59

## 2022-12-18 DIAGNOSIS — T50902A Poisoning by unspecified drugs, medicaments and biological substances, intentional self-harm, initial encounter: Secondary | ICD-10-CM | POA: Diagnosis not present

## 2022-12-18 DIAGNOSIS — R45851 Suicidal ideations: Secondary | ICD-10-CM

## 2022-12-18 LAB — SARS CORONAVIRUS 2 BY RT PCR: SARS Coronavirus 2 by RT PCR: NEGATIVE

## 2022-12-18 MED ORDER — AMLODIPINE BESYLATE 5 MG PO TABS
10.0000 mg | ORAL_TABLET | Freq: Every day | ORAL | Status: DC
  Administered 2022-12-18 – 2022-12-19 (×2): 10 mg via ORAL
  Filled 2022-12-18 (×2): qty 2

## 2022-12-18 MED ORDER — VENLAFAXINE HCL ER 75 MG PO CP24
75.0000 mg | ORAL_CAPSULE | Freq: Every day | ORAL | Status: DC
  Administered 2022-12-19: 75 mg via ORAL
  Filled 2022-12-18: qty 1

## 2022-12-18 MED ORDER — GABAPENTIN 300 MG PO CAPS
300.0000 mg | ORAL_CAPSULE | Freq: Three times a day (TID) | ORAL | Status: DC
  Administered 2022-12-18 – 2022-12-19 (×2): 300 mg via ORAL
  Filled 2022-12-18 (×2): qty 1

## 2022-12-18 MED ORDER — PANTOPRAZOLE SODIUM 40 MG PO TBEC
40.0000 mg | DELAYED_RELEASE_TABLET | Freq: Every day | ORAL | Status: DC
  Administered 2022-12-18 – 2022-12-19 (×2): 40 mg via ORAL
  Filled 2022-12-18 (×2): qty 1

## 2022-12-18 NOTE — ED Notes (Signed)
Pt dinner tray arrived.  

## 2022-12-18 NOTE — ED Notes (Signed)
Spoke with Caryn Bee pharmacist with poison control regarding update on patient no new recommendations at this time

## 2022-12-18 NOTE — ED Notes (Signed)
Pt was given water and [place] sandwich as a snack.

## 2022-12-18 NOTE — ED Notes (Signed)
pt lunch has arrived 

## 2022-12-18 NOTE — Consult Note (Signed)
BH ED ASSESSMENT   Reason for Consult:  depression Referring Physician: Dr. Anitra Lauth Patient Identification: Joshua Parrish MRN:  [id number] ED Chief Complaint: Major depressive disorder, recurrent episode, severe  Diagnosis:  Principal Problem:   Major depressive disorder, recurrent episode, severe Active Problems:   Suicidal ideation   ED Assessment Time Calculation: Start Time: 1600 Stop Time: 1640 Total Time in Minutes (Assessment Completion): 40   HPI: Per Triage Note: BIBA from home for suicidal ideation, possible tylenol and ibuprofen overdose. Pt states he drank one liquor dink, and "probably about a bottle" of pills, unsure of which ones or dosages.     Subjective: Joshua Parrish, 47 y.o., male patient seen face to face by this provider, consulted with Dr. Lucianne Muss; and chart reviewed on 12/18/22.  On evaluation Joshua Parrish reports that he attempted an overdose by taking Tylenol and ibuprofen, unknown amount and drank about 1 liquid drink, because he says that he does not know why he is alive, he feels no one cares about him.  States that he has been reaching out to different people such as therapist and psychiatrist about running out of his medications and said that no one has called him back, and even helped him to try and refill his medications.  Then he said that when he got home 1 day he had an eviction notice on his door, and says that he spoke with the apartment rental office and they told him that they would work with him financially and then placed in eviction notice on his door, after that patient said "I just feel like giving up, nobody is listening to me ".  Patient states that the friends who said they were going to be supportive for him and be there for him have not called him since his last hospitalization in March 2024, so he feels no one cares about him, says that he has his mother but she is so negative towards him but that is all he has.  Says he attempted to start a dating life,  but the women who he wanted to date did not feel the same way and called him rude names, and told him they were not interested.  Patient did state that one prior thing in his life is that he has started back going to the gym and working out, but says that is not enough but he does feel a little better.  Patient states that he lives by himself and he works by fixing garage doors, repairing and replacing.  Patient says his appetite and sleep are fair due to depression.  Patient denies using any alcohol or drugs, UDS is negative, and BAL is 28.He also suffers from sleep apnea and uses CPAP Machine. Patient suffers from Depression and anxiety and rates both 8/10 with 10 being severe anxiety and depression. He reports suicide attempt one time long ago by using alcohol to drink himself to death. Patient sees Sherril Cong as his mental health provider and therapist is Laurel Dimmer.   During evaluation Joshua Parrish is laying in his hospital bed in no acute distress.  He is alert, oriented x 4, calm, cooperative and attentive. His mood is depressed with congruent affect. He has normal speech, and behavior.  Objectively there is no evidence of psychosis/mania or delusional thinking.  Patient is able to converse coherently, goal directed thoughts, no distractibility, or pre-occupation. He denies homicidal ideation, psychosis, and paranoia.  Patient endorses suicidal ideations, says he feels lonely and  has no support, would not inform provider of what his next plan is.  Patient answered question appropriately.     Past Psychiatric History: previous hx anxiety, insomnia and depression.  No mental health inpatient hospitalization.  Sherril Cong is Psychiatrist while Amy Jean Rosenthal is therapist.      Risk to Self:  Passive SI  Risk to Others:  No  Prior Inpatient Therapy:  Yes  Prior Outpatient Therapy: Yes   [place] Scale:  Flowsheet Row ED from 12/17/2022 in [facility] at [facility]  Ad[facility]) [facility] from 11/11/2022 in [facility]  C-SSRS RISK CATEGORY High Risk No Risk High Risk       AIMS:  , , ,  ,   ASAM:    Substance Abuse:     Past Medical History:  Past Medical History:  Diagnosis Date   Anxiety    Chicken pox    Depression    Fatty liver    GERD (gastroesophageal reflux disease)    H/O hernia repair 03/31/2022   Hypertension    Migraines    Osteoarthritis of shoulder    left   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 06/19/2015   Sinusitis    Sleep apnea     Past Surgical History:  Procedure Laterality Date   APPENDECTOMY     CERVICAL LAMINECTOMY     has plates and fusion   CHOLECYSTECTOMY N/A 12/24/2015   Procedure: LAPAROSCOPIC CHOLECYSTECTOMY ;  Surgeon: Gaynelle Adu, MD;  Location: [facility];  Service: General;  Laterality: N/A;   CLAVICLE EXCISION Left    HYPOSPADIAS CORRECTION     as child   KNEE SURGERY Bilateral    multiple   SHOULDER ARTHROSCOPY WITH ROTATOR Parrish REPAIR Left 06/26/2015   Procedure: SHOULDER ARTHROSCOPY WITH ROTATOR Parrish REPAIR;  Surgeon: Salvatore Marvel, MD;  Location: [facility];  Service: Orthopedics;  Laterality: Left;   Family History:  Family History  Problem Relation Age of Onset   Arthritis Mother    Ovarian cancer Mother    Alcohol abuse Father    Breast cancer Maternal Uncle    Breast cancer Maternal Grandmother    Cancer Maternal Grandmother        spinal surgery   Cancer Other    Hypertension Other    Diabetes Other    Breast cancer Maternal Great-grandfather    Colon cancer Neg Hx    Sleep apnea Neg Hx     Social History:  Social History   Substance and Sexual Activity  Alcohol Use Yes   Alcohol/week: 0.0 standard drinks of alcohol   Comment: occasional     Social History   Substance and Sexual Activity  Drug Use No    Social History    Socioeconomic History   Marital status: Divorced    Spouse name: Not on file   Number of children: 1   Years of education: 12   Highest education level: Not on file  Occupational History   Occupation: Garage Administrator, sports   Tobacco Use   Smoking status: Never   Smokeless tobacco: Current    Types: Chew   Tobacco comments:    form given 11/29/15  Vaping Use   Vaping Use: Never used  Substance and Sexual Activity   Alcohol use: Yes    Alcohol/week: 0.0 standard drinks of alcohol    Comment: occasional  Drug use: No   Sexual activity: Yes  Other Topics Concern   Not on file  Social History Narrative   Fun: Lift weights, hike   Social Determinants of Health   Financial Resource Strain: Not on file  Food Insecurity: No Food Insecurity (11/12/2022)   Hunger Vital Sign    Worried About Running Out of Food in the Last Year: Never true    Ran Out of Food in the Last Year: Never true  Transportation Needs: No Transportation Needs (11/12/2022)   PRAPARE - Administrator, Civil Service (Medical): No    Lack of Transportation (Non-Medical): No  Physical Activity: Not on file  Stress: Not on file  Social Connections: Not on file      Allergies:   Allergies  Allergen Reactions   Silicone Rash    Dermabond   Coconut Oil Diarrhea, Nausea And Vomiting and Other (See Comments)   Lisinopril Cough    Labs:  Results for orders placed or performed during the hospital encounter of 12/17/22 (from the past 48 hour(s))  Urine rapid drug screen (hosp performed)     Status: None   Collection Time: 12/17/22  6:11 PM  Result Value Ref Range   Opiates NONE DETECTED NONE DETECTED   Cocaine NONE DETECTED NONE DETECTED   Benzodiazepines NONE DETECTED NONE DETECTED   Amphetamines NONE DETECTED NONE DETECTED   Tetrahydrocannabinol NONE DETECTED NONE DETECTED   Barbiturates NONE DETECTED NONE DETECTED    Comment: (NOTE) DRUG SCREEN FOR MEDICAL PURPOSES ONLY.  IF CONFIRMATION  IS NEEDED FOR ANY PURPOSE, NOTIFY LAB WITHIN 5 DAYS.  LOWEST DETECTABLE LIMITS FOR URINE DRUG SCREEN Drug Class                     Cutoff (ng/mL) Amphetamine and metabolites    1000 Barbiturate and metabolites    200 Benzodiazepine                 200 Opiates and metabolites        300 Cocaine and metabolites        300 THC                            50 Performed at [facility], [address], [place], [place] [address]   CBG monitoring, ED     Status: Abnormal   Collection Time: 12/17/22  6:38 PM  Result Value Ref Range   Glucose-Capillary 109 (H) 70 - 99 mg/dL    Comment: Glucose reference range applies only to samples taken after fasting for at least 8 hours.  Comprehensive metabolic panel     Status: Abnormal   Collection Time: 12/17/22  6:43 PM  Result Value Ref Range   Sodium 136 135 - 145 mmol/L   Potassium 2.8 (L) 3.5 - 5.1 mmol/L   Chloride 99 98 - 111 mmol/L   CO2 22 22 - 32 mmol/L   Glucose, Bld 107 (H) 70 - 99 mg/dL    Comment: Glucose reference range applies only to samples taken after fasting for at least 8 hours.   BUN 20 6 - 20 mg/dL   Creatinine, Ser 4.09 (H) 0.61 - 1.24 mg/dL   Calcium 9.5 8.9 - 81.1 mg/dL   Total Protein 7.4 6.5 - 8.1 g/dL   Albumin 4.5 3.5 - 5.0 g/dL   AST 29 15 - 41 U/L   ALT 47 (H) 0 -  44 U/L   Alkaline Phosphatase 73 38 - 126 U/L   Total Bilirubin 0.7 0.3 - 1.2 mg/dL   GFR, Estimated >16 >10 mL/min    Comment: (NOTE) Calculated using the CKD-EPI Creatinine Equation (2021)    Anion gap 15 5 - 15    Comment: Performed at [facility], [address] [place], [place] [address]  Salicylate level     Status: Abnormal   Collection Time: 12/17/22  6:43 PM  Result Value Ref Range   Salicylate Lvl <7.0 (L) 7.0 - 30.0 mg/dL    Comment: Performed at [facility], [address], [place], [place] [address]  Acetaminophen level     Status: Abnormal   Collection Time: 12/17/22   6:43 PM  Result Value Ref Range   Acetaminophen (Tylenol), Serum 148 (H) 10 - 30 ug/mL    Comment: (NOTE) Therapeutic concentrations vary significantly. A range of 10-30 ug/mL  may be an effective concentration for many patients. However, some  are best treated at concentrations outside of this range. Acetaminophen concentrations >150 ug/mL at 4 hours after ingestion  and >50 ug/mL at 12 hours after ingestion are often associated with  toxic reactions.  Performed at [facility], [address] [place], [place] [address]   Ethanol     Status: Abnormal   Collection Time: 12/17/22  6:43 PM  Result Value Ref Range   Alcohol, Ethyl (B) 28 (H) <10 mg/dL    Comment: (NOTE) Lowest detectable limit for serum alcohol is 10 mg/dL.  For medical purposes only. Performed at [facility], [address], [place], [place] [address]   CBC WITH DIFFERENTIAL     Status: Abnormal   Collection Time: 12/17/22  6:43 PM  Result Value Ref Range   WBC 12.3 (H) 4.0 - 10.5 K/uL   RBC 5.41 4.22 - 5.81 MIL/uL   Hemoglobin 15.9 13.0 - 17.0 g/dL   HCT 21.3 08.6 - 57.8 %   MCV 87.8 80.0 - 100.0 fL   MCH 29.4 26.0 - 34.0 pg   MCHC 33.5 30.0 - 36.0 g/dL   RDW 46.9 62.9 - 52.8 %   Platelets 436 (H) 150 - 400 K/uL   nRBC 0.0 0.0 - 0.2 %   Neutrophils Relative % 65 %   Neutro Abs 7.8 (H) 1.7 - 7.7 K/uL   Lymphocytes Relative 28 %   Lymphs Abs 3.5 0.7 - 4.0 K/uL   Monocytes Relative 7 %   Monocytes Absolute 0.9 0.1 - 1.0 K/uL   Eosinophils Relative 0 %   Eosinophils Absolute 0.0 0.0 - 0.5 K/uL   Basophils Relative 0 %   Basophils Absolute 0.0 0.0 - 0.1 K/uL   Immature Granulocytes 0 %   Abs Immature Granulocytes 0.05 0.00 - 0.07 K/uL    Comment: Performed at [facility], [address], [place], [place] [address]  Acetaminophen level     Status: Abnormal   Collection Time: 12/17/22 10:00 PM  Result Value Ref Range   Acetaminophen (Tylenol),  Serum 56 (H) 10 - 30 ug/mL    Comment: (NOTE) Therapeutic concentrations vary significantly. A range of 10-30 ug/mL  may be an effective concentration for many patients. However, some  are best treated at concentrations outside of this range. Acetaminophen concentrations >150 ug/mL at 4 hours after ingestion  and >50 ug/mL at 12 hours after ingestion are often associated with  toxic reactions.  Performed at [facility], [address] [place], [place] [address]   SARS Coronavirus 2 by RT PCR (hospital order, performed in [facility] hospital lab) *cepheid single result test* Anterior Nasal Swab     Status: None   Collection Time: 12/18/22  4:41 AM   Specimen: Anterior Nasal Swab  Result Value Ref Range   SARS Coronavirus 2 by RT PCR NEGATIVE NEGATIVE    Comment: (NOTE) SARS-CoV-2 target nucleic acids are NOT DETECTED.  The SARS-CoV-2 RNA is generally detectable in upper and lower respiratory specimens during the acute phase of infection. The lowest concentration of SARS-CoV-2 viral copies this assay can detect is 250 copies / mL. A negative result does not preclude SARS-CoV-2 infection and should not be used as the sole basis for treatment or other patient management decisions.  A negative result may occur with improper specimen collection / handling, submission of specimen other than nasopharyngeal swab, presence of viral mutation(s) within the areas targeted by this assay, and inadequate number of viral copies (<250 copies / mL). A negative result must be combined with clinical observations, patient history, and epidemiological information.  Fact Sheet for Patients:   RoadLapTop.co.za  Fact Sheet for Healthcare Providers: http://kim-miller.com/  This test is not yet approved or  cleared by the [place] FDA and has been authorized for detection and/or diagnosis of SARS-CoV-2 by FDA under an Emergency Use  Authorization (EUA).  This EUA will remain in effect (meaning this test can be used) for the duration of the COVID-19 declaration under Section 564(b)(1) of the Act, 21 U.S.C. section 360bbb-3(b)(1), unless the authorization is terminated or revoked sooner.  Performed at [facility], [address], [place], [place] [address]     Current Facility-Administered Medications  Medication Dose Route Frequency Provider Last Rate Last Admin   amLODipine (NORVASC) tablet 10 mg  10 mg Oral Daily Motley-Mangrum, Elizette Shek A, PMHNP       folic acid (FOLVITE) tablet 1 mg  1 mg Oral Daily Tanda Rockers A, DO   1 mg at 12/18/22 1014   gabapentin (NEURONTIN) capsule 300 mg  300 mg Oral TID Motley-Mangrum, Delmy Holdren A, PMHNP       LORazepam (ATIVAN) tablet 1-4 mg  1-4 mg Oral Q1H PRN Tanda Rockers A, DO       Or   LORazepam (ATIVAN) injection 1-4 mg  1-4 mg Intravenous Q1H PRN Tanda Rockers A, DO   2 mg at 12/17/22 1949   multivitamin with minerals tablet 1 tablet  1 tablet Oral Daily Tanda Rockers A, DO   1 tablet at 12/18/22 1015   pantoprazole (PROTONIX) EC tablet 40 mg  40 mg Oral Daily Motley-Mangrum, Cathryne Mancebo A, PMHNP       thiamine (VITAMIN B1) tablet 100 mg  100 mg Oral Daily Tanda Rockers A, DO   100 mg at 12/18/22 1016   Or   thiamine (VITAMIN B1) injection 100 mg  100 mg Intravenous Daily Tanda Rockers A, DO   100 mg at 12/17/22 1950   [START ON 12/19/2022] venlafaxine XR (EFFEXOR-XR) 24 hr capsule 75 mg  75 mg Oral Q breakfast Motley-Mangrum, Ezra Sites, PMHNP       Current Outpatient Medications  Medication Sig Dispense Refill   amLODipine (NORVASC) 10 MG tablet TAKE 1 TABLET BY MOUTH DAILY 30 tablet 3   anastrozole (ARIMIDEX) 1 MG tablet Take 1 mg by mouth every Monday, Wednesday, and Friday.     gabapentin (NEURONTIN) 300 MG capsule Take 1 capsule (300  mg total) by mouth 3 (three) times daily. 90 capsule 0   omeprazole (PRILOSEC) 40 MG capsule TAKE ONE CAPSULE BY MOUTH DAILY 90  capsule 3   pantoprazole (PROTONIX) 40 MG tablet Take 1 tablet (40 mg total) by mouth daily after lunch. 30 tablet 0   testosterone cypionate (DEPOTESTOSTERONE CYPIONATE) 200 MG/ML injection Inject 100 mg into the muscle every Thursday.     valsartan-hydrochlorothiazide (DIOVAN-HCT) 320-25 MG tablet TAKE 1 TABLET BY MOUTH DAILY 30 tablet 3   venlafaxine XR (EFFEXOR-XR) 75 MG 24 hr capsule Take 1 capsule (75 mg total) by mouth daily with breakfast. 30 capsule 0    Musculoskeletal: Strength & Muscle Tone: within normal limits Gait & Station: normal Patient leans: N/A   Psychiatric Specialty Exam: Presentation  General Appearance:  Appropriate for Environment  Eye Contact: Good  Speech: Clear and Coherent  Speech Volume: Normal  Handedness: Right   Mood and Affect  Mood: Anxious; Depressed  Affect: Congruent   Thought Process  Thought Processes: Coherent  Descriptions of Associations:Intact  Orientation:Full (Time, Place and Person)  Thought Content:Logical  History of Schizophrenia/Schizoaffective disorder:No data recorded Duration of Psychotic Symptoms:No data recorded Hallucinations:Hallucinations: None  Ideas of Reference:None  Suicidal Thoughts:Suicidal Thoughts: Yes, Passive SI Passive Intent and/or Plan: With Plan  Homicidal Thoughts:Homicidal Thoughts: No   Sensorium  Memory: Immediate Good; Recent Good  Judgment: Intact  Insight: Fair   Art therapist  Concentration: Good  Attention Span: Good  Recall: Good  Fund of Knowledge: Good  Language: Good   Psychomotor Activity  Psychomotor Activity: Psychomotor Activity: Normal   Assets  Assets: Communication Skills; Housing; Health and safety inspector; Desire for Improvement    Sleep  Sleep: Sleep: Poor   Physical Exam: Physical Exam Musculoskeletal:     Cervical back: Normal range of motion.  Neurological:     Mental Status: He is alert.   Psychiatric:        Attention and Perception: Attention normal.        Mood and Affect: Mood is depressed.        Speech: Speech normal.        Behavior: Behavior is cooperative.        Thought Content: Thought content includes suicidal ideation. Thought content includes suicidal plan.        Cognition and Memory: Memory normal.        Judgment: Judgment is inappropriate.    Review of Systems  Constitutional: Negative.   Respiratory: Negative.    Musculoskeletal: Negative.   Psychiatric/Behavioral:  Positive for depression and suicidal ideas.    Blood pressure 96/61, pulse 68, temperature 98.4 F (36.9 C), temperature source Oral, resp. rate 17, height 5\' 8"  (1.727 m), weight 123.4 kg, SpO2 98 %. Body mass index is 41.36 kg/m.   Medical Decision Making: Patient lives alone, father completed suicide with a gun and has multiple stressors, patient case review and discussed with Dr. Lucianne Muss. Patient needs inpatient psychiatric admission for stabilization and treatment .  Will restart her home medications.  Will place TTS SW consult with social worker about bed placement and availability.   Disposition: Recommend psychiatric Inpatient admission when medically cleared.  Alona Bene, PMHNP 12/18/2022 6:37 PM

## 2022-12-18 NOTE — ED Notes (Signed)
Pt belongings placed in locker #36 in [facility].

## 2022-12-18 NOTE — ED Notes (Signed)
This pt has been cooperative during my shift this afternoon.  No problems to report

## 2022-12-18 NOTE — ED Notes (Signed)
Pt is cooperative at this time. Pt states it is hot in his room and requested if he could take his shirt off while he was in his room. Pt understand when he walks in the hallway he must put his shirt back on and has agreed to the terms.

## 2022-12-18 NOTE — ED Notes (Signed)
Pt breakfast arrived.

## 2022-12-19 ENCOUNTER — Encounter (HOSPITAL_COMMUNITY): Payer: Self-pay | Admitting: Psychiatry

## 2022-12-19 ENCOUNTER — Other Ambulatory Visit: Payer: Self-pay

## 2022-12-19 ENCOUNTER — Inpatient Hospital Stay (HOSPITAL_COMMUNITY)
Admission: AD | Admit: 2022-12-19 | Discharge: 2022-12-25 | DRG: 885 | Disposition: A | Discharge status: Home / Self Care | Payer: 59 | Source: Intra-hospital | Attending: Psychiatry | Admitting: Psychiatry

## 2022-12-19 DIAGNOSIS — F332 Major depressive disorder, recurrent severe without psychotic features: Secondary | ICD-10-CM | POA: Diagnosis present

## 2022-12-19 DIAGNOSIS — R0602 Shortness of breath: Secondary | ICD-10-CM | POA: Diagnosis present

## 2022-12-19 DIAGNOSIS — F1010 Alcohol abuse, uncomplicated: Secondary | ICD-10-CM | POA: Diagnosis present

## 2022-12-19 DIAGNOSIS — Z9151 Personal history of suicidal behavior: Secondary | ICD-10-CM | POA: Diagnosis not present

## 2022-12-19 DIAGNOSIS — G4730 Sleep apnea, unspecified: Secondary | ICD-10-CM | POA: Diagnosis present

## 2022-12-19 DIAGNOSIS — I10 Essential (primary) hypertension: Secondary | ICD-10-CM | POA: Diagnosis not present

## 2022-12-19 DIAGNOSIS — Z818 Family history of other mental and behavioral disorders: Secondary | ICD-10-CM | POA: Diagnosis not present

## 2022-12-19 DIAGNOSIS — Z6841 Body Mass Index (BMI) 40.0 and over, adult: Secondary | ICD-10-CM | POA: Diagnosis not present

## 2022-12-19 DIAGNOSIS — Z79899 Other long term (current) drug therapy: Secondary | ICD-10-CM | POA: Diagnosis not present

## 2022-12-19 DIAGNOSIS — R45851 Suicidal ideations: Secondary | ICD-10-CM

## 2022-12-19 DIAGNOSIS — Z833 Family history of diabetes mellitus: Secondary | ICD-10-CM | POA: Diagnosis not present

## 2022-12-19 DIAGNOSIS — M7989 Other specified soft tissue disorders: Secondary | ICD-10-CM | POA: Diagnosis present

## 2022-12-19 DIAGNOSIS — G4700 Insomnia, unspecified: Secondary | ICD-10-CM | POA: Diagnosis present

## 2022-12-19 DIAGNOSIS — R600 Localized edema: Secondary | ICD-10-CM | POA: Diagnosis not present

## 2022-12-19 DIAGNOSIS — K30 Functional dyspepsia: Secondary | ICD-10-CM | POA: Diagnosis present

## 2022-12-19 DIAGNOSIS — T39312A Poisoning by propionic acid derivatives, intentional self-harm, initial encounter: Secondary | ICD-10-CM | POA: Diagnosis present

## 2022-12-19 DIAGNOSIS — Y901 Blood alcohol level of 20-39 mg/100 ml: Secondary | ICD-10-CM | POA: Diagnosis present

## 2022-12-19 DIAGNOSIS — T50902A Poisoning by unspecified drugs, medicaments and biological substances, intentional self-harm, initial encounter: Secondary | ICD-10-CM

## 2022-12-19 DIAGNOSIS — K219 Gastro-esophageal reflux disease without esophagitis: Secondary | ICD-10-CM | POA: Diagnosis present

## 2022-12-19 DIAGNOSIS — Z79811 Long term (current) use of aromatase inhibitors: Secondary | ICD-10-CM

## 2022-12-19 DIAGNOSIS — Z20822 Contact with and (suspected) exposure to covid-19: Secondary | ICD-10-CM | POA: Diagnosis present

## 2022-12-19 LAB — POTASSIUM: Potassium: 3.5 mmol/L (ref 3.5–5.1)

## 2022-12-19 MED ORDER — LORAZEPAM 1 MG PO TABS: 1.0000 mg | ORAL_TABLET | ORAL | Status: DC | PRN

## 2022-12-19 MED ORDER — ACETAMINOPHEN 325 MG PO TABS
650.0000 mg | ORAL_TABLET | Freq: Four times a day (QID) | ORAL | Status: DC | PRN
  Administered 2022-12-23: 650 mg via ORAL
  Filled 2022-12-19: qty 2

## 2022-12-19 MED ORDER — ALUM & MAG HYDROXIDE-SIMETH 200-200-20 MG/5ML PO SUSP: 30.0000 mL | ORAL | Status: DC | PRN

## 2022-12-19 MED ORDER — TRAZODONE HCL 50 MG PO TABS
50.0000 mg | ORAL_TABLET | Freq: Every evening | ORAL | Status: DC | PRN
  Administered 2022-12-19 – 2022-12-20 (×2): 50 mg via ORAL
  Filled 2022-12-19 (×2): qty 1

## 2022-12-19 MED ORDER — MAGNESIUM HYDROXIDE 400 MG/5ML PO SUSP: 30.0000 mL | Freq: Every day | ORAL | Status: DC | PRN

## 2022-12-19 MED ORDER — ZIPRASIDONE MESYLATE 20 MG IM SOLR: 20.0000 mg | INTRAMUSCULAR | Status: DC | PRN

## 2022-12-19 MED ORDER — OLANZAPINE 10 MG PO TBDP: 10.0000 mg | ORAL_TABLET | Freq: Three times a day (TID) | ORAL | Status: DC | PRN

## 2022-12-19 NOTE — Progress Notes (Addendum)
Joshua Parrish 47 year old male IVC admitted from [facility] ED due to intentional overdose. Per patient, he stated "I overdosed on my bottles of Aspirin, Tylenol, and Ibuprofen and I took "rum and coke with [place] (vanilla flavored) " along with the pills.He stated this occurred this past Wednesday around 4:15pm. Patient stated "I read online it would give you a heart attack." Per patient, after discharging from [facility] on November 16, 2022, nobody checked on him including friends and family. Patient started feeling lonely and like he had no one. Patient also tried dating around and he got "rejected and called fat." Patient attempted to join social groups that would meet up with similar interests but stated they did not let him join. Patient stated he also kept up with medications until it was time for a refill, pharmacy told him it was "pending approval from provider." Patient stated he has been off his medication since last Friday and his anxiety only got worse from that point on. Patient stated after attempting to receive his medication, he came back to his apartment and saw the eviction notice on the door and felt so defeated that he slumped down on the floor and started crying. Patient stated he did not pay his rent while he was inpatient last time and he thought they would work with him financially instead of evicting him. Patient states that same day is when he overdosed instead of going to his appointment with his therapist (Amy Jean Rosenthal at [facility]) and all his belongings are still inside his apartment. Patient states he has a court case for his eviction on April 24th. Patient states one thing that did help him was joining the gym and has lost weight. Patient reported the gym was helping him feel a little better and is currently still employed at [employer] which he has worked at for 24 years.Patient stated he felt like he could really use some support and did not know who turn to as he loves his  mother but states he has realized she is also a part of why he feels the way he does.   Currently patient denies SI,HI, and A/V/H with no plan or intent but endorses high anxiety and depression. Patient is cooperative, tearful, and pleasant. Patient's potassium level was at 2.8 and potassium was given to patient prior to admission with labs collected. Patient's current potassium level WDL at 3.5. Patient's current acetaminophen level 56. Patient asymptomatic. Vs stable. Patient hopes to get back on his medication and gain control over his anxiety. Patient oriented to unit and unit rules. Patient provided with meal. No s/s of current distress.

## 2022-12-19 NOTE — [facility] Group Notes (Signed)
[facility] Group Notes:  (Nursing/MHT/Case Management/Adjunct)  Date:  12/19/2022  Time:  8:14 PM  Type of Therapy:   AA  Participation Level:  Active  Participation Quality:  Appropriate  Affect:  Appropriate  Cognitive:  Appropriate  Insight:  Appropriate  Engagement in Group:  Engaged  Modes of Intervention:  Education  Summary of Progress/Problems:  Joshua Parrish 12/19/2022, 8:14 PM

## 2022-12-19 NOTE — Progress Notes (Cosign Needed Addendum)
[facility] Psych ED Progress Note  12/19/2022 10:28 AM TAD FANCHER  MRN:  [id number]   Principal Problem: Major depressive disorder, recurrent episode, severe Diagnosis:  Principal Problem:   Major depressive disorder, recurrent episode, severe Active Problems:   Suicidal ideation   ED Assessment Time Calculation: Start Time: 0930 Stop Time: 0945 Total Time in Minutes (Assessment Completion): 15    Subjective:  On evaluation today, the patient is laying in his bed, in no acute distress. He is calm and cooperative during this assessment. His appearance is appropriate for environment. His eye contact is good.  Speech is clear and coherent, normal pace and normal volume. He reports his mood is "a little better than yesterday". Affect is congruent with mood.  Thought process is coherent.  Thought content is within normal limits. He denies auditory and visual hallucinations.  No indication that he is responding to internal stimuli during this assessment. No delusions elicited during this assessment. He denies homicidal ideations. Continues to endorse SI, now states he has no plan, he didn't have a plan, says he is just depressed, and is having feelings of despair and hopelessness.  Support, encouragement and reassurance provided about ongoing stressors and patient provided with opportunity for questions.   Past Psychiatric History: previous hx anxiety, insomnia and depression. No mental health inpatient hospitalization. Sherril Cong is Psychiatrist while Amy Jean Rosenthal is therapist.    [place] Scale:  Flowsheet Row ED from 12/17/2022 in [facility] Emergency Department at [facility] Admission (Discharged) from 11/12/2022 in [facility] INPATIENT ADULT 400B ED from 11/11/2022 in [facility] Emergency Department at [facility]  C-SSRS RISK CATEGORY High Risk No Risk High Risk       Past Medical History:  Past Medical History:  Diagnosis Date   Anxiety    Chicken pox     Depression    Fatty liver    GERD (gastroesophageal reflux disease)    H/O hernia repair 03/31/2022   Hypertension    Migraines    Osteoarthritis of shoulder    left   Rotator cuff impingement syndrome of left shoulder 06/19/2015   Sinusitis    Sleep apnea     Past Surgical History:  Procedure Laterality Date   APPENDECTOMY     CERVICAL LAMINECTOMY     has plates and fusion   CHOLECYSTECTOMY N/A 12/24/2015   Procedure: LAPAROSCOPIC CHOLECYSTECTOMY ;  Surgeon: Gaynelle Adu, MD;  Location: [facility];  Service: General;  Laterality: N/A;   CLAVICLE EXCISION Left    HYPOSPADIAS CORRECTION     as child   KNEE SURGERY Bilateral    multiple   SHOULDER ARTHROSCOPY WITH ROTATOR CUFF REPAIR Left 06/26/2015   Procedure: SHOULDER ARTHROSCOPY WITH ROTATOR CUFF REPAIR;  Surgeon: Salvatore Marvel, MD;  Location: [facility];  Service: Orthopedics;  Laterality: Left;   Family History:  Family History  Problem Relation Age of Onset   Arthritis Mother    Ovarian cancer Mother    Alcohol abuse Father    Breast cancer Maternal Uncle    Breast cancer Maternal Grandmother    Cancer Maternal Grandmother        spinal surgery   Cancer Other    Hypertension Other    Diabetes Other    Breast cancer Maternal Great-grandfather    Colon cancer Neg Hx    Sleep apnea Neg Hx    Family Psychiatric  History: Patient father committed suicide   Social History:  Social History  Substance and Sexual Activity  Alcohol Use Yes   Alcohol/week: 0.0 standard drinks of alcohol   Comment: occasional     Social History   Substance and Sexual Activity  Drug Use No    Social History   Socioeconomic History   Marital status: Divorced    Spouse name: Not on file   Number of children: 1   Years of education: 12   Highest education level: Not on file  Occupational History   Occupation: Garage Administrator, sports   Tobacco Use   Smoking status: Never   Smokeless tobacco: Current    Types:  Chew   Tobacco comments:    form given 11/29/15  Vaping Use   Vaping Use: Never used  Substance and Sexual Activity   Alcohol use: Yes    Alcohol/week: 0.0 standard drinks of alcohol    Comment: occasional   Drug use: No   Sexual activity: Yes  Other Topics Concern   Not on file  Social History Narrative   Fun: Lift weights, hike   Social Determinants of Health   Financial Resource Strain: Not on file  Food Insecurity: No Food Insecurity (11/12/2022)   Hunger Vital Sign    Worried About Running Out of Food in the Last Year: Never true    Ran Out of Food in the Last Year: Never true  Transportation Needs: No Transportation Needs (11/12/2022)   PRAPARE - Administrator, Civil Service (Medical): No    Lack of Transportation (Non-Medical): No  Physical Activity: Not on file  Stress: Not on file  Social Connections: Not on file    Sleep: Fair  Appetite:  Fair  Current Medications: Current Facility-Administered Medications  Medication Dose Route Frequency Provider Last Rate Last Admin   amLODipine (NORVASC) tablet 10 mg  10 mg Oral Daily Motley-Mangrum, Tanairy Payeur A, PMHNP   10 mg at 12/19/22 0917   folic acid (FOLVITE) tablet 1 mg  1 mg Oral Daily Tanda Rockers A, DO   1 mg at 12/19/22 0917   gabapentin (NEURONTIN) capsule 300 mg  300 mg Oral TID Motley-Mangrum, Dalasia Predmore A, PMHNP   300 mg at 12/19/22 0917   LORazepam (ATIVAN) tablet 1-4 mg  1-4 mg Oral Q1H PRN Tanda Rockers A, DO   1 mg at 12/18/22 2153   Or   LORazepam (ATIVAN) injection 1-4 mg  1-4 mg Intravenous Q1H PRN Tanda Rockers A, DO   2 mg at 12/17/22 1949   multivitamin with minerals tablet 1 tablet  1 tablet Oral Daily Tanda Rockers A, DO   1 tablet at 12/19/22 0917   pantoprazole (PROTONIX) EC tablet 40 mg  40 mg Oral Daily Motley-Mangrum, Martavious Hartel A, PMHNP   40 mg at 12/19/22 0865   thiamine (VITAMIN B1) tablet 100 mg  100 mg Oral Daily Tanda Rockers A, DO   100 mg at 12/19/22 7846   Or   thiamine (VITAMIN B1)  injection 100 mg  100 mg Intravenous Daily Tanda Rockers A, DO   100 mg at 12/17/22 1950   venlafaxine XR (EFFEXOR-XR) 24 hr capsule 75 mg  75 mg Oral Q breakfast Motley-Mangrum, Daven Pinckney A, PMHNP   75 mg at 12/19/22 9629   Current Outpatient Medications  Medication Sig Dispense Refill   amLODipine (NORVASC) 10 MG tablet TAKE 1 TABLET BY MOUTH DAILY 30 tablet 3   anastrozole (ARIMIDEX) 1 MG tablet Take 1 mg by mouth every Monday, Wednesday, and Friday.     gabapentin (NEURONTIN)  300 MG capsule Take 1 capsule (300 mg total) by mouth 3 (three) times daily. 90 capsule 0   omeprazole (PRILOSEC) 40 MG capsule TAKE ONE CAPSULE BY MOUTH DAILY 90 capsule 3   pantoprazole (PROTONIX) 40 MG tablet Take 1 tablet (40 mg total) by mouth daily after lunch. 30 tablet 0   testosterone cypionate (DEPOTESTOSTERONE CYPIONATE) 200 MG/ML injection Inject 100 mg into the muscle every Thursday.     valsartan-hydrochlorothiazide (DIOVAN-HCT) 320-25 MG tablet TAKE 1 TABLET BY MOUTH DAILY 30 tablet 3   venlafaxine XR (EFFEXOR-XR) 75 MG 24 hr capsule Take 1 capsule (75 mg total) by mouth daily with breakfast. 30 capsule 0    Lab Results:  Results for orders placed or performed during the hospital encounter of 12/17/22 (from the past 48 hour(s))  Urine rapid drug screen (hosp performed)     Status: None   Collection Time: 12/17/22  6:11 PM  Result Value Ref Range   Opiates NONE DETECTED NONE DETECTED   Cocaine NONE DETECTED NONE DETECTED   Benzodiazepines NONE DETECTED NONE DETECTED   Amphetamines NONE DETECTED NONE DETECTED   Tetrahydrocannabinol NONE DETECTED NONE DETECTED   Barbiturates NONE DETECTED NONE DETECTED    Comment: (NOTE) DRUG SCREEN FOR MEDICAL PURPOSES ONLY.  IF CONFIRMATION IS NEEDED FOR ANY PURPOSE, NOTIFY LAB WITHIN 5 DAYS.  LOWEST DETECTABLE LIMITS FOR URINE DRUG SCREEN Drug Class                     Cutoff (ng/mL) Amphetamine and metabolites    1000 Barbiturate and metabolites     200 Benzodiazepine                 200 Opiates and metabolites        300 Cocaine and metabolites        300 THC                            50 Performed at [facility], [address] [place], [place] [address]   CBG monitoring, ED     Status: Abnormal   Collection Time: 12/17/22  6:38 PM  Result Value Ref Range   Glucose-Capillary 109 (H) 70 - 99 mg/dL    Comment: Glucose reference range applies only to samples taken after fasting for at least 8 hours.  Comprehensive metabolic panel     Status: Abnormal   Collection Time: 12/17/22  6:43 PM  Result Value Ref Range   Sodium 136 135 - 145 mmol/L   Potassium 2.8 (L) 3.5 - 5.1 mmol/L   Chloride 99 98 - 111 mmol/L   CO2 22 22 - 32 mmol/L   Glucose, Bld 107 (H) 70 - 99 mg/dL    Comment: Glucose reference range applies only to samples taken after fasting for at least 8 hours.   BUN 20 6 - 20 mg/dL   Creatinine, Ser 4.69 (H) 0.61 - 1.24 mg/dL   Calcium 9.5 8.9 - 62.9 mg/dL   Total Protein 7.4 6.5 - 8.1 g/dL   Albumin 4.5 3.5 - 5.0 g/dL   AST 29 15 - 41 U/L   ALT 47 (H) 0 - 44 U/L   Alkaline Phosphatase 73 38 - 126 U/L   Total Bilirubin 0.7 0.3 - 1.2 mg/dL   GFR, Estimated >52 >84 mL/min    Comment: (NOTE) Calculated using the CKD-EPI Creatinine Equation (2021)    Anion gap 15 5 -  15    Comment: Performed at [facility], [address], [place], [place] [address]  Salicylate level     Status: Abnormal   Collection Time: 12/17/22  6:43 PM  Result Value Ref Range   Salicylate Lvl <7.0 (L) 7.0 - 30.0 mg/dL    Comment: Performed at [facility], [address], [place], [place] [address]  Acetaminophen level     Status: Abnormal   Collection Time: 12/17/22  6:43 PM  Result Value Ref Range   Acetaminophen (Tylenol), Serum 148 (H) 10 - 30 ug/mL    Comment: (NOTE) Therapeutic concentrations vary significantly. A range of 10-30 ug/mL  may be an effective concentration for many  patients. However, some  are best treated at concentrations outside of this range. Acetaminophen concentrations >150 ug/mL at 4 hours after ingestion  and >50 ug/mL at 12 hours after ingestion are often associated with  toxic reactions.  Performed at [facility], [address], [place], [place] [address]   Ethanol     Status: Abnormal   Collection Time: 12/17/22  6:43 PM  Result Value Ref Range   Alcohol, Ethyl (B) 28 (H) <10 mg/dL    Comment: (NOTE) Lowest detectable limit for serum alcohol is 10 mg/dL.  For medical purposes only. Performed at [facility], [address] [place], [place] [address]   CBC WITH DIFFERENTIAL     Status: Abnormal   Collection Time: 12/17/22  6:43 PM  Result Value Ref Range   WBC 12.3 (H) 4.0 - 10.5 K/uL   RBC 5.41 4.22 - 5.81 MIL/uL   Hemoglobin 15.9 13.0 - 17.0 g/dL   HCT 65.7 84.6 - 96.2 %   MCV 87.8 80.0 - 100.0 fL   MCH 29.4 26.0 - 34.0 pg   MCHC 33.5 30.0 - 36.0 g/dL   RDW 95.2 84.1 - 32.4 %   Platelets 436 (H) 150 - 400 K/uL   nRBC 0.0 0.0 - 0.2 %   Neutrophils Relative % 65 %   Neutro Abs 7.8 (H) 1.7 - 7.7 K/uL   Lymphocytes Relative 28 %   Lymphs Abs 3.5 0.7 - 4.0 K/uL   Monocytes Relative 7 %   Monocytes Absolute 0.9 0.1 - 1.0 K/uL   Eosinophils Relative 0 %   Eosinophils Absolute 0.0 0.0 - 0.5 K/uL   Basophils Relative 0 %   Basophils Absolute 0.0 0.0 - 0.1 K/uL   Immature Granulocytes 0 %   Abs Immature Granulocytes 0.05 0.00 - 0.07 K/uL    Comment: Performed at [facility], [address] [place], [place] [address]  Acetaminophen level     Status: Abnormal   Collection Time: 12/17/22 10:00 PM  Result Value Ref Range   Acetaminophen (Tylenol), Serum 56 (H) 10 - 30 ug/mL    Comment: (NOTE) Therapeutic concentrations vary significantly. A range of 10-30 ug/mL  may be an effective concentration for many patients. However, some  are best treated at concentrations  outside of this range. Acetaminophen concentrations >150 ug/mL at 4 hours after ingestion  and >50 ug/mL at 12 hours after ingestion are often associated with  toxic reactions.  Performed at [facility], [address] [place], [place] [address]   SARS Coronavirus 2 by RT PCR (hospital order, performed in [facility] hospital lab) *cepheid single result test* Anterior Nasal Swab     Status: None   Collection Time: 12/18/22  4:41 AM  Specimen: Anterior Nasal Swab  Result Value Ref Range   SARS Coronavirus 2 by RT PCR NEGATIVE NEGATIVE    Comment: (NOTE) SARS-CoV-2 target nucleic acids are NOT DETECTED.  The SARS-CoV-2 RNA is generally detectable in upper and lower respiratory specimens during the acute phase of infection. The lowest concentration of SARS-CoV-2 viral copies this assay can detect is 250 copies / mL. A negative result does not preclude SARS-CoV-2 infection and should not be used as the sole basis for treatment or other patient management decisions.  A negative result may occur with improper specimen collection / handling, submission of specimen other than nasopharyngeal swab, presence of viral mutation(s) within the areas targeted by this assay, and inadequate number of viral copies (<250 copies / mL). A negative result must be combined with clinical observations, patient history, and epidemiological information.  Fact Sheet for Patients:   RoadLapTop.co.za  Fact Sheet for Healthcare Providers: http://kim-miller.com/  This test is not yet approved or  cleared by the [place] FDA and has been authorized for detection and/or diagnosis of SARS-CoV-2 by FDA under an Emergency Use Authorization (EUA).  This EUA will remain in effect (meaning this test can be used) for the duration of the COVID-19 declaration under Section 564(b)(1) of the Act, 21 U.S.C. section 360bbb-3(b)(1), unless the  authorization is terminated or revoked sooner.  Performed at [facility], [address] [place], [place] [address]     Blood Alcohol level:  Lab Results  Component Value Date   ETH 28 (H) 12/17/2022   ETH 23 (H) 11/11/2022    Physical Findings:  CIWA:  CIWA-Ar Total: 14 COWS:     Musculoskeletal: Strength & Muscle Tone: within normal limits Gait & Station: normal Patient leans: N/A  Psychiatric Specialty Exam:  Presentation  General Appearance:  Appropriate for Environment  Eye Contact: Good  Speech: Clear and Coherent  Speech Volume: Normal  Handedness: Right   Mood and Affect  Mood: Depressed; Hopeless; Euthymic  Affect: Congruent   Thought Process  Thought Processes: Coherent  Descriptions of Associations:Intact  Orientation:Full (Time, Place and Person)  Thought Content:Logical  History of Schizophrenia/Schizoaffective disorder:No data recorded Duration of Psychotic Symptoms:No data recorded Hallucinations:Hallucinations: None  Ideas of Reference:None  Suicidal Thoughts:Suicidal Thoughts: Yes, Passive SI Passive Intent and/or Plan: With Plan  Homicidal Thoughts:Homicidal Thoughts: No   Sensorium  Memory: Immediate Good; Recent Good  Judgment: Intact  Insight: Fair   Art therapist  Concentration: Good  Attention Span: Good  Recall: Good  Fund of Knowledge: Good  Language: Good   Psychomotor Activity  Psychomotor Activity: Psychomotor Activity: Normal   Assets  Assets: Communication Skills; Desire for Improvement; Financial Resources/Insurance; Housing; Social Support   Sleep  Sleep: Sleep: Fair    Physical Exam: Physical Exam Musculoskeletal:        General: Normal range of motion.     Cervical back: Normal range of motion.  Neurological:     Mental Status: He is alert.  Psychiatric:        Attention and Perception: Attention normal.        Mood and Affect: Mood  is anxious and depressed. Affect is flat.        Speech: Speech normal.        Behavior: Behavior is cooperative.        Thought Content: Thought content includes suicidal ideation.        Cognition and Memory: Memory normal.        Judgment:  Judgment is inappropriate.    Review of Systems  Constitutional: Negative.   HENT: Negative.    Psychiatric/Behavioral:  Positive for depression and suicidal ideas.    Blood pressure 126/81, pulse 76, temperature 98.2 F (36.8 C), temperature source Oral, resp. rate 16, height 5\' 8"  (1.727 m), weight 123.4 kg, SpO2 95 %. Body mass index is 41.36 kg/m.    Medical Decision Making: Patient continues to require inpatient Psychiatric hospitalization. [facility] accepted to [facility] TODAY 12/19/2022; Bed Assignment 307-2. Patient K+ was 2.8 on admission, patient given potassium chloride 80 mEq, Potassium repeat lab drawn, in process, patient able to transfer to [facility] while repeat lab in process per [facility].       Kaysan Peixoto MOTLEY-MANGRUM, PMHNP 12/19/2022, 10:28 AM

## 2022-12-19 NOTE — Progress Notes (Signed)
Pt was accepted to [facility] TODAY 12/19/2022. Bed assignment: 307-2  Pt meets inpatient criteria per Alona Bene, PMHNP  Attending Physician will be Phineas Inches, MD  Report can be called to: - Adult unit: [phone]  Bed is ready now  Care Team Notified: [facility], RN, Esmond Harps, RN, Roseanne Reno, RN, and [facility], PMHNP  Cathie Beams, [place]  12/19/2022 10:28 AM

## 2022-12-19 NOTE — ED Notes (Signed)
Pt belongings sent with police transport to [facility].

## 2022-12-19 NOTE — ED Provider Notes (Signed)
Emergency Medicine Observation Re-evaluation Note  Joshua Parrish is a 47 y.o. male, seen on rounds today.  Pt initially presented to the ED for complaints of Suicidal Currently, the patient is resting.  Physical Exam  BP 101/65   Pulse 68   Temp 98 F (36.7 C)   Resp 16   Ht 1.727 m ( )   Wt 123.4 kg   SpO2 99%   BMI 41.36 kg/m  Physical Exam  ED Course / MDM  EKG:EKG Interpretation  Date/Time:  Wednesday December 17 2022 18:18:59 EDT Ventricular Rate:  116 PR Interval:  134 QRS Duration: 90 QT Interval:  336 QTC Calculation: 467 R Axis:   67 Text Interpretation: Sinus tachycardia Borderline repolarization abnormality Confirmed by Ross Marcus ([id number]) on 12/18/2022 8:09:02 PM  I have reviewed the labs performed to date as well as medications administered while in observation.  Recent changes in the last 24 hours include to behavioral hospital today.  Plan  Current plan is for placement today.    Lorre Nick, MD 12/19/22 [phone]

## 2022-12-19 NOTE — Tx Team (Signed)
Initial Treatment Plan 12/19/2022 1:21 PM Joshua Parrish [id number]    PATIENT STRESSORS: Financial difficulties   Legal issue   Marital or family conflict   Medication change or noncompliance     PATIENT STRENGTHS: Capable of independent living  Motivation for treatment/growth  Special hobby/interest  Work skills    PATIENT IDENTIFIED PROBLEMS: Anxiety  Depression  Legal issue  Medication change or non compliance               DISCHARGE CRITERIA:  Adequate post-discharge living arrangements Improved stabilization in mood, thinking, and/or behavior  PRELIMINARY DISCHARGE PLAN: Return to previous living arrangement  PATIENT/FAMILY INVOLVEMENT: This treatment plan has been presented to and reviewed with the patient, Joshua Parrish. The patient has been given the opportunity to ask questions and make suggestions.  Roseanne Reno, RN 12/19/2022, 1:21 PM

## 2022-12-19 NOTE — Progress Notes (Signed)
Pt was accepted to [facility] TODAY 12/19/2022; Bed Assignment 307-2  DX: Overdosing on drug; intentional self-harm.    Junior Arsenio Katz, RN advised that IVC paperwork has been faxed to [facility] [phone]  Pt meets inpatient criteria per Alona Bene, PMHNP  Attending Physician will be Dr. Phineas Inches, MD  Report can be called to: -Adult unit: [phone]  Pt can arrive after: Day [facility] will coordinate with care team.  Care Team notified: Night [facility] Fransico Michael, RN, Day [facility] [place], RN, Sindy Guadeloupe, NP, Registration Allen Kell, Junior Arsenio Katz, RN, Registration [place], Kiara Riddick-[place], [place], RN, [place], [place], [place], LCSWA  Marianne, [place] 12/19/2022 @ 12:44 AM

## 2022-12-20 DIAGNOSIS — F332 Major depressive disorder, recurrent severe without psychotic features: Secondary | ICD-10-CM | POA: Diagnosis not present

## 2022-12-20 MED ORDER — PANTOPRAZOLE SODIUM 40 MG PO TBEC
40.0000 mg | DELAYED_RELEASE_TABLET | Freq: Every day | ORAL | Status: DC
  Administered 2022-12-20 – 2022-12-25 (×6): 40 mg via ORAL
  Filled 2022-12-20 (×10): qty 1

## 2022-12-20 MED ORDER — TESTOSTERONE CYPIONATE 200 MG/ML IM SOLN
100.0000 mg | INTRAMUSCULAR | Status: DC
  Administered 2022-12-25: 100 mg via INTRAMUSCULAR
  Filled 2022-12-20: qty 1

## 2022-12-20 MED ORDER — VALSARTAN-HYDROCHLOROTHIAZIDE 320-25 MG PO TABS: 1.0000 | ORAL_TABLET | Freq: Every day | ORAL | Status: DC

## 2022-12-20 MED ORDER — LOSARTAN POTASSIUM 50 MG PO TABS
100.0000 mg | ORAL_TABLET | Freq: Every day | ORAL | Status: DC
  Administered 2022-12-20 – 2022-12-25 (×6): 100 mg via ORAL
  Filled 2022-12-20 (×9): qty 2

## 2022-12-20 MED ORDER — HYDROCHLOROTHIAZIDE 25 MG PO TABS
25.0000 mg | ORAL_TABLET | Freq: Every day | ORAL | Status: DC
  Administered 2022-12-20 – 2022-12-25 (×6): 25 mg via ORAL
  Filled 2022-12-20 (×9): qty 1

## 2022-12-20 MED ORDER — AMLODIPINE BESYLATE 10 MG PO TABS
10.0000 mg | ORAL_TABLET | Freq: Every day | ORAL | Status: DC
  Administered 2022-12-20 – 2022-12-24 (×5): 10 mg via ORAL
  Filled 2022-12-20 (×4): qty 1
  Filled 2022-12-20: qty 2
  Filled 2022-12-20 (×3): qty 1

## 2022-12-20 MED ORDER — HYDROXYZINE HCL 25 MG PO TABS: 25.0000 mg | ORAL_TABLET | ORAL | Status: DC | PRN

## 2022-12-20 MED ORDER — VENLAFAXINE HCL ER 75 MG PO CP24
75.0000 mg | ORAL_CAPSULE | Freq: Every day | ORAL | Status: DC
  Administered 2022-12-21 – 2022-12-25 (×5): 75 mg via ORAL
  Filled 2022-12-20 (×8): qty 1

## 2022-12-20 MED ORDER — GABAPENTIN 300 MG PO CAPS
300.0000 mg | ORAL_CAPSULE | Freq: Three times a day (TID) | ORAL | Status: DC
  Administered 2022-12-20 – 2022-12-24 (×13): 300 mg via ORAL
  Filled 2022-12-20 (×21): qty 1
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
## 2022-12-20 NOTE — Progress Notes (Signed)
   12/20/22 2237  Psych Admission Type (Psych Patients Only)  Admission Status Involuntary  Psychosocial Assessment  Patient Complaints Anxiety  Eye Contact Fair  Facial Expression Anxious  Affect Appropriate to circumstance  Speech Logical/coherent  Interaction Assertive  Motor Activity Other (Comment) (WDL)  Appearance/Hygiene Unremarkable  Behavior Characteristics Appropriate to situation  Mood Anxious;Pleasant  Thought Process  Coherency WDL  Content WDL  Delusions None reported or observed  Perception WDL  Hallucination None reported or observed  Judgment Impaired  Confusion None  Danger to Self  Current suicidal ideation? Denies  Agreement Not to Harm Self Yes  Description of Agreement verbal  Danger to Others  Danger to Others None reported or observed

## 2022-12-20 NOTE — [facility] Suicide Risk Assessment (Signed)
Suicide Risk Assessment  Admission Assessment    [facility] Admission Suicide Risk Assessment   Nursing information obtained from:  Patient  Demographic factors:  Male, Divorced or widowed, Living alone, Low socioeconomic status  Current Mental Status:  Suicidal ideation indicated by patient, Self-harm behaviors  Loss Factors:  Loss of significant relationship, Legal issues, Financial problems / change in socioeconomic status  Historical Factors:  Prior suicide attempts, Family history of mental illness or substance abuse  Risk Reduction Factors:  Employed  Total Time spent with patient: 1 hour  Principal Problem: Suicidal ideations & attempt by overdose on Ibuprofen.  Diagnosis:  Principal Problem:   Suicidal ideations  Subjective Data: See H&P  Continued Clinical Symptoms:  Alcohol Use Disorder Identification Test Final Score (AUDIT): 2 The "Alcohol Use Disorders Identification Test", Guidelines for Use in Primary Care, Second Edition.  World Science writer [facility]). Score between 0-7:  no or low risk or alcohol related problems. Score between 8-15:  moderate risk of alcohol related problems. Score between 16-19:  high risk of alcohol related problems. Score 20 or above:  warrants further diagnostic evaluation for alcohol dependence and treatment.  CLINICAL FACTORS:   Severe Anxiety and/or Agitation Depression:   Impulsivity Insomnia Alcohol/Substance Abuse/Dependencies More than one psychiatric diagnosis Unstable or Poor Therapeutic Relationship Previous Psychiatric Diagnoses and Treatments   Musculoskeletal: Strength & Muscle Tone: within normal limits Gait & Station: normal Patient leans: N/A  Psychiatric Specialty Exam:  Presentation  General Appearance:  Casual; Fairly Groomed  Eye Contact: Good  Speech: Clear and Coherent; Normal Rate  Speech Volume: Normal  Handedness: Right   Mood and Affect  Mood: Depressed;  Anxious  Affect: Congruent   Thought Process  Thought Processes: Coherent; Goal Directed; Linear  Descriptions of Associations:Intact  Orientation:Full (Time, Place and Person)  Thought Content:Logical  History of Schizophrenia/Schizoaffective disorder:No data recorded  Duration of Psychotic Symptoms:No data recorded  Hallucinations:Hallucinations: None  Ideas of Reference:None  Suicidal Thoughts:Suicidal Thoughts: No  Homicidal Thoughts:Homicidal Thoughts: No   Sensorium  Memory: Immediate Good; Recent Good; Remote Good  Judgment: Fair  Insight: Fair   Art therapist  Concentration: Good  Attention Span: Good  Recall: Good  Fund of Knowledge: Fair  Language: Good  Psychomotor Activity  Psychomotor Activity: Psychomotor Activity: Normal  Assets  Assets: Communication Skills; Desire for Improvement; Physical Health; Resilience  Sleep  Sleep: Sleep: Good Number of Hours of Sleep: 7.5  Physical Exam: See H&P Blood pressure 122/70, pulse 63, temperature 98.1 F (36.7 C), temperature source Oral, resp. rate 20, height  (1.727 m), weight 126.7 kg, SpO2 97 %. Body mass index is 42.48 kg/m.  COGNITIVE FEATURES THAT CONTRIBUTE TO RISK:  Closed-mindedness, Polarized thinking, and Thought constriction (tunnel vision)    SUICIDE RISK:   Severe:  Frequent, intense, and enduring suicidal ideation, specific plan, no subjective intent, but some objective markers of intent (i.e., choice of lethal method), the method is accessible, some limited preparatory behavior, evidence of impaired self-control, severe dysphoria/symptomatology, multiple risk factors present, and few if any protective factors, particularly a lack of social support.  PLAN OF CARE: See H&P.  I certify that inpatient services furnished can reasonably be expected to improve the patient's condition.   Armandina Stammer, NP, pmhnp, fnp-bc 12/20/2022, 10:20 AM

## 2022-12-20 NOTE — Progress Notes (Signed)
   12/20/22 0800  Psych Admission Type (Psych Patients Only)  Admission Status Involuntary  Psychosocial Assessment  Patient Complaints Anxiety;Depression  Eye Contact Fair  Facial Expression Anxious  Affect Blunted  Speech Logical/coherent  Interaction Assertive  Motor Activity Other (Comment) (WDL)  Appearance/Hygiene Unremarkable  Behavior Characteristics Cooperative  Mood Depressed;Anxious;Pleasant  Thought Process  Coherency WDL  Content WDL  Delusions None reported or observed  Perception WDL  Hallucination None reported or observed  Judgment Impaired  Confusion None  Danger to Self  Current suicidal ideation? Denies  Danger to Others  Danger to Others None reported or observed

## 2022-12-20 NOTE — Progress Notes (Signed)
   12/19/22 2235  Psych Admission Type (Psych Patients Only)  Admission Status Involuntary  Psychosocial Assessment  Patient Complaints Anxiety;Depression;Worrying  Eye Contact Fair  Facial Expression Anxious  Affect Appropriate to circumstance  Speech Logical/coherent  Interaction Assertive  Motor Activity Slow  Appearance/Hygiene Unremarkable  Behavior Characteristics Cooperative;Anxious  Mood Depressed;Anxious;Pleasant  Thought Process  Coherency WDL  Content WDL  Delusions None reported or observed  Perception WDL  Hallucination None reported or observed  Judgment Impaired  Confusion None  Danger to Self  Current suicidal ideation? Denies  Agreement Not to Harm Self Yes  Description of Agreement Verbal contract for safety  Danger to Others  Danger to Others None reported or observed   Pt reports 6/10 anxiety and 0/10 depression. Pt was offered support and encouragement. Pt is pleasant and cooperative. Pt was given PRN Trazodone per MAR. Q 15 minute checks were done for safety. Pt attends groups and interacts well with peers and staff. Pt has no complaints.Pt receptive to treatment and safety maintained on unit.

## 2022-12-20 NOTE — Group Note (Signed)
Date:  12/20/2022 Time:  1:01 PM  Group Topic/Focus:  Orientation:   The focus of this group is to educate the patient on the purpose and policies of crisis stabilization and provide a format to answer questions about their admission.  The group details unit policies and expectations of patients while admitted.    Participation Level:  Active  Participation Quality:  Appropriate  Affect:  Appropriate  Cognitive:  Appropriate  Insight: Appropriate  Engagement in Group:  Engaged  Modes of Intervention:  Discussion  Additional Comments:     Reymundo Poll 12/20/2022, 1:01 PM

## 2022-12-20 NOTE — Plan of Care (Signed)
  Problem: Activity: Goal: Interest or engagement in activities will improve Outcome: Progressing   

## 2022-12-20 NOTE — Group Note (Signed)
Date:  12/20/2022 Time:  1:02 PM  Group Topic/Focus:  Dimensions of Wellness:   The focus of this group is to introduce the topic of wellness and discuss the role each dimension of wellness plays in total health.    Participation Level:  Active  Participation Quality:  Appropriate  Affect:  Appropriate  Cognitive:  Appropriate  Insight: Appropriate  Engagement in Group:  Engaged  Modes of Intervention:  Exploration  Additional Comments:     Reymundo Poll 12/20/2022, 1:02 PM

## 2022-12-20 NOTE — [facility] Group Notes (Signed)
[facility] Group Notes:  (Nursing/MHT/Case Management/Adjunct)  Date:  12/20/2022  Time:  9:02 PM  Type of Therapy:  Group Therapy  Participation Level:  Active  Participation Quality:  Appropriate  Affect:  Appropriate  Cognitive:  Appropriate  Insight:  Appropriate  Engagement in Group:  Engaged  Modes of Intervention:  Education  Summary of Progress/Problems: Goal to socialize more. Day 8/10. Participated in anger management exercise.  Joshua Parrish 12/20/2022, 9:02 PM
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
## 2022-12-21 DIAGNOSIS — F332 Major depressive disorder, recurrent severe without psychotic features: Secondary | ICD-10-CM | POA: Diagnosis not present

## 2022-12-21 MED ORDER — TRAZODONE HCL 100 MG PO TABS
100.0000 mg | ORAL_TABLET | Freq: Every day | ORAL | Status: DC
  Administered 2022-12-21 – 2022-12-24 (×4): 100 mg via ORAL
  Filled 2022-12-21 (×8): qty 1

## 2022-12-21 NOTE — Group Note (Signed)
Date:  12/21/2022 Time:  10:40 AM  Group Topic/Focus:  Orientation:   The focus of this group is to educate the patient on the purpose and policies of crisis stabilization and provide a format to answer questions about their admission.  The group details unit policies and expectations of patients while admitted.    Participation Level:  Active  Participation Quality:  Appropriate  Affect:  Appropriate  Cognitive:  Appropriate  Insight: Appropriate  Engagement in Group:  Engaged  Modes of Intervention:  Discussion  Additional Comments:     Reymundo Poll 12/21/2022, 10:40 AM

## 2022-12-21 NOTE — [facility] Group Notes (Signed)
Adult Psychoeducational Group Note  Date:  12/21/2022 Time:  9:59 PM  Group Topic/Focus:  Wrap-Up Group:   The focus of this group is to help patients review their daily goal of treatment and discuss progress on daily workbooks.  Participation Level:  Active  Participation Quality:  Appropriate  Affect:  Appropriate  Cognitive:  Appropriate  Insight: Appropriate  Engagement in Group:  Engaged  Modes of Intervention:  Education  Additional Comments:  Pt goal today is to work on her social skills. Tomorrow pt wants to work on the same thing.  Shea Swalley, Sharen Counter 12/21/2022, 9:59 PM

## 2022-12-21 NOTE — Progress Notes (Signed)
[facility] MD Progress Note  12/21/2022 2:26 PM Joshua Parrish  MRN:  [id number]  Reason for admission: This is the second psychiatric admission/evaluations in this [facility] in one month for this 47 year old male with prior hx of major depressive disorder. He was discharged from this [facility] on 11-16-22 after receiving treatments worsening depression after an overdose attempt using 38 tablets of hydroxyzine & rum. He claimed at the time that he was trying to fall asleep. He is being re-admitted to the [facility] with similar complaint of suicide attempt by overdose on Ibuprofen & blood pressure medications after being evicted from his apartment.  After medical evaluation/stabilization at the [facility], Joshua Parrish was brought to the [facility] for further psychiatric evaluation/treatments.     Daily notes: Joshua Parrish is seen, chart reviewed. The chart findings discussed with the treatment team. He presents alert, oriented & aware of situation. He is visible on the unit, chatting with other patients. He presents with a good affect, good eye contact & verbally responsive. He reports, "I feel good now that I'm back on my medicines. I'm now joking & making people laugh again. I have also learned something new that I did not learn when I was here last time. I know that I can go to behavioral health urgent care when I run out of my medicines & I will get it refilled. My sleep last night was so so. The manager of my apartment complex is the one taking me to court for the eviction notice. I don't think it will be against me if I miss the court date on 12-24-22. I don't think that I will be ready for discharge by then any way because I tried to kill myself. The judge that signed my IVC papers can attest that I needed to be in the hospital & not in the court room. Besides, I have people on the outside working on this case. These people are not lawyers, but they know the system. I also have some people trying to find an agency that will help pay my rent  that I'm owing. The last time I was hospitalized here messed me up because I was not able to go to work, so I did not make much money".  Joshua Parrish currently denies any SIHI, AVH, delusional thoughts or paranoia. He does not appear to be responding to any internal stimuli. Patient's Trazodone has been increased from 50 mg to 100 mg routinely at bedtime. Will continue current plan of care as already in progress,  Principal Problem: Major depressive disorder, recurrent episode, severe  Diagnosis: Principal Problem:   Major depressive disorder, recurrent episode, severe Active Problems:   Suicidal ideations  Total Time spent with patient:  35 minutes  Past Psychiatric History: See H&P  Past Medical History:  Past Medical History:  Diagnosis Date   Anxiety    Chicken pox    Depression    Fatty liver    GERD (gastroesophageal reflux disease)    H/O hernia repair 03/31/2022   Hypertension    Migraines    Osteoarthritis of shoulder    left   Rotator cuff impingement syndrome of left shoulder 06/19/2015   Sinusitis    Sleep apnea     Past Surgical History:  Procedure Laterality Date   APPENDECTOMY     CERVICAL LAMINECTOMY     has plates and fusion   CHOLECYSTECTOMY N/A 12/24/2015   Procedure: LAPAROSCOPIC CHOLECYSTECTOMY ;  Surgeon: Gaynelle Adu, MD;  Location: [facility];  Service: General;  Laterality: N/A;   CLAVICLE EXCISION Left    HYPOSPADIAS CORRECTION     as child   KNEE SURGERY Bilateral    multiple   SHOULDER ARTHROSCOPY WITH ROTATOR CUFF REPAIR Left 06/26/2015   Procedure: SHOULDER ARTHROSCOPY WITH ROTATOR CUFF REPAIR;  Surgeon: Salvatore Marvel, MD;  Location: [facility];  Service: Orthopedics;  Laterality: Left;   Family History:  Family History  Problem Relation Age of Onset   Arthritis Mother    Ovarian cancer Mother    Alcohol abuse Father    Breast cancer Maternal Uncle    Breast cancer Maternal Grandmother    Cancer Maternal Grandmother        spinal  surgery   Cancer Other    Hypertension Other    Diabetes Other    Breast cancer Maternal Great-grandfather    Colon cancer Neg Hx    Sleep apnea Neg Hx    Family Psychiatric  History: See H&P.  Social History:  Social History   Substance and Sexual Activity  Alcohol Use Yes   Alcohol/week: 0.0 standard drinks of alcohol   Comment: occasional     Social History   Substance and Sexual Activity  Drug Use No    Social History   Socioeconomic History   Marital status: Divorced    Spouse name: Not on file   Number of children: 1   Years of education: 12   Highest education level: Not on file  Occupational History   Occupation: Garage Administrator, sports   Tobacco Use   Smoking status: Never   Smokeless tobacco: Current    Types: Chew   Tobacco comments:    form given 11/29/15  Vaping Use   Vaping Use: Never used  Substance and Sexual Activity   Alcohol use: Yes    Alcohol/week: 0.0 standard drinks of alcohol    Comment: occasional   Drug use: No   Sexual activity: Yes  Other Topics Concern   Not on file  Social History Narrative   Fun: Lift weights, hike   Social Determinants of Health   Financial Resource Strain: Not on file  Food Insecurity: No Food Insecurity (12/19/2022)   Hunger Vital Sign    Worried About Running Out of Food in the Last Year: Never true    Ran Out of Food in the Last Year: Never true  Transportation Needs: No Transportation Needs (12/19/2022)   PRAPARE - Administrator, Civil Service (Medical): No    Lack of Transportation (Non-Medical): No  Physical Activity: Not on file  Stress: Not on file  Social Connections: Not on file   Additional Social History:   Sleep: Fair  Appetite:  Good  Current Medications: Current Facility-Administered Medications  Medication Dose Route Frequency Provider Last Rate Last Admin   acetaminophen (TYLENOL) tablet 650 mg  650 mg Oral Q6H PRN Sindy Guadeloupe, NP       alum & mag hydroxide-simeth  (MAALOX/MYLANTA) 200-200-20 MG/5ML suspension 30 mL  30 mL Oral Q4H PRN Sindy Guadeloupe, NP       amLODipine (NORVASC) tablet 10 mg  10 mg Oral Daily Armandina Stammer I, NP   10 mg at 12/21/22 0736   gabapentin (NEURONTIN) capsule 300 mg  300 mg Oral TID Armandina Stammer I, NP   300 mg at 12/21/22 1202   hydrochlorothiazide (HYDRODIURIL) tablet 25 mg  25 mg Oral Daily Massengill, Harrold Donath, MD   25 mg at 12/21/22 0736   OLANZapine  zydis (ZYPREXA) disintegrating tablet 10 mg  10 mg Oral Q8H PRN Sindy Guadeloupe, NP       And   LORazepam (ATIVAN) tablet 1 mg  1 mg Oral PRN Sindy Guadeloupe, NP       And   ziprasidone (GEODON) injection 20 mg  20 mg Intramuscular PRN Sindy Guadeloupe, NP       losartan (COZAAR) tablet 100 mg  100 mg Oral Daily Massengill, Nathan, MD   100 mg at 12/21/22 0736   magnesium hydroxide (MILK OF MAGNESIA) suspension 30 mL  30 mL Oral Daily PRN Sindy Guadeloupe, NP       pantoprazole (PROTONIX) EC tablet 40 mg  40 mg Oral Daily Joslynn Jamroz, Nicole Kindred I, NP   40 mg at 12/21/22 0736   [START ON 12/25/2022] testosterone cypionate (DEPOTESTOSTERONE CYPIONATE) injection 100 mg  100 mg Intramuscular Q Thu Brittany Amirault I, NP       traZODone (DESYREL) tablet 50 mg  50 mg Oral QHS PRN Ajibola, Ene A, NP   50 mg at 12/20/22 2102   venlafaxine XR (EFFEXOR-XR) 24 hr capsule 75 mg  75 mg Oral Q breakfast Armandina Stammer I, NP   75 mg at 12/21/22 1914   Lab Results: No results found for this or any previous visit (from the past 48 hour(s)).  Blood Alcohol level:  Lab Results  Component Value Date   ETH 28 (H) 12/17/2022   ETH 23 (H) 11/11/2022   Metabolic Disorder Labs: Lab Results  Component Value Date   HGBA1C 5.4 10/14/2022   MPG 111 09/20/2021   MPG 111 08/21/2009   No results found for: "PROLACTIN" Lab Results  Component Value Date   CHOL 229 (H) 04/19/2021   TRIG 359.0 (H) 04/19/2021   HDL 34.30 (L) 04/19/2021   CHOLHDL 7 04/19/2021   VLDL 71.8 (H) 04/19/2021   Physical Findings: AIMS:  , ,  ,  ,     CIWA:    COWS:     Musculoskeletal: Strength & Muscle Tone: within normal limits Gait & Station: normal Patient leans: N/A  Psychiatric Specialty Exam:  Presentation  General Appearance:  Casual; Fairly Groomed  Eye Contact: Good  Speech: Clear and Coherent; Normal Rate  Speech Volume: Normal  Handedness: Right  Mood and Affect  Mood: Depressed; Anxious  Affect: Congruent  Thought Process  Thought Processes: Coherent; Goal Directed; Linear  Descriptions of Associations:Intact  Orientation:Full (Time, Place and Person)  Thought Content:Logical  History of Schizophrenia/Schizoaffective disorder:No data recorded Duration of Psychotic Symptoms:No data recorded Hallucinations:Hallucinations: None  Ideas of Reference:None  Suicidal Thoughts:Suicidal Thoughts: No  Homicidal Thoughts:Homicidal Thoughts: No  Sensorium  Memory: Immediate Good; Recent Good; Remote Good  Judgment: Fair  Insight: Fair  Art therapist  Concentration: Good  Attention Span: Good  Recall: Good  Fund of Knowledge: Fair  Language: Good  Psychomotor Activity  Psychomotor Activity: Psychomotor Activity: Normal  Assets  Assets: Communication Skills; Desire for Improvement; Physical Health; Resilience  Sleep  Sleep: Sleep: Good Number of Hours of Sleep: 7.5  Physical Exam: Physical Exam Vitals and nursing note reviewed.  HENT:     Head: Normocephalic.     Nose: Nose normal.     Mouth/Throat:     Pharynx: Oropharynx is clear.  Eyes:     Pupils: Pupils are equal, round, and reactive to light.  Cardiovascular:     Rate and Rhythm: Normal rate.     Pulses: Normal pulses.  Pulmonary:     Effort:  Pulmonary effort is normal.  Genitourinary:    Comments: Deferred Musculoskeletal:        General: Normal range of motion.     Cervical back: Normal range of motion.  Skin:    General: Skin is warm and dry.  Neurological:     General: No focal  deficit present.     Mental Status: He is alert and oriented to person, place, and time.    Review of Systems  Constitutional:  Negative for chills, diaphoresis and fever.  HENT:  Negative for congestion and sore throat.   Eyes:  Negative for blurred vision.  Respiratory:  Negative for cough, shortness of breath and wheezing.   Cardiovascular:  Negative for chest pain and palpitations.  Gastrointestinal:  Negative for abdominal pain, constipation, diarrhea, heartburn, nausea and vomiting.  Genitourinary:  Negative for dysuria.  Musculoskeletal:  Negative for joint pain and myalgias.  Skin:  Negative for itching and rash.  Neurological:  Negative for dizziness, tingling, tremors, sensory change, speech change, focal weakness, seizures, loss of consciousness, weakness and headaches.  Endo/Heme/Allergies:        See allergy lists.  Psychiatric/Behavioral:  Positive for depression and substance abuse (Hx alcohol abuse). Negative for hallucinations, memory loss and suicidal ideas. The patient has insomnia. The patient is not nervous/anxious.    Blood pressure 115/80, pulse 88, temperature (!) 97.5 F (36.4 C), temperature source Oral, resp. rate (!) 24, height  (1.727 m), weight 126.7 kg, SpO2 99 %. Body mass index is 42.48 kg/m.  Treatment Plan Summary: Daily contact with patient to assess and evaluate symptoms and progress in treatment and Medication management.   Continue inpatient hospitalization.  Will continue today 12/21/2022 plan as below except where it is noted.   Principal/active diagnoses. Major depressive disorder, recurrent episode, severe (HCC).  Alcohol use disorder, mild.   Associated symptoms. Anxiety issues.  Insomnia.  Impulsivity.  Plan. -Continue Effexor XR 75 mg po daily for depression.  -Continue gabapentin 300 mg po tid for anxiety.   Agitation protocols: Cont as recommended;  -Olanzapine Zydis 10 mg Q 8 hrs prn. -Geodon 20 mg IM prn x 1 dose.   -Lorazepam 1 mg po IM prn X 1 dose.   Other medical issues.  -Continue Amlodipine 10 mg po daily for HTN.  -Continue Testosterone 100 mg IM Q Thursdays.  -Continue losartan-HCTZ 100-25 mg po daily for HTN.  -Continue Protonix 40 mg po Q am for GERD.  -Continue Albuterol inhaler 2 puff Q 4 hrs prn for SOB.   Other PRNS -Continue Tylenol 650 mg every 6 hours PRN for mild pain -Continue Maalox 30 ml Q 4 hrs PRN for indigestion -Continue MOM 30 ml po Q 6 hrs for constipation   Safety and Monitoring: Voluntary admission to inpatient psychiatric unit for safety, stabilization and treatment Daily contact with patient to assess and evaluate symptoms and progress in treatment Patient's case to be discussed in multi-disciplinary team meeting Observation Level : q15 minute checks Vital signs: q12 hours Precautions: Safety   Discharge Planning: Social work and case management to assist with discharge planning and identification of hospital follow-up needs prior to discharge Estimated LOS: 5-7 days Discharge Concerns: Need to establish a safety plan; Medication compliance and effectiveness Discharge Goals: Return home with outpatient referrals for mental health follow-up including medication management/psychotherapy  Armandina Stammer, NP, pmhnp, fnp-bc 12/21/2022, 2:26 PMPatient ID: Joshua Parrish, male   DOB: 13-Sep-1975, 47 y.o.   MRN: [id number]

## 2022-12-21 NOTE — Group Note (Signed)
Date:  12/21/2022 Time:  10:14 AM  Group Topic/Focus:  Goals Group:   The focus of this group is to help patients establish daily goals to achieve during treatment and discuss how the patient can incorporate goal setting into their daily lives to aide in recovery.    Participation Level:  Minimal  Participation Quality:  Supportive  Affect:  Appropriate  Cognitive:  Appropriate  Insight: Appropriate  Engagement in Group:  Engaged  Modes of Intervention:  Exploration  Additional Comments:     Reymundo Poll 12/21/2022, 10:14 AM

## 2022-12-21 NOTE — Progress Notes (Signed)
D: Patient is alert, oriented, pleasant, and cooperative. Denies SI, HI, AVH, and verbally contracts for safety. Patient reports he slept fair last night with sleeping medication. Patient reports his appetite as good, energy level as normal, and concentration as good. Patient rates his depression 0/10, hopelessness 0/10, and anxiety 3/10. Patient denies physical symptoms/pain.    A: Scheduled medications administered per MD order. Support provided. Patient educated on safety on the unit and medications. Routine safety checks every 15 minutes. Patient stated understanding to tell nurse about any new physical symptoms. Patient understands to tell staff of any needs.     R: No adverse drug reactions noted. Patient verbally contracts for safety. Patient remains safe at this time and will continue to monitor.    12/21/22 0900  Psych Admission Type (Psych Patients Only)  Admission Status Involuntary  Psychosocial Assessment  Patient Complaints Anxiety  Eye Contact Fair  Facial Expression Anxious  Affect Appropriate to circumstance  Speech Logical/coherent  Interaction Assertive  Motor Activity Other (Comment) (WNL)  Appearance/Hygiene Unremarkable  Behavior Characteristics Appropriate to situation  Mood Anxious;Pleasant  Thought Process  Coherency WDL  Content WDL  Delusions None reported or observed  Perception WDL  Hallucination None reported or observed  Judgment Impaired  Confusion None  Danger to Self  Current suicidal ideation? Denies  Agreement Not to Harm Self Yes  Description of Agreement verbal  Danger to Others  Danger to Others None reported or observed

## 2022-12-21 NOTE — Progress Notes (Signed)
   12/21/22 2207  Psych Admission Type (Psych Patients Only)  Admission Status Involuntary  Psychosocial Assessment  Patient Complaints Insomnia;Sleep disturbance  Eye Contact Fair  Facial Expression Animated  Affect Appropriate to circumstance  Speech Logical/coherent  Interaction Assertive  Motor Activity Other (Comment) (WDL)  Appearance/Hygiene Unremarkable  Behavior Characteristics Appropriate to situation  Mood Pleasant  Thought Process  Coherency WDL  Content WDL  Delusions None reported or observed  Perception WDL  Hallucination None reported or observed  Judgment Impaired  Confusion None  Danger to Self  Current suicidal ideation? Denies  Agreement Not to Harm Self Yes  Description of Agreement verbal  Danger to Others  Danger to Others None reported or observed

## 2022-12-22 ENCOUNTER — Encounter (HOSPITAL_COMMUNITY): Payer: Self-pay

## 2022-12-22 DIAGNOSIS — F332 Major depressive disorder, recurrent severe without psychotic features: Secondary | ICD-10-CM | POA: Diagnosis not present

## 2022-12-22 NOTE — [facility] Group Notes (Signed)
Pt did not attend AA group  

## 2022-12-22 NOTE — Plan of Care (Signed)
  Problem: Safety: Goal: Periods of time without injury will increase Outcome: Progressing   

## 2022-12-22 NOTE — Group Note (Signed)
Date:  12/22/2022 Time:  9:59 AM  Group Topic/Focus:  Orientation:   The focus of this group is to educate the patient on the purpose and policies of crisis stabilization and provide a format to answer questions about their admission.  The group details unit policies and expectations of patients while admitted.    Participation Level:  Active  Participation Quality:  Appropriate  Affect:  Appropriate  Cognitive:  Appropriate  Insight: Appropriate  Engagement in Group:  Engaged  Modes of Intervention:  Discussion  Additional Comments:     Reymundo Poll 12/22/2022, 9:59 AM

## 2022-12-22 NOTE — Progress Notes (Signed)
   12/22/22 1000  Psych Admission Type (Psych Patients Only)  Admission Status Involuntary  Psychosocial Assessment  Patient Complaints Anxiety  Eye Contact Fair  Facial Expression Animated  Affect Appropriate to circumstance  Speech Logical/coherent  Interaction Assertive  Motor Activity Other (Comment) (WNL)  Appearance/Hygiene Unremarkable  Behavior Characteristics Appropriate to situation  Mood Pleasant  Thought Process  Coherency WDL  Content WDL  Delusions None reported or observed  Perception WDL  Hallucination None reported or observed  Judgment Impaired  Confusion None  Danger to Self  Current suicidal ideation? Denies  Agreement Not to Harm Self Yes  Description of Agreement verbal  Danger to Others  Danger to Others None reported or observed

## 2022-12-22 NOTE — Group Note (Signed)
Date:  12/22/2022 Time:  10:01 AM  Group Topic/Focus:  Dimensions of Wellness:   The focus of this group is to introduce the topic of wellness and discuss the role each dimension of wellness plays in total health.    Participation Level:  Active  Participation Quality:  Appropriate  Affect:  Appropriate  Cognitive:  Appropriate  Insight: Appropriate  Engagement in Group:  Engaged  Modes of Intervention:  Exploration  Additional Comments:     Reymundo Poll 12/22/2022, 10:01 AM

## 2022-12-22 NOTE — Group Note (Signed)
Recreation Therapy Group Note   Group Topic:Team Building  Group Date: 12/22/2022 Start Time: 1610 End Time: 1016 Facilitators: Priscille Shadduck-McCall, LRT,CTRS Location: [address] Day[address]   Goal Area(s) Addresses:  Patient will effectively work with peer towards shared goal.  Patient will identify skills used to make activity successful.  Patient will identify how skills used during activity can be used to reach post d/c goals.    Group Description: Landing Pad. In teams of 3-5, patients were given 12 plastic drinking straws and an equal length of masking tape. Using the materials provided, patients were asked to build a landing pad to catch a golf ball dropped from approximately 5 feet in the air. All materials were required to be used by the team in their design. LRT facilitated post-activity discussion.   Affect/Mood: Appropriate   Participation Level: Engaged   Participation Quality: Independent   Behavior: Appropriate   Speech/Thought Process: Focused   Insight: Good   Judgement: Good   Modes of Intervention: STEM Activity   Patient Response to Interventions:  Engaged   Education Outcome:  In group clarification offered    Clinical Observations/Individualized Feedback: Pt was bright and engaged with peers.  Pt was also competitive and engaged in a little trash talk with peers.  Pt was appropriate throughout group session.       Plan: Continue to engage patient in RT group sessions 2-3x/week.   Marieanne Marxen-McCall, LRT,CTRS 12/22/2022 12:50 PM

## 2022-12-22 NOTE — Group Note (Signed)
Occupational Therapy Group Note  Group Topic: Sleep Hygiene  Group Date: 12/22/2022 Start Time: 1430 End Time: 1500 Facilitators: Nickie Warwick G, OT   Group Description: Group encouraged increased participation and engagement through topic focused on sleep hygiene. Patients reflected on the quality of sleep they typically receive and identified areas that need improvement. Group was given background information on sleep and sleep hygiene, including common sleep disorders. Group members also received information on how to improve one's sleep and introduced a sleep diary as a tool that can be utilized to track sleep quality over a length of time. Group session ended with patients identifying one or more strategies they could utilize or implement into their sleep routine in order to improve overall sleep quality.        Therapeutic Goal(s):  Identify one or more strategies to improve overall sleep hygiene  Identify one or more areas of sleep that are negatively impacted (sleep too much, too little, etc)     Participation Level: Engaged   Participation Quality: Independent   Behavior: Appropriate   Speech/Thought Process: Relevant   Affect/Mood: Appropriate   Insight: Fair   Judgement: Fair      Modes of Intervention: Education  Patient Response to Interventions:  Attentive   Plan: Continue to engage patient in OT groups 2 - 3x/week.  12/22/2022  Baylynn Shifflett G Mersedes Alber, OT   Josue Falconi, OT  

## 2022-12-22 NOTE — BH IP Treatment Plan (Signed)
Interdisciplinary Treatment and Diagnostic Plan Update  12/22/2022 Time of Session: 11:15am Joshua Parrish MRN: [id number]  Principal Diagnosis: Major depressive disorder, recurrent episode, severe  Secondary Diagnoses: Principal Problem:   Major depressive disorder, recurrent episode, severe Active Problems:   Suicidal ideations   Current Medications:  Current Facility-Administered Medications  Medication Dose Route Frequency Provider Last Rate Last Admin   acetaminophen (TYLENOL) tablet 650 mg  650 mg Oral Q6H PRN Sindy Guadeloupe, NP       alum & mag hydroxide-simeth (MAALOX/MYLANTA) 200-200-20 MG/5ML suspension 30 mL  30 mL Oral Q4H PRN Sindy Guadeloupe, NP       amLODipine (NORVASC) tablet 10 mg  10 mg Oral Daily Armandina Stammer I, NP   10 mg at 12/22/22 4098   gabapentin (NEURONTIN) capsule 300 mg  300 mg Oral TID Armandina Stammer I, NP   300 mg at 12/22/22 1138   hydrochlorothiazide (HYDRODIURIL) tablet 25 mg  25 mg Oral Daily Massengill, Harrold Donath, MD   25 mg at 12/22/22 0903   OLANZapine zydis (ZYPREXA) disintegrating tablet 10 mg  10 mg Oral Q8H PRN Sindy Guadeloupe, NP       And   LORazepam (ATIVAN) tablet 1 mg  1 mg Oral PRN Sindy Guadeloupe, NP       And   ziprasidone (GEODON) injection 20 mg  20 mg Intramuscular PRN Sindy Guadeloupe, NP       losartan (COZAAR) tablet 100 mg  100 mg Oral Daily Massengill, Harrold Donath, MD   100 mg at 12/22/22 1191   magnesium hydroxide (MILK OF MAGNESIA) suspension 30 mL  30 mL Oral Daily PRN Sindy Guadeloupe, NP       pantoprazole (PROTONIX) EC tablet 40 mg  40 mg Oral Daily Armandina Stammer I, NP   40 mg at 12/22/22 0903   [START ON 12/25/2022] testosterone cypionate (DEPOTESTOSTERONE CYPIONATE) injection 100 mg  100 mg Intramuscular Q Thu Nwoko, Nicole Kindred I, NP       traZODone (DESYREL) tablet 100 mg  100 mg Oral QHS Armandina Stammer I, NP   100 mg at 12/21/22 2116   venlafaxine XR (EFFEXOR-XR) 24 hr capsule 75 mg  75 mg Oral Q breakfast Armandina Stammer I, NP   75 mg at 12/22/22 4782    PTA Medications: Medications Prior to Admission  Medication Sig Dispense Refill Last Dose   amLODipine (NORVASC) 10 MG tablet TAKE 1 TABLET BY MOUTH DAILY 30 tablet 3    anastrozole (ARIMIDEX) 1 MG tablet Take 1 mg by mouth every Monday, Wednesday, and Friday.      gabapentin (NEURONTIN) 300 MG capsule Take 1 capsule (300 mg total) by mouth 3 (three) times daily. 90 capsule 0    omeprazole (PRILOSEC) 40 MG capsule TAKE ONE CAPSULE BY MOUTH DAILY 90 capsule 3    pantoprazole (PROTONIX) 40 MG tablet Take 1 tablet (40 mg total) by mouth daily after lunch. 30 tablet 0    testosterone cypionate (DEPOTESTOSTERONE CYPIONATE) 200 MG/ML injection Inject 100 mg into the muscle every Thursday.      valsartan-hydrochlorothiazide (DIOVAN-HCT) 320-25 MG tablet TAKE 1 TABLET BY MOUTH DAILY 30 tablet 3    venlafaxine XR (EFFEXOR-XR) 75 MG 24 hr capsule Take 1 capsule (75 mg total) by mouth daily with breakfast. 30 capsule 0     Patient Stressors: Financial difficulties   Legal issue   Marital or family conflict   Medication change or noncompliance    Patient Strengths: Capable of independent living  Motivation for treatment/growth  Special hobby/interest  Work skills   Treatment Modalities: Medication Management, Group therapy, Case management,  1 to 1 session with clinician, Psychoeducation, Recreational therapy.   Physician Treatment Plan for Primary Diagnosis: Major depressive disorder, recurrent episode, severe Long Term Goal(s): Improvement in symptoms so as ready for discharge   Short Term Goals: Ability to identify and develop effective coping behaviors will improve Ability to maintain clinical measurements within normal limits will improve Compliance with prescribed medications will improve Ability to identify triggers associated with substance abuse/mental health issues will improve Ability to identify changes in lifestyle to reduce recurrence of condition will improve Ability to  verbalize feelings will improve Ability to disclose and discuss suicidal ideas Ability to demonstrate self-control will improve  Medication Management: Evaluate patient's response, side effects, and tolerance of medication regimen.  Therapeutic Interventions: 1 to 1 sessions, Unit Group sessions and Medication administration.  Evaluation of Outcomes: Progressing  Physician Treatment Plan for Secondary Diagnosis: Principal Problem:   Major depressive disorder, recurrent episode, severe Active Problems:   Suicidal ideations  Long Term Goal(s): Improvement in symptoms so as ready for discharge   Short Term Goals: Ability to identify and develop effective coping behaviors will improve Ability to maintain clinical measurements within normal limits will improve Compliance with prescribed medications will improve Ability to identify triggers associated with substance abuse/mental health issues will improve Ability to identify changes in lifestyle to reduce recurrence of condition will improve Ability to verbalize feelings will improve Ability to disclose and discuss suicidal ideas Ability to demonstrate self-control will improve     Medication Management: Evaluate patient's response, side effects, and tolerance of medication regimen.  Therapeutic Interventions: 1 to 1 sessions, Unit Group sessions and Medication administration.  Evaluation of Outcomes: Progressing   RN Treatment Plan for Primary Diagnosis: Major depressive disorder, recurrent episode, severe Long Term Goal(s): Knowledge of disease and therapeutic regimen to maintain health will improve  Short Term Goals: Ability to remain free from injury will improve, Ability to verbalize frustration and anger appropriately will improve, Ability to demonstrate self-control, Ability to participate in decision making will improve, Ability to verbalize feelings will improve, Ability to disclose and discuss suicidal ideas, Ability to  identify and develop effective coping behaviors will improve, and Compliance with prescribed medications will improve  Medication Management: RN will administer medications as ordered by provider, will assess and evaluate patient's response and provide education to patient for prescribed medication. RN will report any adverse and/or side effects to prescribing provider.  Therapeutic Interventions: 1 on 1 counseling sessions, Psychoeducation, Medication administration, Evaluate responses to treatment, Monitor vital signs and CBGs as ordered, Perform/monitor CIWA, COWS, AIMS and Fall Risk screenings as ordered, Perform wound care treatments as ordered.  Evaluation of Outcomes: Progressing   LCSW Treatment Plan for Primary Diagnosis: Major depressive disorder, recurrent episode, severe Long Term Goal(s): Safe transition to appropriate next level of care at discharge, Engage patient in therapeutic group addressing interpersonal concerns.  Short Term Goals: Engage patient in aftercare planning with referrals and resources, Increase social support, Increase ability to appropriately verbalize feelings, Increase emotional regulation, Facilitate acceptance of mental health diagnosis and concerns, Facilitate patient progression through stages of change regarding substance use diagnoses and concerns, Identify triggers associated with mental health/substance abuse issues, and Increase skills for wellness and recovery  Therapeutic Interventions: Assess for all discharge needs, 1 to 1 time with Social worker, Explore available resources and support systems, Assess for adequacy in community support network, Educate family and significant  other(s) on suicide prevention, Complete Psychosocial Assessment, Interpersonal group therapy.  Evaluation of Outcomes: Progressing   Progress in Treatment: Attending groups: Yes. Participating in groups: Yes. Taking medication as prescribed: Yes. Toleration medication:  Yes. Family/Significant other contact made: No, will contact:  friend Scarlette Shorts,  Patient understands diagnosis: Yes. Discussing patient identified problems/goals with staff: Yes. Medical problems stabilized or resolved: Yes. Denies suicidal/homicidal ideation: Yes. Issues/concerns per patient self-inventory: No.   New problem(s) identified: No, Describe:  none reported   New Short Term/Long Term Goal(s):  medication stabilization, elimination of SI thoughts, development of comprehensive mental wellness plan.    Patient Goals:  Pt states, "I need to get back on my meds and figure out how to keep getting my meds when I get out of here. "  Discharge Plan or Barriers: Patient recently admitted. CSW will continue to follow and assess for appropriate referrals and possible discharge planning.    Reason for Continuation of Hospitalization: Anxiety Depression Medication stabilization Suicidal ideation  Estimated Length of Stay: 3-5 days  Last 3 [place] Suicide Severity Risk Score: Flowsheet Row [facility] (Discharged) [facility] from 11/11/2022 in [facility]  C-SSRS RISK CATEGORY Error: Q3, 4, or 5 should not be populated when Q2 is No No Risk High Risk       Last PHQ 2/9 Scores:    10/14/2022    8:30 AM 08/12/2022    9:15 AM 02/25/2022   11:17 AM  Depression screen PHQ 2/9  Decreased Interest 0 2 0  Down, Depressed, Hopeless 0 2 0  PHQ - 2 Score 0 4 0  Altered sleeping  2   Tired, decreased energy  0   Change in appetite  2   Feeling bad or failure about yourself   2   Trouble concentrating  2   Moving slowly or fidgety/restless  0   Suicidal thoughts  2   PHQ-9 Score  14   Difficult doing work/chores  Somewhat difficult     Scribe for Treatment Team: Beatris Si,  LCSW 12/22/2022 3:40 PM

## 2022-12-22 NOTE — Progress Notes (Signed)
[facility] Behavioral MD Progress Note  12/22/2022 4:13 PM Joshua Parrish  MRN:  [id number]  Reason for admission: This is the second psychiatric admission/evaluations in this [facility] in one month for this 47 year old male with prior hx of major depressive disorder. He was discharged from this [facility] on 11-16-22 after receiving treatments worsening depression after an overdose attempt using 38 tablets of hydroxyzine & rum. He claimed at the time that he was trying to fall asleep. He is being re-admitted to the [facility] with similar complaint of suicide attempt by overdose on Ibuprofen & blood pressure medications after being evicted from his apartment.  After medical evaluation/stabilization at the [facility], Joshua Parrish was brought to the [facility] for further psychiatric evaluation/treatments.     24-hour chart Review: Past 24 hours of patient's chart was reviewed.  Patient is compliant with scheduled meds, except for scheduled testing is to run injection. As needed medication: None required Required Agitation PRNs: none Per RN notes, no documented behavioral issues and is attending group. Patient slept, 5 hours.    Daily notes: Joshua Parrish is seen and examined in the office sitting up in a chair.  Chart reviewed, and findings shared with the treatment team and discussed with the attending psychiatrist.  He presents alert, oriented & aware of situation. He is visible on the unit, and participating in therapeutic milieu and group activities. He presents with a good affect, good eye contact & verbally responsive. He reports, "I feel good now that I'm back on my medicines. I'm now joking & making people laugh again. I have also learned something new that I did not learn when I was here last time. I know that I can go to behavioral health urgent care when I run out of my medicines & I will get it refilled.  Reports sleeping over 5 hours last night due to being worried about reporting to court for his eviction notice coming up on 24 December 2022.   Patient added, I am not sure if I will be able to attend the court system as I am already in the hospital.  Instruct patient to work with the Child psychotherapist and this issue.  He continues on Effexor XR 75 mg po daily for depression, gabapentin 300 mg po tid for anxiety, and trazodone 100 mg p.o. q. nightly for insomnia.  Tolerating scheduled medication well without somatic discomfort.  Patient currently denies any SIHI, AVH, delusional thoughts or paranoia. He does not appear to be responding to any internal stimuli.   Will continue current treatment plan as already in progress.  Vital signs within normal limit with blood pressure of 140/83, and pulse of 77.  Principal Problem: Major depressive disorder, recurrent episode, severe  Diagnosis: Principal Problem:   Major depressive disorder, recurrent episode, severe Active Problems:   Suicidal ideations  Total Time spent with patient:  35 minutes  Past Psychiatric History: See H&P  Past Medical History:  Past Medical History:  Diagnosis Date   Anxiety    Chicken pox    Depression    Fatty liver    GERD (gastroesophageal reflux disease)    H/O hernia repair 03/31/2022   Hypertension    Migraines    Osteoarthritis of shoulder    left   Rotator cuff impingement syndrome of left shoulder 06/19/2015   Sinusitis    Sleep apnea     Past Surgical History:  Procedure Laterality Date   APPENDECTOMY     CERVICAL LAMINECTOMY  has plates and fusion   CHOLECYSTECTOMY N/A 12/24/2015   Procedure: LAPAROSCOPIC CHOLECYSTECTOMY ;  Surgeon: Gaynelle Adu, MD;  Location: [facility];  Service: General;  Laterality: N/A;   CLAVICLE EXCISION Left    HYPOSPADIAS CORRECTION     as child   KNEE SURGERY Bilateral    multiple   SHOULDER ARTHROSCOPY WITH ROTATOR CUFF REPAIR Left 06/26/2015   Procedure: SHOULDER ARTHROSCOPY WITH ROTATOR CUFF REPAIR;  Surgeon: Salvatore Marvel, MD;  Location: [facility];  Service: Orthopedics;  Laterality: Left;    Family History:  Family History  Problem Relation Age of Onset   Arthritis Mother    Ovarian cancer Mother    Alcohol abuse Father    Breast cancer Maternal Uncle    Breast cancer Maternal Grandmother    Cancer Maternal Grandmother        spinal surgery   Cancer Other    Hypertension Other    Diabetes Other    Breast cancer Maternal Great-grandfather    Colon cancer Neg Hx    Sleep apnea Neg Hx    Family Psychiatric  History: See H&P.  Social History:  Social History   Substance and Sexual Activity  Alcohol Use Yes   Alcohol/week: 0.0 standard drinks of alcohol   Comment: occasional     Social History   Substance and Sexual Activity  Drug Use No    Social History   Socioeconomic History   Marital status: Divorced    Spouse name: Not on file   Number of children: 1   Years of education: 12   Highest education level: Not on file  Occupational History   Occupation: Garage Administrator, sports   Tobacco Use   Smoking status: Never   Smokeless tobacco: Current    Types: Chew   Tobacco comments:    form given 11/29/15  Vaping Use   Vaping Use: Never used  Substance and Sexual Activity   Alcohol use: Yes    Alcohol/week: 0.0 standard drinks of alcohol    Comment: occasional   Drug use: No   Sexual activity: Yes  Other Topics Concern   Not on file  Social History Narrative   Fun: Lift weights, hike   Social Determinants of Health   Financial Resource Strain: Not on file  Food Insecurity: No Food Insecurity (12/19/2022)   Hunger Vital Sign    Worried About Running Out of Food in the Last Year: Never true    Ran Out of Food in the Last Year: Never true  Transportation Needs: No Transportation Needs (12/19/2022)   PRAPARE - Administrator, Civil Service (Medical): No    Lack of Transportation (Non-Medical): No  Physical Activity: Not on file  Stress: Not on file  Social Connections: Not on file   Additional Social History:   Sleep:  Fair  Appetite:  Good  Current Medications: Current Facility-Administered Medications  Medication Dose Route Frequency Provider Last Rate Last Admin   acetaminophen (TYLENOL) tablet 650 mg  650 mg Oral Q6H PRN Sindy Guadeloupe, NP       alum & mag hydroxide-simeth (MAALOX/MYLANTA) 200-200-20 MG/5ML suspension 30 mL  30 mL Oral Q4H PRN Sindy Guadeloupe, NP       amLODipine (NORVASC) tablet 10 mg  10 mg Oral Daily Armandina Stammer I, NP   10 mg at 12/22/22 0903   gabapentin (NEURONTIN) capsule 300 mg  300 mg Oral TID Armandina Stammer I, NP   300 mg at 12/22/22 1138  hydrochlorothiazide (HYDRODIURIL) tablet 25 mg  25 mg Oral Daily Massengill, Nathan, MD   25 mg at 12/22/22 0903   OLANZapine zydis (ZYPREXA) disintegrating tablet 10 mg  10 mg Oral Q8H PRN Sindy Guadeloupe, NP       And   LORazepam (ATIVAN) tablet 1 mg  1 mg Oral PRN Sindy Guadeloupe, NP       And   ziprasidone (GEODON) injection 20 mg  20 mg Intramuscular PRN Sindy Guadeloupe, NP       losartan (COZAAR) tablet 100 mg  100 mg Oral Daily Massengill, Nathan, MD   100 mg at 12/22/22 1610   magnesium hydroxide (MILK OF MAGNESIA) suspension 30 mL  30 mL Oral Daily PRN Sindy Guadeloupe, NP       pantoprazole (PROTONIX) EC tablet 40 mg  40 mg Oral Daily Armandina Stammer I, NP   40 mg at 12/22/22 0903   [START ON 12/25/2022] testosterone cypionate (DEPOTESTOSTERONE CYPIONATE) injection 100 mg  100 mg Intramuscular Q Thu Nwoko, Agnes I, NP       traZODone (DESYREL) tablet 100 mg  100 mg Oral QHS Nwoko, Nicole Kindred I, NP   100 mg at 12/21/22 2116   venlafaxine XR (EFFEXOR-XR) 24 hr capsule 75 mg  75 mg Oral Q breakfast Armandina Stammer I, NP   75 mg at 12/22/22 9604   Lab Results: No results found for this or any previous visit (from the past 48 hour(s)).  Blood Alcohol level:  Lab Results  Component Value Date   ETH 28 (H) 12/17/2022   ETH 23 (H) 11/11/2022   Metabolic Disorder Labs: Lab Results  Component Value Date   HGBA1C 5.4 10/14/2022   MPG 111 09/20/2021   MPG  111 08/21/2009   No results found for: "PROLACTIN" Lab Results  Component Value Date   CHOL 229 (H) 04/19/2021   TRIG 359.0 (H) 04/19/2021   HDL 34.30 (L) 04/19/2021   CHOLHDL 7 04/19/2021   VLDL 71.8 (H) 04/19/2021   Physical Findings: AIMS:  , ,  ,  ,    CIWA:    COWS:     Musculoskeletal: Strength & Muscle Tone: within normal limits Gait & Station: normal Patient leans: N/A  Psychiatric Specialty Exam:  Presentation  General Appearance:  Appropriate for Environment; Casual; Fairly Groomed  Eye Contact: Good  Speech: Clear and Coherent; Normal Rate  Speech Volume: Normal  Handedness: Right  Mood and Affect  Mood: Anxious; Depressed  Affect: Congruent  Thought Process  Thought Processes: Coherent  Descriptions of Associations:Intact  Orientation:Full (Time, Place and Person)  Thought Content:Logical  History of Schizophrenia/Schizoaffective disorder:No data recorded Duration of Psychotic Symptoms:No data recorded Hallucinations:Hallucinations: None  Ideas of Reference:None  Suicidal Thoughts:Suicidal Thoughts: No  Homicidal Thoughts:Homicidal Thoughts: No  Sensorium  Memory: Immediate Good; Recent Good  Judgment: Fair  Insight: Fair  Art therapist  Concentration: Good  Attention Span: Good  Recall: Fair  Fund of Knowledge: Fair  Language: Good  Psychomotor Activity  Psychomotor Activity: Psychomotor Activity: Normal  Assets  Assets: Communication Skills; Desire for Improvement; Physical Health; Resilience  Sleep  Sleep: Sleep: Good Number of Hours of Sleep: 5  Physical Exam: Physical Exam Vitals and nursing note reviewed.  HENT:     Head: Normocephalic.     Nose: Nose normal.     Mouth/Throat:     Pharynx: Oropharynx is clear.  Eyes:     Pupils: Pupils are equal, round, and reactive to light.  Cardiovascular:  Rate and Rhythm: Normal rate.     Pulses: Normal pulses.  Pulmonary:     Effort:  Pulmonary effort is normal.  Genitourinary:    Comments: Deferred Musculoskeletal:        General: Normal range of motion.     Cervical back: Normal range of motion.  Skin:    General: Skin is warm and dry.  Neurological:     General: No focal deficit present.     Mental Status: He is alert and oriented to person, place, and time.  Psychiatric:        Behavior: Behavior normal.    Review of Systems  Constitutional:  Negative for chills, diaphoresis and fever.  HENT:  Negative for congestion and sore throat.   Eyes:  Negative for blurred vision.  Respiratory:  Negative for cough, shortness of breath and wheezing.   Cardiovascular:  Negative for chest pain and palpitations.  Gastrointestinal:  Negative for abdominal pain, constipation, diarrhea, heartburn, nausea and vomiting.  Genitourinary:  Negative for dysuria.  Musculoskeletal:  Negative for joint pain and myalgias.  Skin:  Negative for itching and rash.  Neurological:  Negative for dizziness, tingling, tremors, sensory change, speech change, focal weakness, seizures, loss of consciousness, weakness and headaches.  Endo/Heme/Allergies:        See allergy lists.  Psychiatric/Behavioral:  Positive for depression and substance abuse (Hx alcohol abuse). Negative for hallucinations, memory loss and suicidal ideas. The patient has insomnia. The patient is not nervous/anxious.    Blood pressure (!) 140/83, pulse 77, temperature 97.8 F (36.6 C), temperature source Oral, resp. rate 20, height 5\' 8"  (1.727 m), weight 126.7 kg, SpO2 99 %. Body mass index is 42.48 kg/m.  Treatment Plan Summary: Daily contact with patient to assess and evaluate symptoms and progress in treatment and Medication management.   Continue inpatient hospitalization.  Will continue today 12/22/2022 plan as below except where it is noted.   Principal/active diagnoses. Major depressive disorder, recurrent episode, severe (HCC).  Alcohol use disorder, mild.    Associated symptoms. Anxiety issues.  Insomnia.  Impulsivity.  Plan. -Continue Effexor XR 75 mg po daily for depression.  -Continue gabapentin 300 mg po tid for anxiety.   Agitation protocols: Cont as recommended;  -Olanzapine Zydis 10 mg Q 8 hrs prn. -Geodon 20 mg IM prn x 1 dose.  -Lorazepam 1 mg po IM prn X 1 dose.   Other medical issues.  -Continue Amlodipine 10 mg po daily for HTN.  -Continue Testosterone 100 mg IM Q Thursdays.  -Continue losartan-HCTZ 100-25 mg po daily for HTN.  -Continue Protonix 40 mg po Q am for GERD.  -Continue Albuterol inhaler 2 puff Q 4 hrs prn for SOB.   Other PRNS -Continue Tylenol 650 mg every 6 hours PRN for mild pain -Continue Maalox 30 ml Q 4 hrs PRN for indigestion -Continue MOM 30 ml po Q 6 hrs for constipation   Safety and Monitoring: Voluntary admission to inpatient psychiatric unit for safety, stabilization and treatment Daily contact with patient to assess and evaluate symptoms and progress in treatment Patient's case to be discussed in multi-disciplinary team meeting Observation Level : q15 minute checks Vital signs: q12 hours Precautions: Safety   Discharge Planning: Social work and case management to assist with discharge planning and identification of hospital follow-up needs prior to discharge Estimated LOS: 5-7 days Discharge Concerns: Need to establish a safety plan; Medication compliance and effectiveness Discharge Goals: Return home with outpatient referrals for mental health follow-up including  medication management/psychotherapy  Cecilie Lowers, FNP, pmhnp, fnp-bc 12/22/2022, 4:13 PMPatient ID: Ruthell Rummage, male   DOB: November 06, 1975, 47 y.o.   MRN: [id number] Patient ID: OSMIN WELZ, male   DOB: 1976-03-10, 47 y.o.   MRN: [id number]

## 2022-12-22 NOTE — Progress Notes (Signed)
   12/22/22 2100  Psych Admission Type (Psych Patients Only)  Admission Status Involuntary  Psychosocial Assessment  Patient Complaints Anxiety  Eye Contact Fair  Facial Expression Animated  Affect Appropriate to circumstance  Speech Logical/coherent  Interaction Assertive  Motor Activity Other (Comment)  Appearance/Hygiene Unremarkable  Behavior Characteristics Appropriate to situation  Mood Pleasant  Thought Process  Coherency WDL  Content WDL  Delusions None reported or observed  Perception WDL  Hallucination None reported or observed  Judgment Limited  Confusion None  Danger to Self  Current suicidal ideation? Denies  Agreement Not to Harm Self Yes  Description of Agreement verbal (verbal)  Danger to Others  Danger to Others None reported or observed   Alert/oriented. Makes needs/concerns known to staff. Pleasant cooperative with staff. Denies SI/HI/A/V hallucinations. Med compliant. PRN med given with good effect. Patient states went to group. Will encourage continue compliance and progression towards goals. Verbally contracted for safety. Will continue to monitor.

## 2022-12-23 DIAGNOSIS — F332 Major depressive disorder, recurrent severe without psychotic features: Secondary | ICD-10-CM | POA: Diagnosis not present

## 2022-12-23 NOTE — Group Note (Signed)
Recreation Therapy Group Note   Group Topic:Animal Assisted Therapy   Group Date: 12/23/2022 Start Time: 1610 End Time: 1030 Facilitators: Traeson Dusza-McCall, LRT,CTRS Location: [address] Dayroom   Animal-Assisted Activity (AAA) Program Checklist/Progress Notes Patient Eligibility Criteria Checklist & Daily Group note for Rec Tx Intervention  AAA/T Program Assumption of Risk Form signed by Patient/ or Parent Legal Guardian Yes  Patient is free of allergies or severe asthma Yes  Patient reports no fear of animals Yes  Patient reports no history of cruelty to animals Yes  Patient understands his/her participation is voluntary Yes  Patient washes hands before animal contact Yes  Patient washes hands after animal contact Yes   Affect/Mood: Appropriate   Participation Level: Engaged   Participation Quality: Independent   Behavior: Appropriate   Speech/Thought Process: Focused    Clinical Observations/Individualized Feedback: Pt attended and participated in group session.     Plan: Continue to engage patient in RT group sessions 2-3x/week.   Yittel Emrich-McCall, LRT,CTRS 12/23/2022 1:03 PM

## 2022-12-23 NOTE — Group Note (Signed)
Date:  12/23/2022 Time:  3:42 PM  Group Topic/Focus:  Dimensions of Wellness:   The focus of this group is to introduce the topic of wellness and discuss the role each dimension of wellness plays in total health.    Participation Level:  Active  Participation Quality:  Appropriate  Affect:  Appropriate  Cognitive:  Appropriate  Insight: Appropriate  Engagement in Group:  Engaged  Modes of Intervention:  Exploration  Additional Comments:     Reymundo Poll 12/23/2022, 3:42 PM

## 2022-12-23 NOTE — Progress Notes (Signed)
Pt informed this nurse that swelling in lower legs has gone down after pt went outside for recreation and played basketball.  Pt said, "I'm not used to sitting so much, the movement helped my swelling a ton."  Pt says he wants to hold off on putting on TED hose now; says he will wear tonight at bedtime.

## 2022-12-23 NOTE — Progress Notes (Signed)
Pt c/o of swelling in lower extremities.  RN assessed pt's legs. No pitting edema is noted.  Pt's legs do feel hard like there may be fluid in them.  RN notified NP.  NP ordered TED Hose for pt.  Pt said he takes hctz at home to help counter act fluid from amlodipine.  RN notified NP.  Pt took 25 mg of hctz this morning.

## 2022-12-23 NOTE — Group Note (Signed)
Date:  12/23/2022 Time:  9:08 AM  Group Topic/Focus:  Goals Group:   The focus of this group is to help patients establish daily goals to achieve during treatment and discuss how the patient can incorporate goal setting into their daily lives to aide in recovery. Orientation:   The focus of this group is to educate the patient on the purpose and policies of crisis stabilization and provide a format to answer questions about their admission.  The group details unit policies and expectations of patients while admitted.    Participation Level:  Active  Participation Quality:  Attentive  Affect:  Appropriate  Cognitive:  Appropriate  Insight: Appropriate  Engagement in Group:  Engaged  Modes of Intervention:  Discussion  Additional Comments:  Patient attended group and was attentive the duration of it. Shanikqua Zarzycki T Hyacinth Marcelli 12/23/2022, 9:08 AM

## 2022-12-23 NOTE — [facility] Suicide Risk Assessment (Signed)
[facility] INPATIENT:  Family/Significant Other Suicide Prevention Education  Suicide Prevention Education:  Education Completed; 12-23-2022, Alinda Money  [phone] (Friend) has been identified by the patient as the family member/significant other with whom the patient will be residing, and identified as the person(s) who will aid the patient in the event of a mental health crisis (suicidal ideations/suicide attempt).  With written consent from the patient, the family member/significant other has been provided the following suicide prevention education, prior to the and/or following the discharge of the patient.  The suicide prevention education provided includes the following: Suicide risk factors Suicide prevention and interventions National Suicide Hotline telephone number [facility] assessment telephone number [facility] Emergency Assistance 911 [facility] and/or Residential Mobile Crisis Unit telephone number  Request made of family/significant other to: Remove weapons (e.g., guns, rifles, knives), all items previously/currently identified as safety concern.   Remove drugs/medications (over-the-counter, prescriptions, illicit drugs), all items previously/currently identified as a safety concern.   [phone] (Friend) Alinda Money verbalizes understanding of the suicide prevention education information provided. Alinda Money agrees to remove the items of safety concern listed above.  Brelan Hannen S Mailin Coglianese 12/23/2022, 2:49 PM

## 2022-12-23 NOTE — Group Note (Signed)
LCSW Group Therapy Note  Group Date: 12/23/2022 Start Time: 1100 End Time: 1155   Type of Therapy and Topic:  Group Therapy - How To Cope with Nervousness about Discharge   Participation Level:  Active   Description of Group This process group involved identification of patients' feelings about discharge. Some of them are scheduled to be discharged soon, while others are new admissions, but each of them was asked to share thoughts and feelings surrounding discharge from the hospital. One common theme was that they are excited at the prospect of going home, while another was that many of them are apprehensive about sharing why they were hospitalized. Patients were given the opportunity to discuss these feelings with their peers in preparation for discharge.  Therapeutic Goals  Patient will identify their overall feelings about pending discharge. Patient will think about how they might proactively address issues that they believe will once again arise once they get home (i.e. with parents). Patients will participate in discussion about having hope for change.   Summary of Patient Progress:  Joshua Parrish was very active throughout the session. Joshua Parrish demonstrated good insight into the subject matter, and proved open to input from peers and feedback from CSW. Joshua Parrish was respectful of peers and participated throughout the entire session.   Therapeutic Modalities Cognitive Behavioral Therapy   Ane Payment, LCSW 12/23/2022  1:19 PM

## 2022-12-23 NOTE — Progress Notes (Signed)
Parrish, Joshua A, Chaplain  Joshua Parrish B, Chaplain Spiritual care group on Spiritual Resources highlighting hope today facilitated by Chaplain Katy Parrish, Bcc and Chaplain Philipp Callegari  Group Goal: Support / Education around spiritual resources - Hope  Members engage in facilitated group support and psycho-social education.  Group Description:  Following introductions and group rules, group members engaged in facilitated group dialogue and support around topic the topic of hope. There is particular support around what brings them hope and what makes them feel hopeless. Group identified types of hope and identified patterns, circumstances, and changes that precipitate being hopeless. Group reflected on thoughts / feelings around hope, normalized hopeless responses, and recognized a variety in hope experiences. Group encouraged individual reflection on what brings them hope and drew inspiration from pictures that represent hope to each of them.   Patient Progress: Patient did not attend group.   Joshua Parrish Chaplain 

## 2022-12-23 NOTE — Group Note (Deleted)
Date:  12/23/2022 Time:  8:42 AM  Group Topic/Focus:  Goals Group:   The focus of this group is to help patients establish daily goals to achieve during treatment and discuss how the patient can incorporate goal setting into their daily lives to aide in recovery.     Participation Level:  {[facility] PARTICIPATION LEVEL:22264}  Participation Quality:  {[facility] PARTICIPATION QUALITY:22265}  Affect:  {[facility] AFFECT:22266}  Cognitive:  {[facility] COGNITIVE:22267}  Insight: {[facility] Insight2:20797}  Engagement in Group:  {[facility] ENGAGEMENT IN GROUP:22268}  Modes of Intervention:  {[facility] MODES OF INTERVENTION:22269}  Additional Comments:  ***  Latarra Eagleton T Anzlee Hinesley 12/23/2022, 8:42 AM  

## 2022-12-23 NOTE — Progress Notes (Signed)
[facility] MD Progress Note  12/23/2022 3:58 PM Joshua Parrish  MRN:  [id number]  Reason for admission: This is the second psychiatric admission/evaluations in this [facility] in one month for this 47 year old male with prior hx of major depressive disorder. He was discharged from this [facility] on 11-16-22 after receiving treatments worsening depression after an overdose attempt using 38 tablets of hydroxyzine & rum. He claimed at the time that he was trying to fall asleep. He is being re-admitted to the [facility] with similar complaint of suicide attempt by overdose on Ibuprofen & blood pressure medications after being evicted from his apartment.  After medical evaluation/stabilization at the [facility], Joshua Parrish was brought to the [facility] for further psychiatric evaluation/treatments.     24-hour chart Review: Past 24 hours of patient's chart was reviewed.  Patient is compliant with scheduled meds As needed medication: None required Required Agitation PRNs: none Per RN notes, no documented behavioral issues and is attending group. Patient slept, 5 hours.    Daily notes: Joshua Parrish is seen and examined in the office sitting up in a chair.  Chart reviewed, and findings shared with the treatment team and discussed with the attending psychiatrist.  He presents alert, oriented & aware of situation. He is visible on the unit, and participating in therapeutic milieu and group activities. He presents with a good affect, good eye contact & verbally responsive. He reports, being compliant with his medications and having less depressed mood and anxious mood.  He reports reports anxiety as #1 over 10 and depression as #0/10, with 10 being high severity.  He reports sleeping over 5 hours last night due to being worried about reporting to court for his eviction notice coming up on 24 December 2022.  Patient added, I am not sure if I will be able to attend the court system as I am already in the hospital.  Instruct patient to work with the Arts development officer and this issue.  He continues on Effexor XR 75 mg po daily for depression, gabapentin 300 mg po tid for anxiety, and trazodone 100 mg p.o. q. nightly for insomnia.  Tolerating scheduled medication well without somatic discomfort.  Patient currently denies any SIHI, AVH, delusional thoughts or paranoia. He does not appear to be responding to any internal stimuli. Report swelling to legs possibly due to calcium channel blocker, hydrochlorothiazide ordered per attending psychiatrist. Will continue current treatment plan as already in progress.  Vital signs within normal limit with blood pressure of 117/62, and pulse of 72.   Principal Problem: Major depressive disorder, recurrent episode, severe  Diagnosis: Principal Problem:   Major depressive disorder, recurrent episode, severe Active Problems:   Suicidal ideations  Total Time spent with patient:  35 minutes  Past Psychiatric History: See H&P  Past Medical History:  Past Medical History:  Diagnosis Date   Anxiety    Chicken pox    Depression    Fatty liver    GERD (gastroesophageal reflux disease)    H/O hernia repair 03/31/2022   Hypertension    Migraines    Osteoarthritis of shoulder    left   Rotator cuff impingement syndrome of left shoulder 06/19/2015   Sinusitis    Sleep apnea     Past Surgical History:  Procedure Laterality Date   APPENDECTOMY     CERVICAL LAMINECTOMY     has plates and fusion   CHOLECYSTECTOMY N/A 12/24/2015   Procedure: LAPAROSCOPIC CHOLECYSTECTOMY ;  Surgeon: Gaynelle Adu, MD;  Location: [facility];  Service: General;  Laterality: N/A;   CLAVICLE EXCISION Left    HYPOSPADIAS CORRECTION     as child   KNEE SURGERY Bilateral    multiple   SHOULDER ARTHROSCOPY WITH ROTATOR CUFF REPAIR Left 06/26/2015   Procedure: SHOULDER ARTHROSCOPY WITH ROTATOR CUFF REPAIR;  Surgeon: Salvatore Marvel, MD;  Location: [facility];  Service: Orthopedics;  Laterality: Left;   Family History:  Family  History  Problem Relation Age of Onset   Arthritis Mother    Ovarian cancer Mother    Alcohol abuse Father    Breast cancer Maternal Uncle    Breast cancer Maternal Grandmother    Cancer Maternal Grandmother        spinal surgery   Cancer Other    Hypertension Other    Diabetes Other    Breast cancer Maternal Great-grandfather    Colon cancer Neg Hx    Sleep apnea Neg Hx    Family Psychiatric  History: See H&P.  Social History:  Social History   Substance and Sexual Activity  Alcohol Use Yes   Alcohol/week: 0.0 standard drinks of alcohol   Comment: occasional     Social History   Substance and Sexual Activity  Drug Use No    Social History   Socioeconomic History   Marital status: Divorced    Spouse name: Not on file   Number of children: 1   Years of education: 12   Highest education level: Not on file  Occupational History   Occupation: Garage Administrator, sports   Tobacco Use   Smoking status: Never   Smokeless tobacco: Current    Types: Chew   Tobacco comments:    form given 11/29/15  Vaping Use   Vaping Use: Never used  Substance and Sexual Activity   Alcohol use: Yes    Alcohol/week: 0.0 standard drinks of alcohol    Comment: occasional   Drug use: No   Sexual activity: Yes  Other Topics Concern   Not on file  Social History Narrative   Fun: Lift weights, hike   Social Determinants of Health   Financial Resource Strain: Not on file  Food Insecurity: No Food Insecurity (12/19/2022)   Hunger Vital Sign    Worried About Running Out of Food in the Last Year: Never true    Ran Out of Food in the Last Year: Never true  Transportation Needs: No Transportation Needs (12/19/2022)   PRAPARE - Administrator, Civil Service (Medical): No    Lack of Transportation (Non-Medical): No  Physical Activity: Not on file  Stress: Not on file  Social Connections: Not on file   Additional Social History:   Sleep: Fair  Appetite:  Good  Current  Medications: Current Facility-Administered Medications  Medication Dose Route Frequency Provider Last Rate Last Admin   acetaminophen (TYLENOL) tablet 650 mg  650 mg Oral Q6H PRN Sindy Guadeloupe, NP       alum & mag hydroxide-simeth (MAALOX/MYLANTA) 200-200-20 MG/5ML suspension 30 mL  30 mL Oral Q4H PRN Sindy Guadeloupe, NP       amLODipine (NORVASC) tablet 10 mg  10 mg Oral Daily Armandina Stammer I, NP   10 mg at 12/23/22 1610   gabapentin (NEURONTIN) capsule 300 mg  300 mg Oral TID Armandina Stammer I, NP   300 mg at 12/23/22 1152   hydrochlorothiazide (HYDRODIURIL) tablet 25 mg  25 mg Oral Daily Massengill, Harrold Donath, MD   25 mg at 12/23/22  6213   OLANZapine zydis (ZYPREXA) disintegrating tablet 10 mg  10 mg Oral Q8H PRN Sindy Guadeloupe, NP       And   LORazepam (ATIVAN) tablet 1 mg  1 mg Oral PRN Sindy Guadeloupe, NP       And   ziprasidone (GEODON) injection 20 mg  20 mg Intramuscular PRN Sindy Guadeloupe, NP       losartan (COZAAR) tablet 100 mg  100 mg Oral Daily Massengill, Nathan, MD   100 mg at 12/23/22 0865   magnesium hydroxide (MILK OF MAGNESIA) suspension 30 mL  30 mL Oral Daily PRN Sindy Guadeloupe, NP       pantoprazole (PROTONIX) EC tablet 40 mg  40 mg Oral Daily Armandina Stammer I, NP   40 mg at 12/23/22 0823   [START ON 12/25/2022] testosterone cypionate (DEPOTESTOSTERONE CYPIONATE) injection 100 mg  100 mg Intramuscular Q Thu Nwoko, Agnes I, NP       traZODone (DESYREL) tablet 100 mg  100 mg Oral QHS Nwoko, Nicole Kindred I, NP   100 mg at 12/22/22 2109   venlafaxine XR (EFFEXOR-XR) 24 hr capsule 75 mg  75 mg Oral Q breakfast Armandina Stammer I, NP   75 mg at 12/23/22 7846   Lab Results: No results found for this or any previous visit (from the past 48 hour(s)).  Blood Alcohol level:  Lab Results  Component Value Date   ETH 28 (H) 12/17/2022   ETH 23 (H) 11/11/2022   Metabolic Disorder Labs: Lab Results  Component Value Date   HGBA1C 5.4 10/14/2022   MPG 111 09/20/2021   MPG 111 08/21/2009   No results  found for: "PROLACTIN" Lab Results  Component Value Date   CHOL 229 (H) 04/19/2021   TRIG 359.0 (H) 04/19/2021   HDL 34.30 (L) 04/19/2021   CHOLHDL 7 04/19/2021   VLDL 71.8 (H) 04/19/2021   Physical Findings: AIMS:  , ,  ,  ,    CIWA:    COWS:     Musculoskeletal: Strength & Muscle Tone: within normal limits Gait & Station: normal Patient leans: N/A  Psychiatric Specialty Exam:  Presentation  General Appearance:  Appropriate for Environment; Casual  Eye Contact: Good  Speech: Clear and Coherent; Normal Rate  Speech Volume: Normal  Handedness: Right  Mood and Affect  Mood: Anxious; Depressed  Affect: Congruent  Thought Process  Thought Processes: Coherent  Descriptions of Associations:Intact  Orientation:Full (Time, Place and Person)  Thought Content:Logical  History of Schizophrenia/Schizoaffective disorder:No data recorded Duration of Psychotic Symptoms:No data recorded Hallucinations:Hallucinations: None  Ideas of Reference:None  Suicidal Thoughts:Suicidal Thoughts: No  Homicidal Thoughts:Homicidal Thoughts: No  Sensorium  Memory: Immediate Good; Recent Good  Judgment: Fair  Insight: Fair  Art therapist  Concentration: Good  Attention Span: Good  Recall: Fair  Fund of Knowledge: Fair  Language: Good  Psychomotor Activity  Psychomotor Activity: Psychomotor Activity: Normal  Assets  Assets: Communication Skills; Desire for Improvement; Physical Health  Sleep  Sleep: Sleep: Good Number of Hours of Sleep: 5  Physical Exam: Physical Exam Vitals and nursing note reviewed.  HENT:     Head: Normocephalic.     Nose: Nose normal.     Mouth/Throat:     Pharynx: Oropharynx is clear.  Eyes:     Pupils: Pupils are equal, round, and reactive to light.  Cardiovascular:     Rate and Rhythm: Normal rate.     Pulses: Normal pulses.  Pulmonary:     Effort: Pulmonary effort  is normal.  Genitourinary:     Comments: Deferred Musculoskeletal:        General: Normal range of motion.     Cervical back: Normal range of motion.  Skin:    General: Skin is warm and dry.  Neurological:     General: No focal deficit present.     Mental Status: He is alert and oriented to person, place, and time.  Psychiatric:        Mood and Affect: Mood normal.        Behavior: Behavior normal.    Review of Systems  Constitutional:  Negative for chills, diaphoresis and fever.  HENT:  Negative for congestion and sore throat.   Eyes:  Negative for blurred vision.  Respiratory:  Negative for cough, shortness of breath and wheezing.   Cardiovascular:  Negative for chest pain and palpitations.  Gastrointestinal:  Negative for abdominal pain, constipation, diarrhea, heartburn, nausea and vomiting.  Genitourinary:  Negative for dysuria.  Musculoskeletal:  Negative for joint pain and myalgias.  Skin:  Negative for itching and rash.  Neurological:  Negative for dizziness, tingling, tremors, sensory change, speech change, focal weakness, seizures, loss of consciousness, weakness and headaches.  Endo/Heme/Allergies:        See allergy lists.  Psychiatric/Behavioral:  Positive for depression and substance abuse (Hx alcohol abuse). Negative for hallucinations, memory loss and suicidal ideas. The patient has insomnia. The patient is not nervous/anxious.    Blood pressure 117/62, pulse 72, temperature (!) 97.2 F (36.2 C), temperature source Oral, resp. rate 20, height 5\' 8"  (1.727 m), weight 126.7 kg, SpO2 98 %. Body mass index is 42.48 kg/m.  Treatment Plan Summary: Daily contact with patient to assess and evaluate symptoms and progress in treatment and Medication management.   Continue inpatient hospitalization.  Will continue today 12/23/2022 plan as below except where it is noted.   Principal/active diagnoses. Major depressive disorder, recurrent episode, severe (HCC).  Alcohol use disorder, mild.   Associated  symptoms. Anxiety issues.  Insomnia.  Impulsivity.  Plan. -Continue Effexor XR 75 mg po daily for depression.  -Continue gabapentin 300 mg po tid for anxiety.   Agitation protocols: Cont as recommended;  -Olanzapine Zydis 10 mg Q 8 hrs prn. -Geodon 20 mg IM prn x 1 dose.  -Lorazepam 1 mg po IM prn X 1 dose.   Other medical issues.  -Continue Amlodipine 10 mg po daily for HTN.  -Continue Testosterone 100 mg IM Q Thursdays.  -Continue losartan-HCTZ 100-25 mg po daily for HTN.  -Continue Protonix 40 mg po Q am for GERD.  -Continue Albuterol inhaler 2 puff Q 4 hrs prn for SOB.  -Hydrochlorothiazide 25 mg p.o. daily for leg swelling with pitting  Other PRNS -Continue Tylenol 650 mg every 6 hours PRN for mild pain -Continue Maalox 30 ml Q 4 hrs PRN for indigestion -Continue MOM 30 ml po Q 6 hrs for constipation   Safety and Monitoring: Voluntary admission to inpatient psychiatric unit for safety, stabilization and treatment Daily contact with patient to assess and evaluate symptoms and progress in treatment Patient's case to be discussed in multi-disciplinary team meeting Observation Level : q15 minute checks Vital signs: q12 hours Precautions: Safety   Discharge Planning: Social work and case management to assist with discharge planning and identification of hospital follow-up needs prior to discharge Estimated LOS: 5-7 days Discharge Concerns: Need to establish a safety plan; Medication compliance and effectiveness Discharge Goals: Return home with outpatient referrals for mental health follow-up  including medication management/psychotherapy  Cecilie Lowers, FNP, pmhnp, fnp-bc 12/23/2022, 3:58 PMPatient ID: Joshua Parrish, male   DOB: [birth year], 47 y.o.   MRN: [id number] Patient ID: Joshua Parrish, male   DOB: 02/06/1976, 47 y.o.   MRN: [id number] Patient ID: Joshua Parrish, male   DOB: Feb 21, 1976, 47 y.o.   MRN: [id number]

## 2022-12-24 DIAGNOSIS — F332 Major depressive disorder, recurrent severe without psychotic features: Secondary | ICD-10-CM | POA: Diagnosis not present

## 2022-12-24 DIAGNOSIS — M7989 Other specified soft tissue disorders: Secondary | ICD-10-CM

## 2022-12-24 DIAGNOSIS — Z9151 Personal history of suicidal behavior: Secondary | ICD-10-CM

## 2022-12-24 LAB — D-DIMER, QUANTITATIVE: D-Dimer, Quant: 0.39 ug/mL-FEU (ref 0.00–0.50)

## 2022-12-24 MED ORDER — GABAPENTIN 100 MG PO CAPS
100.0000 mg | ORAL_CAPSULE | Freq: Three times a day (TID) | ORAL | Status: DC
  Administered 2022-12-24 – 2022-12-25 (×2): 100 mg via ORAL
  Filled 2022-12-24 (×9): qty 1

## 2022-12-24 NOTE — Consult Note (Signed)
Initial Consultation Note   Patient: Joshua Parrish AOZ:[id number] DOB: September 03, 1975 PCP: Olive Bass, FNP DOA: 12/19/2022 DOS: the patient was seen and examined on 12/24/2022 Primary service and MD requesting Consult: Phineas Inches, MD  Reason for consult: Leg swelling  Assessment/Plan: This is an unfortunate 47 year old gentleman with a history of depression, hypertension, peripheral neuropathy and prior suicide attempt who was admitted to behavioral health Hospital due to recurrent suicide attempt on 4/17.  He recently was admitted to behavioral health after admission for suicide attempt and discharged on 3/17.  Medical consultation was requested due to complaints of left lower extremity discomfort, and bilateral lower extremity swelling.  While he does not have any significant risk factors, his asymmetrical swelling with obvious calf tenderness prompts me to rule out DVT.  The following plan was discussed face-to-face in detail with Mr. Pagliuca as well as with Alan Mulder, NP. - discontinue Amlodipine today - if BP elevated in AM, increase HCTZ from  PO to  PO daily - Check D-dimer now, if elevated would recommend bilateral lower extremity venous Doppler ultrasound to rule out DVT.  If negative D-dimer, no further workup indicated. - [facility] will chart review in AM, and are available for questions  HPI: [name] is a 47 y.o. male with past medical history of hypertension, depression with prior suicide attempt in March 2024 who had recurrent suicide attempt with ibuprofen and amlodipine on 12/17/2022.  He has been admitted to behavioral health hospital, and seems to be doing well overall.  Internal medicine consultation was sought as the patient is complaining of bilateral lower extremity swelling, and left calf discomfort.  He says this started 2 to 3 days ago, he has had intermittent lower extremity swelling in the past, but never with pain, and never asymmetrical.  He denies any recent  travel, trauma, personal or family history of significant clotting or VTE.  He is not on any blood thinners.  Denies any chest pain, tachypnea, shortness of breath or pleuritic discomfort.  He has been taking amlodipine and gabapentin for years.  He normally takes Neurontin 300 mg p.o. 3 times daily, this was reduced by his primary team today to 100 mg p.o. 3 times daily.  His blood pressure has been under good control.  Review of Systems:  Past Medical History:  Diagnosis Date   Anxiety    Chicken pox    Depression    Fatty liver    GERD (gastroesophageal reflux disease)    H/O hernia repair 03/31/2022   Hypertension    Migraines    Osteoarthritis of shoulder    left   Rotator cuff impingement syndrome of left shoulder 06/19/2015   Sinusitis    Sleep apnea    Past Surgical History:  Procedure Laterality Date   APPENDECTOMY     CERVICAL LAMINECTOMY     has plates and fusion   CHOLECYSTECTOMY N/A 12/24/2015   Procedure: LAPAROSCOPIC CHOLECYSTECTOMY ;  Surgeon: Gaynelle Adu, MD;  Location: [facility];  Service: General;  Laterality: N/A;   CLAVICLE EXCISION Left    HYPOSPADIAS CORRECTION     as child   KNEE SURGERY Bilateral    multiple   SHOULDER ARTHROSCOPY WITH ROTATOR CUFF REPAIR Left 06/26/2015   Procedure: SHOULDER ARTHROSCOPY WITH ROTATOR CUFF REPAIR;  Surgeon: Salvatore Marvel, MD;  Location:  [facility];  Service: Orthopedics;  Laterality: Left;   Social History:  reports that he has never smoked. His smokeless tobacco use includes chew. He  reports current alcohol use. He reports that he does not use drugs.  Allergies  Allergen Reactions   Silicone Rash    Dermabond   Coconut Oil Diarrhea, Nausea And Vomiting and Other (See Comments)   Lisinopril Cough    Family History  Problem Relation Age of Onset   Arthritis Mother    Ovarian cancer Mother    Alcohol abuse Father    Breast cancer Maternal Uncle    Breast cancer Maternal Grandmother    Cancer Maternal  Grandmother        spinal surgery   Cancer Other    Hypertension Other    Diabetes Other    Breast cancer Maternal Great-grandfather    Colon cancer Neg Hx    Sleep apnea Neg Hx     Prior to Admission medications   Medication Sig Start Date End Date Taking? Authorizing Provider  amLODipine (NORVASC) 10 MG tablet TAKE 1 TABLET BY MOUTH DAILY 09/02/22   Olive Bass, FNP  anastrozole (ARIMIDEX) 1 MG tablet Take 1 mg by mouth every Monday, Wednesday, and Friday.    [provider]  gabapentin (NEURONTIN) 300 MG capsule Take 1 capsule (300 mg total) by mouth 3 (three) times daily. 11/16/22   Leevy-Johnson, Lina Sar, NP  omeprazole (PRILOSEC) 40 MG capsule TAKE ONE CAPSULE BY MOUTH DAILY 09/29/22   Olive Bass, FNP  pantoprazole (PROTONIX) 40 MG tablet Take 1 tablet (40 mg total) by mouth daily after lunch. 11/17/22   Leevy-Johnson, Lina Sar, NP  testosterone cypionate (DEPOTESTOSTERONE CYPIONATE) 200 MG/ML injection Inject 100 mg into the muscle every Thursday. 03/07/22   [provider]  valsartan-hydrochlorothiazide (DIOVAN-HCT) 320-25 MG tablet TAKE 1 TABLET BY MOUTH DAILY 09/02/22   Olive Bass, FNP  venlafaxine XR (EFFEXOR-XR) 75 MG 24 hr capsule Take 1 capsule (75 mg total) by mouth daily with breakfast. 11/17/22   Loletta Parish, NP    Physical Exam: Vitals:   12/23/22 0640 12/23/22 1703 12/24/22 0611 12/24/22 0613  BP: 117/62 116/66 131/86 123/80  Pulse: 72 92 81 85  Resp:      Temp:   (!) 97.5 F (36.4 C)   TempSrc:   Oral   SpO2: 98% 96%  99%  Weight:      Height:      General:  Alert, oriented, calm, in no acute distress, pleasant and cooperative Eyes: EOMI, clear conjuctivae, white sclerea Neck: supple, no masses, trachea mildline  Cardiovascular: RRR, no murmurs or rubs Respiratory: clear to auscultation bilaterally, no wheezes, no crackles  Abdomen: soft, nontender, nondistended, normal bowel tones heard  Skin: dry, no  rashes  Musculoskeletal: no joint effusions, normal range of motion.  He has asymmetrical nonpitting edema and tightness in his bilateral lower extremities from ankle to just below the knees, with some slight erythema overlying the left calf.  Left inner lower calf is also tender to palpation. Psychiatric: appropriate affect, normal speech  Neurologic: extraocular muscles intact, clear speech, moving all extremities with intact sensorium  Thank you very much for involving [place] in the care of your patient.  Author: Wilberth Damon Sharlette Dense, MD 12/24/2022 4:11 PM  For on call review [email or web address].

## 2022-12-24 NOTE — Group Note (Signed)
Recreation Therapy Group Note   Group Topic:Health and Wellness  Group Date: 12/24/2022 Start Time: 0930 End Time: 0955 Facilitators: Roemello Speyer-McCall, LRT,CTRS Location: [address] Dayroom   Goal Area(s) Addresses:  Patient will define components of whole wellness. Patient will verbalize benefit of whole wellness.   Group Description:  Exercise.  LRT and patients discussed the importance on physical exercise and its affects on the body.  LRT then explained to patients they would take turns leading the group in exercises of their choosing.  Patients were told to consider the physical abilities of peers and to anything that would be too challenging.  Patients could take breaks and get water as needed.     Affect/Mood: Appropriate   Participation Level: Engaged   Participation Quality: Independent   Behavior: Appropriate   Speech/Thought Process: Focused   Insight: Good   Judgement: Good   Modes of Intervention: Music   Patient Response to Interventions:  Engaged   Education Outcome:  In group clarification offered    Clinical Observations/Individualized Feedback: Pt attended, engaged and was social with peers.    Plan: Continue to engage patient in RT group sessions 2-3x/week.   Mishawn Hemann-McCall, LRT,CTRS 12/24/2022 1:19 PM

## 2022-12-24 NOTE — Plan of Care (Signed)
  Problem: Education: Goal: Emotional status will improve Outcome: Progressing Goal: Mental status will improve Outcome: Progressing   Problem: Activity: Goal: Interest or engagement in activities will improve Outcome: Progressing   Problem: Coping: Goal: Ability to verbalize frustrations and anger appropriately will improve Outcome: Progressing   Problem: Safety: Goal: Periods of time without injury will increase Outcome: Progressing   Problem: Coping: Goal: Ability to identify and develop effective coping behavior will improve Outcome: Progressing   Problem: Self-Concept: Goal: Level of anxiety will decrease Outcome: Progressing

## 2022-12-24 NOTE — Plan of Care (Signed)
  Problem: Safety: Goal: Periods of time without injury will increase Outcome: Progressing   

## 2022-12-24 NOTE — [facility] Group Notes (Signed)
Spiritual care group on grief and loss facilitated by Chaplain Dyanne Carrel, [facility]  Group Goal: Support / Education around grief and loss  Members engage in facilitated group support and psycho-social education.  Group Description:  Following introductions and group rules, group members engaged in facilitated group dialogue and support around topic of loss, with particular support around experiences of loss in their lives. Group Identified types of loss (relationships / self / things) and identified patterns, circumstances, and changes that precipitate losses. Reflected on thoughts / feelings around loss, normalized grief responses, and recognized variety in grief experience. Group encouraged individual reflection on safe space and on the coping skills that they are already utilizing.  Group drew on Adlerian / Rogerian and narrative framework  Patient Progress: Joshua Parrish attended group and actively engaged and participated in group conversation and activities.  There were times when his comments dominated the group, but he was able to be redirected.

## 2022-12-24 NOTE — Group Note (Unsigned)
Date:  12/24/2022 Time:  9:10 AM  Group Topic/Focus:  Goals Group:   The focus of this group is to help patients establish daily goals to achieve during treatment and discuss how the patient can incorporate goal setting into their daily lives to aide in recovery. Orientation:   The focus of this group is to educate the patient on the purpose and policies of crisis stabilization and provide a format to answer questions about their admission.  The group details unit policies and expectations of patients while admitted.     Participation Level:  {[facility] PARTICIPATION LEVEL:22264}  Participation Quality:  {[facility] PARTICIPATION QUALITY:22265}  Affect:  {[facility] AFFECT:22266}  Cognitive:  {[facility] COGNITIVE:22267}  Insight: {[facility] Insight2:20797}  Engagement in Group:  {[facility] ENGAGEMENT IN GROUP:22268}  Modes of Intervention:  {[facility] MODES OF INTERVENTION:22269}  Additional Comments:  ***  Ticara Waner Lashawn Chamia Schmutz 12/24/2022, 9:10 AM  

## 2022-12-24 NOTE — [facility] Group Notes (Signed)
[facility] Group Notes:  (Nursing/MHT/Case Management/Adjunct)  Date:  12/24/2022  Time:  12:30 AM  Type of Therapy:  Group Therapy  Participation Level:  Active  Participation Quality:  Appropriate  Affect:  Appropriate  Cognitive:  Appropriate  Insight:  Appropriate  Engagement in Group:  Engaged  Modes of Intervention:  Education  Summary of Progress/Problems: Goal to communicate moe. Reports he did well. Day 9/10. Participated in positive self talk exercise.   Joshua Parrish 12/24/2022, 12:30 AM

## 2022-12-24 NOTE — Progress Notes (Signed)
   12/24/22 2100  Psych Admission Type (Psych Patients Only)  Admission Status Involuntary  Psychosocial Assessment  Patient Complaints None  Eye Contact Fair  Facial Expression Animated  Affect Appropriate to circumstance  Speech Logical/coherent  Interaction Assertive  Motor Activity Other (Comment)  Appearance/Hygiene Other (Comment) (WNL)  Behavior Characteristics Cooperative;Calm  Mood Pleasant  Thought Process  Coherency WDL  Content WDL  Delusions None reported or observed  Perception WDL  Hallucination None reported or observed  Judgment Impaired  Confusion None  Danger to Self  Current suicidal ideation? Denies  Agreement Not to Harm Self Yes  Description of Agreement verbal  Danger to Others  Danger to Others None reported or observed   Alert/oriented. Makes needs/concerns known to staff. Pleasant cooperative with staff. Denies SI/HI/A/V hallucinations. Patient states went to group. Will encourage continued compliance and progression towards goals. Verbally contracted for safety. Will continue to monitor.

## 2022-12-24 NOTE — Progress Notes (Signed)
Patient reports swelling and pain in his legs and swelling in his hands that has gotten worse and was not relieved with stockings. Patient denies SI/HI/AVH and reports both depression and anxiety as 0/10. Stated goal is to go home to his cats. Patient is pleasant and active in the milieu   12/24/22 0915  Psych Admission Type (Psych Patients Only)  Admission Status Involuntary  Psychosocial Assessment  Patient Complaints None  Eye Contact Fair  Facial Expression Animated  Affect Appropriate to circumstance  Speech Logical/coherent  Interaction Assertive  Motor Activity Other (Comment) (WDL)  Appearance/Hygiene Unremarkable  Behavior Characteristics Cooperative;Calm  Mood Pleasant  Thought Process  Coherency WDL  Content WDL  Delusions None reported or observed  Perception WDL  Hallucination None reported or observed  Judgment Impaired  Confusion None  Danger to Self  Current suicidal ideation? Denies  Agreement Not to Harm Self Yes  Description of Agreement verbal  Danger to Others  Danger to Others None reported or observed

## 2022-12-24 NOTE — Group Note (Signed)
Date:  12/24/2022 Time:  10:09 AM  Group Topic/Focus:  Goals Group:   The focus of this group is to help patients establish daily goals to achieve during treatment and discuss how the patient can incorporate goal setting into their daily lives to aide in recovery. Orientation:   The focus of this group is to educate the patient on the purpose and policies of crisis stabilization and provide a format to answer questions about their admission.  The group details unit policies and expectations of patients while admitted.    Participation Level:  Active  Participation Quality:  Appropriate  Affect:  Appropriate  Cognitive:  Appropriate  Insight: Appropriate  Engagement in Group:  Engaged  Modes of Intervention:  Discussion  Additional Comments: Pt wants to be more cheerful  Jaquita Rector 12/24/2022, 10:09 AM

## 2022-12-24 NOTE — [facility] Group Notes (Signed)
[facility] Group Notes:  (Nursing/MHT/Case Management/Adjunct)  Date:  12/24/2022  Time:  8:15 PM  Type of Therapy:  Group Therapy  Participation Level:  Active  Participation Quality:  Appropriate  Affect:  Appropriate  Cognitive:  Appropriate  Insight:  Appropriate  Engagement in Group:  Engaged  Modes of Intervention:  Education  Summary of Progress/Problems: Attended AA/wrap up group.  Day 8/10.  Joshua Parrish 12/24/2022, 8:15 PM

## 2022-12-24 NOTE — Progress Notes (Signed)
   12/23/22 2130  Psych Admission Type (Psych Patients Only)  Admission Status Involuntary  Psychosocial Assessment  Patient Complaints None  Eye Contact Fair  Facial Expression Animated  Affect Appropriate to circumstance  Speech Logical/coherent  Interaction Assertive  Motor Activity Slow  Appearance/Hygiene Unremarkable  Behavior Characteristics Cooperative;Calm  Mood Pleasant  Thought Process  Coherency WDL  Content WDL  Delusions None reported or observed  Perception WDL  Hallucination None reported or observed  Judgment WDL  Confusion None  Danger to Self  Current suicidal ideation? Denies  Agreement Not to Harm Self Yes  Description of Agreement Verbal contract for safety  Danger to Others  Danger to Others None reported or observed   Pt reports 1/10 anxiety and 0/10 depression. Pt was offered support and encouragement. Pt was given scheduled medications and PRN Tylenol per MAR.  Pt attends groups and interacts well with peers and staff. Q 15 minute checks were done for safety. Pt has no complaints. Pt receptive to treatment and safety maintained on unit.

## 2022-12-24 NOTE — Progress Notes (Signed)
Patient ID: Joshua Parrish, male   DOB: [birth year], 47 y.o.   MRN: [id number] [facility] MD Progress Note  12/24/2022 5:10 PM Joshua Parrish  MRN:  [id number]  Reason for admission: This is the second psychiatric admission/evaluations in this [facility] in one month for this 47 year old male with prior hx of major depressive disorder. He was discharged from this [facility] on 11-16-22 after receiving treatments worsening depression after an overdose attempt using 38 tablets of hydroxyzine & rum. He claimed at the time that he was trying to fall asleep. He is being re-admitted to the [facility] with similar complaint of suicide attempt by overdose on Ibuprofen & blood pressure medications after being evicted from his apartment.  After medical evaluation/stabilization at the [facility], Joshua Parrish was brought to the [facility] for further psychiatric evaluation/treatments.     24-hour chart Review: Past 24 hours of patient's chart was reviewed.  Patient is compliant with scheduled meds As needed medication: Tylenol 650 mg given last night for mild pain Required Agitation PRNs: none Per RN notes, no documented behavioral issues and is attending group. Patient slept, 6 hours.    Daily notes: Joshua Parrish is seen and examined in his room sitting up on his bed..  Chart reviewed, and findings shared with the treatment team and discussed with the attending psychiatrist.  He presents alert, oriented & aware of situation. He is visible on the unit, and participating in therapeutic milieu and group activities. He presents with a good affect, good eye contact & verbally responsive. He continues to reports, his lower legs been swollen, tight and painful to touch.  Legs observed with slight swelling but without pitting edema or redness..  Internal medicine consult called, with Dr. Erenest Blank orders to discontinue amlodipine, and continue hydrochlorothiazide 25 mg p.o. daily until being seen by Dr. Erenest Blank today.  Patient is compliant with his medications and having less  depressed mood and anxious mood.  He reports reports anxiety as #0 over 10 and depression as #0/10, with 10 being high severity.  He reports sleeping over 6 hours last night.  Patient reports he is not worried about being evicted or not addicted, however if evicted he will move to his mom house until he can be stable enough to get a job.  Patient reports that this social worker is working on his behalf and has already sent some papers to the court regarding eviction court scheduled.  Today.  He continues on Effexor XR 75 mg po daily for depression, gabapentin 300 mg po tid for anxiety, and trazodone 100 mg p.o. q. nightly for insomnia.  Tolerating scheduled medication well without somatic discomfort.  Patient currently denies any SIHI, AVH, delusional thoughts or paranoia. He does not appear to be responding to any internal stimuli. Will continue current treatment plan as already in progress.  Vital signs within normal limit with blood pressure of 123 / 80, and pulse of 85.  Addendum: Internal medicine Dr. Reino Kent in to examine patient at [facility], with recommendation as follows: - if BP elevated in AM, increase HCTZ from  PO to  PO daily - Check D-dimer now, if elevated would recommend bilateral lower extremity venous Doppler ultrasound to rule out DVT.  If negative D-dimer, no further workup indicated. - TR[facility] will chart review in AM, and are available for questions   Principal Problem: Major depressive disorder, recurrent episode, severe  Diagnosis: Principal Problem:   Major depressive disorder, recurrent episode, severe Active Problems:   Suicidal ideations  Total Time spent with patient:  35 minutes  Past Psychiatric History: See H&P  Past Medical History:  Past Medical History:  Diagnosis Date   Anxiety    Chicken pox    Depression    Fatty liver    GERD (gastroesophageal reflux disease)    H/O hernia repair 03/31/2022   Hypertension    Migraines    Osteoarthritis of shoulder     left   Rotator cuff impingement syndrome of left shoulder 06/19/2015   Sinusitis    Sleep apnea     Past Surgical History:  Procedure Laterality Date   APPENDECTOMY     CERVICAL LAMINECTOMY     has plates and fusion   CHOLECYSTECTOMY N/A 12/24/2015   Procedure: LAPAROSCOPIC CHOLECYSTECTOMY ;  Surgeon: Gaynelle Adu, MD;  Location: [facility];  Service: General;  Laterality: N/A;   CLAVICLE EXCISION Left    HYPOSPADIAS CORRECTION     as child   KNEE SURGERY Bilateral    multiple   SHOULDER ARTHROSCOPY WITH ROTATOR CUFF REPAIR Left 06/26/2015   Procedure: SHOULDER ARTHROSCOPY WITH ROTATOR CUFF REPAIR;  Surgeon: Salvatore Marvel, MD;  Location: [facility];  Service: Orthopedics;  Laterality: Left;   Family History:  Family History  Problem Relation Age of Onset   Arthritis Mother    Ovarian cancer Mother    Alcohol abuse Father    Breast cancer Maternal Uncle    Breast cancer Maternal Grandmother    Cancer Maternal Grandmother        spinal surgery   Cancer Other    Hypertension Other    Diabetes Other    Breast cancer Maternal Great-grandfather    Colon cancer Neg Hx    Sleep apnea Neg Hx    Family Psychiatric  History: See H&P.  Social History:  Social History   Substance and Sexual Activity  Alcohol Use Yes   Alcohol/week: 0.0 standard drinks of alcohol   Comment: occasional     Social History   Substance and Sexual Activity  Drug Use No    Social History   Socioeconomic History   Marital status: Divorced    Spouse name: Not on file   Number of children: 1   Years of education: 12   Highest education level: Not on file  Occupational History   Occupation: Garage Administrator, sports   Tobacco Use   Smoking status: Never   Smokeless tobacco: Current    Types: Chew   Tobacco comments:    form given 11/29/15  Vaping Use   Vaping Use: Never used  Substance and Sexual Activity   Alcohol use: Yes    Alcohol/week: 0.0 standard drinks of alcohol     Comment: occasional   Drug use: No   Sexual activity: Yes  Other Topics Concern   Not on file  Social History Narrative   Fun: Lift weights, hike   Social Determinants of Health   Financial Resource Strain: Not on file  Food Insecurity: No Food Insecurity (12/19/2022)   Hunger Vital Sign    Worried About Running Out of Food in the Last Year: Never true    Ran Out of Food in the Last Year: Never true  Transportation Needs: No Transportation Needs (12/19/2022)   PRAPARE - Administrator, Civil Service (Medical): No    Lack of Transportation (Non-Medical): No  Physical Activity: Not on file  Stress: Not on file  Social Connections: Not on file   Additional Social History:  Sleep: Fair  Appetite:  Good  Current Medications: Current Facility-Administered Medications  Medication Dose Route Frequency Provider Last Rate Last Admin   acetaminophen (TYLENOL) tablet 650 mg  650 mg Oral Q6H PRN Sindy Guadeloupe, NP   650 mg at 12/23/22 2132   alum & mag hydroxide-simeth (MAALOX/MYLANTA) 200-200-20 MG/5ML suspension 30 mL  30 mL Oral Q4H PRN Sindy Guadeloupe, NP       gabapentin (NEURONTIN) capsule 100 mg  100 mg Oral TID Cecilie Lowers, FNP   100 mg at 12/24/22 1642   hydrochlorothiazide (HYDRODIURIL) tablet 25 mg  25 mg Oral Daily Massengill, Harrold Donath, MD   25 mg at 12/24/22 0830   OLANZapine zydis (ZYPREXA) disintegrating tablet 10 mg  10 mg Oral Q8H PRN Sindy Guadeloupe, NP       And   LORazepam (ATIVAN) tablet 1 mg  1 mg Oral PRN Sindy Guadeloupe, NP       And   ziprasidone (GEODON) injection 20 mg  20 mg Intramuscular PRN Sindy Guadeloupe, NP       losartan (COZAAR) tablet 100 mg  100 mg Oral Daily Massengill, Harrold Donath, MD   100 mg at 12/24/22 0831   magnesium hydroxide (MILK OF MAGNESIA) suspension 30 mL  30 mL Oral Daily PRN Sindy Guadeloupe, NP       pantoprazole (PROTONIX) EC tablet 40 mg  40 mg Oral Daily Nwoko, Nicole Kindred I, NP   40 mg at 12/24/22 0831   [START ON 12/25/2022] testosterone  cypionate (DEPOTESTOSTERONE CYPIONATE) injection 100 mg  100 mg Intramuscular Q Thu Nwoko, Agnes I, NP       traZODone (DESYREL) tablet 100 mg  100 mg Oral QHS Nwoko, Nicole Kindred I, NP   100 mg at 12/23/22 2132   venlafaxine XR (EFFEXOR-XR) 24 hr capsule 75 mg  75 mg Oral Q breakfast Armandina Stammer I, NP   75 mg at 12/24/22 0831   Lab Results: No results found for this or any previous visit (from the past 48 hour(s)).  Blood Alcohol level:  Lab Results  Component Value Date   ETH 28 (H) 12/17/2022   ETH 23 (H) 11/11/2022   Metabolic Disorder Labs: Lab Results  Component Value Date   HGBA1C 5.4 10/14/2022   MPG 111 09/20/2021   MPG 111 08/21/2009   No results found for: "PROLACTIN" Lab Results  Component Value Date   CHOL 229 (H) 04/19/2021   TRIG 359.0 (H) 04/19/2021   HDL 34.30 (L) 04/19/2021   CHOLHDL 7 04/19/2021   VLDL 71.8 (H) 04/19/2021   Physical Findings: AIMS:  , ,  ,  ,    CIWA:    COWS:     Musculoskeletal: Strength & Muscle Tone: within normal limits Gait & Station: normal Patient leans: N/A  Psychiatric Specialty Exam:  Presentation  General Appearance:  Appropriate for Environment; Casual; Fairly Groomed  Eye Contact: Good  Speech: Clear and Coherent; Normal Rate  Speech Volume: Normal  Handedness: Right  Mood and Affect  Mood: Anxious; Depressed  Affect: Congruent  Thought Process  Thought Processes: Coherent  Descriptions of Associations:Intact  Orientation:Full (Time, Place and Person)  Thought Content:Logical  History of Schizophrenia/Schizoaffective disorder:No data recorded Duration of Psychotic Symptoms:No data recorded Hallucinations:Hallucinations: None  Ideas of Reference:None  Suicidal Thoughts:Suicidal Thoughts: No  Homicidal Thoughts:Homicidal Thoughts: No  Sensorium  Memory: Immediate Good; Recent Good  Judgment: Fair  Insight: Fair  Executive Functions  Concentration: Good  Attention  Span: Good  Recall: [employer] of  Knowledge: Fair  Language: Good  Psychomotor Activity  Psychomotor Activity: Psychomotor Activity: Normal  Assets  Assets: Communication Skills; Desire for Improvement; Physical Health; Resilience; Social Support  Sleep  Sleep: Sleep: Good Number of Hours of Sleep: 6  Physical Exam: Physical Exam Vitals and nursing note reviewed.  HENT:     Head: Normocephalic.     Nose: Nose normal.     Mouth/Throat:     Pharynx: Oropharynx is clear.  Eyes:     Pupils: Pupils are equal, round, and reactive to light.  Cardiovascular:     Rate and Rhythm: Normal rate.     Pulses: Normal pulses.  Pulmonary:     Effort: Pulmonary effort is normal.  Genitourinary:    Comments: Deferred Musculoskeletal:        General: Normal range of motion.     Cervical back: Normal range of motion.  Skin:    General: Skin is warm and dry.  Neurological:     General: No focal deficit present.     Mental Status: He is alert and oriented to person, place, and time.  Psychiatric:        Mood and Affect: Mood normal.        Behavior: Behavior normal.        Thought Content: Thought content normal.    Review of Systems  Constitutional:  Negative for chills, diaphoresis and fever.  HENT:  Negative for congestion and sore throat.   Eyes:  Negative for blurred vision.  Respiratory:  Negative for cough, shortness of breath and wheezing.   Cardiovascular:  Negative for chest pain and palpitations.  Gastrointestinal:  Negative for abdominal pain, constipation, diarrhea, heartburn, nausea and vomiting.  Genitourinary:  Negative for dysuria.  Musculoskeletal:  Negative for joint pain and myalgias.  Skin:  Negative for itching and rash.  Neurological:  Negative for dizziness, tingling, tremors, sensory change, speech change, focal weakness, seizures, loss of consciousness, weakness and headaches.  Endo/Heme/Allergies:        See allergy lists.   Psychiatric/Behavioral:  Positive for depression and substance abuse (Hx alcohol abuse). Negative for hallucinations, memory loss and suicidal ideas. The patient has insomnia. The patient is not nervous/anxious.    Blood pressure 135/86, pulse 97, temperature (!) 97.5 F (36.4 C), temperature source Oral, resp. rate 20, height 5\' 8"  (1.727 m), weight 126.7 kg, SpO2 99 %. Body mass index is 42.48 kg/m.  Treatment Plan Summary: Daily contact with patient to assess and evaluate symptoms and progress in treatment and Medication management.   Continue inpatient hospitalization.  Will continue today 12/24/2022 plan as below except where it is noted.   Principal/active diagnoses. Major depressive disorder, recurrent episode, severe (HCC).  Alcohol use disorder, mild.   Associated symptoms. Anxiety issues.  Insomnia.  Impulsivity.  Plan. -Continue Effexor XR 75 mg po daily for depression.  -Continue gabapentin 300 mg po tid for anxiety.   Agitation protocols: Cont as recommended;  -Olanzapine Zydis 10 mg Q 8 hrs prn. -Geodon 20 mg IM prn x 1 dose.  -Lorazepam 1 mg po IM prn X 1 dose.   Other medical issues.  -Discontinue Amlodipine 10 mg po daily for HTN, 12/24/22 -Continue Testosterone 100 mg IM Q Thursdays.  -Continue losartan-HCTZ 100-25 mg po daily for HTN.  -Continue Protonix 40 mg po Q am for GERD.  -Continue Albuterol inhaler 2 puff Q 4 hrs prn for SOB.  -Hydrochlorothiazide 25 mg p.o. daily for leg swelling without pitting  Other PRNS -  Continue Tylenol 650 mg every 6 hours PRN for mild pain -Continue Maalox 30 ml Q 4 hrs PRN for indigestion -Continue MOM 30 ml po Q 6 hrs for constipation   Safety and Monitoring: Voluntary admission to inpatient psychiatric unit for safety, stabilization and treatment Daily contact with patient to assess and evaluate symptoms and progress in treatment Patient's case to be discussed in multi-disciplinary team meeting Observation Level :  q15 minute checks Vital signs: q12 hours Precautions: Safety   Discharge Planning: Social work and case management to assist with discharge planning and identification of hospital follow-up needs prior to discharge Estimated LOS: 5-7 days Discharge Concerns: Need to establish a safety plan; Medication compliance and effectiveness Discharge Goals: Return home with outpatient referrals for mental health follow-up including medication management/psychotherapy  Cecilie Lowers, FNP, pmhnp, fnp-bc 12/24/2022, 5:10 PMPatient ID: Ruthell Rummage, male   DOB: Sep 19, 1975, 47 y.o.   MRN: [id number] Patient ID: GIANKARLO LEAMER, male   DOB: June 08, 1976, 47 y.o.   MRN: [id number] Patient ID: YANIXAN MELLINGER, male   DOB: [birth year], 47 y.o.   MRN: [id number]

## 2022-12-24 NOTE — Group Note (Signed)
Date:  12/24/2022 Time:  2:34 PM  Group Topic/Focus:  Emotional Education:   The focus of this group is to discuss what feelings/emotions are, and how they are experienced.    Participation Level:  Did Not Attend  Participation Quality:    Affect:    Cognitive:    Insight:   Engagement in Group:    Modes of Intervention:    Additional Comments:    Joshua Parrish 12/24/2022, 2:34 PM  

## 2022-12-25 DIAGNOSIS — R600 Localized edema: Secondary | ICD-10-CM

## 2022-12-25 DIAGNOSIS — I10 Essential (primary) hypertension: Secondary | ICD-10-CM

## 2022-12-25 DIAGNOSIS — F332 Major depressive disorder, recurrent severe without psychotic features: Secondary | ICD-10-CM | POA: Diagnosis not present

## 2022-12-25 MED ORDER — LOSARTAN POTASSIUM 100 MG PO TABS: 100.0000 mg | ORAL_TABLET | Freq: Every day | ORAL | 0 refills | Status: DC

## 2022-12-25 MED ORDER — GABAPENTIN 100 MG PO CAPS: 100.0000 mg | ORAL_CAPSULE | Freq: Three times a day (TID) | ORAL | 0 refills | Status: DC

## 2022-12-25 MED ORDER — VENLAFAXINE HCL ER 75 MG PO CP24: 75.0000 mg | ORAL_CAPSULE | Freq: Every day | ORAL | 0 refills | Status: DC

## 2022-12-25 MED ORDER — TRAZODONE HCL 100 MG PO TABS: 100.0000 mg | ORAL_TABLET | Freq: Every day | ORAL | 0 refills | Status: DC

## 2022-12-25 MED ORDER — HYDROCHLOROTHIAZIDE 25 MG PO TABS: 25.0000 mg | ORAL_TABLET | Freq: Every day | ORAL | 0 refills | Status: DC

## 2022-12-25 NOTE — Progress Notes (Signed)
  [facility] Adult Case Management Discharge Plan :  Will you be returning to the same living situation after discharge:  Yes,  pt will be returning to his residence At discharge, do you have transportation home?: No.Taxi Do you have the ability to pay for your medications: Yes,  insured  Release of information consent forms completed and in the chart;  Patient's signature needed at discharge.  Patient to Follow up at:  Follow-up Information     Olive Bass, FNP .   Specialty: Internal Medicine Contact information: [address] [place] [address] [phone]         [facility]. Go on 01/06/2023.   Why: Patient must complete screen prior to Jan 06, 2023. Your appointment time is 11;00 am with Dr. Celene Kras, Contact information: [address], [place], [place] [address] Hours:  Open  Closes 5:30?PM Phone: [phone]                Next level of care provider has access to [facility] Link:yes  Safety Planning and Suicide Prevention discussed: Doran Durand [phone] (Friend)     Has patient been referred to the Quitline?: Patient refused referral  Patient has been referred for addiction treatment: N/A Patient to continue working towards treatment goals after discharge. Patient no longer meets criteria for inpatient criteria per attending physician. Continue taking medications as prescribed, nursing to provide instructions at discharge. Follow up with all scheduled appointments.   Aloria Looper S Kadden Osterhout, LCSW 12/25/2022, 10:08 AM

## 2022-12-25 NOTE — Progress Notes (Addendum)
PROGRESS NOTE  Joshua Parrish  ZOX:[id number] DOB: 18-Jun-1976 DOA: 12/19/2022 PCP: Olive Bass, FNP   Brief Narrative: Patient is a 47 year old male with history of depression, hypertension, peripheral neuropathy who has [facility] for [facility] attempt and was discharged on 3/17.  He was readmitted because of overdose with ibuprofen and blood pressure medications after being evicted from his apartment.  We were consulted for the evaluation of bilateral lower extremity asymmetrical nonpitting edema.  Assessment & Plan:  Principal Problem:   Major depressive disorder, recurrent episode, severe Active Problems:   Suicidal ideations   Bilateral nonpitting symmetric lower extremity edema: We were consulted for this.  He was taking amlodipine which has been discontinued.  Amlodipine can cause lower extremity edema. D-dimer was checked it has been negative.  Probability of venous embolism is low in the setting of negative D-dimer.  Will not proceed with further workup.  Hypertension: Currently blood pressure stable.  Amlodipine has been discontinued.  On HCTZ, losartan.  Overdose /suicidal attempt: Management as per psychiatry  As per psychiatry, patient's lower extremity edema is better today.  Will recommend lower extremity elevation while lying on bed.  He is also planning to follow-up with his primary care physician in a week.  Patient is being  discharged today as per psychiatry       Antimicrobials:  Anti-infectives (From admission, onward)    None         Objective: Vitals:   12/24/22 0613 12/24/22 1649 12/25/22 0623 12/25/22 0624  BP: 123/80 135/86 130/81 122/78  Pulse: 85 97 66 88  Resp:   (!) 22   Temp:   97.7 F (36.5 C)   TempSrc:   Oral   SpO2: 99% 99%  96%  Weight:      Height:       No intake or output data in the 24 hours ending 12/25/22 0722 Filed Weights   12/19/22 1200  Weight: 126.7 kg    Data Reviewed: I have  personally reviewed following labs and imaging studies  CBC: No results for input(s): "WBC", "NEUTROABS", "HGB", "HCT", "MCV", "PLT" in the last 168 hours. Basic Metabolic Panel: Recent Labs  Lab 12/19/22 1004  K 3.5     Recent Results (from the past 240 hour(s))  SARS Coronavirus 2 by RT PCR (hospital order, performed in [facility] hospital lab) *cepheid single result test* Anterior Nasal Swab     Status: None   Collection Time: 12/18/22  4:41 AM   Specimen: Anterior Nasal Swab  Result Value Ref Range Status   SARS Coronavirus 2 by RT PCR NEGATIVE NEGATIVE Final    Comment: (NOTE) SARS-CoV-2 target nucleic acids are NOT DETECTED.  The SARS-CoV-2 RNA is generally detectable in upper and lower respiratory specimens during the acute phase of infection. The lowest concentration of SARS-CoV-2 viral copies this assay can detect is 250 copies / mL. A negative result does not preclude SARS-CoV-2 infection and should not be used as the sole basis for treatment or other patient management decisions.  A negative result may occur with improper specimen collection / handling, submission of specimen other than nasopharyngeal swab, presence of viral mutation(s) within the areas targeted by this assay, and inadequate number of viral copies (<250 copies / mL). A negative result must be combined with clinical observations, patient history, and epidemiological information.  Fact Sheet for Patients:   RoadLapTop.co.za  Fact Sheet for Healthcare Providers: http://kim-miller.com/  This test is not yet  approved or  cleared by the [place] and has been authorized for detection and/or diagnosis of SARS-CoV-2 by FDA under an Emergency Use Authorization (EUA).  This EUA will remain in effect (meaning this test can be used) for the duration of the COVID-19 declaration under Section 564(b)(1) of the Act, 21 U.S.C. section 360bbb-3(b)(1), unless  the authorization is terminated or revoked sooner.  Performed at [facility], [address], [place], [place] [address]      Radiology Studies: No results found.  Scheduled Meds:  gabapentin  100 mg Oral TID   hydrochlorothiazide  25 mg Oral Daily   losartan  100 mg Oral Daily   pantoprazole  40 mg Oral Daily   testosterone cypionate  100 mg Intramuscular Q Thu   traZODone  100 mg Oral QHS   venlafaxine XR  75 mg Oral Q breakfast   Continuous Infusions:   LOS: 6 days   Burnadette Pop, MD [facility] P4/25/2024, 7:22 AM

## 2022-12-25 NOTE — Discharge Summary (Signed)
Physician Discharge Summary Note  Patient:  Joshua Parrish is an 47 y.o., male MRN:  [id number] DOB:  02/27/1976 Patient phone:  [phone] (home)  Patient address:   [address] [place] [address],  Total Time spent with patient: 30 minutes  Date of Admission:  12/19/2022 Date of Discharge:   12/25/2022  Reason for Admission:  This is the second psychiatric admission/evaluations in this [facility] in one month for this 47 year old male with prior hx of major depressive disorder. He was discharged from this [facility] on 11-16-22 after receiving treatments worsening depression after an overdose attempt using 38 tablets of hydroxyzine & rum. He claimed at the time that he was trying to fall asleep. He is being re-admitted to the [facility] with similar complaint of suicide attempt by overdose on Ibuprofen & blood pressure medications after being evicted from his apartment.  After medical evaluation/stabilization at the [facility], Joshua Parrish was brought to the [facility] for further psychiatric evaluation/treatments.    Addendum: Patient complained of leg swelling resolved.  D-dimer lab obtained yesterday within normal limit of 0.39.  Principal Problem: Major depressive disorder, recurrent episode, severe Discharge Diagnoses: Principal Problem:   Major depressive disorder, recurrent episode, severe Active Problems:   Suicidal ideations  Past Psychiatric History: Major depressive disorder, anxiety disorder.   Past Medical History:  Past Medical History:  Diagnosis Date   Anxiety    Chicken pox    Depression    Fatty liver    GERD (gastroesophageal reflux disease)    H/O hernia repair 03/31/2022   Hypertension    Migraines    Osteoarthritis of shoulder    left   Rotator cuff impingement syndrome of left shoulder 06/19/2015   Sinusitis    Sleep apnea     Past Surgical History:  Procedure Laterality Date   APPENDECTOMY     CERVICAL LAMINECTOMY     has plates and fusion    CHOLECYSTECTOMY N/A 12/24/2015   Procedure: LAPAROSCOPIC CHOLECYSTECTOMY ;  Surgeon: Gaynelle Adu, MD;  Location: [facility];  Service: General;  Laterality: N/A;   CLAVICLE EXCISION Left    HYPOSPADIAS CORRECTION     as child   KNEE SURGERY Bilateral    multiple   SHOULDER ARTHROSCOPY WITH ROTATOR CUFF REPAIR Left 06/26/2015   Procedure: SHOULDER ARTHROSCOPY WITH ROTATOR CUFF REPAIR;  Surgeon: Salvatore Marvel, MD;  Location: [facility];  Service: Orthopedics;  Laterality: Left;   Family History:  Family History  Problem Relation Age of Onset   Arthritis Mother    Ovarian cancer Mother    Alcohol abuse Father    Breast cancer Maternal Uncle    Breast cancer Maternal Grandmother    Cancer Maternal Grandmother        spinal surgery   Cancer Other    Hypertension Other    Diabetes Other    Breast cancer Maternal Great-grandfather    Colon cancer Neg Hx    Sleep apnea Neg Hx    Family Psychiatric  History:  Major depressive disorder: Father.                                                  Completed suicide: Father. Social History:  Social History   Substance and Sexual Activity  Alcohol Use Yes   Alcohol/week: 0.0 standard drinks of  alcohol   Comment: occasional     Social History   Substance and Sexual Activity  Drug Use No    Social History   Socioeconomic History   Marital status: Divorced    Spouse name: Not on file   Number of children: 1   Years of education: 12   Highest education level: Not on file  Occupational History   Occupation: Garage Administrator, sports   Tobacco Use   Smoking status: Never   Smokeless tobacco: Current    Types: Chew   Tobacco comments:    form given 11/29/15  Vaping Use   Vaping Use: Never used  Substance and Sexual Activity   Alcohol use: Yes    Alcohol/week: 0.0 standard drinks of alcohol    Comment: occasional   Drug use: No   Sexual activity: Yes  Other Topics Concern   Not on file  Social History Narrative   Fun:  Lift weights, hike   Social Determinants of Health   Financial Resource Strain: Not on file  Food Insecurity: No Food Insecurity (12/19/2022)   Hunger Vital Sign    Worried About Running Out of Food in the Last Year: Never true    Ran Out of Food in the Last Year: Never true  Transportation Needs: No Transportation Needs (12/19/2022)   PRAPARE - Administrator, Civil Service (Medical): No    Lack of Transportation (Non-Medical): No  Physical Activity: Not on file  Stress: Not on file  Social Connections: Not on file   Hospital Course:  During the patient's hospitalization, patient had extensive initial psychiatric evaluation, and follow-up psychiatric evaluations every day.  Psychiatric diagnoses provided upon initial assessment:  Major depressive disorder, recurrent episode, severe  Patient's psychiatric medications were adjusted on admission:  Resume Effexor XR 75 mg p.o. daily for depression Agitation protocols: Cont as recommended;  -Olanzapine Zydis 10 mg Q 8 hrs prn. -Geodon 20 mg IM prn x 1 dose.  -Lorazepam 1 mg po IM prn X 1 dose.  During the hospitalization, other adjustments were made to the patient's psychiatric medication regimen:  Gabapentin capsule 100 mg p.o. 3 times daily Trazodone tablet 100 mg p.o. daily at bedtime for insomnia  Patient's care was discussed during the interdisciplinary team meeting every day during the hospitalization.  The patient denies having side effects to prescribed psychiatric medication.  Gradually, patient started adjusting to milieu. The patient was evaluated each day by a clinical provider to ascertain response to treatment. Improvement was noted by the patient's report of decreasing symptoms, improved sleep and appetite, affect, medication tolerance, behavior, and participation in unit programming.  Patient was asked each day to complete a self inventory noting mood, mental status, pain, new symptoms, anxiety and concerns.     Symptoms were reported as significantly decreased or resolved completely by discharge.   On day of discharge, the patient reports that their mood is stable. The patient denied having suicidal thoughts for more than 48 hours prior to discharge.  Patient denies having homicidal thoughts.  Patient denies having auditory hallucinations.  Patient denies any visual hallucinations or other symptoms of psychosis. The patient was motivated to continue taking medication with a goal of continued improvement in mental health.   The patient reports their target psychiatric symptoms of depression responded well to the psychiatric medications, and the patient reports overall benefit other psychiatric hospitalization. Supportive psychotherapy was provided to the patient. The patient also participated in regular group therapy while hospitalized. Coping  skills, problem solving as well as relaxation therapies were also part of the unit programming.  Labs were reviewed with the patient, and abnormal results were discussed with the patient.  The patient is able to verbalize their individual safety plan to this provider.  # It is recommended to the patient to continue psychiatric medications as prescribed, after discharge from the hospital.    # It is recommended to the patient to follow up with your outpatient psychiatric provider and PCP.  # It was discussed with the patient, the impact of alcohol, drugs, tobacco have been there overall psychiatric and medical wellbeing, and total abstinence from substance use was recommended the patient.ed.  # Prescriptions provided or sent directly to preferred pharmacy at discharge. Patient agreeable to plan. Given opportunity to ask questions. Appears to feel comfortable with discharge.    # In the event of worsening symptoms, the patient is instructed to call the crisis hotline, 911 and or go to the nearest ED for appropriate evaluation and treatment of symptoms. To follow-up  with primary care provider for other medical issues, concerns and or health care needs  # Patient was discharged to home with a plan to follow up as noted below.  Physical Findings: AIMS:  , ,  ,  ,    CIWA:    COWS:     Musculoskeletal: Strength & Muscle Tone: within normal limits Gait & Station: normal Patient leans: N/A   Psychiatric Specialty Exam:  Presentation  General Appearance:  Appropriate for Environment; Casual; Fairly Groomed  Eye Contact: Good  Speech: Normal Rate; Clear and Coherent  Speech Volume: Normal  Handedness: Right   Mood and Affect  Mood: Euthymic  Affect: Appropriate; Congruent; Full Range   Thought Process  Thought Processes: Linear  Descriptions of Associations:Intact  Orientation:Full (Time, Place and Person)  Thought Content:Logical  History of Schizophrenia/Schizoaffective disorder:No data recorded Duration of Psychotic Symptoms:No data recorded Hallucinations:Hallucinations: None  Ideas of Reference:None  Suicidal Thoughts:Suicidal Thoughts: No  Homicidal Thoughts:Homicidal Thoughts: No   Sensorium  Memory: Immediate Good; Recent Good; Remote Good  Judgment: Good  Insight: Good  Executive Functions  Concentration: Good  Attention Span: Good  Recall: Good  Fund of Knowledge: Good  Language: Good  Psychomotor Activity  Psychomotor Activity: Psychomotor Activity: Normal  Assets  Assets: Communication Skills; Desire for Improvement; Physical Health; Resilience; Social Support  Sleep  Sleep: Sleep: Fair Number of Hours of Sleep: 6  Physical Exam: Physical Exam Vitals and nursing note reviewed.  HENT:     Head: Normocephalic.     Nose: Nose normal.     Mouth/Throat:     Mouth: Mucous membranes are moist.     Pharynx: Oropharynx is clear.  Eyes:     Conjunctiva/sclera: Conjunctivae normal.     Pupils: Pupils are equal, round, and reactive to light.  Cardiovascular:     Rate and  Rhythm: Normal rate.     Pulses: Normal pulses.  Pulmonary:     Effort: Pulmonary effort is normal.  Abdominal:     Palpations: Abdomen is soft.  Genitourinary:    Comments: Deferred Musculoskeletal:        General: Normal range of motion.     Cervical back: Normal range of motion.  Skin:    General: Skin is warm.  Neurological:     General: No focal deficit present.     Mental Status: He is alert and oriented to person, place, and time.  Psychiatric:  Mood and Affect: Mood normal.        Behavior: Behavior normal.        Thought Content: Thought content normal.    Review of Systems  Constitutional: Negative.   HENT: Negative.    Eyes: Negative.   Respiratory: Negative.    Cardiovascular: Negative.   Gastrointestinal: Negative.   Genitourinary: Negative.   Musculoskeletal: Negative.   Skin: Negative.   Neurological: Negative.   Endo/Heme/Allergies: Negative.   Psychiatric/Behavioral:  Positive for depression (Stable with medication). The patient is nervous/anxious (Improved with medication) and has insomnia (Improved with medication).    Blood pressure 122/78, pulse 88, temperature 97.7 F (36.5 C), temperature source Oral, resp. rate (!) 22, height 5\' 8"  (1.727 m), weight 126.7 kg, SpO2 96 %. Body mass index is 42.48 kg/m.   Social History   Tobacco Use  Smoking Status Never  Smokeless Tobacco Current   Types: Chew  Tobacco Comments   form given 11/29/15   Tobacco Cessation:  N/A, patient does not currently use tobacco products   Blood Alcohol level:  Lab Results  Component Value Date   ETH 28 (H) 12/17/2022   ETH 23 (H) 11/11/2022    Metabolic Disorder Labs:  Lab Results  Component Value Date   HGBA1C 5.4 10/14/2022   MPG 111 09/20/2021   MPG 111 08/21/2009   No results found for: "PROLACTIN" Lab Results  Component Value Date   CHOL 229 (H) 04/19/2021   TRIG 359.0 (H) 04/19/2021   HDL 34.30 (L) 04/19/2021   CHOLHDL 7 04/19/2021   VLDL  71.8 (H) 04/19/2021    See Psychiatric Specialty Exam and Suicide Risk Assessment completed by Attending Physician prior to discharge.  Discharge destination:  Home  Is patient on multiple antipsychotic therapies at discharge:  No   Has Patient had three or more failed trials of antipsychotic monotherapy by history:  No  Recommended Plan for Multiple Antipsychotic Therapies: NA  Discharge Instructions     Diet - low sodium heart healthy   Complete by: As directed    Increase activity slowly   Complete by: As directed       Allergies as of 12/25/2022       Reactions   Silicone Rash   Dermabond   Coconut Oil Diarrhea, Nausea And Vomiting, Other (See Comments)   Lisinopril Cough        Medication List     STOP taking these medications    amLODipine 10 MG tablet Commonly known as: NORVASC   anastrozole 1 MG tablet Commonly known as: ARIMIDEX   pantoprazole 40 MG tablet Commonly known as: PROTONIX   valsartan-hydrochlorothiazide 320-25 MG tablet Commonly known as: DIOVAN-HCT       TAKE these medications      Indication  gabapentin 100 MG capsule Commonly known as: NEURONTIN Take 1 capsule (100 mg total) by mouth 3 (three) times daily. What changed:  medication strength how much to take  Indication: Generalized Anxiety Disorder, Anxiety/pain   hydrochlorothiazide 25 MG tablet Commonly known as: HYDRODIURIL Take 1 tablet (25 mg total) by mouth daily. Start taking on: December 26, 2022  Indication: Edema, High Blood Pressure Disorder   losartan 100 MG tablet Commonly known as: COZAAR Take 1 tablet (100 mg total) by mouth daily. Start taking on: December 26, 2022  Indication: High Blood Pressure Disorder   omeprazole 40 MG capsule Commonly known as: PRILOSEC TAKE ONE CAPSULE BY MOUTH DAILY  Indication: Gastroesophageal Reflux Disease   testosterone cypionate  200 MG/ML injection Commonly known as: DEPOTESTOSTERONE CYPIONATE Inject 100 mg into the  muscle every Thursday.  Indication: Deficient Activity of the Testis   traZODone 100 MG tablet Commonly known as: DESYREL Take 1 tablet (100 mg total) by mouth at bedtime.  Indication: Trouble Sleeping, Major Depressive Disorder   venlafaxine XR 75 MG 24 hr capsule Commonly known as: EFFEXOR-XR Take 1 capsule (75 mg total) by mouth daily with breakfast.  Indication: Major Depressive Disorder        Follow-up Information     Olive Bass, FNP .   Specialty: Internal Medicine Why: Call for appointment to see PCP Contact information: [address] [place] [address] [phone]         [facility]. Go on 01/06/2023.   Why: Patient must complete screen prior to Jan 06, 2023. Your appointment time is 11;00 am with Dr. Celene Kras, Contact information: [address], [place], [place] [address] Hours:  Open  Closes 5:30?PM Phone: [phone]        Coun[facility], [facility]. Schedule an appointment as soon as possible for a visit.   Contact information: [address] [place] [address] [phone]                 Follow-up recommendations:   Discharge Recommendations:  The patient is being discharged with his family. Patient is to take his discharge medications as ordered.  See follow up above. We recommend that he participates in individual therapy to target uncontrollable agitation and substance abuse.  We recommend that he participates in family therapy to target the conflict with his family, to improve communication skills and conflict resolution skills.  Family is to initiate/implement a contingency based behavioral model to address patient's behavior. We recommend that he gets AIMS scale, height, weight, blood pressure, fasting lipid panel, fasting blood sugar in three months from discharge if he's on atypical antipsychotics.  Patient will benefit from monitoring of recurrent suicidal  ideation since patient is on antidepressant medication. The patient should abstain from all illicit substances and alcohol.  If the patient's symptoms worsen or do not continue to improve or if the patient becomes actively suicidal or homicidal then it is recommended that the patient return to the closest hospital emergency room or call 911 for further evaluation and treatment. National Suicide Prevention Lifeline 1800-SUICIDE or 619-644-8454. Please follow up with your primary medical doctor for all other medical needs.  The patient has been educated on the possible side effects to medications and he/his guardian is to contact a medical professional and inform outpatient provider of any new side effects of medication. He is to take regular diet and activity as tolerated.  Will benefit from moderate daily exercise. Patient/Family was educated about removing/locking any firearms, medications or dangerous products from the home.   Activity:  As tolerated Diet:  Regular no added salt  Signed: Cecilie Lowers, FNP 12/25/2022, 10:46 AM
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
## 2022-12-25 NOTE — BHH Counselor (Signed)
12/25/2022  Joshua Parrish DOB: 07-Jun-1976 MRN: [id number]   RIDER WAIVER AND RELEASE OF LIABILITY  For the purposes of helping with transportation needs, [facility] partners with outside transportation providers (taxi companies, [place], Catering manager.) to give [employer] patients or other approved people the choice of on-demand rides [employer]") to our buildings for non-emergency visits.  By using [employer], I, the person signing this document, on behalf of myself and/or any legal minors (in my care using the [employer]), agree:  Science writer given to me are supplied by independent, outside transportation providers who do not work for, or have any affiliation with, [employer]. [employer] is not a transportation company. [place] has no control over the quality or safety of the rides I get using [employer]. [place] has no control over whether any outside ride will happen on time or not. [place] gives no guarantee on the reliability, quality, safety, or availability on any rides, or that no mistakes will happen. I know and accept that traveling by vehicle (car, truck, SVU, Zenaida Niece, bus, taxi, etc.) has risks of serious injuries such as disability, being paralyzed, and death. I know and agree the risk of using [employer] is mine alone, and not [employer]. Transport Services are provided "as is" and as are available. The transportation providers are in charge for all inspections and care of the vehicles used to provide these rides. I agree not to take legal action against [place], its agents, employees, officers, directors, representatives, insurers, attorneys, assigns, successors, subsidiaries, and affiliates at any time for any reasons related directly or indirectly to using [employer]. I also agree not to take legal action against [place] or its affiliates for any injury, death, or damage to property caused by or related to using  [employer]. I have read this Waiver and Release of Liability, and I understand the terms used in it and their legal meaning. This Waiver is freely and voluntarily given with the understanding that my right (or any legal minors) to legal action against [place] relating to [employer] is knowingly given up to use these services.   I attest that I read the Ride Waiver and Release of Liability to Joshua Parrish, gave Mr. Chicas the opportunity to ask questions and answered the questions asked (if any). I affirm that Joshua Parrish then provided consent for assistance with transportation.

## 2022-12-25 NOTE — Progress Notes (Signed)
Patient discharged to home via cab. Denied audio/visual hallucinations, denied SI/HI. All discharge information explained, patient verbalized understanding. All belongings from locker, including cell phone returned to patient.

## 2022-12-25 NOTE — Discharge Instructions (Addendum)
-  Follow-up with your outpatient psychiatric provider -instructions on appointment date, time, and address (location) are provided to you in discharge paperwork.  -Take your psychiatric medications as prescribed at discharge - instructions are provided to you in the discharge paperwork  -Follow-up with outpatient primary care doctor and other specialists -for management of preventative medicine and any chronic medical disease. As we discussed, please f/u with PCP for edema and hypertension within 5 days of discharge from this hospital.   -Recommend abstinence from alcohol, tobacco, and other illicit drug use at discharge.   -If your psychiatric symptoms recur, worsen, or if you have side effects to your psychiatric medications, call your outpatient psychiatric provider, 911, [phone] or go to the nearest emergency department.  -If suicidal thoughts occur, call your outpatient psychiatric provider, 911, [phone] or go to the nearest emergency department.  Naloxone (Narcan) can help reverse an overdose when given to the victim quickly.  [facility] offers free naloxone kits and instructions/training on its use.  Add naloxone to your first aid kit and you can help save a life.   Pick up your free kit at the following locations:   :  [facility] of [facility], [address] [place] [address] [phone]) [facility] [address] [place] [address] [phone]) [facility] [address] [place] [address]  [place]: [facility] of [facility] [address] [place] [address] ([phone]) [facility] [address] [place] [address] [phone])

## 2022-12-25 NOTE — Progress Notes (Signed)
   12/25/22 0900  Psych Admission Type (Psych Patients Only)  Admission Status Involuntary  Psychosocial Assessment  Patient Complaints None  Eye Contact Fair  Facial Expression Animated  Affect Appropriate to circumstance  Speech Logical/coherent;Soft  Interaction Assertive  Motor Activity Other (Comment) (reports OCD behaviors)  Appearance/Hygiene Unremarkable  Behavior Characteristics Cooperative;Appropriate to situation;Calm  Mood Pleasant  Thought Process  Coherency WDL  Content WDL  Delusions None reported or observed  Perception WDL  Hallucination None reported or observed  Judgment WDL  Confusion None  Danger to Self  Current suicidal ideation? Denies  Agreement Not to Harm Self Yes  Description of Agreement verbal  Danger to Others  Danger to Others None reported or observed

## 2022-12-26 ENCOUNTER — Encounter: Payer: Self-pay | Admitting: Family

## 2022-12-26 ENCOUNTER — Ambulatory Visit (INDEPENDENT_AMBULATORY_CARE_PROVIDER_SITE_OTHER): Payer: Managed Care, Other (non HMO) | Admitting: Family

## 2022-12-26 VITALS — BP 138/88 | HR 99 | Resp 19 | Ht 68.0 in | Wt 289.8 lb

## 2022-12-26 DIAGNOSIS — I10 Essential (primary) hypertension: Secondary | ICD-10-CM

## 2022-12-26 DIAGNOSIS — F332 Major depressive disorder, recurrent severe without psychotic features: Secondary | ICD-10-CM | POA: Diagnosis not present

## 2022-12-26 MED ORDER — VALSARTAN-HYDROCHLOROTHIAZIDE 320-25 MG PO TABS: 1.0000 | ORAL_TABLET | Freq: Every day | ORAL | 0 refills | Status: DC

## 2022-12-26 NOTE — Progress Notes (Signed)
Joshua Parrish is a 47 y.o. male with the following history as recorded in EpicCare:  Patient Active Problem List   Diagnosis Date Noted   Suicidal ideations 12/19/2022   Suicidal ideation 12/18/2022   Major depressive disorder, recurrent episode, severe (HCC) 11/13/2022   Alcohol abuse 11/13/2022   Acute drug overdose, intentional self-harm, initial encounter (HCC) 11/12/2022   OD (overdose of drug), intentional self-harm, initial encounter (HCC) 11/11/2022   Insomnia disorder, with other sleep disorder, recurrent 11/11/2022   RSV bronchitis 08/14/2022   Chronic gout 06/13/2022   Abdominal pain 02/16/2022   Gait abnormality 01/09/2022   Morbid obesity with BMI of 45.0-49.9, adult (HCC) 01/09/2022   OSA on CPAP 01/09/2022   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   Low back pain 01/09/2022   Rotator cuff disorder, left 12/25/2017   Bilateral shoulder pain 05/22/2017   Left knee pain 05/22/2017   Urinary frequency 02/09/2017   Essential hypertension 01/16/2017   Prostatitis 12/25/2016   Sepsis (HCC) 12/25/2016   GERD without esophagitis 12/25/2016   Sinusitis 12/09/2016   Tendinitis of right rotator cuff 12/09/2016   Laryngopharyngeal reflux (LPR) 02/04/2016   Mass in neck 11/27/2015   Rotator cuff impingement syndrome of left shoulder 06/19/2015   Acromioclavicular joint arthritis 06/19/2015    Current Outpatient Medications  Medication Sig Dispense Refill   gabapentin (NEURONTIN) 100 MG capsule Take 1 capsule (100 mg total) by mouth 3 (three) times daily. 90 capsule 0   omeprazole (PRILOSEC) 40 MG capsule TAKE ONE CAPSULE BY MOUTH DAILY 90 capsule 3   testosterone cypionate (DEPOTESTOSTERONE CYPIONATE) 200 MG/ML injection Inject 100 mg into the muscle every Thursday.     traZODone (DESYREL) 100 MG tablet Take 1 tablet (100 mg total) by mouth at bedtime. 30 tablet 0   venlafaxine XR (EFFEXOR-XR) 75 MG 24 hr capsule Take 1 capsule (75 mg total) by mouth daily with breakfast. 30 capsule 0    valsartan-hydrochlorothiazide (DIOVAN HCT) 320-25 MG tablet Take 1 tablet by mouth daily. 90 tablet 0   No current facility-administered medications for this visit.    Allergies: Silicone, Atarax [hydroxyzine], Coconut oil, and Lisinopril  Past Medical History:  Diagnosis Date   Anxiety    Chicken pox    Depression    Fatty liver    GERD (gastroesophageal reflux disease)    H/O hernia repair 03/31/2022   Hypertension    Migraines    Osteoarthritis of shoulder    left   Rotator cuff impingement syndrome of left shoulder 06/19/2015   Sinusitis    Sleep apnea     Past Surgical History:  Procedure Laterality Date   APPENDECTOMY     CERVICAL LAMINECTOMY     has plates and fusion   CHOLECYSTECTOMY N/A 12/24/2015   Procedure: LAPAROSCOPIC CHOLECYSTECTOMY ;  Surgeon: Gaynelle Adu, MD;  Location: [facility];  Service: General;  Laterality: N/A;   CLAVICLE EXCISION Left    HYPOSPADIAS CORRECTION     as child   KNEE SURGERY Bilateral    multiple   SHOULDER ARTHROSCOPY WITH ROTATOR CUFF REPAIR Left 06/26/2015   Procedure: SHOULDER ARTHROSCOPY WITH ROTATOR CUFF REPAIR;  Surgeon: Salvatore Marvel, MD;  Location: [facility];  Service: Orthopedics;  Laterality: Left;    Family History  Problem Relation Age of Onset   Arthritis Mother    Ovarian cancer Mother    Alcohol abuse Father    Breast cancer Maternal Uncle    Breast cancer Maternal Grandmother    Cancer Maternal Grandmother  spinal surgery   Cancer Other    Hypertension Other    Diabetes Other    Breast cancer Maternal Great-grandfather    Colon cancer Neg Hx    Sleep apnea Neg Hx     Social History   Tobacco Use   Smoking status: Never   Smokeless tobacco: Current    Types: Chew   Tobacco comments:    form given 11/29/15  Substance Use Topics   Alcohol use: Yes    Alcohol/week: 0.0 standard drinks of alcohol    Comment: occasional    Subjective:   Was recently admitted to hospital for suicidal  ideation- denies any concerns today; notes had reached rock bottom last week but "I have found myself again." Was discharged yesterday; Denies any suicidal ideation today;  Had problems with swelling in lower legs while in hospital- Amlodipine was discontinued; was changed to Losartan and HCTZ;  Scheduled to establish with psychiatrist on May 7;     Objective:  Vitals:   12/26/22 0909  BP: 138/88  Pulse: 99  Resp: 19  SpO2: 99%  Weight: 289 lb 12.8 oz (131.5 kg)  Height: 5\' 8"  (1.727 m)    General: Well developed, well nourished, in no acute distress  Skin : Warm and dry.  Head: Normocephalic and atraumatic  Lungs: Respirations unlabored; clear to auscultation bilaterally without wheeze, rales, rhonchi  CVS exam: normal rate and regular rhythm.  Extremities: No edema, cyanosis, clubbing  Vessels: Symmetric bilaterally  Neurologic: Alert and oriented; speech intact; face symmetrical; moves all extremities well; CNII-XII intact without focal deficit   Assessment:  1. Essential hypertension   2. Severe episode of recurrent major depressive disorder, without psychotic features (HCC)     Plan:  Will re-start Diovan HCT 320/25; he understands not to take Losartan and HCTZ any further; follow up in 3 months; Patient denies any suicidal ideations- notes he is doing "great." Will plan to establish with psychiatrist in early May as already scheduled;   Return in about 3 months (around 03/27/2023).  No orders of the defined types were placed in this encounter.   Requested Prescriptions   Signed Prescriptions Disp Refills   valsartan-hydrochlorothiazide (DIOVAN HCT) 320-25 MG tablet 90 tablet 0    Sig: Take 1 tablet by mouth daily.

## 2022-12-31 ENCOUNTER — Other Ambulatory Visit: Payer: Self-pay | Admitting: Family

## 2022-12-31 ENCOUNTER — Encounter: Payer: Self-pay | Admitting: Family

## 2022-12-31 DIAGNOSIS — R051 Acute cough: Secondary | ICD-10-CM

## 2022-12-31 DIAGNOSIS — J069 Acute upper respiratory infection, unspecified: Secondary | ICD-10-CM

## 2022-12-31 MED ORDER — ALBUTEROL SULFATE HFA 108 (90 BASE) MCG/ACT IN AERS
2.0000 | INHALATION_SPRAY | Freq: Four times a day (QID) | RESPIRATORY_TRACT | 0 refills | Status: DC | PRN
Start: 2022-12-31 — End: 2023-01-22

## 2023-01-05 ENCOUNTER — Encounter: Payer: Self-pay | Admitting: Family

## 2023-01-05 ENCOUNTER — Encounter (HOSPITAL_BASED_OUTPATIENT_CLINIC_OR_DEPARTMENT_OTHER): Payer: Self-pay | Admitting: Emergency Medicine

## 2023-01-05 ENCOUNTER — Emergency Department (HOSPITAL_BASED_OUTPATIENT_CLINIC_OR_DEPARTMENT_OTHER): Payer: 59

## 2023-01-05 ENCOUNTER — Emergency Department (HOSPITAL_BASED_OUTPATIENT_CLINIC_OR_DEPARTMENT_OTHER)
Admission: EM | Admit: 2023-01-05 | Discharge: 2023-01-05 | Disposition: A | Discharge status: Home / Self Care | Payer: 59 | Attending: Emergency Medicine | Admitting: Emergency Medicine

## 2023-01-05 ENCOUNTER — Other Ambulatory Visit: Payer: Self-pay

## 2023-01-05 DIAGNOSIS — R000 Tachycardia, unspecified: Secondary | ICD-10-CM | POA: Diagnosis not present

## 2023-01-05 DIAGNOSIS — G43809 Other migraine, not intractable, without status migrainosus: Secondary | ICD-10-CM | POA: Diagnosis not present

## 2023-01-05 DIAGNOSIS — I10 Essential (primary) hypertension: Secondary | ICD-10-CM | POA: Diagnosis not present

## 2023-01-05 DIAGNOSIS — Z20822 Contact with and (suspected) exposure to covid-19: Secondary | ICD-10-CM | POA: Diagnosis not present

## 2023-01-05 DIAGNOSIS — R519 Headache, unspecified: Secondary | ICD-10-CM | POA: Diagnosis present

## 2023-01-05 DIAGNOSIS — R079 Chest pain, unspecified: Secondary | ICD-10-CM | POA: Insufficient documentation

## 2023-01-05 DIAGNOSIS — Z79899 Other long term (current) drug therapy: Secondary | ICD-10-CM | POA: Diagnosis not present

## 2023-01-05 DIAGNOSIS — E876 Hypokalemia: Secondary | ICD-10-CM | POA: Diagnosis not present

## 2023-01-05 LAB — CBC WITH DIFFERENTIAL/PLATELET
Abs Immature Granulocytes: 0.04 10*3/uL (ref 0.00–0.07)
Basophils Absolute: 0 10*3/uL (ref 0.0–0.1)
Basophils Relative: 0 %
Eosinophils Absolute: 0.1 10*3/uL (ref 0.0–0.5)
Eosinophils Relative: 1 %
HCT: 44.4 % (ref 39.0–52.0)
Hemoglobin: 14.6 g/dL (ref 13.0–17.0)
Immature Granulocytes: 0 %
Lymphocytes Relative: 23 %
Lymphs Abs: 2.9 10*3/uL (ref 0.7–4.0)
MCH: 29 pg (ref 26.0–34.0)
MCHC: 32.9 g/dL (ref 30.0–36.0)
MCV: 88.3 fL (ref 80.0–100.0)
Monocytes Absolute: 0.7 10*3/uL (ref 0.1–1.0)
Monocytes Relative: 6 %
Neutro Abs: 8.8 10*3/uL — ABNORMAL HIGH (ref 1.7–7.7)
Neutrophils Relative %: 70 %
Platelets: 361 10*3/uL (ref 150–400)
RBC: 5.03 MIL/uL (ref 4.22–5.81)
RDW: 13.3 % (ref 11.5–15.5)
WBC: 12.6 10*3/uL — ABNORMAL HIGH (ref 4.0–10.5)
nRBC: 0 % (ref 0.0–0.2)

## 2023-01-05 LAB — COMPREHENSIVE METABOLIC PANEL
ALT: 30 U/L (ref 0–44)
AST: 29 U/L (ref 15–41)
Albumin: 4.5 g/dL (ref 3.5–5.0)
Alkaline Phosphatase: 68 U/L (ref 38–126)
Anion gap: 13 (ref 5–15)
BUN: 11 mg/dL (ref 6–20)
CO2: 27 mmol/L (ref 22–32)
Calcium: 9.4 mg/dL (ref 8.9–10.3)
Chloride: 98 mmol/L (ref 98–111)
Creatinine, Ser: 1.04 mg/dL (ref 0.61–1.24)
GFR, Estimated: >60 mL/min (ref >60)
Glucose, Bld: 99 mg/dL (ref 70–99)
Potassium: 3.2 mmol/L — ABNORMAL LOW (ref 3.5–5.1)
Sodium: 138 mmol/L (ref 135–145)
Total Bilirubin: 0.5 mg/dL (ref 0.3–1.2)
Total Protein: 7.3 g/dL (ref 6.5–8.1)

## 2023-01-05 LAB — TROPONIN I (HIGH SENSITIVITY)
Troponin I (High Sensitivity): 7 ng/L (ref <18)
Troponin I (High Sensitivity): 8 ng/L (ref <18)

## 2023-01-05 LAB — SARS CORONAVIRUS 2 BY RT PCR: SARS Coronavirus 2 by RT PCR: NEGATIVE

## 2023-01-05 MED ORDER — SODIUM CHLORIDE 0.9 % IV BOLUS
1000.0000 mL | Freq: Once | INTRAVENOUS | Status: AC
  Administered 2023-01-05: 1000 mL via INTRAVENOUS

## 2023-01-05 MED ORDER — KETOROLAC TROMETHAMINE 15 MG/ML IJ SOLN
15.0000 mg | Freq: Once | INTRAMUSCULAR | Status: AC
  Administered 2023-01-05: 15 mg via INTRAVENOUS
  Filled 2023-01-05: qty 1

## 2023-01-05 MED ORDER — PROCHLORPERAZINE MALEATE 10 MG PO TABS
10.0000 mg | ORAL_TABLET | Freq: Once | ORAL | Status: AC
  Administered 2023-01-05: 10 mg via ORAL
  Filled 2023-01-05: qty 1

## 2023-01-05 MED ORDER — POTASSIUM CHLORIDE CRYS ER 20 MEQ PO TBCR
40.0000 meq | EXTENDED_RELEASE_TABLET | Freq: Once | ORAL | Status: AC
  Administered 2023-01-05: 40 meq via ORAL
  Filled 2023-01-05: qty 2

## 2023-01-05 MED ORDER — ONDANSETRON HCL 4 MG/2ML IJ SOLN
4.0000 mg | Freq: Once | INTRAMUSCULAR | Status: AC
  Administered 2023-01-05: 4 mg via INTRAVENOUS
  Filled 2023-01-05: qty 2

## 2023-01-05 NOTE — Discharge Instructions (Signed)
Please follow-up with your primary care provider regarding recent symptoms and ER visit.  As we discussed today you are given Compazine and Toradol for your headache and decided to forego the droperidol.  Your labs and imaging were all reassuring along with your physical exam.  If symptoms worsen please return to ER.

## 2023-01-05 NOTE — ED Provider Notes (Addendum)
[facility] AT [facility] Provider Note   CSN: [id number] Arrival date & time: 01/05/23  1706     History  Chief Complaint  Patient presents with   Headache   Dizziness    Joshua Parrish is a 47 y.o. male history of hypertension, DVT, SI presented with 1 week of bilateral headache with left-sided chest pain and left arm pain.  Patient states he has a history of migraines and that this feels different.  Patient states he feels unsteady when he has his headaches and when he gets his headaches he gets sharp pains in his left chest that is reproducible with palpation.  Patient states that the pain then radiates down his left arm but denies any new onset weakness.  Patient states he has tried ibuprofen Tylenol to no relief patient he thinks that the headaches are related to stress.  Patient does endorse some nausea and photophobia.  Patient denied chest pain, abdominal pain, emesis, dysuria, fevers, neck pain, vision changes  Home Medications Prior to Admission medications   Medication Sig Start Date End Date Taking? Authorizing Provider  albuterol (VENTOLIN HFA) 108 (90 Base) MCG/ACT inhaler Inhale 2 puffs into the lungs every 6 (six) hours as needed for wheezing or shortness of breath. 12/31/22   Olive Bass, FNP  gabapentin (NEURONTIN) 100 MG capsule Take 1 capsule (100 mg total) by mouth 3 (three) times daily. 12/25/22 01/24/23  Massengill, Harrold Donath, MD  omeprazole (PRILOSEC) 40 MG capsule TAKE ONE CAPSULE BY MOUTH DAILY 09/29/22   Olive Bass, FNP  testosterone cypionate (DEPOTESTOSTERONE CYPIONATE) 200 MG/ML injection Inject 100 mg into the muscle every Thursday. 03/07/22   [provider]  traZODone (DESYREL) 100 MG tablet Take 1 tablet (100 mg total) by mouth at bedtime. 12/25/22 01/24/23  Massengill, Harrold Donath, MD  valsartan-hydrochlorothiazide (DIOVAN HCT) 320-25 MG tablet Take 1 tablet by mouth daily. 12/26/22   Olive Bass, FNP   venlafaxine XR (EFFEXOR-XR) 75 MG 24 hr capsule Take 1 capsule (75 mg total) by mouth daily with breakfast. 12/25/22 01/24/23  Massengill, Harrold Donath, MD      Allergies    Silicone, Atarax [hydroxyzine], Coconut oil, and Lisinopril    Review of Systems   Review of Systems  Neurological:  Positive for dizziness and headaches.    Physical Exam Updated Vital Signs BP (!) 142/95 (BP Location: Right Arm)   Pulse 86   Temp 98.5 F (36.9 C) (Temporal)   Resp 18   Ht 5\' 8"  (1.727 m)   Wt 124.7 kg   SpO2 96%   BMI 41.81 kg/m  Physical Exam Constitutional:      General: He is not in acute distress. Eyes:     Extraocular Movements: Extraocular movements intact.     Conjunctiva/sclera: Conjunctivae normal.     Pupils: Pupils are equal, round, and reactive to light.     Comments: No photophobia  Cardiovascular:     Rate and Rhythm: Regular rhythm. Tachycardia present.     Pulses: Normal pulses.     Heart sounds: Normal heart sounds.  Pulmonary:     Effort: Pulmonary effort is normal. No respiratory distress.     Breath sounds: Normal breath sounds.  Abdominal:     Palpations: Abdomen is soft.     Tenderness: There is no abdominal tenderness. There is no guarding or rebound.  Musculoskeletal:        General: Normal range of motion.     Cervical back: Normal range  of motion.     Comments: Chest pain reproducible with palpation on exam  Skin:    General: Skin is warm and dry.     Capillary Refill: Capillary refill takes less than 2 seconds.  Neurological:     Mental Status: He is alert and oriented to person, place, and time.     Sensory: Sensation is intact.     Motor: Motor function is intact.     Coordination: Coordination is intact.     Gait: Gait is intact.     Comments: CN III-XII intact Vision grossly intact     ED Results / Procedures / Treatments   Labs (all labs ordered are listed, but only abnormal results are displayed) Labs Reviewed  CBC WITH  DIFFERENTIAL/PLATELET - Abnormal; Notable for the following components:      Result Value   WBC 12.6 (*)    Neutro Abs 8.8 (*)    All other components within normal limits  COMPREHENSIVE METABOLIC PANEL - Abnormal; Notable for the following components:   Potassium 3.2 (*)    All other components within normal limits  SARS CORONAVIRUS 2 BY RT PCR  TROPONIN I (HIGH SENSITIVITY)  TROPONIN I (HIGH SENSITIVITY)    EKG EKG Interpretation  Date/Time:  Monday Jan 05 2023 17:20:10 EDT Ventricular Rate:  109 PR Interval:  138 QRS Duration: 92 QT Interval:  336 QTC Calculation: 452 R Axis:   66 Text Interpretation: Sinus tachycardia T wave abnormality, consider inferior ischemia Abnormal ECG When compared with ECG of 17-Dec-2022 18:18, No significant change since last tracing Confirmed by Linwood Dibbles [phone]) on 01/05/2023 5:24:37 PM  Radiology DG Chest 1 View  Result Date: 01/05/2023 CLINICAL DATA:  Chest pain on the right EXAM: CHEST  1 VIEW COMPARISON:  Chest x-ray 12/17/2022 FINDINGS: The heart size and mediastinal contours are within normal limits. Both lungs are clear. Lung volumes are low. Cervical spinal fusion plate is present. IMPRESSION: Low lung volumes. No active disease. Electronically Signed   By: Darliss Cheney M.D.   On: 01/05/2023 18:49   CT Head Wo Contrast  Result Date: 01/05/2023 CLINICAL DATA:  Headache, increasing frequency or severity Headache with left arm pain EXAM: CT HEAD WITHOUT CONTRAST TECHNIQUE: Contiguous axial images were obtained from the base of the skull through the vertex without intravenous contrast. RADIATION DOSE REDUCTION: This exam was performed according to the departmental dose-optimization program which includes automated exposure control, adjustment of the mA and/or kV according to patient size and/or use of iterative reconstruction technique. COMPARISON:  Head CT 12/19/2022 FINDINGS: Brain: No evidence of acute infarction, hemorrhage, hydrocephalus,  extra-axial collection or mass lesion/mass effect. Vascular: No hyperdense vessel or unexpected calcification. Skull: No acute findings. Sinuses/Orbits: Stable partial opacification of lower mastoid air cells, right greater than left no acute findings. Other: IMPRESSION: No acute intracranial abnormality.  Stable head CT. Electronically Signed   By: Narda Rutherford M.D.   On: 01/05/2023 18:43    Procedures Procedures    Medications Ordered in ED Medications  sodium chloride 0.9 % bolus 1,000 mL (0 mLs Intravenous Stopped 01/05/23 1917)  ondansetron (ZOFRAN) injection 4 mg (4 mg Intravenous Given 01/05/23 1801)  potassium chloride SA (KLOR-CON M) CR tablet 40 mEq (40 mEq Oral Given 01/05/23 1840)  prochlorperazine (COMPAZINE) tablet 10 mg (10 mg Oral Given 01/05/23 1842)  ketorolac (TORADOL) 15 MG/ML injection 15 mg (15 mg Intravenous Given 01/05/23 1914)    ED Course/ Medical Decision Making/ A&P  Medical Decision Making Amount and/or Complexity of Data Reviewed Labs: ordered. Radiology: ordered.  Risk Prescription drug management.   Joshua Parrish 47 y.o. presented today for headache, chest pain. Working DDx that I considered at this time includes, but not limited to, tension headache, migraine intracranial mass, intracranial hemorrhage, intracranial infection including meningitis vs encephalitis, GCA, trigeminal neuralgia, ACS, costochondritis, GERD.  R/o DDx: ACS, GERD, PE, tension pneumothorax, esophageal rupture, aortic dissection:  less likely due to history of present illness and physical exam findings Migraine: These are considered less likely due to history of present illness and physical exam findings SAH/ICH: Timeline and slow onset is not consistent with SAH/ICH  GCA: Age and description of pain is not consistent with GCA  Meningitis/encephalitis: Lack of fever,meningismus is not consistent Intracranial Mass:  less likely due to history of present illness  and physical exam findings Trigeminal Neuralgia: headache does not meet this description Intracranial Infection:  less likely due to history of present illness and physical exam findings, no fevers  Review of prior external notes: 12/25/2022 discharge summary  Unique Tests and My Interpretation:  CBC: Unremarkable Troponin: 7, 8 CMP: Hypokalemia 3.2 COVID: Negative EKG: Sinus tachycardia 109 bpm, no signs of ST elevation or blocks CT Head w/o Contrast: No acute intracranial abnormalities Chest x-ray: No acute cardiopulmonary changes  Discussion with Independent Historian: None  Discussion of Management of Tests: None  Risk: Medium: prescription drug management  Risk Stratification Score: none  Staffed with Lynelle Doctor, MD  Plan: Patient presented for HA, chest pain. On exam patient was in no acute distress however was tachycardic to 112 and tachypneic at 22. Physical exam was remarkable for reproducing chest pain with palpation however the rest of his exam was unremarkable. Labs and imaging will be ordered. Patient will be given Compazine for his headache as he has tried Tylenol and ibuprofen already. Patient stable at this time.  Patient's labs showed hypokalemia 3.2 and so patient will be given potassium pill here.  Patient's head CT was negative and so patient will be given Toradol as he is still endorsing a throbbing bilateral head pain.  Patient stable at this time.  Patient was able to tolerate the potassium orally.  On recheck patient stated that the medication had begun to help with his headache slightly and after shared decision making patient stated that he wanted be discharged versus receiving droperidol for his headache.  Encouraged patient to follow-up with his primary care provider regarding recent symptoms and ER visit.  Encouraged patient to take Tylenol or ibuprofen every 6 hours needed for the headache and to remain hydrated.  Patient's labs and imaging along with physical  exam are reassuring and patient will be discharged.  Patient was given return precautions. Patient stable for discharge at this time.  Patient verbalized understanding of plan.         Final Clinical Impression(s) / ED Diagnoses Final diagnoses:  Other migraine without status migrainosus, not intractable    Rx / DC Orders ED Discharge Orders     None         Remi Deter 01/05/23 2034    Linwood Dibbles, MD 01/05/23 2324

## 2023-01-05 NOTE — ED Notes (Signed)
PA at the Bedside. ?

## 2023-01-05 NOTE — ED Triage Notes (Signed)
Pt arrives to ED with c/o headache, dizziness, chest pressure, and left arm tingling x1 week.

## 2023-01-06 ENCOUNTER — Telehealth (HOSPITAL_COMMUNITY): Payer: 59 | Admitting: Psychiatry

## 2023-01-08 ENCOUNTER — Encounter: Payer: Self-pay | Admitting: Family

## 2023-01-08 ENCOUNTER — Ambulatory Visit (INDEPENDENT_AMBULATORY_CARE_PROVIDER_SITE_OTHER): Payer: 59 | Admitting: Family

## 2023-01-08 VITALS — BP 138/80 | HR 94 | Ht 68.0 in | Wt 289.8 lb

## 2023-01-08 DIAGNOSIS — R300 Dysuria: Secondary | ICD-10-CM

## 2023-01-08 DIAGNOSIS — E876 Hypokalemia: Secondary | ICD-10-CM

## 2023-01-08 DIAGNOSIS — R519 Headache, unspecified: Secondary | ICD-10-CM

## 2023-01-08 DIAGNOSIS — D72829 Elevated white blood cell count, unspecified: Secondary | ICD-10-CM

## 2023-01-08 LAB — BASIC METABOLIC PANEL
BUN: 9 mg/dL (ref 6–23)
CO2: 31 mEq/L (ref 19–32)
Calcium: 9.6 mg/dL (ref 8.4–10.5)
Chloride: 100 mEq/L (ref 96–112)
Creatinine, Ser: 0.94 mg/dL (ref 0.40–1.50)
GFR: 96.41 mL/min (ref >60.00)
Glucose, Bld: 99 mg/dL (ref 70–99)
Potassium: 4.1 mEq/L (ref 3.5–5.1)
Sodium: 141 mEq/L (ref 135–145)

## 2023-01-08 LAB — CBC WITH DIFFERENTIAL/PLATELET
Basophils Absolute: 0 10*3/uL (ref 0.0–0.1)
Basophils Relative: 0.4 % (ref 0.0–3.0)
Eosinophils Absolute: 0 10*3/uL (ref 0.0–0.7)
Eosinophils Relative: 0.5 % (ref 0.0–5.0)
HCT: 43.2 % (ref 39.0–52.0)
Hemoglobin: 14.5 g/dL (ref 13.0–17.0)
Lymphocytes Relative: 24.8 % (ref 12.0–46.0)
Lymphs Abs: 2 10*3/uL (ref 0.7–4.0)
MCHC: 33.5 g/dL (ref 30.0–36.0)
MCV: 87.4 fl (ref 78.0–100.0)
Monocytes Absolute: 0.5 10*3/uL (ref 0.1–1.0)
Monocytes Relative: 5.6 % (ref 3.0–12.0)
Neutro Abs: 5.6 10*3/uL (ref 1.4–7.7)
Neutrophils Relative %: 68.7 % (ref 43.0–77.0)
Platelets: 361 10*3/uL (ref 150.0–400.0)
RBC: 4.95 Mil/uL (ref 4.22–5.81)
RDW: 14.3 % (ref 11.5–15.5)
WBC: 8.2 10*3/uL (ref 4.0–10.5)

## 2023-01-08 MED ORDER — KETOROLAC TROMETHAMINE 30 MG/ML IJ SOLN
30.0000 mg | Freq: Once | INTRAMUSCULAR | Status: AC
Start: 2023-01-08 — End: 2023-01-08
  Administered 2023-01-08: 30 mg via INTRAMUSCULAR

## 2023-01-08 MED ORDER — AMLODIPINE BESYLATE 10 MG PO TABS: 10.0000 mg | ORAL_TABLET | Freq: Every day | ORAL | 3 refills | Status: DC

## 2023-01-08 NOTE — Progress Notes (Signed)
Joshua Parrish is a 46 y.o. male with the following history as recorded in [employer]:  Patient Active Problem List   Diagnosis Date Noted   Suicidal ideations 12/19/2022   Suicidal ideation 12/18/2022   Major depressive disorder, recurrent episode, severe (HCC) 11/13/2022   Alcohol abuse 11/13/2022   Acute drug overdose, intentional self-harm, initial encounter (HCC) 11/12/2022   OD (overdose of drug), intentional self-harm, initial encounter (HCC) 11/11/2022   Insomnia disorder, with other sleep disorder, recurrent 11/11/2022   RSV bronchitis 08/14/2022   Chronic gout 06/13/2022   Abdominal pain 02/16/2022   Gait abnormality 01/09/2022   Morbid obesity with BMI of 45.0-49.9, adult (HCC) 01/09/2022   OSA on CPAP 01/09/2022   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   Low back pain 01/09/2022   Rotator cuff disorder, left 12/25/2017   Bilateral shoulder pain 05/22/2017   Left knee pain 05/22/2017   Urinary frequency 02/09/2017   Essential hypertension 01/16/2017   Prostatitis 12/25/2016   Sepsis (HCC) 12/25/2016   GERD without esophagitis 12/25/2016   Sinusitis 12/09/2016   Tendinitis of right rotator cuff 12/09/2016   Laryngopharyngeal reflux (LPR) 02/04/2016   Mass in neck 11/27/2015   Rotator cuff impingement syndrome of left shoulder 06/19/2015   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 8 g 0   gabapentin (NEURONTIN) 100 MG capsule Take 1 capsule (100 mg total) by mouth 3 (three) times daily. 90 capsule 0   omeprazole (PRILOSEC) 40 MG capsule TAKE ONE CAPSULE BY MOUTH DAILY 90 capsule 3   testosterone cypionate (DEPOTESTOSTERONE CYPIONATE) 200 MG/ML injection Inject 100 mg into the muscle every Thursday.     traZODone (DESYREL) 100 MG tablet Take 1 tablet (100 mg total) by  mouth at bedtime. 30 tablet 0   valsartan-hydrochlorothiazide (DIOVAN HCT) 320-25 MG tablet Take 1 tablet by mouth daily. 90 tablet 0   venlafaxine XR (EFFEXOR-XR) 75 MG 24 hr capsule Take 1 capsule (75 mg total) by mouth daily with breakfast. 30 capsule 0   amLODipine (NORVASC) 10 MG tablet Take 1 tablet (10 mg total) by mouth daily. 30 tablet 3   No current facility-administered medications for this visit.    Allergies: Silicone, Atarax [hydroxyzine], Coconut oil, and Lisinopril  Past Medical History:  Diagnosis Date   Anxiety    Chicken pox    Depression    Fatty liver    GERD (gastroesophageal reflux disease)    H/O hernia repair 03/31/2022   Hypertension    Migraines    Osteoarthritis of shoulder    left   Rotator cuff impingement syndrome of left shoulder 06/19/2015   Sinusitis    Sleep apnea     Past Surgical History:  Procedure Laterality Date   APPENDECTOMY     CERVICAL LAMINECTOMY     has plates and fusion   CHOLECYSTECTOMY N/A 12/24/2015   Procedure: LAPAROSCOPIC CHOLECYSTECTOMY ;  Surgeon: Gaynelle Adu, MD;  Location: [facility];  Service: General;  Laterality: N/A;   CLAVICLE EXCISION Left    HYPOSPADIAS CORRECTION     as child   KNEE SURGERY Bilateral    multiple   SHOULDER ARTHROSCOPY WITH ROTATOR CUFF REPAIR Left 06/26/2015   Procedure: SHOULDER ARTHROSCOPY WITH ROTATOR CUFF REPAIR;  Surgeon: Salvatore Marvel, MD;  Location: [facility];  Service: Orthopedics;  Laterality: Left;    Family History  Problem Relation Age of Onset   Arthritis Mother    Ovarian cancer Mother    Alcohol abuse Father    Breast cancer Maternal Uncle    Breast cancer Maternal Grandmother    Cancer Maternal Grandmother        spinal surgery   Cancer Other    Hypertension Other    Diabetes Other    Breast cancer Maternal Great-grandfather    Colon cancer Neg Hx    Sleep apnea Neg Hx     Social History   Tobacco Use   Smoking status: Never   Smokeless tobacco:  Current    Types: Chew   Tobacco comments:    form given 11/29/15  Substance Use Topics   Alcohol use: Yes    Alcohol/week: 0.0 standard drinks of alcohol    Comment: occasional    Subjective:   Patient was seen at ER on Monday of this week with headache- suspected migraine; cardiac source was ruled out; patient started himself back on his Amlodipine yesterday because he was concerned that blood pressure was the source; still taking Diovan HCT 320/25 mg;  Did get Toradol injection at ER with some relief; feels that headache is returning this morning; normal head CT was done at ER;       Objective:  Vitals:   01/08/23 0805 01/08/23 0843  BP: (!) 142/86 138/80  Pulse: 94   SpO2: 98%   Weight: 289 lb 12.8 oz (131.5 kg)   Height: 5\' 8"  (1.727 m)     General: Well developed, well nourished, in no acute distress  Skin : Warm and dry.  Head: Normocephalic and atraumatic  Eyes: Sclera and conjunctiva clear; pupils round and reactive to light; extraocular movements intact  Ears: External normal; canals clear; tympanic membranes normal  Oropharynx: Pink, supple. No suspicious lesions  Neck: Supple without thyromegaly, adenopathy  Lungs: Respirations unlabored; clear to auscultation bilaterally without wheeze, rales, rhonchi  CVS exam: normal rate and regular rhythm.  Neurologic: Alert and oriented; speech intact; face symmetrical; moves all extremities well; CNII-XII intact without focal deficit  Assessment:  1. Dysuria   2. Hypokalemia   3. Nonintractable headache, unspecified chronicity pattern, unspecified headache type   4. Leukocytosis, unspecified type     Plan:  Due to persistent elevated WBC and headache, check urine culture due to patient's history of UTI; Repeat BMP due to recent hypokalemia; Toradol IM 30 mg given in office- suspect migraine component due to stress; agree with his decision to re-start Amlodipine; he will call back with readings early next week;  He also  needs to reach back out to psychiatrist to get re-scheduled; follow up to be determined;   No follow-ups on file.  Orders Placed This Encounter  Procedures   Urine Culture   Basic Metabolic Panel (BMET)   CBC with Differential/Platelet    Requested Prescriptions   Signed Prescriptions Disp Refills   amLODipine (NORVASC) 10 MG tablet 30 tablet 3    Sig: Take 1 tablet (10 mg total) by mouth daily.

## 2023-01-08 NOTE — Patient Instructions (Signed)
Stay on the Diovan HCT and Amlodipine; let us hear from you early next week with your blood pressure readings;

## 2023-01-09 LAB — URINE CULTURE
MICRO NUMBER:: 14934976
Result:: NO GROWTH
SPECIMEN QUALITY:: ADEQUATE

## 2023-01-20 ENCOUNTER — Other Ambulatory Visit: Payer: Self-pay | Admitting: Family

## 2023-01-22 ENCOUNTER — Other Ambulatory Visit: Payer: Self-pay | Admitting: Family

## 2023-01-22 ENCOUNTER — Encounter: Payer: Self-pay | Admitting: Family

## 2023-01-22 DIAGNOSIS — R051 Acute cough: Secondary | ICD-10-CM

## 2023-01-22 DIAGNOSIS — F332 Major depressive disorder, recurrent severe without psychotic features: Secondary | ICD-10-CM

## 2023-01-22 DIAGNOSIS — J069 Acute upper respiratory infection, unspecified: Secondary | ICD-10-CM

## 2023-01-22 DIAGNOSIS — R45851 Suicidal ideations: Secondary | ICD-10-CM

## 2023-01-22 MED ORDER — TRAZODONE HCL 100 MG PO TABS: 100.0000 mg | ORAL_TABLET | Freq: Every day | ORAL | 0 refills | Status: DC

## 2023-01-22 MED ORDER — GABAPENTIN 100 MG PO CAPS: 100.0000 mg | ORAL_CAPSULE | Freq: Three times a day (TID) | ORAL | 0 refills | Status: DC

## 2023-01-22 MED ORDER — VENLAFAXINE HCL ER 75 MG PO CP24: 75.0000 mg | ORAL_CAPSULE | Freq: Every day | ORAL | 0 refills | Status: DC

## 2023-01-23 MED ORDER — ALBUTEROL SULFATE HFA 108 (90 BASE) MCG/ACT IN AERS
2.0000 | INHALATION_SPRAY | Freq: Four times a day (QID) | RESPIRATORY_TRACT | 5 refills | Status: DC | PRN
Start: 2023-01-23 — End: 2023-11-06

## 2023-02-02 ENCOUNTER — Ambulatory Visit
Admission: EM | Admit: 2023-02-02 | Discharge: 2023-02-02 | Disposition: A | Discharge status: Home / Self Care | Payer: 59

## 2023-02-02 ENCOUNTER — Telehealth: Payer: 59 | Admitting: Physician Assistant

## 2023-02-02 DIAGNOSIS — M5136 Other intervertebral disc degeneration, lumbar region: Secondary | ICD-10-CM

## 2023-02-02 DIAGNOSIS — S39012A Strain of muscle, fascia and tendon of lower back, initial encounter: Secondary | ICD-10-CM | POA: Diagnosis not present

## 2023-02-02 DIAGNOSIS — S76211A Strain of adductor muscle, fascia and tendon of right thigh, initial encounter: Secondary | ICD-10-CM

## 2023-02-02 DIAGNOSIS — M48061 Spinal stenosis, lumbar region without neurogenic claudication: Secondary | ICD-10-CM | POA: Diagnosis not present

## 2023-02-02 DIAGNOSIS — R1031 Right lower quadrant pain: Secondary | ICD-10-CM

## 2023-02-02 DIAGNOSIS — S3992XA Unspecified injury of lower back, initial encounter: Secondary | ICD-10-CM

## 2023-02-02 MED ORDER — PREDNISONE 50 MG PO TABS: 50.0000 mg | ORAL_TABLET | Freq: Every day | ORAL | 0 refills | Status: DC

## 2023-02-02 MED ORDER — KETOROLAC TROMETHAMINE 60 MG/2ML IM SOLN
60.0000 mg | Freq: Once | INTRAMUSCULAR | Status: AC
  Administered 2023-02-02: 60 mg via INTRAMUSCULAR

## 2023-02-02 MED ORDER — CYCLOBENZAPRINE HCL 5 MG PO TABS: 5.0000 mg | ORAL_TABLET | Freq: Three times a day (TID) | ORAL | 0 refills | Status: DC | PRN

## 2023-02-02 NOTE — ED Triage Notes (Signed)
Pt reports groin pain and low back pain x 2 days after moving furniture. Pain is worse with activity. Advil gives some relief.

## 2023-02-02 NOTE — ED Provider Notes (Signed)
[facility]  Note:  This document was prepared using Conservation officer, historic buildings and may include unintentional dictation errors.  MRN: [id number] DOB: 06-01-1976  Subjective:   Joshua Parrish is a 47 y.o. male presenting for 2-day history of acute onset persistent moderate to severe low back pain with slight tingling of the left leg, right groin pain.  Symptoms started after he spent a significant amount of time moving.  Patient moved all of his belongings by himself.  He also does a lot of strenuous work where he has to do heavy lifting and constantly climbing ladders.  No specific fall or trauma.  He does have a history of foraminal stenosis of the lumbar region, bulging disc and degenerative disc disease.  Has a history of a right groin hernia that was repaired surgically.  Cannot appreciate any particular swelling.  No change to bowel or urinary habits.  No current facility-administered medications for this encounter.  Current Outpatient Medications:    tadalafil (CIALIS) 20 MG tablet, Take 20 mg by mouth daily as needed., Disp: , Rfl:    albuterol (VENTOLIN HFA) 108 (90 Base) MCG/ACT inhaler, Inhale 2 puffs into the lungs every 6 (six) hours as needed for wheezing or shortness of breath., Disp: 18 g, Rfl: 5   amLODipine (NORVASC) 10 MG tablet, TAKE 1 TABLET BY MOUTH DAILY, Disp: 30 tablet, Rfl: 3   gabapentin (NEURONTIN) 100 MG capsule, Take 1 capsule (100 mg total) by mouth 3 (three) times daily., Disp: 90 capsule, Rfl: 0   omeprazole (PRILOSEC) 40 MG capsule, TAKE ONE CAPSULE BY MOUTH DAILY, Disp: 90 capsule, Rfl: 3   testosterone cypionate (DEPOTESTOSTERONE CYPIONATE) 200 MG/ML injection, Inject 100 mg into the muscle every Thursday., Disp: , Rfl:    traZODone (DESYREL) 100 MG tablet, Take 1 tablet (100 mg total) by mouth at bedtime., Disp: 30 tablet, Rfl: 0   valsartan-hydrochlorothiazide (DIOVAN HCT) 320-25 MG tablet, Take 1 tablet by mouth daily., Disp: 90  tablet, Rfl: 0   venlafaxine XR (EFFEXOR-XR) 75 MG 24 hr capsule, Take 1 capsule (75 mg total) by mouth daily with breakfast., Disp: 30 capsule, Rfl: 0   Allergies  Allergen Reactions   Silicone Rash    Dermabond   Atarax [Hydroxyzine]     Patient notes made anxiety worse-   Coconut Oil Diarrhea, Nausea And Vomiting and Other (See Comments)   Lisinopril Cough    Past Medical History:  Diagnosis Date   Anxiety    Chicken pox    Depression    Fatty liver    GERD (gastroesophageal reflux disease)    H/O hernia repair 03/31/2022   Hypertension    Migraines    Osteoarthritis of shoulder    left   Rotator cuff impingement syndrome of left shoulder 06/19/2015   Sinusitis    Sleep apnea      Past Surgical History:  Procedure Laterality Date   APPENDECTOMY     CERVICAL LAMINECTOMY     has plates and fusion   CHOLECYSTECTOMY N/A 12/24/2015   Procedure: LAPAROSCOPIC CHOLECYSTECTOMY ;  Surgeon: Gaynelle Adu, MD;  Location: [facility];  Service: General;  Laterality: N/A;   CLAVICLE EXCISION Left    HYPOSPADIAS CORRECTION     as child   KNEE SURGERY Bilateral    multiple   SHOULDER ARTHROSCOPY WITH ROTATOR CUFF REPAIR Left 06/26/2015   Procedure: SHOULDER ARTHROSCOPY WITH ROTATOR CUFF REPAIR;  Surgeon: Salvatore Marvel, MD;  Location: [facility];  Service: Orthopedics;  Laterality: Left;    Family History  Problem Relation Age of Onset   Arthritis Mother    Ovarian cancer Mother    Alcohol abuse Father    Breast cancer Maternal Uncle    Breast cancer Maternal Grandmother    Cancer Maternal Grandmother        spinal surgery   Cancer Other    Hypertension Other    Diabetes Other    Breast cancer Maternal Great-grandfather    Colon cancer Neg Hx    Sleep apnea Neg Hx     Social History   Tobacco Use   Smoking status: Never   Smokeless tobacco: Current    Types: Chew   Tobacco comments:    form given 11/29/15  Vaping Use   Vaping Use: Never used  Substance  Use Topics   Alcohol use: Yes    Alcohol/week: 0.0 standard drinks of alcohol    Comment: occasional   Drug use: No    ROS   Objective:   Vitals: BP 135/78 (BP Location: Right Arm)   Pulse 95   Temp 99 F (37.2 C) (Oral)   Resp 20   SpO2 95%   Physical Exam Constitutional:      General: He is not in acute distress.    Appearance: Normal appearance. He is well-developed and normal weight. He is not ill-appearing, toxic-appearing or diaphoretic.  HENT:     Head: Normocephalic and atraumatic.     Right Ear: External ear normal.     Left Ear: External ear normal.     Nose: Nose normal.     Mouth/Throat:     Pharynx: Oropharynx is clear.  Eyes:     General: No scleral icterus.       Right eye: No discharge.        Left eye: No discharge.     Extraocular Movements: Extraocular movements intact.  Cardiovascular:     Rate and Rhythm: Normal rate.  Pulmonary:     Effort: Pulmonary effort is normal.  Abdominal:     Hernia: No hernia is present. There is no hernia in the left inguinal area, right femoral area, left femoral area or right inguinal area.  Musculoskeletal:     Cervical back: Normal range of motion.     Lumbar back: Spasms and tenderness present. No swelling, edema, deformity, signs of trauma, lacerations or bony tenderness. Decreased range of motion. Positive right straight leg raise test and positive left straight leg raise test. No scoliosis.  Neurological:     Mental Status: He is alert and oriented to person, place, and time.  Psychiatric:        Mood and Affect: Mood normal.        Behavior: Behavior normal.        Thought Content: Thought content normal.        Judgment: Judgment normal.    IM Toradol 60 mg in clinic.  Assessment and Plan :   PDMP not reviewed this encounter.  1. Lumbar strain, initial encounter   2. Inguinal strain, right, initial encounter   3. Foraminal stenosis of lumbar region   4. Lumbar degenerative disc disease    No  appreciable hernia on exam.  Recommended follow-up with his general surgeon for recheck.  Started oral prednisone course tomorrow as he has aggravated his chronic conditions for his back from straining his lumbar region and inguinal area.  Recommended close follow-up with his orthopedist.  Counseled patient on  potential for adverse effects with medications prescribed/recommended today, ER and return-to-clinic precautions discussed, patient verbalized understanding.    Wallis Bamberg, PA-C 02/02/23 1728

## 2023-02-02 NOTE — Progress Notes (Signed)
Because of severity of pain and nerve involvement, as well as concern for hernia that needs examination, I feel your condition warrants further evaluation and I recommend that you be seen in a face to face visit.   NOTE: There will be NO CHARGE for this eVisit   If you are having a true medical emergency please call 911.      For an urgent face to face visit, [place] has eight urgent care centers for your convenience:   NEW!! [facility] at [facility] Get Driving Directions [phone] [address] [place], [address]    [facility] Health Urgent Care Center at [facility] Get Driving Directions [phone] [address] [place], [place] [address]   [facility] Health Urgent Care Center [facility]) Get Driving Directions [phone] [address] [place], [place] [address]  [facility] Urgent Care Center [facility]) Get Driving Directions [phone] [address] [place],  [place]  [address]  [facility] Center [facility] - at [employer]  [phone] [address] [place],  [place] [address]   [facility] Health Urgent Care at [facility] Get Driving Directions [phone] [address] [place], [place] [address]   [facility] Health Urgent Care at [facility] Get Driving Directions  [phone] [address] [place], [place] [address]   [facility] Health Urgent Care at [facility] Directions [phone] [address], [address] [place], [place] [address]  Your MyChart E-visit questionnaire answers were reviewed by a board certified advanced clinical practitioner to complete your personal care plan based on your specific symptoms.  Thank you for using e-Visits.

## 2023-02-17 ENCOUNTER — Encounter: Payer: Self-pay | Admitting: Family

## 2023-02-17 ENCOUNTER — Other Ambulatory Visit: Payer: Self-pay | Admitting: Family

## 2023-02-17 NOTE — Telephone Encounter (Signed)
See my chart message

## 2023-03-16 ENCOUNTER — Other Ambulatory Visit: Payer: Self-pay | Admitting: Family

## 2023-03-16 NOTE — Telephone Encounter (Signed)
Was looking back at pts chart. Are you okay to refill these meds?

## 2023-03-19 ENCOUNTER — Ambulatory Visit (HOSPITAL_COMMUNITY): Payer: 59 | Admitting: Psychiatry

## 2023-04-13 ENCOUNTER — Ambulatory Visit
Admission: EM | Admit: 2023-04-13 | Discharge: 2023-04-13 | Disposition: A | Discharge status: Home / Self Care | Payer: Managed Care, Other (non HMO) | Attending: Internal Medicine | Admitting: Internal Medicine

## 2023-04-13 DIAGNOSIS — G5622 Lesion of ulnar nerve, left upper limb: Secondary | ICD-10-CM

## 2023-04-13 DIAGNOSIS — M79632 Pain in left forearm: Secondary | ICD-10-CM | POA: Diagnosis not present

## 2023-04-13 DIAGNOSIS — M79642 Pain in left hand: Secondary | ICD-10-CM | POA: Diagnosis not present

## 2023-04-13 MED ORDER — PREDNISONE 50 MG PO TABS: 50.0000 mg | ORAL_TABLET | Freq: Every day | ORAL | 0 refills | Status: DC

## 2023-04-13 NOTE — ED Provider Notes (Signed)
[facility]  Note:  This document was prepared using Conservation officer, historic buildings and may include unintentional dictation errors.  MRN: [id number] DOB: 12-26-1975  Subjective:   Joshua Parrish is a 47 y.o. male presenting for 2-day history of acute onset persistent left-sided ulnar pain of the forearm, hand and pain involving the left fifth finger and ulnar aspect of the left fourth finger.  Symptoms started after he was helping his friend move.  Reports that he heard a pop in his arm, hand when trying to lift the couch.  Since then he has had the symptoms.  No swelling, bruising.  No warmth, erythema, fever.  He does have an orthopedist at [facility].   No current facility-administered medications for this encounter.  Current Outpatient Medications:    albuterol (VENTOLIN HFA) 108 (90 Base) MCG/ACT inhaler, Inhale 2 puffs into the lungs every 6 (six) hours as needed for wheezing or shortness of breath., Disp: 18 g, Rfl: 5   amLODipine (NORVASC) 10 MG tablet, TAKE 1 TABLET BY MOUTH DAILY, Disp: 30 tablet, Rfl: 3   cyclobenzaprine (FLEXERIL) 5 MG tablet, Take 1 tablet (5 mg total) by mouth 3 (three) times daily as needed for muscle spasms., Disp: 30 tablet, Rfl: 0   gabapentin (NEURONTIN) 100 MG capsule, TAKE 1 CAPSULE BY MOUTH 3 TIMES A DAY, Disp: 90 capsule, Rfl: 0   omeprazole (PRILOSEC) 40 MG capsule, TAKE ONE CAPSULE BY MOUTH DAILY, Disp: 90 capsule, Rfl: 3   predniSONE (DELTASONE) 50 MG tablet, Take 1 tablet (50 mg total) by mouth daily with breakfast., Disp: 5 tablet, Rfl: 0   tadalafil (CIALIS) 20 MG tablet, Take 20 mg by mouth daily as needed., Disp: , Rfl:    testosterone cypionate (DEPOTESTOSTERONE CYPIONATE) 200 MG/ML injection, Inject 100 mg into the muscle every Thursday., Disp: , Rfl:    traZODone (DESYREL) 100 MG tablet, TAKE 1 TABLET BY MOUTH AT BEDTIME, Disp: 30 tablet, Rfl: 0   valsartan-hydrochlorothiazide (DIOVAN HCT) 320-25 MG tablet, Take 1  tablet by mouth daily., Disp: 90 tablet, Rfl: 0   venlafaxine XR (EFFEXOR-XR) 75 MG 24 hr capsule, TAKE 1 CAPSULE BY MOUTH DAILY WITH BREAKFAST, Disp: 30 capsule, Rfl: 0   Allergies  Allergen Reactions   Silicone Rash    Dermabond   Atarax [Hydroxyzine]     Patient notes made anxiety worse-   Coconut Oil Diarrhea, Nausea And Vomiting and Other (See Comments)   Lisinopril Cough    Past Medical History:  Diagnosis Date   Anxiety    Chicken pox    Depression    Fatty liver    GERD (gastroesophageal reflux disease)    H/O hernia repair 03/31/2022   Hypertension    Migraines    Osteoarthritis of shoulder    left   Rotator cuff impingement syndrome of left shoulder 06/19/2015   Sinusitis    Sleep apnea      Past Surgical History:  Procedure Laterality Date   APPENDECTOMY     CERVICAL LAMINECTOMY     has plates and fusion   CHOLECYSTECTOMY N/A 12/24/2015   Procedure: LAPAROSCOPIC CHOLECYSTECTOMY ;  Surgeon: Gaynelle Adu, MD;  Location: [facility];  Service: General;  Laterality: N/A;   CLAVICLE EXCISION Left    HYPOSPADIAS CORRECTION     as child   KNEE SURGERY Bilateral    multiple   SHOULDER ARTHROSCOPY WITH ROTATOR CUFF REPAIR Left 06/26/2015   Procedure: SHOULDER ARTHROSCOPY WITH ROTATOR CUFF REPAIR;  Surgeon: Salvatore Marvel, MD;  Location: [facility];  Service: Orthopedics;  Laterality: Left;    Family History  Problem Relation Age of Onset   Arthritis Mother    Ovarian cancer Mother    Alcohol abuse Father    Breast cancer Maternal Uncle    Breast cancer Maternal Grandmother    Cancer Maternal Grandmother        spinal surgery   Cancer Other    Hypertension Other    Diabetes Other    Breast cancer Maternal Great-grandfather    Colon cancer Neg Hx    Sleep apnea Neg Hx     Social History   Tobacco Use   Smoking status: Never   Smokeless tobacco: Current    Types: Chew   Tobacco comments:    form given 11/29/15  Vaping Use   Vaping status:  Never Used  Substance Use Topics   Alcohol use: Yes    Alcohol/week: 0.0 standard drinks of alcohol    Comment: occasional   Drug use: No    ROS   Objective:   Vitals: BP (!) 150/105 (BP Location: Left Arm) Comment: patient state he forgot to take his Medication this AM  Pulse 86   Temp 98.6 F (37 C)   Resp 20   SpO2 95%   Physical Exam Constitutional:      General: He is not in acute distress.    Appearance: Normal appearance. He is well-developed and normal weight. He is not ill-appearing, toxic-appearing or diaphoretic.  HENT:     Head: Normocephalic and atraumatic.     Right Ear: External ear normal.     Left Ear: External ear normal.     Nose: Nose normal.     Mouth/Throat:     Pharynx: Oropharynx is clear.  Eyes:     General: No scleral icterus.       Right eye: No discharge.        Left eye: No discharge.     Extraocular Movements: Extraocular movements intact.  Cardiovascular:     Rate and Rhythm: Normal rate.  Pulmonary:     Effort: Pulmonary effort is normal.  Musculoskeletal:       Arms:     Cervical back: Normal range of motion.     Comments: Near full range of motion, limited by pain.  No swelling, ecchymosis, bony deformity.  No warmth, erythema.  Neurological:     Mental Status: He is alert and oriented to person, place, and time.  Psychiatric:        Mood and Affect: Mood normal.        Behavior: Behavior normal.        Thought Content: Thought content normal.        Judgment: Judgment normal.    Assessment and Plan :   PDMP not reviewed this encounter.  1. Cubital tunnel syndrome on left   2. Left hand pain   3. Left forearm pain    High suspicion for an ulnar nerve neuropathy.  Recommended an oral prednisone course which the patient has done well with.  Follow-up with [facility] or [facility].  Counseled patient on potential for adverse effects with medications prescribed/recommended today, ER and return-to-clinic  precautions discussed, patient verbalized understanding.    Wallis Bamberg, PA-C 04/13/23 1544

## 2023-04-13 NOTE — ED Triage Notes (Signed)
Pt reports pain and in left pinky finger, left ring finger, left hand and radiates to left arm x 2 days after he hear a pop when moving a couch.

## 2023-04-14 NOTE — Patient Instructions (Incomplete)

## 2023-04-14 NOTE — Progress Notes (Unsigned)
No chief complaint on file.   HISTORY OF PRESENT ILLNESS:  04/14/23 ALL:  Joshua Parrish returns for follow up for OSA on CPAP and involuntary jerking. He continues lamotrigine 100mg  BID???  He continues to use CPAP    04/14/2022 ALL: Joshua Parrish is a 47 y.o. male here today for follow up for OSA on CPAP, neck pain and involuntary jerking of extremities. He was last seen in 12/2021 by Dr Terrace Arabia who started lamotrigine 100mg  BID. EEG was normal. He reports muscle jerking has significantly improved. He is tolerating medication well with no obvious adverse effects. He is followed regularly by PCP.   He was last seen by Dr Frances Furbish 10/2021 and having difficulty tolerating pressures. Max pressure decreased from 15 to 12cmH20. He reports doing better, today. He is tolerating settings. Upon review, max pressure remains at 15. He reports that a friend of his made some adjustments to his machine and he seems to be doing better. He is using CPAP most every night for at least 4 hours. He does endorse dry mouth at times. He has woken up some nights and removed his mask during sleep.     HISTORY (copied from Dr Zannie Cove previous note)  Joshua Parrish is a 47 year old male, with seen in request by primary doctor Rodolph Bong, for evaluation of leg jerking movement, initial evaluation was on Jan 09, 2022,  I reviewed and summarized the referring note. PMHX. HTN Depression, anxiety Gout GERD Fatty Liver Obesity. History of cervical decompression  Patient had long history of obesity, obstructive sleep apnea, seeing Dr. Frances Furbish, last visit was in February 23  He had a history of cervical decompression surgery, reported sudden onset neck pain, radiating pain to left shoulder, left-sided weakness prior to surgical decompression, surgery did help him,  He also had left shoulder arthroscopic surgery  Since 2022, after carrying a big heavy box upstairs, he began to noticed intermittent left arm shaking, and if hold up his  left leg, he also have left lower extremity shaking, improved by putting down the left arm and left leg,  Similar involvement on the right arm and right lower extremity as well, but much less frequent, he works on [employer] business, often require prolonged walking, and heavy lifting, he complains of a year history of worsening low back pain after prolonged standing and walking," my lower back is killing me,",  He also complains of worsening neck pain, radiating pain to left shoulder, and to his left dominant hand,  He reported a history of abnormal brain scan as an infant, improved later, denies history of seizure, but during left hand and left jerking episode, sometimes he felt transient confusion, sweaty, last about 10 minutes  I was able to witnessed multiple episode, during deep tendon reflex examination, he had both lower extremity high-frequency myoclonic jerking-like movement, but if he is extending his left leg, he would trigger similar abnormal left leg movement, also involving left arm, seems to have variable effort on examinations,  Personally reviewed MRI of cervical spine in April 2023, evidence of C6-7 ACDF, mild degenerative changes no significant canal stenosis, no evidence of cord signal abnormality MRI of thoracic spine, mild degenerative changes, no cord compression MRI of lumbar spine, multilevel degenerative changes no significance canal or foraminal narrowing, MRI of brain, unremarkable  Lab in April 23, normal CBC, TSH   REVIEW OF SYSTEMS: Out of a complete 14 system review of symptoms, the patient complains only of the following  symptoms, none and all other reviewed systems are negative.   ALLERGIES: Allergies  Allergen Reactions   Silicone Rash    Dermabond   Atarax [Hydroxyzine]     Patient notes made anxiety worse-   Coconut Oil Diarrhea, Nausea And Vomiting and Other (See Comments)   Lisinopril Cough     HOME MEDICATIONS: Outpatient Medications  Prior to Visit  Medication Sig Dispense Refill   albuterol (VENTOLIN HFA) 108 (90 Base) MCG/ACT inhaler Inhale 2 puffs into the lungs every 6 (six) hours as needed for wheezing or shortness of breath. 18 g 5   amLODipine (NORVASC) 10 MG tablet TAKE 1 TABLET BY MOUTH DAILY 30 tablet 3   cyclobenzaprine (FLEXERIL) 5 MG tablet Take 1 tablet (5 mg total) by mouth 3 (three) times daily as needed for muscle spasms. 30 tablet 0   gabapentin (NEURONTIN) 100 MG capsule TAKE 1 CAPSULE BY MOUTH 3 TIMES A DAY 90 capsule 0   omeprazole (PRILOSEC) 40 MG capsule TAKE ONE CAPSULE BY MOUTH DAILY 90 capsule 3   predniSONE (DELTASONE) 50 MG tablet Take 1 tablet (50 mg total) by mouth daily with breakfast. 5 tablet 0   tadalafil (CIALIS) 20 MG tablet Take 20 mg by mouth daily as needed.     testosterone cypionate (DEPOTESTOSTERONE CYPIONATE) 200 MG/ML injection Inject 100 mg into the muscle every Thursday.     traZODone (DESYREL) 100 MG tablet TAKE 1 TABLET BY MOUTH AT BEDTIME 30 tablet 0   valsartan-hydrochlorothiazide (DIOVAN HCT) 320-25 MG tablet Take 1 tablet by mouth daily. 90 tablet 0   venlafaxine XR (EFFEXOR-XR) 75 MG 24 hr capsule TAKE 1 CAPSULE BY MOUTH DAILY WITH BREAKFAST 30 capsule 0   No facility-administered medications prior to visit.     PAST MEDICAL HISTORY: Past Medical History:  Diagnosis Date   Anxiety    Chicken pox    Depression    Fatty liver    GERD (gastroesophageal reflux disease)    H/O hernia repair 03/31/2022   Hypertension    Migraines    Osteoarthritis of shoulder    left   Rotator cuff impingement syndrome of left shoulder 06/19/2015   Sinusitis    Sleep apnea      PAST SURGICAL HISTORY: Past Surgical History:  Procedure Laterality Date   APPENDECTOMY     CERVICAL LAMINECTOMY     has plates and fusion   CHOLECYSTECTOMY N/A 12/24/2015   Procedure: LAPAROSCOPIC CHOLECYSTECTOMY ;  Surgeon: Gaynelle Adu, MD;  Location: [facility];  Service: General;  Laterality: N/A;    CLAVICLE EXCISION Left    HYPOSPADIAS CORRECTION     as child   KNEE SURGERY Bilateral    multiple   SHOULDER ARTHROSCOPY WITH ROTATOR CUFF REPAIR Left 06/26/2015   Procedure: SHOULDER ARTHROSCOPY WITH ROTATOR CUFF REPAIR;  Surgeon: Salvatore Marvel, MD;  Location: [facility];  Service: Orthopedics;  Laterality: Left;     FAMILY HISTORY: Family History  Problem Relation Age of Onset   Arthritis Mother    Ovarian cancer Mother    Alcohol abuse Father    Breast cancer Maternal Uncle    Breast cancer Maternal Grandmother    Cancer Maternal Grandmother        spinal surgery   Cancer Other    Hypertension Other    Diabetes Other    Breast cancer Maternal Great-grandfather    Colon cancer Neg Hx    Sleep apnea Neg Hx      SOCIAL HISTORY: Social  History   Socioeconomic History   Marital status: Divorced    Spouse name: Not on file   Number of children: 1   Years of education: 98   Highest education level: Not on file  Occupational History   Occupation: Garage Administrator, sports   Tobacco Use   Smoking status: Never   Smokeless tobacco: Current    Types: Chew   Tobacco comments:    form given 11/29/15  Vaping Use   Vaping status: Never Used  Substance and Sexual Activity   Alcohol use: Yes    Alcohol/week: 0.0 standard drinks of alcohol    Comment: occasional   Drug use: No   Sexual activity: Yes  Other Topics Concern   Not on file  Social History Narrative   Fun: Lift weights, hike   Social Determinants of Health   Financial Resource Strain: Not on file  Food Insecurity: No Food Insecurity (12/19/2022)   Hunger Vital Sign    Worried About Running Out of Food in the Last Year: Never true    Ran Out of Food in the Last Year: Never true  Transportation Needs: No Transportation Needs (12/19/2022)   PRAPARE - Administrator, Civil Service (Medical): No    Lack of Transportation (Non-Medical): No  Physical Activity: Not on file  Stress: Not on  file  Social Connections: Not on file  Intimate Partner Violence: Not At Risk (12/19/2022)   Humiliation, Afraid, Rape, and Kick questionnaire    Fear of Current or Ex-Partner: No    Emotionally Abused: No    Physically Abused: No    Sexually Abused: No     PHYSICAL EXAM  There were no vitals filed for this visit.  There is no height or weight on file to calculate BMI.  Generalized: Well developed, in no acute distress  Cardiology: normal rate and rhythm, no murmur auscultated  Respiratory: clear to auscultation bilaterally    Neurological examination  Mentation: Alert oriented to time, place, history taking. Follows all commands speech and language fluent Cranial nerve II-XII: Pupils were equal round reactive to light. Extraocular movements were full, visual field were full on confrontational test. Facial sensation and strength were normal. Head turning and shoulder shrug  were normal and symmetric. Motor: The motor testing reveals 5 over 5 strength of all 4 extremities. Good symmetric motor tone is noted throughout.  Gait and station: Gait is normal.     DIAGNOSTIC DATA (LABS, IMAGING, TESTING) - I reviewed patient records, labs, notes, testing and imaging myself where available.  Lab Results  Component Value Date   WBC 8.2 01/08/2023   HGB 14.5 01/08/2023   HCT 43.2 01/08/2023   MCV 87.4 01/08/2023   PLT 361.0 01/08/2023      Component Value Date/Time   NA 141 01/08/2023 0852   K 4.1 01/08/2023 0852   CL 100 01/08/2023 0852   CO2 31 01/08/2023 0852   GLUCOSE 99 01/08/2023 0852   BUN 9 01/08/2023 0852   CREATININE 0.94 01/08/2023 0852   CREATININE 0.85 09/20/2021 1544   CALCIUM 9.6 01/08/2023 0852   PROT 7.3 01/05/2023 1742   PROT 6.7 01/09/2022 1020   ALBUMIN 4.5 01/05/2023 1742   AST 29 01/05/2023 1742   ALT 30 01/05/2023 1742   ALKPHOS 68 01/05/2023 1742   BILITOT 0.5 01/05/2023 1742   GFRNONAA >60 01/05/2023 1742   GFRAA >60 02/06/2019 2359   Lab  Results  Component Value Date   CHOL 229 (H)  04/19/2021   HDL 34.30 (L) 04/19/2021   LDLDIRECT 120.0 04/19/2021   TRIG 359.0 (H) 04/19/2021   CHOLHDL 7 04/19/2021   Lab Results  Component Value Date   HGBA1C 5.4 10/14/2022   Lab Results  Component Value Date   VITAMINB12 529 01/09/2022   Lab Results  Component Value Date   TSH 1.48 12/25/2021        No data to display               No data to display           ASSESSMENT AND PLAN  47 y.o. year old male  has a past medical history of Anxiety, Chicken pox, Depression, Fatty liver, GERD (gastroesophageal reflux disease), H/O hernia repair (03/31/2022), Hypertension, Migraines, Osteoarthritis of shoulder, Rotator cuff impingement syndrome of left shoulder (06/19/2015), Sinusitis, and Sleep apnea. here with    No diagnosis found.  JDYN RESENDIZ is doing well. 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 He will continue 100mg  BID. Compliance report shows excellent compliance. He is tolerating mas pressure of 15cmH20. He was encouraged to continue using CPAp nightly for at least 4 hours. May consider adjustment of humidity and Biotene OTC as needed. Healthy lifestyle habits encouraged. He will follow up with PCP as directed. He will return to see me in 1 year, sooner if needed. He verbalizes understanding and agreement with this plan.   No orders of the defined types were placed in this encounter.    No orders of the defined types were placed in this encounter.    Shawnie Dapper, MSN, FNP-C 04/14/2023, 12:59 PM  [facility] [address] [place], [place] [address] [phone]

## 2023-04-15 ENCOUNTER — Encounter: Payer: Self-pay | Admitting: Family Medicine

## 2023-04-15 ENCOUNTER — Ambulatory Visit: Payer: Managed Care, Other (non HMO) | Admitting: Family Medicine

## 2023-04-15 VITALS — BP 168/109 | HR 91 | Ht 68.0 in | Wt 312.0 lb

## 2023-04-15 DIAGNOSIS — G253 Myoclonus: Secondary | ICD-10-CM

## 2023-04-15 DIAGNOSIS — G4733 Obstructive sleep apnea (adult) (pediatric): Secondary | ICD-10-CM | POA: Diagnosis not present

## 2023-04-15 DIAGNOSIS — T50902A Poisoning by unspecified drugs, medicaments and biological substances, intentional self-harm, initial encounter: Secondary | ICD-10-CM

## 2023-04-15 DIAGNOSIS — F339 Major depressive disorder, recurrent, unspecified: Secondary | ICD-10-CM | POA: Diagnosis not present

## 2023-04-18 ENCOUNTER — Other Ambulatory Visit: Payer: Self-pay | Admitting: Family

## 2023-05-11 ENCOUNTER — Other Ambulatory Visit: Payer: Self-pay | Admitting: Family

## 2023-05-15 ENCOUNTER — Other Ambulatory Visit: Payer: Self-pay | Admitting: Orthopedic Surgery

## 2023-05-15 DIAGNOSIS — M25512 Pain in left shoulder: Secondary | ICD-10-CM

## 2023-05-15 DIAGNOSIS — M25511 Pain in right shoulder: Secondary | ICD-10-CM

## 2023-05-25 ENCOUNTER — Ambulatory Visit: Payer: Managed Care, Other (non HMO)

## 2023-05-25 DIAGNOSIS — M25511 Pain in right shoulder: Secondary | ICD-10-CM | POA: Diagnosis not present

## 2023-05-25 DIAGNOSIS — M67912 Unspecified disorder of synovium and tendon, left shoulder: Secondary | ICD-10-CM

## 2023-05-25 DIAGNOSIS — M25512 Pain in left shoulder: Secondary | ICD-10-CM

## 2023-06-08 ENCOUNTER — Other Ambulatory Visit: Payer: Self-pay | Admitting: Family

## 2023-07-06 ENCOUNTER — Telehealth: Payer: Managed Care, Other (non HMO) | Admitting: Physician Assistant

## 2023-07-06 DIAGNOSIS — M1A0720 Idiopathic chronic gout, left ankle and foot, without tophus (tophi): Secondary | ICD-10-CM | POA: Diagnosis not present

## 2023-07-06 MED ORDER — COLCHICINE 0.6 MG PO TABS
ORAL_TABLET | ORAL | 0 refills | Status: DC
Start: 2023-07-06 — End: 2023-11-06

## 2023-07-06 NOTE — Progress Notes (Signed)
E-Visit for Gout Symptoms  We are sorry that you are not feeling well. We are here to help!  Based on what you shared with me it looks like you have a flare of your gout.  Gout is a form of arthritis. It can cause pain and swelling in the joints. At first, it tends to affect only 1 joint - most frequently the big toe. It happens in people who have too much uric acid in the blood. Uric acid is a chemical that is produced when the body breaks down certain foods. Uric acid can form sharp needle-like crystals that build up in the joints and cause pain. Uric acid crystals can also form inside the tubes that carry urine from the kidneys to the bladder. These crystals can turn into "kidney stones" that can cause pain and problems with the flow of urine. People with gout get sudden "flares" or attacks of severe pain, most often the big toe, ankle, or knee. Often the joint also turns red and swells. Usually, only 1 joint is affected, but some people have pain in more than 1 joint. Gout flares tend to happen more often during the night.  The pain from gout can be extreme. The pain and swelling are worst at the beginning of a gout flare. The symptoms then get better within a few days to weeks. It is not clear how the body "turns off" a gout flare.  Do not start any NEW preventative medicine until the gout has cleared completely. However, If you are already on Probenecid or Allopurinol for CHRONIC gout, you may continue taking this during an active flare up  I have prescribed Colchicine 0.6 mg tabs - Take 2 tabs immediately, then 1 tab twice per day for the duration of the flare up to a max of 7 days (but discontinue for stomach pains or diarrhea)    HOME CARE Losing weight can help relieve gout. It's not clear that following a specific diet plan will help with gout symptoms but eating a balanced diet can help improve your overall health. It can also help you lose weight, if you are overweight. In general, a  healthy diet includes plenty of fruits, vegetables, whole grains, and low-fat dairy products (labelled "low fat", skim, 2%). Avoid sugar sweetened drinks (including sodas, tea, juice and juice blends, coffee drinks and sports drinks) Limit alcohol to 1-2 drinks of beer, spirits or wine daily these can make gout flares worse. Some people with gout also have other health problems, such as heart disease, high blood pressure, kidney disease, or obesity. If you have any of these issues, it's important to work with your doctor to manage them. This can help improve your overall health and might also help with your gout.  GET HELP RIGHT AWAY IF: Your symptoms persist after you have completed your treatment plan You develop severe diarrhea You develop abnormal sensations  You develop vomiting,   You develop weakness  You develop abdominal pain  FOLLOW UP WITH YOUR PRIMARY PROVIDER IF: If your symptoms do not improve within 10 days  MAKE SURE YOU  Understand these instructions. Will watch your condition. Will get help right away if you are not doing well or get worse.  Thank you for choosing an e-visit.  Your e-visit answers were reviewed by a board certified advanced clinical practitioner to complete your personal care plan. Depending upon the condition, your plan could have included both over the counter or prescription medications.  Please review your  pharmacy choice. Make sure the pharmacy is open so you can pick up prescription now. If there is a problem, you may contact your provider through [employer] and have the prescription routed to another pharmacy.  Your safety is important to [place]. If you have drug allergies check your prescription carefully.   For the next 24 hours you can use MyChart to ask questions about today's visit, request a non-urgent call back, or ask for a work or school excuse. You will get an email in the next two days asking about your experience. I hope that your  e-visit has been valuable and will speed your recovery.  I have spent 5 minutes in review of e-visit questionnaire, review and updating patient chart, medical decision making and response to patient.   Margaretann Loveless, PA-C

## 2023-08-11 ENCOUNTER — Other Ambulatory Visit: Payer: Self-pay | Admitting: Family

## 2023-08-14 ENCOUNTER — Encounter: Payer: Self-pay | Admitting: Family

## 2023-08-14 ENCOUNTER — Ambulatory Visit (INDEPENDENT_AMBULATORY_CARE_PROVIDER_SITE_OTHER): Payer: Managed Care, Other (non HMO) | Admitting: Family

## 2023-08-14 VITALS — BP 138/84 | HR 105 | Ht 68.0 in | Wt 322.6 lb

## 2023-08-14 DIAGNOSIS — Z13220 Encounter for screening for lipoid disorders: Secondary | ICD-10-CM

## 2023-08-14 DIAGNOSIS — Z23 Encounter for immunization: Secondary | ICD-10-CM | POA: Diagnosis not present

## 2023-08-14 DIAGNOSIS — R635 Abnormal weight gain: Secondary | ICD-10-CM

## 2023-08-14 DIAGNOSIS — Z0000 Encounter for general adult medical examination without abnormal findings: Secondary | ICD-10-CM

## 2023-08-14 DIAGNOSIS — Z125 Encounter for screening for malignant neoplasm of prostate: Secondary | ICD-10-CM

## 2023-08-14 DIAGNOSIS — Z6841 Body Mass Index (BMI) 40.0 and over, adult: Secondary | ICD-10-CM

## 2023-08-14 MED ORDER — GABAPENTIN 100 MG PO CAPS: 100.0000 mg | ORAL_CAPSULE | Freq: Three times a day (TID) | ORAL | 0 refills | Status: DC

## 2023-08-14 MED ORDER — OMEPRAZOLE 40 MG PO CPDR: 40.0000 mg | DELAYED_RELEASE_CAPSULE | Freq: Every day | ORAL | 3 refills | Status: DC

## 2023-08-14 MED ORDER — AMLODIPINE BESYLATE 10 MG PO TABS: 10.0000 mg | ORAL_TABLET | Freq: Every day | ORAL | 3 refills | Status: DC

## 2023-08-14 MED ORDER — VALSARTAN-HYDROCHLOROTHIAZIDE 320-25 MG PO TABS: 1.0000 | ORAL_TABLET | Freq: Every day | ORAL | 3 refills | Status: DC

## 2023-08-14 NOTE — Patient Instructions (Addendum)
I don't prescribe Phentermine- am happy to refer you to [facility]. They will reach out to you to do a consult to see if this is the right program for you.   I also need you to talk to your back specialist about the continued back/ nerve pain ( Dr. Modesto Charon)- we will give you a short term refill on Neurontin;

## 2023-08-14 NOTE — Progress Notes (Signed)
Joshua Parrish is a 47 y.o. male with the following history as recorded in EpicCare:  Patient Active Problem List   Diagnosis Date Noted   Suicidal ideations 12/19/2022   Suicidal ideation 12/18/2022   Major depressive disorder, recurrent episode, severe (HCC) 11/13/2022   Alcohol abuse 11/13/2022   Acute drug overdose, intentional self-harm, initial encounter (HCC) 11/12/2022   OD (overdose of drug), intentional self-harm, initial encounter (HCC) 11/11/2022   Insomnia disorder, with other sleep disorder, recurrent 11/11/2022   RSV bronchitis 08/14/2022   Chronic gout 06/13/2022   Abdominal pain 02/16/2022   Gait abnormality 01/09/2022   Morbid obesity with BMI of 45.0-49.9, adult (HCC) 01/09/2022   OSA on CPAP 01/09/2022   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   Low back pain 01/09/2022   Rotator cuff disorder, left 12/25/2017   Bilateral shoulder pain 05/22/2017   Left knee pain 05/22/2017   Urinary frequency 02/09/2017   Essential hypertension 01/16/2017   Prostatitis 12/25/2016   Sepsis (HCC) 12/25/2016   GERD without esophagitis 12/25/2016   Sinusitis 12/09/2016   Tendinitis of right rotator cuff 12/09/2016   Laryngopharyngeal reflux (LPR) 02/04/2016   Mass in neck 11/27/2015   Rotator cuff impingement syndrome of left shoulder 06/19/2015   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 18 g 5   amLODipine (NORVASC) 10 MG tablet Take 1 tablet (10 mg total) by mouth daily. 90 tablet 3   colchicine 0.6 MG tablet Take 2 tabs immediately, then 1 tab twice per day for the duration of the flare up to a max of 7 days (but discontinue for stomach pains or diarrhea) (Patient not taking: Reported on 08/14/2023) 30 tablet 0   gabapentin (NEURONTIN) 100 MG capsule Take 1 capsule (100 mg  total) by mouth 3 (three) times daily. 90 capsule 0   omeprazole (PRILOSEC) 40 MG capsule Take 1 capsule (40 mg total) by mouth daily. 90 capsule 3   valsartan-hydrochlorothiazide (DIOVAN-HCT) 320-25 MG tablet Take 1 tablet by mouth daily. 90 tablet 3   No current facility-administered medications for this visit.    Allergies: Silicone, Atarax [hydroxyzine], Coconut oil, and Lisinopril  Past Medical History:  Diagnosis Date   Anxiety    Chicken pox    Depression    Fatty liver    GERD (gastroesophageal reflux disease)    H/O hernia repair 03/31/2022   Hypertension    Migraines    Osteoarthritis of shoulder    left   Rotator cuff impingement syndrome of left shoulder 06/19/2015   Sinusitis    Sleep apnea     Past Surgical History:  Procedure Laterality Date   APPENDECTOMY     CERVICAL LAMINECTOMY     has plates and fusion   CHOLECYSTECTOMY N/A 12/24/2015   Procedure: LAPAROSCOPIC CHOLECYSTECTOMY ;  Surgeon: Gaynelle Adu, MD;  Location: [facility];  Service: General;  Laterality: N/A;   CLAVICLE EXCISION Left    HYPOSPADIAS CORRECTION     as child   KNEE SURGERY Bilateral    multiple   SHOULDER ARTHROSCOPY WITH ROTATOR CUFF REPAIR Left 06/26/2015   Procedure: SHOULDER ARTHROSCOPY WITH ROTATOR CUFF REPAIR;  Surgeon: Salvatore Marvel, MD;  Location: [facility];  Service: Orthopedics;  Laterality: Left;    Family History  Problem Relation Age of Onset  Arthritis Mother    Ovarian cancer Mother    Alcohol abuse Father    Breast cancer Maternal Uncle    Breast cancer Maternal Grandmother    Cancer Maternal Grandmother        spinal surgery   Cancer Other    Hypertension Other    Diabetes Other    Breast cancer Maternal Great-grandfather    Colon cancer Neg Hx    Sleep apnea Neg Hx     Social History   Tobacco Use   Smoking status: Never   Smokeless tobacco: Current    Types: Chew   Tobacco comments:    form given 11/29/15  Substance Use Topics   Alcohol  use: Yes    Alcohol/week: 0.0 standard drinks of alcohol    Comment: occasional    Subjective:   Requesting CPE today; needs biometric 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   HENT: Negative.    Eyes: Negative.   Respiratory: Negative.    Cardiovascular: Negative.   Gastrointestinal: Negative.   Genitourinary: Negative.   Musculoskeletal: Negative.   Skin: Negative.   Neurological: Negative.   Endo/Heme/Allergies: Negative.   Psychiatric/Behavioral: Negative.       Objective:  Vitals:   08/14/23 1432  BP: 138/84  Pulse: (!) 105  SpO2: 97%  Weight: (!) 322 lb 9.6 oz (146.3 kg)  Height: 5\' 8"  (1.727 m)    General: Well developed, well nourished, in no acute distress  Skin : Warm and dry.  Head: Normocephalic and atraumatic  Eyes: Sclera and conjunctiva clear; pupils round and reactive to light; extraocular movements intact  Ears: External normal; canals clear; tympanic membranes normal  Oropharynx: Pink, supple. No suspicious lesions  Neck: Supple without thyromegaly, adenopathy  Lungs: Respirations unlabored; clear to auscultation bilaterally without wheeze, rales, rhonchi  CVS exam: normal rate and regular rhythm.  Abdomen: Soft; nontender; nondistended; normoactive bowel sounds; no masses or hepatosplenomegaly  Musculoskeletal: No deformities; no active joint inflammation  Extremities: No edema, cyanosis, clubbing  Vessels: Symmetric bilaterally  Neurologic: Alert and oriented; speech intact; face symmetrical; moves all extremities well; CNII-XII intact without focal deficit   Assessment:  1. PE (physical exam), annual   2. BMI 45.0-49.9, adult (HCC)   3. Morbid obesity (HCC)   4. Lipid screening   5. Weight gain   6. Prostate cancer screening     Plan:  Age appropriate preventive healthcare needs  addressed; encouraged regular eye doctor and dental exams; encouraged regular exercise; will update labs and refills as needed today; follow-up in 6 months; Follow up with his back specialist at [facility] to discuss dosage and long term management of Gabapentin;  He is referred to [facility] to discuss options for weight management. Flu shot updated today;     Return in about 6 months (around 02/12/2024) for follow up.  Orders Placed This Encounter  Procedures   CBC with Differential/Platelet   Comp Met (CMET)   Lipid panel   TSH   PSA   Hemoglobin A1c   Amb Ref to Medical Weight Management    Referral Priority:   Routine    Referral Type:   Consultation    Number of Visits Requested:   1    Requested Prescriptions   Signed Prescriptions Disp Refills   gabapentin (NEURONTIN) 100 MG capsule 90  capsule 0    Sig: Take 1 capsule (100 mg total) by mouth 3 (three) times daily.   amLODipine (NORVASC) 10 MG tablet 90 tablet 3    Sig: Take 1 tablet (10 mg total) by mouth daily.   omeprazole (PRILOSEC) 40 MG capsule 90 capsule 3    Sig: Take 1 capsule (40 mg total) by mouth daily.   valsartan-hydrochlorothiazide (DIOVAN-HCT) 320-25 MG tablet 90 tablet 3    Sig: Take 1 tablet by mouth daily.

## 2023-08-15 LAB — CBC WITH DIFFERENTIAL/PLATELET
Basophils Absolute: 0 10*3/uL (ref 0.0–0.2)
Basos: 0 %
EOS (ABSOLUTE): 0 10*3/uL (ref 0.0–0.4)
Eos: 0 %
Hematocrit: 45.6 % (ref 37.5–51.0)
Hemoglobin: 14.8 g/dL (ref 13.0–17.7)
Immature Grans (Abs): 0.1 10*3/uL (ref 0.0–0.1)
Immature Granulocytes: 1 %
Lymphocytes Absolute: 3.2 10*3/uL — ABNORMAL HIGH (ref 0.7–3.1)
Lymphs: 29 %
MCH: 29.7 pg (ref 26.6–33.0)
MCHC: 32.5 g/dL (ref 31.5–35.7)
MCV: 91 fL (ref 79–97)
Monocytes Absolute: 0.6 10*3/uL (ref 0.1–0.9)
Monocytes: 6 %
Neutrophils Absolute: 7 10*3/uL (ref 1.4–7.0)
Neutrophils: 64 %
Platelets: 341 10*3/uL (ref 150–450)
RBC: 4.99 x10E6/uL (ref 4.14–5.80)
RDW: 12.7 % (ref 11.6–15.4)
WBC: 10.9 10*3/uL — ABNORMAL HIGH (ref 3.4–10.8)

## 2023-08-15 LAB — COMPREHENSIVE METABOLIC PANEL
ALT: 39 [IU]/L (ref 0–44)
AST: 25 [IU]/L (ref 0–40)
Albumin: 4.8 g/dL (ref 4.1–5.1)
Alkaline Phosphatase: 114 [IU]/L (ref 44–121)
BUN/Creatinine Ratio: 13 (ref 9–20)
BUN: 12 mg/dL (ref 6–24)
Bilirubin Total: 0.4 mg/dL (ref 0.0–1.2)
CO2: 23 mmol/L (ref 20–29)
Calcium: 9.8 mg/dL (ref 8.7–10.2)
Chloride: 97 mmol/L (ref 96–106)
Creatinine, Ser: 0.92 mg/dL (ref 0.76–1.27)
Globulin, Total: 2.3 g/dL (ref 1.5–4.5)
Glucose: 81 mg/dL (ref 70–99)
Potassium: 4.1 mmol/L (ref 3.5–5.2)
Sodium: 139 mmol/L (ref 134–144)
Total Protein: 7.1 g/dL (ref 6.0–8.5)
eGFR: 103 mL/min/{1.73_m2} (ref >59)

## 2023-08-15 LAB — HEMOGLOBIN A1C
Est. average glucose Bld gHb Est-mCnc: 117 mg/dL
Hgb A1c MFr Bld: 5.7 % — ABNORMAL HIGH (ref 4.8–5.6)

## 2023-08-15 LAB — LIPID PANEL
Chol/HDL Ratio: 7.1 {ratio} — ABNORMAL HIGH (ref 0.0–5.0)
Cholesterol, Total: 263 mg/dL — ABNORMAL HIGH (ref 100–199)
HDL: 37 mg/dL — ABNORMAL LOW (ref >39)
LDL Chol Calc (NIH): 157 mg/dL — ABNORMAL HIGH (ref 0–99)
Triglycerides: 367 mg/dL — ABNORMAL HIGH (ref 0–149)
VLDL Cholesterol Cal: 69 mg/dL — ABNORMAL HIGH (ref 5–40)

## 2023-08-15 LAB — PSA: Prostate Specific Ag, Serum: 0.5 ng/mL (ref 0.0–4.0)

## 2023-08-15 LAB — TSH: TSH: 1.17 u[IU]/mL (ref 0.450–4.500)

## 2023-08-19 ENCOUNTER — Telehealth: Payer: Self-pay | Admitting: Family

## 2023-08-21 ENCOUNTER — Other Ambulatory Visit: Payer: Self-pay | Admitting: Family

## 2023-08-21 DIAGNOSIS — E785 Hyperlipidemia, unspecified: Secondary | ICD-10-CM

## 2023-08-21 MED ORDER — ATORVASTATIN CALCIUM 20 MG PO TABS: 20.0000 mg | ORAL_TABLET | Freq: Every day | ORAL | 1 refills | Status: DC

## 2023-08-21 NOTE — Telephone Encounter (Signed)
Copied from CRM [id number]. Topic: Clinical - Lab/Test Results >> Aug 21, 2023  1:41 PM Florestine Avers wrote: Reason for CRM: Patient called in about his test results, requested a call from the nurse.  Spoke with pt, pt is aware of results and expressed understanding. Scheduled pt a lab appointment 09/17/2022. Please see result note.

## 2023-09-09 ENCOUNTER — Telehealth: Payer: Managed Care, Other (non HMO) | Admitting: Emergency Medicine

## 2023-09-09 DIAGNOSIS — J019 Acute sinusitis, unspecified: Secondary | ICD-10-CM

## 2023-09-09 DIAGNOSIS — B9789 Other viral agents as the cause of diseases classified elsewhere: Secondary | ICD-10-CM

## 2023-09-09 MED ORDER — AZELASTINE HCL 0.1 % NA SOLN: 2.0000 | Freq: Two times a day (BID) | NASAL | 0 refills | Status: DC

## 2023-09-09 NOTE — Progress Notes (Signed)
E-Visit for Sinus Problems  We are sorry that you are not feeling well.  Here is how we plan to help!  Based on what you have shared with me it looks like you have sinusitis.  Sinusitis is inflammation and infection in the sinus cavities of the head.  Based on your presentation I believe you most likely have Acute Viral Sinusitis.This is an infection most likely caused by a virus. There is not specific treatment for viral sinusitis other than to help you with the symptoms until the infection runs its course.  Antibiotics are not recommended by the Infectious Disease Society of [place] unless you have severe symptoms (including high fever) or you have symptoms for more than 10 days. If you still have symptoms after 10 days, antibiotics should be considered.   You may use an oral decongestant such as Mucinex D or if you have glaucoma or high blood pressure use plain Mucinex.   Saline nasal spray help and can safely be used as often as needed. Try using saline irrigation, such as with a neti pot, several times a day while you are sick. Many neti pots come with salt packets premeasured to use to make saline. If you use your own salt, make sure it is kosher salt or sea salt (don't use table salt as it has iodine in it and you don't need that in your nose). Use distilled water to make saline. If you mix your own saline using your own salt, the recipe is 1/4 teaspoon salt in 1 cup warm water. Using saline irrigation can help prevent and treat sinus infections.   For congestion, I have prescribed: Azelastine nasal spray 2 sprays in each nostril twice a day  Some authorities believe that zinc sprays or the use of Echinacea may shorten the course of your symptoms.  Sinus infections are not as easily transmitted as other respiratory infection, however we still recommend that you avoid close contact with loved ones, especially the very young and elderly.  Remember to wash your hands thoroughly throughout the day  as this is the number one way to prevent the spread of infection!  Home Care: Only take medications as instructed by your medical team. Do not take these medications with alcohol. A steam or ultrasonic humidifier can help congestion.  You can place a towel over your head and breathe in the steam from hot water coming from a faucet. Avoid close contacts especially the very young and the elderly. Cover your mouth when you cough or sneeze. Always remember to wash your hands.  Get Help Right Away If: You develop worsening fever or sinus pain. You develop a severe head ache or visual changes. Your symptoms persist after you have completed your treatment plan.  Make sure you Understand these instructions. Will watch your condition. Will get help right away if you are not doing well or get worse.   Thank you for choosing an e-visit.  Your e-visit answers were reviewed by a board certified advanced clinical practitioner to complete your personal care plan. Depending upon the condition, your plan could have included both over the counter or prescription medications.  Please review your pharmacy choice. Make sure the pharmacy is open so you can pick up prescription now. If there is a problem, you may contact your provider through [employer] and have the prescription routed to another pharmacy.  Your safety is important to [place]. If you have drug allergies check your prescription carefully.   For the next  24 hours you can use MyChart to ask questions about today's visit, request a non-urgent call back, or ask for a work or school excuse. You will get an email in the next two days asking about your experience. I hope that your e-visit has been valuable and will speed your recovery.  I have spent 5 minutes in review of e-visit questionnaire, review and updating patient chart, medical decision making and response to patient.   Rica Mast, PhD, FNP-BC

## 2023-09-18 ENCOUNTER — Other Ambulatory Visit: Payer: Managed Care, Other (non HMO)

## 2023-09-21 ENCOUNTER — Other Ambulatory Visit (INDEPENDENT_AMBULATORY_CARE_PROVIDER_SITE_OTHER): Payer: Managed Care, Other (non HMO)

## 2023-09-21 DIAGNOSIS — E785 Hyperlipidemia, unspecified: Secondary | ICD-10-CM

## 2023-09-21 LAB — CBC WITH DIFFERENTIAL/PLATELET
Basophils Absolute: 0 10*3/uL (ref 0.0–0.1)
Basophils Relative: 0.2 % (ref 0.0–3.0)
Eosinophils Absolute: 0.1 10*3/uL (ref 0.0–0.7)
Eosinophils Relative: 0.8 % (ref 0.0–5.0)
HCT: 42.4 % (ref 39.0–52.0)
Hemoglobin: 14.8 g/dL (ref 13.0–17.0)
Lymphocytes Relative: 32.9 % (ref 12.0–46.0)
Lymphs Abs: 3.2 10*3/uL (ref 0.7–4.0)
MCHC: 34.9 g/dL (ref 30.0–36.0)
MCV: 88.5 fL (ref 78.0–100.0)
Monocytes Absolute: 0.5 10*3/uL (ref 0.1–1.0)
Monocytes Relative: 5.6 % (ref 3.0–12.0)
Neutro Abs: 5.9 10*3/uL (ref 1.4–7.7)
Neutrophils Relative %: 60.5 % (ref 43.0–77.0)
Platelets: 343 10*3/uL (ref 150.0–400.0)
RBC: 4.79 Mil/uL (ref 4.22–5.81)
RDW: 13.2 % (ref 11.5–15.5)
WBC: 9.7 10*3/uL (ref 4.0–10.5)

## 2023-09-21 LAB — LIPID PANEL
Cholesterol: 187 mg/dL (ref 0–200)
HDL: 36.8 mg/dL — ABNORMAL LOW (ref >39.00)
LDL Cholesterol: 96 mg/dL (ref 0–99)
NonHDL: 149.7
Total CHOL/HDL Ratio: 5
Triglycerides: 270 mg/dL — ABNORMAL HIGH (ref 0.0–149.0)
VLDL: 54 mg/dL — ABNORMAL HIGH (ref 0.0–40.0)

## 2023-09-21 LAB — COMPREHENSIVE METABOLIC PANEL
ALT: 34 U/L (ref 0–53)
AST: 22 U/L (ref 0–37)
Albumin: 4.6 g/dL (ref 3.5–5.2)
Alkaline Phosphatase: 96 U/L (ref 39–117)
BUN: 10 mg/dL (ref 6–23)
CO2: 25 meq/L (ref 19–32)
Calcium: 9.2 mg/dL (ref 8.4–10.5)
Chloride: 100 meq/L (ref 96–112)
Creatinine, Ser: 0.82 mg/dL (ref 0.40–1.50)
GFR: 103.96 mL/min (ref >60.00)
Glucose, Bld: 89 mg/dL (ref 70–99)
Potassium: 3.5 meq/L (ref 3.5–5.1)
Sodium: 136 meq/L (ref 135–145)
Total Bilirubin: 0.5 mg/dL (ref 0.2–1.2)
Total Protein: 7.5 g/dL (ref 6.0–8.3)

## 2023-09-22 ENCOUNTER — Encounter: Payer: Self-pay | Admitting: Family

## 2023-10-09 ENCOUNTER — Telehealth: Payer: Managed Care, Other (non HMO) | Admitting: Physician Assistant

## 2023-10-09 ENCOUNTER — Telehealth: Payer: Managed Care, Other (non HMO)

## 2023-10-09 ENCOUNTER — Encounter: Payer: Self-pay | Admitting: Nurse Practitioner

## 2023-10-09 ENCOUNTER — Telehealth (INDEPENDENT_AMBULATORY_CARE_PROVIDER_SITE_OTHER): Payer: Managed Care, Other (non HMO) | Admitting: Nurse Practitioner

## 2023-10-09 VITALS — Ht 68.0 in | Wt 320.0 lb

## 2023-10-09 DIAGNOSIS — U071 COVID-19: Secondary | ICD-10-CM

## 2023-10-09 MED ORDER — NIRMATRELVIR/RITONAVIR (PAXLOVID)TABLET: 3.0000 | ORAL_TABLET | Freq: Two times a day (BID) | ORAL | 0 refills | Status: AC

## 2023-10-09 NOTE — Assessment & Plan Note (Signed)
 Positive home COVID-19 test.  Symptoms started 3 days ago.  With history of obesity, sleep apnea, and hypertension will have him start Paxlovid  twice a day for 5 days.  Discussed possible side effects.  Encourage fluids, rest.  Work note given.  Reviewed home care instructions for COVID. Advised self-isolation at home until fever free for 24 hours without tylenol  or ibuprofen  and symptoms are starting to feel better. If symptoms, esp, dyspnea develops/worsens, recommend in-person evaluation at either an urgent care or the emergency room.

## 2023-10-09 NOTE — Patient Instructions (Signed)
 It was great to see you!  Start paxlovid  twice a day for 5 days  Keep drinking fluids and getting rest  Don't take the atorvastatin  or colchicine  while taking paxlovid   Follow-up in person if you start having trouble breathing or are not feeling better.   Take care,  Tinnie Harada, NP

## 2023-10-09 NOTE — Progress Notes (Signed)
 [facility] [address] [place] [address] Dept: [phone] Dept Fax: [phone]  Virtual Video Visit  I connected with Joshua Parrish on 10/09/23 at  3:20 PM EST by a video enabled telemedicine application and verified that I am speaking with the correct person using two identifiers.  Location patient: Home Location provider: Clinic Persons participating in the virtual visit: Patient; Tinnie Harada, NP; Laymon Gladis Sharps, CMA  I discussed the limitations of evaluation and management by telemedicine and the availability of in person appointments. The patient expressed understanding and agreed to proceed.  Chief Complaint  Patient presents with   Covid Positive    Tested positive today    SUBJECTIVE:  HPI: Joshua Parrish is a 48 y.o. male who presents with a positive covid-19 test. Symptoms started 3 days ago.   UPPER RESPIRATORY TRACT INFECTION  Fever: yes Cough: yes Shortness of breath: no Wheezing: no Chest pain: no Chest tightness: no Chest congestion: no Nasal congestion: yes Runny nose: yes Post nasal drip: yes Sneezing: yes Sore throat: no Swollen glands: no Sinus pressure: yes Headache: yes Face pain: no Toothache: no Ear pain: no bilateral Ear pressure: no bilateral Eyes red/itching:no Eye drainage/crusting: no  Vomiting: no Rash: no Fatigue: yes Sick contacts: yes - fiance's daughter, coworkers Context: fluctuating Recurrent sinusitis: no Relief with OTC cold/cough medications:  a little   Treatments attempted: nyquil, dayquil   Patient Active Problem List   Diagnosis Date Noted   COVID-19 10/09/2023   Suicidal ideations 12/19/2022   Suicidal ideation 12/18/2022   Major depressive disorder, recurrent episode, severe (HCC) 11/13/2022   Alcohol abuse 11/13/2022   Acute drug overdose, intentional self-harm, initial encounter (HCC) 11/12/2022   OD (overdose of drug), intentional  self-harm, initial encounter (HCC) 11/11/2022   Insomnia disorder, with other sleep disorder, recurrent 11/11/2022   RSV bronchitis 08/14/2022   Chronic gout 06/13/2022   Abdominal pain 02/16/2022   Gait abnormality 01/09/2022   Morbid obesity with BMI of 45.0-49.9, adult (HCC) 01/09/2022   OSA on CPAP 01/09/2022   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   Low back pain 01/09/2022   Rotator cuff disorder, left 12/25/2017   Bilateral shoulder pain 05/22/2017   Left knee pain 05/22/2017   Urinary frequency 02/09/2017   Essential hypertension 01/16/2017   Prostatitis 12/25/2016   Sepsis (HCC) 12/25/2016   GERD without esophagitis 12/25/2016   Sinusitis 12/09/2016   Tendinitis of right rotator cuff 12/09/2016   Laryngopharyngeal reflux (LPR) 02/04/2016   Mass in neck 11/27/2015   Rotator cuff impingement syndrome of left shoulder 06/19/2015   Acromioclavicular joint arthritis 06/19/2015    Past Surgical History:  Procedure Laterality Date   APPENDECTOMY     CERVICAL LAMINECTOMY     has plates and fusion   CHOLECYSTECTOMY N/A 12/24/2015   Procedure: LAPAROSCOPIC CHOLECYSTECTOMY ;  Surgeon: Camellia Blush, MD;  Location: [facility];  Service: General;  Laterality: N/A;   CLAVICLE EXCISION Left    HYPOSPADIAS CORRECTION     as child   KNEE SURGERY Bilateral    multiple   SHOULDER ARTHROSCOPY WITH ROTATOR CUFF REPAIR Left 06/26/2015   Procedure: SHOULDER ARTHROSCOPY WITH ROTATOR CUFF REPAIR;  Surgeon: Lamar Millman, MD;  Location: [facility];  Service: Orthopedics;  Laterality: Left;    Family History  Problem Relation Age of Onset   Arthritis Mother    Ovarian cancer Mother    Alcohol abuse Father    Breast cancer Maternal Uncle  Breast cancer Maternal Grandmother    Cancer Maternal Grandmother        spinal surgery   Cancer Other    Hypertension Other    Diabetes Other    Breast cancer Maternal Great-grandfather    Colon cancer Neg Hx    Sleep apnea Neg Hx     Social  History   Tobacco Use   Smoking status: Never   Smokeless tobacco: Current    Types: Chew   Tobacco comments:    form given 11/29/15  Vaping Use   Vaping status: Never Used  Substance Use Topics   Alcohol use: Yes    Alcohol/week: 0.0 standard drinks of alcohol    Comment: occasional   Drug use: No     Current Outpatient Medications:    albuterol  (VENTOLIN  HFA) 108 (90 Base) MCG/ACT inhaler, Inhale 2 puffs into the lungs every 6 (six) hours as needed for wheezing or shortness of breath., Disp: 18 g, Rfl: 5   amLODipine  (NORVASC ) 10 MG tablet, Take 1 tablet (10 mg total) by mouth daily., Disp: 90 tablet, Rfl: 3   atorvastatin  (LIPITOR) 20 MG tablet, Take 1 tablet (20 mg total) by mouth daily., Disp: 90 tablet, Rfl: 1   azelastine  (ASTELIN ) 0.1 % nasal spray, Place 2 sprays into both nostrils 2 (two) times daily. Use in each nostril as directed, Disp: 30 mL, Rfl: 0   gabapentin  (NEURONTIN ) 100 MG capsule, Take 1 capsule (100 mg total) by mouth 3 (three) times daily., Disp: 90 capsule, Rfl: 0   nirmatrelvir /ritonavir  (PAXLOVID ) 20 x 150 MG & 10 x 100MG  TABS, Take 3 tablets by mouth 2 (two) times daily for 5 days. (Take nirmatrelvir  150 mg two tablets twice daily for 5 days and ritonavir  100 mg one tablet twice daily for 5 days) Patient GFR is 103, Disp: 30 tablet, Rfl: 0   omeprazole  (PRILOSEC) 40 MG capsule, Take 1 capsule (40 mg total) by mouth daily., Disp: 90 capsule, Rfl: 3   valsartan -hydrochlorothiazide  (DIOVAN -HCT) 320-25 MG tablet, Take 1 tablet by mouth daily., Disp: 90 tablet, Rfl: 3   colchicine  0.6 MG tablet, Take 2 tabs immediately, then 1 tab twice per day for the duration of the flare up to a max of 7 days (but discontinue for stomach pains or diarrhea) (Patient not taking: Reported on 10/09/2023), Disp: 30 tablet, Rfl: 0  Allergies  Allergen Reactions   Silicone Rash    Dermabond   Atarax  [Hydroxyzine ]     Patient notes made anxiety worse-   Coconut Oil Diarrhea, Nausea  And Vomiting and Other (See Comments)   Lisinopril  Cough    ROS: See pertinent positives and negatives per HPI.  OBSERVATIONS/OBJECTIVE:  VITALS per patient if applicable: Today's Vitals   10/09/23 1520  Weight: (!) 320 lb (145.2 kg)  Height: 5' 8 (1.727 m)   Body mass index is 48.66 kg/m.    GENERAL: Alert and oriented. Appears well and in no acute distress.  HEENT: Atraumatic. Conjunctiva clear. No obvious abnormalities on inspection of external nose and ears.  NECK: Normal movements of the head and neck.  LUNGS: On inspection, no signs of respiratory distress. Breathing rate appears normal. No obvious gross SOB, gasping or wheezing, and no conversational dyspnea.  CV: No obvious cyanosis.  MS: Moves all visible extremities without noticeable abnormality.  PSYCH/NEURO: Pleasant and cooperative. No obvious depression or anxiety. Speech and thought processing grossly intact.  ASSESSMENT AND PLAN:  Problem List Items Addressed This Visit  Other   COVID-19 - Primary   Positive home COVID-19 test.  Symptoms started 3 days ago.  With history of obesity, sleep apnea, and hypertension will have him start Paxlovid  twice a day for 5 days.  Discussed possible side effects.  Encourage fluids, rest.  Work note given.  Reviewed home care instructions for COVID. Advised self-isolation at home until fever free for 24 hours without tylenol  or ibuprofen  and symptoms are starting to feel better. If symptoms, esp, dyspnea develops/worsens, recommend in-person evaluation at either an urgent care or the emergency room.       Relevant Medications   nirmatrelvir /ritonavir  (PAXLOVID ) 20 x 150 MG & 10 x 100MG  TABS     I discussed the assessment and treatment plan with the patient. The patient was provided an opportunity to ask questions and all were answered. The patient agreed with the plan and demonstrated an understanding of the instructions.   The patient was advised to call back  or seek an in-person evaluation if the symptoms worsen or if the condition fails to improve as anticipated.   Tinnie DELENA Harada, NP

## 2023-10-09 NOTE — Progress Notes (Signed)
   Thank you for the details you included in the comment boxes. Those details are very helpful in determining the best course of treatment for you and help [place] to provide the best care. Because you are Covid 19 positive, we recommend that you schedule a Virtual Urgent Care video visit in order for the provider to better assess what is going on.  The provider will be able to give you a more accurate diagnosis and treatment plan if we can more freely discuss your symptoms and with the addition of a virtual examination.   If you change your visit to a video visit, we will bill your insurance (similar to an office visit) and you will not be charged for this e-Visit. You will be able to stay at home and speak with the first available [facility] advanced practice provider. The link to do a video visit is in the drop down Menu tab of your Welcome screen in MyChart.     I have spent 5 minutes in review of e-visit questionnaire, review and updating patient chart, medical decision making and response to patient.   Margaretann Loveless, PA-C

## 2023-10-29 ENCOUNTER — Telehealth: Payer: Managed Care, Other (non HMO) | Admitting: Physician Assistant

## 2023-10-29 DIAGNOSIS — J208 Acute bronchitis due to other specified organisms: Secondary | ICD-10-CM

## 2023-10-29 MED ORDER — PREDNISONE 10 MG (21) PO TBPK: ORAL_TABLET | ORAL | 0 refills | Status: DC

## 2023-10-29 MED ORDER — BENZONATATE 100 MG PO CAPS: 100.0000 mg | ORAL_CAPSULE | Freq: Three times a day (TID) | ORAL | 0 refills | Status: DC | PRN

## 2023-10-29 NOTE — Progress Notes (Signed)
 E-Visit for Cough   We are sorry that you are not feeling well.  Here is how we plan to help!  Based on your presentation I believe you most likely have A cough due to a virus.  This is called viral bronchitis and is best treated by rest, plenty of fluids and control of the cough.  You may use Ibuprofen or Tylenol as directed to help your symptoms.     I have prescribed a steroid pack and A prescription cough medication called Tessalon Perles 100mg . You may take 1-2 capsules every 8 hours as needed for your cough.   From your responses in the eVisit questionnaire you describe inflammation in the upper respiratory tract which is causing a significant cough.  This is commonly called Bronchitis and has four common causes:   Allergies Viral Infections Acid Reflux Bacterial Infection Allergies, viruses and acid reflux are treated by controlling symptoms or eliminating the cause. An example might be a cough caused by taking certain blood pressure medications. You stop the cough by changing the medication. Another example might be a cough caused by acid reflux. Controlling the reflux helps control the cough.  USE OF BRONCHODILATOR ("RESCUE") INHALERS: There is a risk from using your bronchodilator too frequently.  The risk is that over-reliance on a medication which only relaxes the muscles surrounding the breathing tubes can reduce the effectiveness of medications prescribed to reduce swelling and congestion of the tubes themselves.  Although you feel brief relief from the bronchodilator inhaler, your asthma may actually be worsening with the tubes becoming more swollen and filled with mucus.  This can delay other crucial treatments, such as oral steroid medications. If you need to use a bronchodilator inhaler daily, several times per day, you should discuss this with your provider.  There are probably better treatments that could be used to keep your asthma under control.     HOME CARE Only take  medications as instructed by your medical team. Complete the entire course of an antibiotic. Drink plenty of fluids and get plenty of rest. Avoid close contacts especially the very young and the elderly Cover your mouth if you cough or cough into your sleeve. Always remember to wash your hands A steam or ultrasonic humidifier can help congestion.   GET HELP RIGHT AWAY IF: You develop worsening fever. You become short of breath You cough up blood. Your symptoms persist after you have completed your treatment plan MAKE SURE YOU  Understand these instructions. Will watch your condition. Will get help right away if you are not doing well or get worse.    Thank you for choosing an e-visit.  Your e-visit answers were reviewed by a board certified advanced clinical practitioner to complete your personal care plan. Depending upon the condition, your plan could have included both over the counter or prescription medications.  Please review your pharmacy choice. Make sure the pharmacy is open so you can pick up prescription now. If there is a problem, you may contact your provider through [employer] and have the prescription routed to another pharmacy.  Your safety is important to [place]. If you have drug allergies check your prescription carefully.   For the next 24 hours you can use MyChart to ask questions about today's visit, request a non-urgent call back, or ask for a work or school excuse. You will get an email in the next two days asking about your experience. I hope that your e-visit has been valuable and will speed  your recovery.

## 2023-10-29 NOTE — Progress Notes (Signed)
 I have spent 5 minutes in review of e-visit questionnaire, review and updating patient chart, medical decision making and response to patient.   Piedad Climes, PA-C

## 2023-11-06 ENCOUNTER — Ambulatory Visit (HOSPITAL_BASED_OUTPATIENT_CLINIC_OR_DEPARTMENT_OTHER)
Admission: RE | Admit: 2023-11-06 | Discharge: 2023-11-06 | Disposition: A | Discharge status: Home / Self Care | Source: Ambulatory Visit | Attending: Family | Admitting: Family

## 2023-11-06 ENCOUNTER — Encounter: Payer: Self-pay | Admitting: Family

## 2023-11-06 ENCOUNTER — Ambulatory Visit: Payer: Self-pay | Admitting: Family

## 2023-11-06 VITALS — BP 142/88 | HR 106 | Temp 98.5°F | Ht 68.0 in | Wt 328.4 lb

## 2023-11-06 DIAGNOSIS — J069 Acute upper respiratory infection, unspecified: Secondary | ICD-10-CM | POA: Diagnosis not present

## 2023-11-06 DIAGNOSIS — R053 Chronic cough: Secondary | ICD-10-CM

## 2023-11-06 DIAGNOSIS — R051 Acute cough: Secondary | ICD-10-CM | POA: Diagnosis not present

## 2023-11-06 MED ORDER — ALBUTEROL SULFATE HFA 108 (90 BASE) MCG/ACT IN AERS: 2.0000 | INHALATION_SPRAY | Freq: Four times a day (QID) | RESPIRATORY_TRACT | 5 refills | Status: DC | PRN

## 2023-11-06 MED ORDER — FLUTICASONE FUROATE-VILANTEROL 200-25 MCG/ACT IN AEPB: 1.0000 | INHALATION_SPRAY | Freq: Every day | RESPIRATORY_TRACT | 0 refills | Status: DC

## 2023-11-06 MED ORDER — AZITHROMYCIN 250 MG PO TABS: ORAL_TABLET | ORAL | 0 refills | Status: AC

## 2023-11-06 NOTE — Progress Notes (Signed)
 Joshua Parrish is a 48 y.o. male with the following history as recorded in [employer]:  Patient Active Problem List   Diagnosis Date Noted   COVID-19 10/09/2023   Suicidal ideations 12/19/2022   Suicidal ideation 12/18/2022   Major depressive disorder, recurrent episode, severe (HCC) 11/13/2022   Alcohol abuse 11/13/2022   Acute drug overdose, intentional self-harm, initial encounter (HCC) 11/12/2022   OD (overdose of drug), intentional self-harm, initial encounter (HCC) 11/11/2022   Insomnia disorder, with other sleep disorder, recurrent 11/11/2022   RSV bronchitis 08/14/2022   Chronic gout 06/13/2022   Abdominal pain 02/16/2022   Gait abnormality 01/09/2022   Morbid obesity with BMI of 45.0-49.9, adult (HCC) 01/09/2022   OSA on CPAP 01/09/2022   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   Low back pain 01/09/2022   Rotator cuff disorder, left 12/25/2017   Bilateral shoulder pain 05/22/2017   Left knee pain 05/22/2017   Urinary frequency 02/09/2017   Essential hypertension 01/16/2017   Prostatitis 12/25/2016   Sepsis (HCC) 12/25/2016   GERD without esophagitis 12/25/2016   Sinusitis 12/09/2016   Tendinitis of right rotator cuff 12/09/2016   Laryngopharyngeal reflux (LPR) 02/04/2016   Mass in neck 11/27/2015   Rotator cuff impingement syndrome of left shoulder 06/19/2015   Acromioclavicular joint arthritis 06/19/2015    Current Outpatient Medications  Medication Sig Dispense Refill   amLODipine (NORVASC) 10 MG tablet Take 1 tablet (10 mg total) by mouth daily. 90 tablet 3   atorvastatin (LIPITOR) 20 MG tablet Take 1 tablet (20 mg total) by mouth daily. 90 tablet 1   azithromycin (ZITHROMAX) 250 MG tablet Take 2 tablets on day 1, then 1 tablet daily on days 2 through 5 6 tablet 0   fluticasone furoate-vilanterol (BREO ELLIPTA) 200-25 MCG/ACT AEPB Inhale 1 puff into the lungs daily. 1 each 0   gabapentin (NEURONTIN) 100 MG capsule Take 1 capsule (100 mg total) by mouth 3 (three) times daily. 90 capsule  0   omeprazole (PRILOSEC) 40 MG capsule Take 1 capsule (40 mg total) by mouth daily. 90 capsule 3   valsartan-hydrochlorothiazide (DIOVAN-HCT) 320-25 MG tablet Take 1 tablet by mouth daily. 90 tablet 3   albuterol (VENTOLIN HFA) 108 (90 Base) MCG/ACT inhaler Inhale 2 puffs into the lungs every 6 (six) hours as needed for wheezing or shortness of breath. 18 g 5   No current facility-administered medications for this visit.    Allergies: Silicone, Atarax [hydroxyzine], Coconut oil, and Lisinopril  Past Medical History:  Diagnosis Date   Anxiety    Chicken pox    Depression    Fatty liver    GERD (gastroesophageal reflux disease)    H/O hernia repair 03/31/2022   Hypertension    Migraines    Osteoarthritis of shoulder    left   Rotator cuff impingement syndrome of left shoulder 06/19/2015   Sinusitis    Sleep apnea     Past Surgical History:  Procedure Laterality Date   APPENDECTOMY     CERVICAL LAMINECTOMY     has plates and fusion   CHOLECYSTECTOMY N/A 12/24/2015   Procedure: LAPAROSCOPIC CHOLECYSTECTOMY ;  Surgeon: Gaynelle Adu, MD;  Location: [facility];  Service: General;  Laterality: N/A;   CLAVICLE EXCISION Left    HYPOSPADIAS CORRECTION     as child   KNEE SURGERY Bilateral    multiple   SHOULDER ARTHROSCOPY WITH ROTATOR CUFF REPAIR Left 06/26/2015   Procedure: SHOULDER ARTHROSCOPY WITH ROTATOR CUFF REPAIR;  Surgeon: Salvatore Marvel, MD;  Location: [facility];  Service: Orthopedics;  Laterality: Left;    Family History  Problem Relation Age of Onset   Arthritis Mother    Ovarian cancer Mother    Alcohol abuse Father    Breast cancer Maternal Uncle    Breast cancer Maternal Grandmother    Cancer Maternal Grandmother        spinal surgery   Cancer Other    Hypertension Other    Diabetes Other    Breast cancer Maternal Great-grandfather    Colon cancer Neg Hx    Sleep apnea Neg Hx     Social History   Tobacco Use   Smoking status: Never   Smokeless  tobacco: Current    Types: Chew   Tobacco comments:    form given 11/29/15  Substance Use Topics   Alcohol use: Yes    Alcohol/week: 0.0 standard drinks of alcohol    Comment: occasional    Subjective:   Tested positive for COVID early February; did e-visit last with concerns about persisting cough- was treated with prednisone/Tessalon perles; does not current albuterol inhaler; no fever;  Did not get any relief with prednisone- describes as coughing fits;     Objective:  Vitals:   11/06/23 1346  BP: (!) 142/88  Pulse: (!) 106  Temp: 98.5 F (36.9 C)  TempSrc: Oral  SpO2: 97%  Weight: (!) 328 lb 6.4 oz (149 kg)  Height: 5\' 8"  (1.727 m)    General: Well developed, well nourished, in no acute distress  Skin : Warm and dry.  Head: Normocephalic and atraumatic  Eyes: Sclera and conjunctiva clear; pupils round and reactive to light; extraocular movements intact  Ears: External normal; canals clear; tympanic membranes normal  Oropharynx: Pink, supple. No suspicious lesions  Neck: Supple without thyromegaly, adenopathy  Lungs: Respirations unlabored; clear to auscultation bilaterally without wheeze, rales, rhonchi  CVS exam: normal rate and regular rhythm.  Neurologic: Alert and oriented; speech intact; face symmetrical; moves all extremities well; CNII-XII intact without focal deficit   Assessment:  1. Persistent cough   2. Viral upper respiratory tract infection   3. Acute cough     Plan:  Update CXR today- STAT order placed but at time of writing note results are not back; will treat for possible walking pneumonia; Rx for Z-pak, BREO, albuterol; increase fluids, rest and follow up worse, no better.   No follow-ups on file.  Orders Placed This Encounter  Procedures   DG Chest 2 View    Standing Status:   Future    Number of Occurrences:   1    Expiration Date:   11/05/2024    Reason for Exam (SYMPTOM  OR DIAGNOSIS REQUIRED):   persistent cough    Preferred imaging  location?:   [facility]    Requested Prescriptions   Signed Prescriptions Disp Refills   albuterol (VENTOLIN HFA) 108 (90 Base) MCG/ACT inhaler 18 g 5    Sig: Inhale 2 puffs into the lungs every 6 (six) hours as needed for wheezing or shortness of breath.   azithromycin (ZITHROMAX) 250 MG tablet 6 tablet 0    Sig: Take 2 tablets on day 1, then 1 tablet daily on days 2 through 5   fluticasone furoate-vilanterol (BREO ELLIPTA) 200-25 MCG/ACT AEPB 1 each 0    Sig: Inhale 1 puff into the lungs daily.

## 2023-11-09 ENCOUNTER — Encounter: Payer: Self-pay | Admitting: Family

## 2024-01-21 ENCOUNTER — Telehealth: Payer: Self-pay | Admitting: Family Medicine

## 2024-01-21 NOTE — Telephone Encounter (Signed)
 LVM and sent mychart msg informing pt of need to reschedule 04/14/24 appt - NP out

## 2024-02-17 ENCOUNTER — Other Ambulatory Visit: Payer: Self-pay | Admitting: Family

## 2024-02-18 ENCOUNTER — Other Ambulatory Visit: Payer: Self-pay | Admitting: Family

## 2024-02-18 DIAGNOSIS — R051 Acute cough: Secondary | ICD-10-CM

## 2024-02-18 DIAGNOSIS — J069 Acute upper respiratory infection, unspecified: Secondary | ICD-10-CM

## 2024-03-03 ENCOUNTER — Ambulatory Visit (INDEPENDENT_AMBULATORY_CARE_PROVIDER_SITE_OTHER): Admitting: Family

## 2024-03-03 ENCOUNTER — Ambulatory Visit: Payer: Self-pay | Admitting: Family

## 2024-03-03 VITALS — BP 132/86 | HR 90 | Ht 68.0 in | Wt 328.0 lb

## 2024-03-03 DIAGNOSIS — R200 Anesthesia of skin: Secondary | ICD-10-CM | POA: Diagnosis not present

## 2024-03-03 DIAGNOSIS — R079 Chest pain, unspecified: Secondary | ICD-10-CM | POA: Diagnosis not present

## 2024-03-03 DIAGNOSIS — R202 Paresthesia of skin: Secondary | ICD-10-CM

## 2024-03-03 DIAGNOSIS — R7309 Other abnormal glucose: Secondary | ICD-10-CM

## 2024-03-03 LAB — COMPREHENSIVE METABOLIC PANEL WITH GFR
ALT: 32 U/L (ref 0–53)
AST: 23 U/L (ref 0–37)
Albumin: 4.7 g/dL (ref 3.5–5.2)
Alkaline Phosphatase: 88 U/L (ref 39–117)
BUN: 10 mg/dL (ref 6–23)
CO2: 29 meq/L (ref 19–32)
Calcium: 9.5 mg/dL (ref 8.4–10.5)
Chloride: 100 meq/L (ref 96–112)
Creatinine, Ser: 0.9 mg/dL (ref 0.40–1.50)
GFR: 100.76 mL/min (ref >60.00)
Glucose, Bld: 90 mg/dL (ref 70–99)
Potassium: 3.9 meq/L (ref 3.5–5.1)
Sodium: 138 meq/L (ref 135–145)
Total Bilirubin: 0.5 mg/dL (ref 0.2–1.2)
Total Protein: 7.1 g/dL (ref 6.0–8.3)

## 2024-03-03 LAB — CBC WITH DIFFERENTIAL/PLATELET
Basophils Absolute: 0 10*3/uL (ref 0.0–0.1)
Basophils Relative: 0.5 % (ref 0.0–3.0)
Eosinophils Absolute: 0 10*3/uL (ref 0.0–0.7)
Eosinophils Relative: 0.4 % (ref 0.0–5.0)
HCT: 41.2 % (ref 39.0–52.0)
Hemoglobin: 13.8 g/dL (ref 13.0–17.0)
Lymphocytes Relative: 30.5 % (ref 12.0–46.0)
Lymphs Abs: 2.4 10*3/uL (ref 0.7–4.0)
MCHC: 33.4 g/dL (ref 30.0–36.0)
MCV: 86.7 fl (ref 78.0–100.0)
Monocytes Absolute: 0.5 10*3/uL (ref 0.1–1.0)
Monocytes Relative: 6.3 % (ref 3.0–12.0)
Neutro Abs: 5 10*3/uL (ref 1.4–7.7)
Neutrophils Relative %: 62.3 % (ref 43.0–77.0)
Platelets: 337 10*3/uL (ref 150.0–400.0)
RBC: 4.75 Mil/uL (ref 4.22–5.81)
RDW: 13.3 % (ref 11.5–15.5)
WBC: 8 10*3/uL (ref 4.0–10.5)

## 2024-03-03 LAB — VITAMIN B12: Vitamin B-12: 453 pg/mL (ref 211–911)

## 2024-03-03 LAB — MAGNESIUM: Magnesium: 2 mg/dL (ref 1.5–2.5)

## 2024-03-03 LAB — HEMOGLOBIN A1C: Hgb A1c MFr Bld: 5.7 % (ref 4.6–6.5)

## 2024-03-03 NOTE — Patient Instructions (Signed)
 Please follow up with your back specialist about the symptoms in your lower extremities;  Your EKG is normal here but we will go ahead and refer you to cardiology.

## 2024-03-03 NOTE — Progress Notes (Signed)
 Joshua Parrish is a 48 y.o. male with the following history as recorded in Epic[employer]:  Patient Active Problem List   Diagnosis Date Noted   COVID-19 10/09/2023   Suicidal ideations 12/19/2022   Suicidal ideation 12/18/2022   Major depressive disorder, recurrent episode, severe (HCC) 11/13/2022   Alcohol abuse 11/13/2022   Acute drug overdose, intentional self-harm, initial encounter (HCC) 11/12/2022   OD (overdose of drug), intentional self-harm, initial encounter (HCC) 11/11/2022   Insomnia disorder, with other sleep disorder, recurrent 11/11/2022   RSV bronchitis 08/14/2022   Chronic gout 06/13/2022   Abdominal pain 02/16/2022   Gait abnormality 01/09/2022   Morbid obesity with BMI of 45.0-49.9, adult (HCC) 01/09/2022   OSA on CPAP 01/09/2022   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   Low back pain 01/09/2022   Rotator cuff disorder, left 12/25/2017   Bilateral shoulder pain 05/22/2017   Left knee pain 05/22/2017   Urinary frequency 02/09/2017   Essential hypertension 01/16/2017   Prostatitis 12/25/2016   Sepsis (HCC) 12/25/2016   GERD without esophagitis 12/25/2016   Sinusitis 12/09/2016   Tendinitis of right rotator cuff 12/09/2016   Laryngopharyngeal reflux (LPR) 02/04/2016   Mass in neck 11/27/2015   Rotator cuff impingement syndrome of left shoulder 06/19/2015   Acromioclavicular joint arthritis 06/19/2015    Current Outpatient Medications  Medication Sig Dispense Refill   albuterol  (VENTOLIN  HFA) 108 (90 Base) MCG/ACT inhaler Inhale 2 puffs into the lungs every 6 (six) hours as needed for wheezing or shortness of breath. 18 g 5   amLODipine  (NORVASC ) 10 MG tablet Take 1 tablet (10 mg total) by mouth daily. 90 tablet 3   atorvastatin  (LIPITOR) 20 MG tablet Take 1 tablet (20 mg total) by mouth daily. 90 tablet 0   fluticasone  furoate-vilanterol (BREO ELLIPTA ) 200-25 MCG/ACT AEPB Inhale 1 puff into the lungs daily. 1 each 0   gabapentin  (NEURONTIN ) 100 MG capsule Take 1 capsule (100 mg total)  by mouth 3 (three) times daily. 90 capsule 0   omeprazole  (PRILOSEC) 40 MG capsule Take 1 capsule (40 mg total) by mouth daily. 90 capsule 3   valsartan -hydrochlorothiazide  (DIOVAN -HCT) 320-25 MG tablet Take 1 tablet by mouth daily. 90 tablet 3   No current facility-administered medications for this visit.    Allergies: Silicone, Atarax  [hydroxyzine ], Coconut oil, and Lisinopril   Past Medical History:  Diagnosis Date   Anxiety    Chicken pox    Depression    Fatty liver    GERD (gastroesophageal reflux disease)    H/O hernia repair 03/31/2022   Hypertension    Migraines    Osteoarthritis of shoulder    left   Rotator cuff impingement syndrome of left shoulder 06/19/2015   Sinusitis    Sleep apnea     Past Surgical History:  Procedure Laterality Date   APPENDECTOMY     CERVICAL LAMINECTOMY     has plates and fusion   CHOLECYSTECTOMY N/A 12/24/2015   Procedure: LAPAROSCOPIC CHOLECYSTECTOMY ;  Surgeon: Camellia Blush, MD;  Location: [facility];  Service: General;  Laterality: N/A;   CLAVICLE EXCISION Left    HYPOSPADIAS CORRECTION     as child   KNEE SURGERY Bilateral    multiple   SHOULDER ARTHROSCOPY WITH ROTATOR CUFF REPAIR Left 06/26/2015   Procedure: SHOULDER ARTHROSCOPY WITH ROTATOR CUFF REPAIR;  Surgeon: Lamar Millman, MD;  Location: [facility];  Service: Orthopedics;  Laterality: Left;    Family History  Problem Relation Age of Onset   Arthritis Mother  Ovarian cancer Mother    Alcohol abuse Father    Breast cancer Maternal Uncle    Breast cancer Maternal Grandmother    Cancer Maternal Grandmother        spinal surgery   Cancer Other    Hypertension Other    Diabetes Other    Breast cancer Maternal Great-grandfather    Colon cancer Neg Hx    Sleep apnea Neg Hx     Social History   Tobacco Use   Smoking status: Never   Smokeless tobacco: Current    Types: Chew   Tobacco comments:    form given 11/29/15  Substance Use Topics   Alcohol use: Yes     Alcohol/week: 0.0 standard drinks of alcohol    Comment: occasional    Subjective:   Patient notes he has been having intermittent episodes of chest pain-  I hit my chest to try and make it stop and then the arm feels numb; denies any chest pain or shortness of breath with exertion; describes as sharp pain- like someone just stabbed me; Notes symptoms have been present for the past month;   Has also been having some numbness radiating down his left leg- known problems with low back/ had ablation/ steroid injections done in the past year; under care of orthopedist at [facility]; overdue for follow up there;       Objective:  Vitals:   03/03/24 1059  BP: 132/86  Pulse: 90  SpO2: 99%  Weight: (!) 328 lb (148.8 kg)  Height: 5' 8 (1.727 m)    General: Well developed, well nourished, in no acute distress  Skin : Warm and dry.  Head: Normocephalic and atraumatic  Eyes: Sclera and conjunctiva clear; pupils round and reactive to light; extraocular movements intact  Ears: External normal; canals clear; tympanic membranes normal  Oropharynx: Pink, supple. No suspicious lesions  Neck: Supple without thyromegaly, adenopathy  Lungs: Respirations unlabored; clear to auscultation bilaterally without wheeze, rales, rhonchi  CVS exam: normal rate and regular rhythm.  Neurologic: Alert and oriented; speech intact; face symmetrical; moves all extremities well; CNII-XII intact without focal deficit   Assessment:  1. Chest pain, unspecified type   2. Numbness and tingling of left leg   3. Elevated glucose     Plan:  Atypical presentation- no chest pain on exertion; EKG shows normal sinus rhythm; refer to cardiology for further evaluation;  Suspect related to chronic back issues- he will follow up with his orthopedist for further evaluation; will check B12, CBC, CMP today;  Check Hgba1c today;   No follow-ups on file.  Orders Placed This Encounter  Procedures   CBC with  Differential/Platelet   Comp Met (CMET)   Hemoglobin A1c   B12   Magnesium    Ambulatory referral to Cardiology    Referral Priority:   Routine    Referral Type:   Consultation    Referral Reason:   Specialty Services Required    Number of Visits Requested:   1   EKG 12-Lead    Requested Prescriptions    No prescriptions requested or ordered in this encounter

## 2024-04-14 ENCOUNTER — Ambulatory Visit: Payer: Managed Care, Other (non HMO) | Admitting: Family Medicine

## 2024-04-30 ENCOUNTER — Telehealth: Admitting: Physician Assistant

## 2024-04-30 DIAGNOSIS — M109 Gout, unspecified: Secondary | ICD-10-CM

## 2024-04-30 MED ORDER — PREDNISONE 20 MG PO TABS: 40.0000 mg | ORAL_TABLET | Freq: Every day | ORAL | 0 refills | Status: DC

## 2024-04-30 NOTE — Progress Notes (Signed)
 I have spent 5 minutes in review of e-visit questionnaire, review and updating patient chart, medical decision making and response to patient.   Elsie Velma Lunger, PA-C

## 2024-04-30 NOTE — Progress Notes (Signed)
E-Visit for Gout Symptoms  We are sorry that you are not feeling well. We are here to help!  Based on what you shared with me it looks like you have a flare of your gout.  Gout is a form of arthritis. It can cause pain and swelling in the joints. At first, it tends to affect only 1 joint - most frequently the big toe. It happens in people who have too much uric acid in the blood. Uric acid is a chemical that is produced when the body breaks down certain foods. Uric acid can form sharp needle-like crystals that build up in the joints and cause pain. Uric acid crystals can also form inside the tubes that carry urine from the kidneys to the bladder. These crystals can turn into "kidney stones" that can cause pain and problems with the flow of urine. People with gout get sudden "flares" or attacks of severe pain, most often the big toe, ankle, or knee. Often the joint also turns red and swells. Usually, only 1 joint is affected, but some people have pain in more than 1 joint. Gout flares tend to happen more often during the night.  The pain from gout can be extreme. The pain and swelling are worst at the beginning of a gout flare. The symptoms then get better within a few days to weeks. It is not clear how the body "turns off" a gout flare.  Do not start any NEW preventative medicine until the gout has cleared completely. However, If you are already on Probenecid or Allopurinol for CHRONIC gout, you may continue taking this during an active flare up  I have prescribed Prednisone 40 mg daily for 7 days.    HOME CARE Losing weight can help relieve gout. It's not clear that following a specific diet plan will help with gout symptoms but eating a balanced diet can help improve your overall health. It can also help you lose weight, if you are overweight. In general, a healthy diet includes plenty of fruits, vegetables, whole grains, and low-fat dairy products (labelled "low fat", skim, 2%). Avoid sugar  sweetened drinks (including sodas, tea, juice and juice blends, coffee drinks and sports drinks) Limit alcohol to 1-2 drinks of beer, spirits or wine daily these can make gout flares worse. Some people with gout also have other health problems, such as heart disease, high blood pressure, kidney disease, or obesity. If you have any of these issues, it's important to work with your doctor to manage them. This can help improve your overall health and might also help with your gout.  GET HELP RIGHT AWAY IF: Your symptoms persist after you have completed your treatment plan You develop severe diarrhea You develop abnormal sensations  You develop vomiting,   You develop weakness  You develop abdominal pain  FOLLOW UP WITH YOUR PRIMARY PROVIDER IF: If your symptoms do not improve within 10 days  MAKE SURE YOU  Understand these instructions. Will watch your condition. Will get help right away if you are not doing well or get worse.  Thank you for choosing an e-visit.  Your e-visit answers were reviewed by a board certified advanced clinical practitioner to complete your personal care plan. Depending upon the condition, your plan could have included both over the counter or prescription medications.  Please review your pharmacy choice. Make sure the pharmacy is open so you can pick up prescription now. If there is a problem, you may contact your provider through MyChart messaging   and have the prescription routed to another pharmacy.  Your safety is important to us. If you have drug allergies check your prescription carefully.   For the next 24 hours you can use MyChart to ask questions about today's visit, request a non-urgent call back, or ask for a work or school excuse. You will get an email in the next two days asking about your experience. I hope that your e-visit has been valuable and will speed your recovery.  

## 2024-05-07 ENCOUNTER — Telehealth: Admitting: Family

## 2024-05-07 DIAGNOSIS — M109 Gout, unspecified: Secondary | ICD-10-CM

## 2024-05-07 MED ORDER — COLCHICINE 0.6 MG PO TABS: ORAL_TABLET | ORAL | 0 refills | Status: DC

## 2024-05-07 MED ORDER — PREDNISONE 20 MG PO TABS: 40.0000 mg | ORAL_TABLET | Freq: Every day | ORAL | 0 refills | Status: AC

## 2024-05-07 NOTE — Progress Notes (Signed)
 E-Visit for Gout Symptoms  We are sorry that you are not feeling well. We are here to help!  Based on what you shared with me it looks like you have a flare of your gout.  Gout is a form of arthritis. It can cause pain and swelling in the joints. At first, it tends to affect only 1 joint - most frequently the big toe. It happens in people who have too much uric acid in the blood. Uric acid is a chemical that is produced when the body breaks down certain foods. Uric acid can form sharp needle-like crystals that build up in the joints and cause pain. Uric acid crystals can also form inside the tubes that carry urine from the kidneys to the bladder. These crystals can turn into kidney stones that can cause pain and problems with the flow of urine. People with gout get sudden flares or attacks of severe pain, most often the big toe, ankle, or knee. Often the joint also turns red and swells. Usually, only 1 joint is affected, but some people have pain in more than 1 joint. Gout flares tend to happen more often during the night.  The pain from gout can be extreme. The pain and swelling are worst at the beginning of a gout flare. The symptoms then get better within a few days to weeks. It is not clear how the body turns off a gout flare.  Do not start any NEW preventative medicine until the gout has cleared completely. However, If you are already on Probenecid or Allopurinol  for CHRONIC gout, you may continue taking this during an active flare up  I have prescribed Colchicine  0.6 mg tabs - Take 2 tabs immediately, then 1 tab twice per day for the duration of the flare up to a max of 7 days (but discontinue for stomach pains or diarrhea)  and I have prescribed Prednisone  40 mg daily for 7   HOME CARE Losing weight can help relieve gout. It's not clear that following a specific diet plan will help with gout symptoms but eating a balanced diet can help improve your overall health. It can also help you  lose weight, if you are overweight. In general, a healthy diet includes plenty of fruits, vegetables, whole grains, and low-fat dairy products (labelled "low fat", skim, 2%). Avoid sugar sweetened drinks (including sodas, tea, juice and juice blends, coffee drinks and sports drinks) Limit alcohol to 1-2 drinks of beer, spirits or wine daily these can make gout flares worse. Some people with gout also have other health problems, such as heart disease, high blood pressure, kidney disease, or obesity. If you have any of these issues, it's important to work with your doctor to manage them. This can help improve your overall health and might also help with your gout.  GET HELP RIGHT AWAY IF: Your symptoms persist after you have completed your treatment plan You develop severe diarrhea You develop abnormal sensations  You develop vomiting,   You develop weakness  You develop abdominal pain  FOLLOW UP WITH YOUR PRIMARY PROVIDER IF: If your symptoms do not improve within 10 days  MAKE SURE YOU  Understand these instructions. Will watch your condition. Will get help right away if you are not doing well or get worse.  Thank you for choosing an e-visit.  Your e-visit answers were reviewed by a board certified advanced clinical practitioner to complete your personal care plan. Depending upon the condition, your plan could have included both  over the counter or prescription medications.  Please review your pharmacy choice. Make sure the pharmacy is open so you can pick up prescription now. If there is a problem, you may contact your provider through [employer] and have the prescription routed to another pharmacy.  Your safety is important to us . If you have drug allergies check your prescription carefully.   For the next 24 hours you can use MyChart to ask questions about today's visit, request a non-urgent call back, or ask for a work or school excuse. You will get an email in the next two days  asking about your experience. I hope that your e-visit has been valuable and will speed your recovery.

## 2024-05-07 NOTE — Progress Notes (Signed)
Approximately 5 minutes was spent documenting and reviewing patient's chart.

## 2024-05-15 ENCOUNTER — Other Ambulatory Visit: Payer: Self-pay | Admitting: Family

## 2024-05-28 ENCOUNTER — Telehealth: Admitting: Family

## 2024-05-28 DIAGNOSIS — M5442 Lumbago with sciatica, left side: Secondary | ICD-10-CM

## 2024-05-28 DIAGNOSIS — R200 Anesthesia of skin: Secondary | ICD-10-CM

## 2024-05-28 NOTE — Progress Notes (Signed)
  Because of your back pain and hand numbness, I feel your condition warrants further evaluation and I recommend that you be seen in a face-to-face visit. Usually back pain does not cause hand numbness.    NOTE: There will be NO CHARGE for this E-Visit   If you are having a true medical emergency, please call 911.     For an urgent face to face visit, [facility] has multiple urgent care centers for your convenience.  Click the link below for the full list of locations and hours, walk-in wait times, appointment scheduling options and driving directions:  Urgent Care - [facility], [place], [place], [place], [place], [place]  [place]     Your MyChart E-visit questionnaire answers were reviewed by a board certified advanced clinical practitioner to complete your personal care plan based on your specific symptoms.    Thank you for using e-Visits.

## 2024-06-09 ENCOUNTER — Encounter: Payer: Self-pay | Admitting: Family Medicine

## 2024-06-09 ENCOUNTER — Ambulatory Visit (INDEPENDENT_AMBULATORY_CARE_PROVIDER_SITE_OTHER): Admitting: Family Medicine

## 2024-06-09 DIAGNOSIS — M109 Gout, unspecified: Secondary | ICD-10-CM | POA: Diagnosis not present

## 2024-06-09 MED ORDER — GABAPENTIN 300 MG PO CAPS: 300.0000 mg | ORAL_CAPSULE | Freq: Three times a day (TID) | ORAL | 5 refills | Status: AC | PRN

## 2024-06-09 MED ORDER — COLCHICINE 0.6 MG PO TABS: 0.6000 mg | ORAL_TABLET | Freq: Every day | ORAL | 4 refills | Status: DC

## 2024-06-09 MED ORDER — ALLOPURINOL 300 MG PO TABS: 300.0000 mg | ORAL_TABLET | Freq: Every day | ORAL | 3 refills | Status: DC

## 2024-06-09 NOTE — Progress Notes (Unsigned)
   I, Leotis Batter, CMA acting as a scribe for Artist Lloyd, MD.  Joshua Parrish is a 48 y.o. male who presents to [facility] at [facility] today for possible gout flare. Pt was last seen by Dr. Lloyd on 07/15/22 and labs were checked.  Today, pt reports great toe pain off an on. Sx have responded well to meds in the past but PCP will no longer prescribe. Has been on Allopurinol  and Colchicine  in the past.   Since his last visit he had a suicide attempt by taking pills.  He is feeling better now but it was a scary time.  He will establish with his new PCP in about a month.  Dx testing: 07/15/22 L knee MRI & labs 06/13/22 L knee XR & labs   Pertinent review of systems: No fevers or chills  Relevant historical information: Hypertension.   Exam:  BP 130/84   Pulse 77   Ht 5' 8 (1.727 m)   Wt (!) 331 lb (150.1 kg)   SpO2 100%   BMI 50.33 kg/m  General: Well Developed, well nourished, and in no acute distress.   MSK: Right great toe mildly tender to palpation.       Assessment and Plan: 48 y.o. male with gout exacerbation.  His allopurinol  and colchicine  was stopped while in the hospital following a suicide attempt.  He has had more gout attacks since.  Will go ahead and restart both allopurinol  colchicine  at previous dose and plan to check labs again in about a month or 2.  Addition I did refill gabapentin .   PDMP not reviewed this encounter. No orders of the defined types were placed in this encounter.  Meds ordered this encounter  Medications   colchicine  0.6 MG tablet    Sig: Take 1 tablet (0.6 mg total) by mouth daily.    Dispense:  30 tablet    Refill:  4   allopurinol  (ZYLOPRIM ) 300 MG tablet    Sig: Take 1 tablet (300 mg total) by mouth daily.    Dispense:  90 tablet    Refill:  3   gabapentin  (NEURONTIN ) 300 MG capsule    Sig: Take 1 capsule (300 mg total) by mouth 3 (three) times daily as needed.    Dispense:  90 capsule    Refill:  5      Discussed warning signs or symptoms. Please see discharge instructions. Patient expresses understanding.   The above documentation has been reviewed and is accurate and complete Artist Lloyd, M.D.

## 2024-06-09 NOTE — Patient Instructions (Signed)
 Thank you for coming in today.   Colchicine , Allopurinol , and Gabapentin  refilled today.   See you back in 2 months.

## 2024-06-30 ENCOUNTER — Telehealth: Admitting: Physician Assistant

## 2024-06-30 DIAGNOSIS — J4540 Moderate persistent asthma, uncomplicated: Secondary | ICD-10-CM

## 2024-06-30 MED ORDER — ALBUTEROL SULFATE HFA 108 (90 BASE) MCG/ACT IN AERS: 1.0000 | INHALATION_SPRAY | Freq: Four times a day (QID) | RESPIRATORY_TRACT | 0 refills | Status: AC | PRN

## 2024-06-30 MED ORDER — ALBUTEROL SULFATE HFA 108 (90 BASE) MCG/ACT IN AERS: 1.0000 | INHALATION_SPRAY | Freq: Four times a day (QID) | RESPIRATORY_TRACT | 0 refills | Status: DC | PRN

## 2024-06-30 NOTE — Progress Notes (Signed)
 E Visit for Asthma  Based on what you have shared with me, it looks like you may have a flare up of your asthma.  Asthma is a chronic (ongoing) lung disease which results in airway obstruction, inflammation and hyper-responsiveness.   Asthma symptoms vary from person to person, with common symptoms including nighttime awakening and decreased ability to participate in normal activities due to shortness of breath. It is often triggered by changes in weather, changes in the season, changes in air temperature, or inside (home, school, daycare or work) allergens such as animal dander, mold, mildew, woodstoves or cockroaches.   It can also be triggered by hormonal changes, extreme emotion, physical exertion or an upper respiratory tract illness.     It is important to identify the trigger and eliminate or avoid the trigger if possible.   If you have been prescribed medications to be taken on a regular basis, it is important to follow the asthma action plan and to follow guidelines to adjust medication in response to increasing symptoms of decreased peak expiratory flow rate.  Treatment: I have prescribed: Albuterol  (Proventil  HFA; Ventolin  HFA) 108 (90 Base) MCG/ACT Inhaler 2 puffs into the lungs every six hours as needed for wheezing or shortness of breath  HOME CARE Only take medications as instructed by your medical team. Consider wearing a mask or scarf to improve breathing when there is poor air quality as this has been shown to decrease irritation and decrease exacerbations Get rest. Using a humidifier may help nasal congestion and ease sore throat pain. Using a saline nasal spray works much the same way.  Cough drops, hard candies and sore throat lozenges may ease your cough.  Avoid close contacts, especially the very you and the elderly. Cover your mouth if you cough or  sneeze. Always remember to wash your hands.   GET HELP RIGHT AWAY IF: You develop worsening shortness of breath/difficulty breathing or chest tightness, breathlessness at rest, drowsy, confused or agitated, unable to speak in full sentences, or if you develop chest pain.  You have coughing fits. You develop a severe headache or visual changes. You develop shortness of breath, difficulty breathing or start having chest pain. Your symptoms persist after you have completed your treatment plan. If your symptoms do not improve within 5 days.  MAKE SURE YOU Understand these instructions. Will watch your condition. Will get help right away if you are not doing well or get worse.   Your e-visit answers were reviewed by a board certified advanced clinical practitioner to complete your personal care plan, Depending upon the condition, your plan could have included both over the counter or prescription medications.  Please review your pharmacy choice. Your safety is important to us . If you have drug allergies check your prescription carefully. You can use MyChart to ask questions about today's visit, request a non-urgent call back, or ask for a work or school excuse for 24 hours related to this e-Visit. If it has been greater than 24 hours you will need to follow up with your provider, or enter a new e-Visit to address those concerns.  You will get an e-mail in the next two days asking about your experience. I hope that your e-visit has been valuable and will speed your recovery. Thank you for using e-visits.  I have spent 5 minutes in review of e-visit questionnaire, review and updating patient chart, medical decision making and response to patient.   Delon CHRISTELLA Dickinson, PA-C

## 2024-06-30 NOTE — Addendum Note (Signed)
 Addended by: VIVIENNE DELON HERO on: 06/30/2024 06:03 PM   Modules accepted: Orders

## 2024-07-13 ENCOUNTER — Telehealth: Payer: Self-pay | Admitting: Family

## 2024-07-13 NOTE — Telephone Encounter (Signed)
 Patient requesting TOC from Joshua Parrish to [facility]. Please advise.

## 2024-07-14 NOTE — Telephone Encounter (Signed)
 would like to decline this TOC please

## 2024-07-18 ENCOUNTER — Telehealth: Payer: Self-pay | Admitting: Family

## 2024-07-18 NOTE — Telephone Encounter (Signed)
 LVM appointment 08/03/24 cancelled-Provider unable to take TOC at this time

## 2024-08-03 ENCOUNTER — Encounter: Admitting: Family

## 2024-08-09 ENCOUNTER — Ambulatory Visit: Admitting: Family Medicine

## 2024-08-09 VITALS — BP 130/82 | HR 94 | Ht 68.0 in | Wt 338.0 lb

## 2024-08-09 DIAGNOSIS — Z8739 Personal history of other diseases of the musculoskeletal system and connective tissue: Secondary | ICD-10-CM

## 2024-08-09 LAB — BASIC METABOLIC PANEL WITH GFR
BUN: 11 mg/dL (ref 6–23)
CO2: 27 meq/L (ref 19–32)
Calcium: 9.5 mg/dL (ref 8.4–10.5)
Chloride: 99 meq/L (ref 96–112)
Creatinine, Ser: 0.88 mg/dL (ref 0.40–1.50)
GFR: 101.13 mL/min (ref >60.00)
Glucose, Bld: 83 mg/dL (ref 70–99)
Potassium: 3.6 meq/L (ref 3.5–5.1)
Sodium: 136 meq/L (ref 135–145)

## 2024-08-09 LAB — URIC ACID: Uric Acid, Serum: 5.5 mg/dL (ref 4.0–7.8)

## 2024-08-09 NOTE — Patient Instructions (Addendum)
 Thank you for coming in today.   Please get labs today before you leave   Check back as needed

## 2024-08-09 NOTE — Progress Notes (Unsigned)
   LILLETTE Ileana Collet, PhD, LAT, ATC acting as a scribe for Artist Lloyd, MD.  Joshua Parrish is a 48 y.o. male who presents to [facility] at [facility] today for f/u Pt was previously seen by Dr. Lloyd on 06/09/24 for a gout flare in his Great toe and was advised to re-start allopurinol  and colchicine . Gabapentin  refilled.  Today, pt reports his visit w/ a new PCP was canceled. He now is r/s to see Dr. Kennyth next week to establish care. No current gout flares to note today.   Pertinent review of systems: ***  Relevant historical information: ***   Exam:  There were no vitals taken for this visit. General: Well Developed, well nourished, and in no acute distress.   MSK: ***    Lab and Radiology Results No results found for this or any previous visit (from the past 72 hours). No results found.     Assessment and Plan: 48 y.o. male with ***   PDMP not reviewed this encounter. No orders of the defined types were placed in this encounter.  No orders of the defined types were placed in this encounter.    Discussed warning signs or symptoms. Please see discharge instructions. Patient expresses understanding.   ***

## 2024-08-10 ENCOUNTER — Ambulatory Visit: Payer: Self-pay | Admitting: Family Medicine

## 2024-08-10 DIAGNOSIS — M109 Gout, unspecified: Secondary | ICD-10-CM

## 2024-08-10 MED ORDER — COLCHICINE 0.6 MG PO TABS: 0.6000 mg | ORAL_TABLET | Freq: Every day | ORAL | 4 refills | Status: AC

## 2024-08-10 MED ORDER — ALLOPURINOL 300 MG PO TABS: 300.0000 mg | ORAL_TABLET | Freq: Every day | ORAL | 3 refills | Status: AC

## 2024-08-10 NOTE — Progress Notes (Signed)
 Uric acid level looks great.  Kidney function looks okay.  Plan to continue allopurinol .  Use colchicine  for another month or 2 then switch to just as needed. Refill sent to [facility] in [facility]

## 2024-08-15 ENCOUNTER — Ambulatory Visit (INDEPENDENT_AMBULATORY_CARE_PROVIDER_SITE_OTHER): Admitting: Family Medicine

## 2024-08-15 ENCOUNTER — Encounter: Payer: Self-pay | Admitting: Family Medicine

## 2024-08-15 VITALS — BP 132/80 | HR 96 | Ht 68.0 in

## 2024-08-15 DIAGNOSIS — G43809 Other migraine, not intractable, without status migrainosus: Secondary | ICD-10-CM

## 2024-08-15 DIAGNOSIS — M5430 Sciatica, unspecified side: Secondary | ICD-10-CM

## 2024-08-15 DIAGNOSIS — E785 Hyperlipidemia, unspecified: Secondary | ICD-10-CM

## 2024-08-15 DIAGNOSIS — M109 Gout, unspecified: Secondary | ICD-10-CM | POA: Insufficient documentation

## 2024-08-15 DIAGNOSIS — N419 Inflammatory disease of prostate, unspecified: Secondary | ICD-10-CM | POA: Diagnosis not present

## 2024-08-15 DIAGNOSIS — Z23 Encounter for immunization: Secondary | ICD-10-CM

## 2024-08-15 DIAGNOSIS — K219 Gastro-esophageal reflux disease without esophagitis: Secondary | ICD-10-CM

## 2024-08-15 DIAGNOSIS — I10 Essential (primary) hypertension: Secondary | ICD-10-CM

## 2024-08-15 DIAGNOSIS — G43909 Migraine, unspecified, not intractable, without status migrainosus: Secondary | ICD-10-CM | POA: Insufficient documentation

## 2024-08-15 DIAGNOSIS — G4733 Obstructive sleep apnea (adult) (pediatric): Secondary | ICD-10-CM

## 2024-08-15 DIAGNOSIS — F339 Major depressive disorder, recurrent, unspecified: Secondary | ICD-10-CM | POA: Diagnosis not present

## 2024-08-15 DIAGNOSIS — J329 Chronic sinusitis, unspecified: Secondary | ICD-10-CM | POA: Diagnosis not present

## 2024-08-15 MED ORDER — VALSARTAN-HYDROCHLOROTHIAZIDE 320-25 MG PO TABS: 1.0000 | ORAL_TABLET | Freq: Every day | ORAL | 3 refills | Status: AC

## 2024-08-15 MED ORDER — ZEPBOUND 2.5 MG/0.5ML ~~LOC~~ SOAJ: 2.5000 mg | SUBCUTANEOUS | 0 refills | Status: DC

## 2024-08-15 MED ORDER — AMLODIPINE BESYLATE 10 MG PO TABS: 10.0000 mg | ORAL_TABLET | Freq: Every day | ORAL | 3 refills | Status: AC

## 2024-08-15 MED ORDER — AZITHROMYCIN 250 MG PO TABS: ORAL_TABLET | ORAL | 0 refills | Status: DC

## 2024-08-15 NOTE — Assessment & Plan Note (Signed)
 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  Usually receives Bactrim  as needed for flareups.  Not currently having any symptoms though he will let us  know if he develops any issues between now and our next visit.

## 2024-08-15 NOTE — Assessment & Plan Note (Signed)
 Mood very well-controlled.  He did have a suicide attempt last year and has seen psychiatry however he is not currently on any medications and is doing very well.  He has stress management techniques that he is working on at home.  He will let us  know if he needs any further assistance with this or if he does notice any worsening mood

## 2024-08-15 NOTE — Assessment & Plan Note (Signed)
 Blood pressure at goal today on amlodipine  10 mg daily and valsartan  HCTZ 325-25 once daily.

## 2024-08-15 NOTE — Assessment & Plan Note (Signed)
 On CPAP though we are starting Zepbound  as above.

## 2024-08-15 NOTE — Assessment & Plan Note (Signed)
 Follows with sports medicine for this.  On allopurinol  300 mg daily.  We can check uric acid level at next office visit.  Also has colchicine  to use as needed.

## 2024-08-15 NOTE — Assessment & Plan Note (Signed)
 Stable on omeprazole 40 mg daily

## 2024-08-15 NOTE — Patient Instructions (Signed)
 It was very nice to see you today!  VISIT SUMMARY: During your visit, we discussed your concerns about weight management and medication refills. We addressed your weight gain, sleep apnea, hypertension, hyperlipidemia, GERD, chronic pain, depression, sinusitis, migraines, and prostatitis. We also reviewed your general health maintenance and recommended vaccines and screenings.  YOUR PLAN: MORBID OBESITY: You have been struggling with weight management despite diet and exercise. -Zepbound  is prescribed for weight loss and sleep apnea management, pending insurance approval. Please report on its efficacy in 3-4 weeks. -Continue with dietary modifications and exercise regimen.  OBSTRUCTIVE SLEEP APNEA: Your sleep apnea may improve with weight loss. -Zepbound  is prescribed for managing sleep apnea, pending insurance approval.  ESSENTIAL HYPERTENSION: Your blood pressure is well-controlled with current medication. -Continue taking amlodipine  10 mg daily and valsartan  HCT 320/25 mg daily.  DYSLIPIDEMIA: Your cholesterol levels are managed with atorvastatin . -Continue taking atorvastatin  20 mg daily.  GOUT: Your gout is managed with the current medication regimen. -Continue the current regimen.  GASTROESOPHAGEAL REFLUX DISEASE: Your GERD is managed with omeprazole . -Continue taking omeprazole  as prescribed.  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 -Continue taking gabapentin  as needed for nerve pain.  MAJOR DEPRESSIVE DISORDER, RECURRENT: Your mood is stable with stress management techniques. -Continue using stress management techniques and reach out if depressive symptoms recur.  RECURRENT SINUSITIS: You have symptoms suggesting sinusitis. -Azithromycin  (Z-Pak) is prescribed for sinusitis.  MIGRAINE: You experience migraines approximately twice a month. -Continue managing migraines with Excedrin Migraine and Nyquil as  needed.  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 -Contact us  if symptoms recur for potential antibiotic treatment.  GENERAL HEALTH MAINTENANCE: Routine health maintenance was discussed. -A flu shot and pneumonia vaccine were administered. -A colonoscopy is scheduled for next year.  Return in about 3 months (around 11/13/2024) for Annual Physical.   Take care, Dr Kennyth  PLEASE NOTE:  If you had any lab tests, please let us  know if you have not heard back within a few days. You may see your results on mychart before we have a chance to review them but we will give you a call once they are reviewed by us .   If we ordered any referrals today, please let us  know if you have not heard from their office within the next week.   If you had any urgent prescriptions sent in today, please check with the pharmacy within an hour of our visit to make sure the prescription was transmitted appropriately.   Please try these tips to maintain a healthy lifestyle:  Eat at least 3 REAL meals and 1-2 snacks per day.  Aim for no more than 5 hours between eating.  If you eat breakfast, please do so within one hour of getting up.   Each meal should contain half fruits/vegetables, one quarter protein, and one quarter carbs (no bigger than a computer mouse)  Cut down on sweet beverages. This includes juice, soda, and sweet tea.   Drink at least 1 glass of water with each meal and aim for at least 8 glasses per day  Exercise at least 150 minutes every week.

## 2024-08-15 NOTE — Assessment & Plan Note (Signed)
 Overall him's are manageable on Excedrin as needed.  Usually takes this about twice per month.  Do not think he needs any preventative medication at this point though he will let us  know if he has any change or worsening symptoms.

## 2024-08-15 NOTE — Assessment & Plan Note (Signed)
 On Lipitor 20 mg daily.  Will continue this.  Recheck lipids in 3 months.

## 2024-08-15 NOTE — Assessment & Plan Note (Signed)
 Patient with current symptoms suggestive of sinusitis.  This has been a recurrent issue for him at least once or twice per year.  No red flag signs or symptoms.  Will send a pocket prescription for azithromycin  as he typically does well with this.

## 2024-08-15 NOTE — Assessment & Plan Note (Signed)
 Follows with sports medicine for this as well and takes gabapentin  as needed.

## 2024-08-15 NOTE — Progress Notes (Signed)
 Joshua Parrish is a 48 y.o. male who presents today for an office visit.  Assessment/Plan:   Chronic Problems Addressed Today: Morbid obesity (HCC) BMI 51.39 today with multiple comorbidities including dyslipidemia and sleep apnea as well as hypertension.  Patient does meet medical criteria for pharmacologic treatment for his obesity.  We discussed treatment options.  Will start Zepbound  2.5 mg weekly.  Discussed potential side effects.  He will follow-up with us  in a few weeks and we can adjust the dose as tolerated.  He will also continue working on lifestyle interventions.  GERD without esophagitis Stable on omeprazole  40 mg daily.  Essential hypertension Blood pressure at goal today on amlodipine  10 mg daily and valsartan  HCTZ 325-25 once daily.  OSA on CPAP On CPAP though we are starting Zepbound  as above.  Dyslipidemia On Lipitor 20 mg daily.  Will continue this.  Recheck lipids in 3 months.  Gout Follows with sports medicine for this.  On allopurinol  300 mg daily.  We can check uric acid level at next office visit.  Also has colchicine  to use as needed.  Sciatica Follows with sports medicine for this as well and takes gabapentin  as needed.  Recurrent depression Mood very well-controlled.  He did have a suicide attempt last year and has seen psychiatry however he is not currently on any medications and is doing very well.  He has stress management techniques that he is working on at home.  He will let us  know if he needs any further assistance with this or if he does notice any worsening mood  Recurrent sinusitis Patient with current symptoms suggestive of sinusitis.  This has been a recurrent issue for him at least once or twice per year.  No red flag signs or symptoms.  Will send a pocket prescription for azithromycin  as he typically does well with this.  Migraine Overall him's are manageable on Excedrin as needed.  Usually takes this about twice per month.  Do not think he  needs any preventative medication at this point though he will let us  know if he has any change or worsening symptoms.  Prostatitis We also reviewed his history of recurrent prostatitis.  Usually receives Bactrim  as needed for flareups.  Not currently having any symptoms though he will let us  know if he develops any issues between now and our next visit.  Preventative health care Flu and pneumonia vaccines given today.  Return for CPE in 3 months.     Subjective:  HPI:  See assessment / plan for status of chronic conditions.   Discussed the use of AI scribe software for clinical note transcription with the patient, who gave verbal consent to proceed.  History of Present Illness Joshua Parrish is a 48 year old male who presents with concerns about weight management and medication refills.  He has experienced weight gain despite previous bodybuilding experience and current efforts to manage his weight through diet and exercise. He uses supplements such as fat burners and creatine, but these have not been effective. He has tried to contact [employer] for [employer] classes but found them difficult to access. He is interested in exploring medication options for weight management.  He has a history of sleep apnea and uses a CPAP machine. Despite dietary changes, including increased vegetable intake and reduced consumption of unhealthy foods, his weight continues to increase. He is interested in medication options that might aid in weight loss and potentially improve his sleep apnea symptoms.  His hypertension is managed with amlodipine  10 mg daily and valsartan  HCTZ 320/25 mg. He had a previous episode of extremely high blood pressure, which has since been controlled with medication. He notes occasional swelling in his hands.  He has hyperlipidemia, managed with atorvastatin  20 mg daily, and gastroesophageal reflux disease, managed with omeprazole . He experiences migraines approximately  twice a month, which he attributes to stress, and manages these episodes with Excedrin Migraine and rest in a dark room.  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 He uses gabapentin  as needed for nerve pain related to a past neck injury.  He reports a history of prostatitis, which he recognizes by symptoms of burning urination and fever, typically requiring antibiotic treatment during flare-ups. He experiences sinus infections frequently, which he manages with a Z-Pak. He currently feels pressure and tingling in his sinuses, indicating a possible infection.  He has a history of mental health issues, including a past suicide attempt. He manages stress and anxiety through video games and avoiding social media. He reports feeling stressed but does not currently experience suicidal thoughts. He inquires about potential blood tests for cancer screening due to a family history of cancer, including colon and breast cancer in men and various cancers in women.  ROS: Per HPI, otherwise a complete review of systems was negative.   PMH:  The following were reviewed and entered/updated in epic: Past Medical History:  Diagnosis Date   Anxiety    Chicken pox    Depression    Fatty liver    GERD (gastroesophageal reflux disease)    H/O hernia repair 03/31/2022   Hypertension    Migraines    Osteoarthritis of shoulder    left   Rotator cuff impingement syndrome of left shoulder 06/19/2015   Sinusitis    Sleep apnea    Patient Active Problem List   Diagnosis Date Noted   Dyslipidemia 08/15/2024   Gout 08/15/2024   Sciatica 08/15/2024   Recurrent depression 08/15/2024   Recurrent sinusitis 08/15/2024   Migraine 08/15/2024   Prostatitis 08/15/2024   Morbid obesity (HCC) 01/09/2022   OSA on CPAP 01/09/2022   Essential hypertension 01/16/2017   GERD without esophagitis 12/25/2016   Past  Surgical History:  Procedure Laterality Date   APPENDECTOMY     CERVICAL LAMINECTOMY     has plates and fusion   CHOLECYSTECTOMY N/A 12/24/2015   Procedure: LAPAROSCOPIC CHOLECYSTECTOMY ;  Surgeon: Camellia Blush, MD;  Location: [facility];  Service: General;  Laterality: N/A;   CLAVICLE EXCISION Left    HYPOSPADIAS CORRECTION     as child   KNEE SURGERY Bilateral    multiple   SHOULDER ARTHROSCOPY WITH ROTATOR CUFF REPAIR Left 06/26/2015   Procedure: SHOULDER ARTHROSCOPY WITH ROTATOR CUFF REPAIR;  Surgeon: Lamar Millman, MD;  Location: [facility];  Service: Orthopedics;  Laterality: Left;    Family History  Problem Relation Age of Onset   Arthritis Mother    Ovarian cancer Mother    Alcohol abuse Father    Suicidality Father    Breast cancer Maternal Uncle    Breast cancer Maternal Grandmother    Cancer Maternal Grandmother        spinal surgery   Cancer Other    Hypertension Other    Diabetes Other    Colon cancer Neg Hx    Sleep apnea Neg Hx     Medications- reviewed  and updated Current Outpatient Medications  Medication Sig Dispense Refill   albuterol  (VENTOLIN  HFA) 108 (90 Base) MCG/ACT inhaler Inhale 1-2 puffs into the lungs every 6 (six) hours as needed. 8 g 0   allopurinol  (ZYLOPRIM ) 300 MG tablet Take 1 tablet (300 mg total) by mouth daily. 90 tablet 3   atorvastatin  (LIPITOR) 20 MG tablet TAKE 1 TABLET BY MOUTH EVERY DAY 90 tablet 0   azithromycin  (ZITHROMAX ) 250 MG tablet Take 2 tabs day 1, then 1 tab daily 6 each 0   colchicine  0.6 MG tablet Take 1 tablet (0.6 mg total) by mouth daily. 30 tablet 4   gabapentin  (NEURONTIN ) 300 MG capsule Take 1 capsule (300 mg total) by mouth 3 (three) times daily as needed. 90 capsule 5   omeprazole  (PRILOSEC) 40 MG capsule Take 1 capsule (40 mg total) by mouth daily. 90 capsule 3   tirzepatide  (ZEPBOUND ) 2.5 MG/0.5ML Pen Inject 2.5 mg into the skin once a week. 2 mL 0   amLODipine  (NORVASC ) 10 MG tablet Take 1 tablet  (10 mg total) by mouth daily. 90 tablet 3   valsartan -hydrochlorothiazide  (DIOVAN -HCT) 320-25 MG tablet Take 1 tablet by mouth daily. 90 tablet 3   No current facility-administered medications for this visit.    Allergies-reviewed and updated Allergies[1]  Social History   Socioeconomic History   Marital status: Divorced    Spouse name: Not on file   Number of children: 1   Years of education: 12   Highest education level: 12th grade  Occupational History   Occupation: Garage administrator, sports   Tobacco Use   Smoking status: Never   Smokeless tobacco: Current    Types: Chew   Tobacco comments:    form given 11/29/15  Vaping Use   Vaping status: Never Used  Substance and Sexual Activity   Alcohol use: Yes    Alcohol/week: 0.0 standard drinks of alcohol    Comment: occasional   Drug use: No   Sexual activity: Yes  Other Topics Concern   Not on file  Social History Narrative   Fun: Lift weights, hike   Social Drivers of Health   Tobacco Use: High Risk (08/15/2024)   Patient History    Smoking Tobacco Use: Never    Smokeless Tobacco Use: Current    Passive Exposure: Not on file  Financial Resource Strain: Medium Risk (08/15/2024)   Overall Financial Resource Strain (CARDIA)    Difficulty of Paying Living Expenses: Somewhat hard  Food Insecurity: Food Insecurity Present (08/15/2024)   Epic    Worried About Programme Researcher, Broadcasting/film/video in the Last Year: Sometimes true    Ran Out of Food in the Last Year: Never true  Transportation Needs: No Transportation Needs (08/15/2024)   Epic    Lack of Transportation (Medical): No    Lack of Transportation (Non-Medical): No  Physical Activity: Sufficiently Active (08/15/2024)   Exercise Vital Sign    Days of Exercise per Week: 5 days    Minutes of Exercise per Session: 30 min  Stress: Stress Concern Present (08/15/2024)   [employer] of Occupational Health - Occupational Stress Questionnaire    Feeling of Stress: To some extent   Social Connections: Moderately Isolated (08/15/2024)   Social Connection and Isolation Panel    Frequency of Communication with Friends and Family: More than three times a week    Frequency of Social Gatherings with Friends and Family: Once a week    Attends Religious Services: Never    Active  Member of Clubs or Organizations: No    Attends Engineer, Structural: Not on file    Marital Status: Living with partner  Depression (PHQ2-9): Low Risk (08/15/2024)   Depression (PHQ2-9)    PHQ-2 Score: 0  Alcohol Screen: Low Risk (08/15/2024)   Alcohol Screen    Last Alcohol Screening Score (AUDIT): 1  Housing: High Risk (08/15/2024)   Epic    Unable to Pay for Housing in the Last Year: Yes    Number of Times Moved in the Last Year: Not on file    Homeless in the Last Year: No  Utilities: Not At Risk (12/19/2022)   [facility] Utilities    Threatened with loss of utilities: No  Health Literacy: Not on file          Objective:  Physical Exam: BP 132/80   Pulse 96   Ht 5' 8 (1.727 m)   SpO2 96%   BMI 51.39 kg/m   Gen: No acute distress, resting comfortably CV: Regular rate and rhythm with no murmurs appreciated Pulm: Normal work of breathing, clear to auscultation bilaterally with no crackles, wheezes, or rhonchi Neuro: Grossly normal, moves all extremities Psych: Normal affect and thought content      Tarisa Paola M. Kennyth, MD 08/15/2024 2:27 PM     [1]  Allergies Allergen Reactions   Silicone Rash    Dermabond   Atarax  [Hydroxyzine ]     Patient notes made anxiety worse-   Coconut Oil Diarrhea, Nausea And Vomiting and Other (See Comments)   Lisinopril  Cough

## 2024-08-15 NOTE — Assessment & Plan Note (Signed)
 BMI 51.39 today with multiple comorbidities including dyslipidemia and sleep apnea as well as hypertension.  Patient does meet medical criteria for pharmacologic treatment for his obesity.  We discussed treatment options.  Will start Zepbound  2.5 mg weekly.  Discussed potential side effects.  He will follow-up with us  in a few weeks and we can adjust the dose as tolerated.  He will also continue working on lifestyle interventions.

## 2024-08-16 ENCOUNTER — Telehealth: Payer: Self-pay | Admitting: Family Medicine

## 2024-08-16 NOTE — Telephone Encounter (Signed)
 ERROR PT CB AND GOT SENT OVER TO [facility]

## 2024-08-16 NOTE — Telephone Encounter (Signed)
 Unable to talk to patient, patient outside area  Advise to call us  back when in area with service

## 2024-08-16 NOTE — Telephone Encounter (Signed)
 Patient stated insurance will cover medication that are not appetite suppressant  Please advise

## 2024-08-17 NOTE — Telephone Encounter (Signed)
 Did we check with the prior authorization team?  He also has OSA which is an FDA approved indication for use of Zepbound 

## 2024-08-18 ENCOUNTER — Other Ambulatory Visit (HOSPITAL_COMMUNITY): Payer: Self-pay

## 2024-08-18 NOTE — Telephone Encounter (Signed)
 Please send a appeal  Patient has OSA which is an FDA approved indication for use of Zepbound 

## 2024-08-18 NOTE — Telephone Encounter (Signed)
 Spoke with patient, he will send a copy of his new card today

## 2024-08-22 ENCOUNTER — Other Ambulatory Visit (HOSPITAL_COMMUNITY): Payer: Self-pay

## 2024-08-22 ENCOUNTER — Telehealth: Payer: Self-pay

## 2024-08-22 NOTE — Telephone Encounter (Signed)
 Pharmacy Patient Advocate Encounter   Received notification from Physician's Office that prior authorization for Zepbound  2.5MG /0.5ML pen-injectors is required/requested.   Insurance verification completed.   The patient is insured through [facility].   Per test claim: PA required; PA submitted to above mentioned insurance via Latent Key/confirmation #/EOC Zepbound  2.5MG /0.5ML pen-injectors Status is pending

## 2024-08-22 NOTE — Telephone Encounter (Signed)
 New insurance placed in patient chart

## 2024-08-23 NOTE — Telephone Encounter (Signed)
 If insurance has declined the appeal then there isn't anything else we can do at this point. We can try again next year or if something changes with his insurance.

## 2024-08-23 NOTE — Telephone Encounter (Signed)
 Pharmacy Patient Advocate Encounter  Received notification from [facility] that Prior Authorization for Zepbound  2.5MG /0.5ML pen-injectors  has been CANCELLED due to    PA #/Case ID/Reference #: [id number]

## 2024-08-23 NOTE — Telephone Encounter (Addendum)
 Please advise on how to proceed with PA being cancelled due to insurance max out and or not on formulary . Please and thank you. Patient notified via MyChart.

## 2024-08-24 NOTE — Telephone Encounter (Signed)
 Patient notified of Dr. Carroll note via MyChart

## 2024-08-26 ENCOUNTER — Other Ambulatory Visit: Payer: Self-pay | Admitting: Family Medicine

## 2024-09-01 ENCOUNTER — Other Ambulatory Visit: Payer: Self-pay | Admitting: Family Medicine

## 2024-09-04 ENCOUNTER — Encounter: Payer: Self-pay | Admitting: Family Medicine

## 2024-09-06 NOTE — Telephone Encounter (Signed)
Please schedule an appt with PCP   °

## 2024-09-08 ENCOUNTER — Encounter: Payer: Self-pay | Admitting: Family Medicine

## 2024-09-08 ENCOUNTER — Ambulatory Visit: Admitting: Family Medicine

## 2024-09-08 VITALS — BP 110/70 | HR 79 | Temp 98.1°F | Ht 68.0 in | Wt 337.8 lb

## 2024-09-08 DIAGNOSIS — N643 Galactorrhea not associated with childbirth: Secondary | ICD-10-CM

## 2024-09-08 DIAGNOSIS — M2550 Pain in unspecified joint: Secondary | ICD-10-CM | POA: Insufficient documentation

## 2024-09-08 DIAGNOSIS — E291 Testicular hypofunction: Secondary | ICD-10-CM | POA: Diagnosis not present

## 2024-09-08 DIAGNOSIS — B360 Pityriasis versicolor: Secondary | ICD-10-CM | POA: Insufficient documentation

## 2024-09-08 DIAGNOSIS — I10 Essential (primary) hypertension: Secondary | ICD-10-CM | POA: Diagnosis not present

## 2024-09-08 LAB — COMPREHENSIVE METABOLIC PANEL WITH GFR
ALT: 38 U/L (ref 3–53)
AST: 26 U/L (ref 5–37)
Albumin: 4.3 g/dL (ref 3.5–5.2)
Alkaline Phosphatase: 73 U/L (ref 39–117)
BUN: 13 mg/dL (ref 6–23)
CO2: 29 meq/L (ref 19–32)
Calcium: 9 mg/dL (ref 8.4–10.5)
Chloride: 100 meq/L (ref 96–112)
Creatinine, Ser: 0.84 mg/dL (ref 0.40–1.50)
GFR: 102.51 mL/min
Glucose, Bld: 104 mg/dL — ABNORMAL HIGH (ref 70–99)
Potassium: 3.8 meq/L (ref 3.5–5.1)
Sodium: 136 meq/L (ref 135–145)
Total Bilirubin: 0.5 mg/dL (ref 0.2–1.2)
Total Protein: 7.1 g/dL (ref 6.0–8.3)

## 2024-09-08 LAB — CBC
HCT: 40.4 % (ref 39.0–52.0)
Hemoglobin: 13.5 g/dL (ref 13.0–17.0)
MCHC: 33.5 g/dL (ref 30.0–36.0)
MCV: 88.4 fl (ref 78.0–100.0)
Platelets: 263 K/uL (ref 150.0–400.0)
RBC: 4.58 Mil/uL (ref 4.22–5.81)
RDW: 14.3 % (ref 11.5–15.5)
WBC: 4.8 K/uL (ref 4.0–10.5)

## 2024-09-08 LAB — TSH: TSH: 1.57 u[IU]/mL (ref 0.35–5.50)

## 2024-09-08 LAB — TESTOSTERONE: Testosterone: 151.82 ng/dL — ABNORMAL LOW (ref 300.00–890.00)

## 2024-09-08 MED ORDER — TRIAMCINOLONE ACETONIDE 0.5 % EX CREA: 1.0000 | TOPICAL_CREAM | Freq: Two times a day (BID) | CUTANEOUS | 0 refills | Status: AC

## 2024-09-08 MED ORDER — KETOCONAZOLE 2 % EX CREA: 1.0000 | TOPICAL_CREAM | Freq: Every day | CUTANEOUS | 0 refills | Status: AC

## 2024-09-08 MED ORDER — FLUCONAZOLE 150 MG PO TABS: 300.0000 mg | ORAL_TABLET | ORAL | 0 refills | Status: AC

## 2024-09-08 NOTE — Assessment & Plan Note (Signed)
 Pressure at goal today on amlodipine  10 mg daily and valsartan  HCTZ 3 25-25 once daily.

## 2024-09-08 NOTE — Assessment & Plan Note (Signed)
 Patient with joint pain specifically in hips and knees.  Not fully addressed today due to more pressing concerns above though did discuss with patient that he likely does have some underlying osteoarthritis.  Recommended over-the-counter Tylenol  as needed.  Also discussed home exercises and continue work on weight loss.  He will follow-up with sports medicine if symptoms worsen.

## 2024-09-08 NOTE — Assessment & Plan Note (Signed)
 Insurance would not pay for GLP or any other appetite suppressants.  He is working on lifestyle interventions.

## 2024-09-08 NOTE — Assessment & Plan Note (Signed)
 Patient previously on testosterone  replacement that was managed by urology but has been off of all testosterone  replacement for the last year or so.  Will recheck labs today.  He would like for us  to take over management if needed.  He prefers injections over topical therapy.

## 2024-09-08 NOTE — Assessment & Plan Note (Signed)
 Patient with recurrent flareups of tinea versicolor.  Typically not well-controlled with topical therapies.  Will start 2-week course of fluconazole .  He will let us  know if not improving.

## 2024-09-08 NOTE — Patient Instructions (Addendum)
 It was very nice to see you today!  VISIT SUMMARY: You visited us  today due to left breast pain and discharge, skin spots, concerns about low testosterone , joint pain, and weight management. We have ordered tests and prescribed medications to address these issues.  YOUR PLAN: LEFT BREAST PAIN AND NIPPLE DISCHARGE: You have been experiencing pain and discharge from your left breast, along with skin irritation. -We have ordered blood work to check your hormone levels, including prolactin. -A mammogram and ultrasound of your left breast have been scheduled. -Apply ketoconazole  cream and triamcinolone  cream to the affected area twice daily.  TINEA VERSICOLOR: You have recurrent skin spots that are likely due to a fungal infection. -Fluconazole  (Diflucan ) is prescribed for treatment. -Apply ketoconazole  cream to the affected areas.  MALE HYPOGONADISM: You are concerned about low testosterone  levels and prefer injections over topical gel. -Blood work is ordered to assess your testosterone  levels. -We will discuss testosterone  replacement therapy options based on your test results.  MORBID OBESITY: You are working on weight loss and have recently lost seven pounds. -Continue your current diet and exercise regimen. -We will revisit potential medication coverage with insurance changes in July.  ARTHRALGIA: You have joint pain, particularly in your hips, likely related to arthritis and obesity. -Continue with weight loss and exercise to alleviate pressure on your joints. -Use Tylenol  occasionally for pain management.  Return if symptoms worsen or fail to improve.   Take care, Dr Kennyth  PLEASE NOTE:  If you had any lab tests, please let us  know if you have not heard back within a few days. You may see your results on mychart before we have a chance to review them but we will give you a call once they are reviewed by us .   If we ordered any referrals today, please let us  know if you have not  heard from their office within the next week.   If you had any urgent prescriptions sent in today, please check with the pharmacy within an hour of our visit to make sure the prescription was transmitted appropriately.   Please try these tips to maintain a healthy lifestyle:  Eat at least 3 REAL meals and 1-2 snacks per day.  Aim for no more than 5 hours between eating.  If you eat breakfast, please do so within one hour of getting up.   Each meal should contain half fruits/vegetables, one quarter protein, and one quarter carbs (no bigger than a computer mouse)  Cut down on sweet beverages. This includes juice, soda, and sweet tea.   Drink at least 1 glass of water with each meal and aim for at least 8 glasses per day  Exercise at least 150 minutes every week.

## 2024-09-08 NOTE — Progress Notes (Signed)
 "  Joshua Parrish is a 49 y.o. male who presents today for an office visit.  Assessment/Plan:  New/Acute Problems: Left breast pain with galactorrhea Patient with a few weeks of left nipple pain and possible galactorrhea.  Exam today shows inflamed and irritated nipple with small amount of crusting though no overt galactorrhea or palpable masses on exam.  Does have a family history of breast cancer in his grandfather.  We will check labs today including prolactin, TSH, testosterone , and estrogen.  Will also check diagnostic mammogram and ultrasound.  It is possible that this may be related to his tinea versicolor or general dermatitis.  Will treat with topical ketoconazole  and triamcinolone  though did discuss with patient we need to rule out more serious etiologies as above as well.  He is agreeable to this plan.  We discussed reasons to return to care.  Chronic Problems Addressed Today: Tinea versicolor Patient with recurrent flareups of tinea versicolor.  Typically not well-controlled with topical therapies.  Will start 2-week course of fluconazole .  He will let us  know if not improving.  Male hypogonadism Patient previously on testosterone  replacement that was managed by urology but has been off of all testosterone  replacement for the last year or so.  Will recheck labs today.  He would like for us  to take over management if needed.  He prefers injections over topical therapy.  Morbid obesity (HCC) Insurance would not pay for GLP or any other appetite suppressants.  He is working on lifestyle interventions.  Essential hypertension Pressure at goal today on amlodipine  10 mg daily and valsartan  HCTZ 3 25-25 once daily.  Arthralgia Patient with joint pain specifically in hips and knees.  Not fully addressed today due to more pressing concerns above though did discuss with patient that he likely does have some underlying osteoarthritis.  Recommended over-the-counter Tylenol  as needed.  Also  discussed home exercises and continue work on weight loss.  He will follow-up with sports medicine if symptoms worsen.     Subjective:  HPI:  See assessment / plan for status of chronic conditions.    Discussed the use of AI scribe software for clinical note transcription with the patient, who gave verbal consent to proceed.  History of Present Illness Joshua Parrish is a 49 year old male who presents with left breast pain and discharge.  He has been experiencing pain and discharge from the left breast for the past two weeks. The pain is described as a tingling and itching sensation, severe enough to make him want to 'cut my nipple off.' His partner noticed a crust on the nipple, and he mentions tenderness and two different colors present, although he cannot distinguish them himself. He has a family history of breast cancer, with his grandfather having had the condition, which required a mastectomy.  He reports having spots on his skin, which he describes as a fungal issue causing discoloration. He has previously taken fluconazole  (Diflucan ) that resolved the issue temporarily. 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  He mentions a concern about low testosterone  levels, as he has not been on any testosterone  treatment for over a year. He previously used injections but has not been monitored for testosterone  levels since then.  He reports joint pain, particularly in the hips, which he notices when trying to bring his leg up. He is working on weight loss and has lost seven  pounds recently, attributing it to eating less and being more active.         Objective:  Physical Exam: BP 110/70   Pulse 79   Temp 98.1 F (36.7 C) (Temporal)   Ht 5' 8 (1.727 m)   Wt (!) 337 lb 12.8 oz (153.2 kg)   SpO2 98%   BMI 51.36 kg/m   Wt Readings from Last 3 Encounters:  09/08/24 (!) 337 lb 12.8 oz (153.2  kg)  08/09/24 (!) 338 lb (153.3 kg)  06/09/24 (!) 331 lb (150.1 kg)    Gen: No acute distress, resting comfortably CV: Regular rate and rhythm with no murmurs appreciated Pulm: Normal work of breathing, clear to auscultation bilaterally with no crackles, wheezes, or rhonchi Chest: No obvious deformities.  Left breast notable for irritation and dry skin along nipple though no noted discharge or palpable masses.  Left nipple.  Tender to palpation Skin: Scattered hyperpigmented rash on torso including chest and back consistent with tinea versicolor Neuro: Grossly normal, moves all extremities Psych: Normal affect and thought content      Joshua Corvino M. Kennyth, MD 09/08/2024 8:04 AM  "

## 2024-09-12 LAB — ESTROGENS, TOTAL: Estrogen: 170 pg/mL

## 2024-09-12 LAB — PROLACTIN: Prolactin: 10.1 ng/mL (ref 2.0–18.0)

## 2024-09-13 ENCOUNTER — Ambulatory Visit: Payer: Self-pay | Admitting: Family Medicine

## 2024-09-13 DIAGNOSIS — R7989 Other specified abnormal findings of blood chemistry: Secondary | ICD-10-CM

## 2024-09-13 NOTE — Progress Notes (Signed)
"   his testosterone  is low.  He can come back here in a week or 2 to recheck.  Insurance will require this before we take over management for his testosterone  replacement however I would like for him to get the mammogram first before we start him on any testosterone .  All of his other labs are normal. "

## 2024-09-14 ENCOUNTER — Other Ambulatory Visit: Payer: Self-pay | Admitting: Family Medicine

## 2024-09-14 DIAGNOSIS — N643 Galactorrhea not associated with childbirth: Secondary | ICD-10-CM

## 2024-09-23 ENCOUNTER — Other Ambulatory Visit (INDEPENDENT_AMBULATORY_CARE_PROVIDER_SITE_OTHER)

## 2024-09-23 DIAGNOSIS — R7989 Other specified abnormal findings of blood chemistry: Secondary | ICD-10-CM

## 2024-09-23 LAB — TESTOSTERONE: Testosterone: 162.15 ng/dL — ABNORMAL LOW (ref 300.00–890.00)

## 2024-09-27 ENCOUNTER — Ambulatory Visit: Payer: Self-pay | Admitting: Family Medicine

## 2024-09-27 NOTE — Progress Notes (Signed)
 Testosterone  confirmed low. We can take over management for this but per my previous message, we need to get his mammogram performed first.

## 2024-10-05 ENCOUNTER — Ambulatory Visit
Admission: RE | Admit: 2024-10-05 | Discharge: 2024-10-05 | Disposition: A | Source: Ambulatory Visit | Attending: Family Medicine | Admitting: Family Medicine

## 2024-10-05 DIAGNOSIS — N643 Galactorrhea not associated with childbirth: Secondary | ICD-10-CM

## 2024-10-06 ENCOUNTER — Ambulatory Visit: Payer: Self-pay | Admitting: Family Medicine

## 2024-10-06 NOTE — Progress Notes (Signed)
 His mammogram showed enlarged breast tissue in both his breast but no signs of breast cancer.  Recommend he come here to discuss starting testosterone  replacement as the radiologist recommended repeating ultrasound in his left breast if symptoms do not improve with testosterone  replacement.

## 2024-10-07 ENCOUNTER — Encounter: Payer: Self-pay | Admitting: Family Medicine

## 2024-10-07 ENCOUNTER — Ambulatory Visit: Admitting: Family Medicine

## 2024-10-07 VITALS — BP 120/80 | HR 92 | Temp 97.5°F | Ht 68.0 in

## 2024-10-07 DIAGNOSIS — N62 Hypertrophy of breast: Secondary | ICD-10-CM | POA: Insufficient documentation

## 2024-10-07 DIAGNOSIS — E291 Testicular hypofunction: Secondary | ICD-10-CM

## 2024-10-07 MED ORDER — "BD HYPODERMIC NEEDLE 25G X 1-1/2"" MISC": 0 refills | Status: AC

## 2024-10-07 MED ORDER — SYRINGE (DISPOSABLE) 3 ML MISC: 0 refills | Status: AC

## 2024-10-07 MED ORDER — "BD BLUNT FILL NEEDLE 18G X 1"" MISC": 0 refills | Status: AC

## 2024-10-07 MED ORDER — TESTOSTERONE CYPIONATE 200 MG/ML IM SOLN: 100.0000 mg | INTRAMUSCULAR | 0 refills | Status: AC

## 2024-10-07 NOTE — Assessment & Plan Note (Signed)
 Reviewed his recent labs which were notable for low testosterone  x 2.  He was previously on testosterone  replacement about a year or so ago but has since discontinued.  He would like to restart.  He is having additional symptoms of low testosterone  including gynecomastia as below, fatigue, and mood changes.  Will restart testosterone  cypionate 100 mg weekly.  He is not sure which dose he was on most recently does know that he was taking an injection weekly.  He is aware of potential side effects and risk and benefits of testosterone  replacement.  We did discuss topical therapy however he declined.  We will send prescription in for him today.  He will come back in a couple of months for CPE and we can recheck labs at that time including CMET, CBC, PSA, and testosterone .

## 2024-10-07 NOTE — Patient Instructions (Addendum)
 It was very nice to see you today!  VISIT SUMMARY: Today we discussed your testosterone  therapy and mammogram results. You have gynecomastia but no signs of breast cancer. We will resume your testosterone  injections to help with your symptoms.  YOUR PLAN: MALE HYPOGONADISM: Your testosterone  levels are low, and previous therapy improved your symptoms. -Resume testosterone  injections at 100 mg weekly. -Prescription for syringes and needles, including an 18 gauge needle for drawing up and a 25 gauge needle for injection. -Follow up in April to recheck levels and adjust dosage if necessary.  GYNECOMASTIA: You have enlarged breast tissue without cancer, and previous testosterone  therapy reduced breast development. -Monitor for changes in breast tissue, including increased size, swelling, or nipple discharge. -Encourage weight training and a healthy diet.  Return for Annual Physical.   Take care, Dr Kennyth  PLEASE NOTE:  If you had any lab tests, please let us  know if you have not heard back within a few days. You may see your results on mychart before we have a chance to review them but we will give you a call once they are reviewed by us .   If we ordered any referrals today, please let us  know if you have not heard from their office within the next week.   If you had any urgent prescriptions sent in today, please check with the pharmacy within an hour of our visit to make sure the prescription was transmitted appropriately.   Please try these tips to maintain a healthy lifestyle:  Eat at least 3 REAL meals and 1-2 snacks per day.  Aim for no more than 5 hours between eating.  If you eat breakfast, please do so within one hour of getting up.   Each meal should contain half fruits/vegetables, one quarter protein, and one quarter carbs (no bigger than a computer mouse)  Cut down on sweet beverages. This includes juice, soda, and sweet tea.   Drink at least 1 glass of water with each  meal and aim for at least 8 glasses per day  Exercise at least 150 minutes every week.

## 2024-10-07 NOTE — Assessment & Plan Note (Signed)
 Patient's recent mammogram and ultrasound showed gynecomastia without any concerning signs for breast cancer or malignancy.  He does note that he had this previously which resolved with testosterone  replacement.  We are restarting testosterone  as above.  The radiologist did recommend that he follow-up for repeat examination if his gynecomastia does not improve with testosterone  or if he develops any worsening symptoms.

## 2024-10-07 NOTE — Progress Notes (Signed)
" ° °  Joshua Parrish is a 49 y.o. male who presents today for an office visit.  Assessment/Plan:  Chronic Problems Addressed Today: Male hypogonadism Reviewed his recent labs which were notable for low testosterone  x 2.  He was previously on testosterone  replacement about a year or so ago but has since discontinued.  He would like to restart.  He is having additional symptoms of low testosterone  including gynecomastia as below, fatigue, and mood changes.  Will restart testosterone  cypionate 100 mg weekly.  He is not sure which dose he was on most recently does know that he was taking an injection weekly.  He is aware of potential side effects and risk and benefits of testosterone  replacement.  We did discuss topical therapy however he declined.  We will send prescription in for him today.  He will come back in a couple of months for CPE and we can recheck labs at that time including CMET, CBC, PSA, and testosterone .  Gynecomastia Patient's recent mammogram and ultrasound showed gynecomastia without any concerning signs for breast cancer or malignancy.  He does note that he had this previously which resolved with testosterone  replacement.  We are restarting testosterone  as above.  The radiologist did recommend that he follow-up for repeat examination if his gynecomastia does not improve with testosterone  or if he develops any worsening symptoms.     Subjective:  HPI:  See assessment / plan for status of chronic conditions.    Discussed the use of AI scribe software for clinical note transcription with the patient, who gave verbal consent to proceed.  History of Present Illness Joshua Parrish is a 49 year old male who presents for follow-up on testosterone  therapy and mammogram results.  He has gynecomastia, confirmed by a recent mammogram, which showed no signs of breast cancer.  He has a history of testosterone  therapy, which he reports helped with breast development. He is interested in resuming  testosterone  injections, preferring weekly injections as he is comfortable with self-administration. He previously used a larger needle to draw the medication and a smaller needle for injection. He recalls using testosterone  injections weekly, which he found effective in maintaining stable testosterone  levels.  He feels sluggish and attributes this to low testosterone  levels. He has experienced mood changes, feeling moodier when not on testosterone . He also has difficulty sleeping and wakes up multiple times at night, which he monitors using a CPAP machine and an app on his phone.  He has been taking over-the-counter testosterone  supplements without significant benefit. He is eager to resume testosterone  therapy to help with energy levels, muscle mass, and weight loss. He prefers a high-protein diet, primarily consuming chicken, pork chops, and ribeye steak, and wants to lose weight.  He has a history of using steroids and is familiar with the process of administering injections. He mentions a past experience in a psychiatric ward where he had to advocate for his testosterone  injections, which he considers necessary for his well-being.         Objective:  Physical Exam: BP 120/80   Pulse 92   Temp (!) 97.5 F (36.4 C) (Temporal)   Ht 5' 8 (1.727 m)   SpO2 99%   BMI 51.36 kg/m   Gen: No acute distress, resting comfortably Neuro: Grossly normal, moves all extremities Psych: Normal affect and thought content      Rushil Kimbrell M. Kennyth, MD 10/07/2024 2:47 PM  "

## 2024-12-01 ENCOUNTER — Encounter: Admitting: Family Medicine

## 2025-01-30 ENCOUNTER — Encounter: Admitting: Family Medicine
